# Patient Record
Sex: Female | Born: 1940 | Race: White | Hispanic: No | Marital: Married | Smoking: Never smoker
Health system: Southern US, Community
[De-identification: ages and names within clinical notes are randomized; demographics above are authoritative.]

## PROBLEM LIST (undated history)

## (undated) DIAGNOSIS — D3611 Benign neoplasm of peripheral nerves and autonomic nervous system of face, head, and neck: Secondary | ICD-10-CM

## (undated) DIAGNOSIS — Z923 Personal history of irradiation: Secondary | ICD-10-CM

## (undated) DIAGNOSIS — D126 Benign neoplasm of colon, unspecified: Secondary | ICD-10-CM

## (undated) DIAGNOSIS — M1990 Unspecified osteoarthritis, unspecified site: Secondary | ICD-10-CM

## (undated) DIAGNOSIS — K5790 Diverticulosis of intestine, part unspecified, without perforation or abscess without bleeding: Secondary | ICD-10-CM

## (undated) DIAGNOSIS — K5900 Constipation, unspecified: Secondary | ICD-10-CM

## (undated) DIAGNOSIS — K589 Irritable bowel syndrome without diarrhea: Secondary | ICD-10-CM

## (undated) DIAGNOSIS — Z87442 Personal history of urinary calculi: Secondary | ICD-10-CM

## (undated) DIAGNOSIS — H269 Unspecified cataract: Secondary | ICD-10-CM

## (undated) DIAGNOSIS — K760 Fatty (change of) liver, not elsewhere classified: Secondary | ICD-10-CM

## (undated) DIAGNOSIS — C50919 Malignant neoplasm of unspecified site of unspecified female breast: Secondary | ICD-10-CM

## (undated) DIAGNOSIS — T7840XA Allergy, unspecified, initial encounter: Secondary | ICD-10-CM

## (undated) DIAGNOSIS — I493 Ventricular premature depolarization: Secondary | ICD-10-CM

## (undated) DIAGNOSIS — K219 Gastro-esophageal reflux disease without esophagitis: Secondary | ICD-10-CM

## (undated) DIAGNOSIS — D2320 Other benign neoplasm of skin of unspecified ear and external auricular canal: Secondary | ICD-10-CM

## (undated) DIAGNOSIS — N390 Urinary tract infection, site not specified: Secondary | ICD-10-CM

## (undated) DIAGNOSIS — K317 Polyp of stomach and duodenum: Secondary | ICD-10-CM

## (undated) DIAGNOSIS — K209 Esophagitis, unspecified without bleeding: Secondary | ICD-10-CM

## (undated) DIAGNOSIS — J189 Pneumonia, unspecified organism: Secondary | ICD-10-CM

## (undated) DIAGNOSIS — I499 Cardiac arrhythmia, unspecified: Secondary | ICD-10-CM

## (undated) DIAGNOSIS — M8580 Other specified disorders of bone density and structure, unspecified site: Secondary | ICD-10-CM

## (undated) DIAGNOSIS — R002 Palpitations: Secondary | ICD-10-CM

## (undated) DIAGNOSIS — N200 Calculus of kidney: Secondary | ICD-10-CM

## (undated) DIAGNOSIS — I10 Essential (primary) hypertension: Secondary | ICD-10-CM

## (undated) DIAGNOSIS — E785 Hyperlipidemia, unspecified: Secondary | ICD-10-CM

## (undated) HISTORY — PX: ACOUSTIC NEUROMA RESECTION: SHX5713

## (undated) HISTORY — DX: Polyp of stomach and duodenum: K31.7

## (undated) HISTORY — DX: Esophagitis, unspecified without bleeding: K20.90

## (undated) HISTORY — PX: COLONOSCOPY W/ BIOPSIES: SHX1374

## (undated) HISTORY — DX: Benign neoplasm of colon, unspecified: D12.6

## (undated) HISTORY — DX: Other specified disorders of bone density and structure, unspecified site: M85.80

## (undated) HISTORY — DX: Hyperlipidemia, unspecified: E78.5

## (undated) HISTORY — DX: Benign neoplasm of peripheral nerves and autonomic nervous system of face, head, and neck: D36.11

## (undated) HISTORY — DX: Pneumonia, unspecified organism: J18.9

## (undated) HISTORY — DX: Palpitations: R00.2

## (undated) HISTORY — DX: Allergy, unspecified, initial encounter: T78.40XA

## (undated) HISTORY — PX: DILATION AND CURETTAGE OF UTERUS: SHX78

## (undated) HISTORY — DX: Calculus of kidney: N20.0

## (undated) HISTORY — DX: Constipation, unspecified: K59.00

## (undated) HISTORY — DX: Ventricular premature depolarization: I49.3

## (undated) HISTORY — DX: Essential (primary) hypertension: I10

## (undated) HISTORY — DX: Gastro-esophageal reflux disease without esophagitis: K21.9

## (undated) HISTORY — DX: Irritable bowel syndrome without diarrhea: K58.9

## (undated) HISTORY — PX: SHOULDER SURGERY: SHX246

## (undated) HISTORY — DX: Esophagitis, unspecified: K20.9

## (undated) HISTORY — DX: Unspecified osteoarthritis, unspecified site: M19.90

## (undated) HISTORY — DX: Diverticulosis of intestine, part unspecified, without perforation or abscess without bleeding: K57.90

## (undated) HISTORY — DX: Malignant neoplasm of unspecified site of unspecified female breast: C50.919

## (undated) HISTORY — PX: ESOPHAGOGASTRODUODENOSCOPY: SHX1529

## (undated) HISTORY — DX: Other benign neoplasm of skin of unspecified ear and external auricular canal: D23.20

## (undated) HISTORY — DX: Urinary tract infection, site not specified: N39.0

## (undated) HISTORY — PX: OTHER SURGICAL HISTORY: SHX169

## (undated) HISTORY — DX: Unspecified cataract: H26.9

---

## 1998-03-09 ENCOUNTER — Other Ambulatory Visit: Admission: RE | Admit: 1998-03-09 | Discharge: 1998-03-09 | Payer: Self-pay | Admitting: Gynecology

## 1998-03-15 ENCOUNTER — Other Ambulatory Visit: Admission: RE | Admit: 1998-03-15 | Discharge: 1998-03-15 | Payer: Self-pay | Admitting: Gynecology

## 1999-04-03 ENCOUNTER — Other Ambulatory Visit: Admission: RE | Admit: 1999-04-03 | Discharge: 1999-04-03 | Payer: Self-pay | Admitting: Gynecology

## 1999-09-10 ENCOUNTER — Encounter: Payer: Self-pay | Admitting: Gynecology

## 1999-09-13 ENCOUNTER — Encounter (INDEPENDENT_AMBULATORY_CARE_PROVIDER_SITE_OTHER): Payer: Self-pay | Admitting: Specialist

## 1999-09-13 ENCOUNTER — Ambulatory Visit (HOSPITAL_COMMUNITY): Admission: RE | Admit: 1999-09-13 | Discharge: 1999-09-13 | Payer: Self-pay | Admitting: Gynecology

## 2000-03-09 ENCOUNTER — Encounter: Admission: RE | Admit: 2000-03-09 | Discharge: 2000-03-09 | Payer: Self-pay | Admitting: Gynecology

## 2000-03-09 ENCOUNTER — Encounter: Payer: Self-pay | Admitting: Gynecology

## 2001-03-12 ENCOUNTER — Encounter: Admission: RE | Admit: 2001-03-12 | Discharge: 2001-03-12 | Payer: Self-pay | Admitting: Gynecology

## 2001-03-12 ENCOUNTER — Encounter: Payer: Self-pay | Admitting: Gynecology

## 2001-03-19 ENCOUNTER — Other Ambulatory Visit: Admission: RE | Admit: 2001-03-19 | Discharge: 2001-03-19 | Payer: Self-pay | Admitting: Gynecology

## 2001-04-08 ENCOUNTER — Other Ambulatory Visit: Admission: RE | Admit: 2001-04-08 | Discharge: 2001-04-08 | Payer: Self-pay | Admitting: Gynecology

## 2002-03-14 ENCOUNTER — Encounter: Payer: Self-pay | Admitting: Gynecology

## 2002-03-14 ENCOUNTER — Encounter: Admission: RE | Admit: 2002-03-14 | Discharge: 2002-03-14 | Payer: Self-pay | Admitting: Gynecology

## 2003-01-24 ENCOUNTER — Other Ambulatory Visit: Admission: RE | Admit: 2003-01-24 | Discharge: 2003-01-24 | Payer: Self-pay | Admitting: Gynecology

## 2003-03-17 ENCOUNTER — Encounter: Payer: Self-pay | Admitting: Gynecology

## 2003-03-17 ENCOUNTER — Encounter: Admission: RE | Admit: 2003-03-17 | Discharge: 2003-03-17 | Payer: Self-pay | Admitting: Gynecology

## 2003-12-16 ENCOUNTER — Emergency Department (HOSPITAL_COMMUNITY): Admission: EM | Admit: 2003-12-16 | Discharge: 2003-12-16 | Payer: Self-pay | Admitting: Emergency Medicine

## 2004-01-30 ENCOUNTER — Other Ambulatory Visit: Admission: RE | Admit: 2004-01-30 | Discharge: 2004-01-30 | Payer: Self-pay | Admitting: Gynecology

## 2004-04-11 ENCOUNTER — Encounter: Admission: RE | Admit: 2004-04-11 | Discharge: 2004-04-11 | Payer: Self-pay | Admitting: Gynecology

## 2004-04-16 ENCOUNTER — Encounter: Admission: RE | Admit: 2004-04-16 | Discharge: 2004-04-16 | Payer: Self-pay | Admitting: Gynecology

## 2004-10-24 ENCOUNTER — Ambulatory Visit: Payer: Self-pay | Admitting: Internal Medicine

## 2004-11-04 ENCOUNTER — Ambulatory Visit: Payer: Self-pay | Admitting: Gastroenterology

## 2004-11-26 ENCOUNTER — Encounter: Payer: Self-pay | Admitting: Internal Medicine

## 2004-11-26 ENCOUNTER — Ambulatory Visit: Payer: Self-pay | Admitting: Gastroenterology

## 2004-12-19 ENCOUNTER — Ambulatory Visit: Payer: Self-pay | Admitting: Internal Medicine

## 2004-12-26 ENCOUNTER — Ambulatory Visit: Payer: Self-pay | Admitting: Internal Medicine

## 2005-03-28 ENCOUNTER — Ambulatory Visit: Payer: Self-pay | Admitting: Internal Medicine

## 2005-04-04 ENCOUNTER — Ambulatory Visit: Payer: Self-pay | Admitting: Internal Medicine

## 2005-04-08 ENCOUNTER — Other Ambulatory Visit: Admission: RE | Admit: 2005-04-08 | Discharge: 2005-04-08 | Payer: Self-pay | Admitting: Gynecology

## 2005-04-14 ENCOUNTER — Encounter: Admission: RE | Admit: 2005-04-14 | Discharge: 2005-04-14 | Payer: Self-pay | Admitting: Gynecology

## 2005-06-12 ENCOUNTER — Ambulatory Visit: Payer: Self-pay | Admitting: Internal Medicine

## 2005-06-19 ENCOUNTER — Ambulatory Visit: Payer: Self-pay | Admitting: Internal Medicine

## 2005-08-04 ENCOUNTER — Ambulatory Visit: Payer: Self-pay | Admitting: Internal Medicine

## 2005-09-08 ENCOUNTER — Ambulatory Visit: Payer: Self-pay | Admitting: Internal Medicine

## 2005-09-24 ENCOUNTER — Ambulatory Visit: Payer: Self-pay | Admitting: Internal Medicine

## 2005-11-03 ENCOUNTER — Ambulatory Visit: Payer: Self-pay | Admitting: Internal Medicine

## 2005-11-10 ENCOUNTER — Ambulatory Visit: Payer: Self-pay | Admitting: Internal Medicine

## 2005-12-23 ENCOUNTER — Ambulatory Visit: Payer: Self-pay | Admitting: Internal Medicine

## 2006-04-09 ENCOUNTER — Ambulatory Visit: Payer: Self-pay | Admitting: Internal Medicine

## 2006-04-10 ENCOUNTER — Other Ambulatory Visit: Admission: RE | Admit: 2006-04-10 | Discharge: 2006-04-10 | Payer: Self-pay | Admitting: Gynecology

## 2006-04-14 ENCOUNTER — Other Ambulatory Visit: Admission: RE | Admit: 2006-04-14 | Discharge: 2006-04-14 | Payer: Self-pay | Admitting: Gynecology

## 2006-05-12 ENCOUNTER — Encounter: Admission: RE | Admit: 2006-05-12 | Discharge: 2006-05-12 | Payer: Self-pay | Admitting: Gynecology

## 2006-07-08 ENCOUNTER — Ambulatory Visit: Payer: Self-pay | Admitting: Internal Medicine

## 2006-07-15 ENCOUNTER — Ambulatory Visit: Payer: Self-pay | Admitting: Internal Medicine

## 2006-11-05 ENCOUNTER — Ambulatory Visit: Payer: Self-pay | Admitting: Internal Medicine

## 2007-01-07 ENCOUNTER — Ambulatory Visit: Payer: Self-pay | Admitting: Internal Medicine

## 2007-01-07 LAB — CONVERTED CEMR LAB
ALT: 14 U/L
AST: 20 U/L
Albumin: 3.7 g/dL
Alkaline Phosphatase: 67 U/L
Bilirubin, Direct: 0.4 mg/dL — ABNORMAL HIGH
Calcium: 9.3 mg/dL
Cholesterol: 166 mg/dL
Glucose, Bld: 86 mg/dL
HDL: 38.4 mg/dL — ABNORMAL LOW
LDL Cholesterol: 109 mg/dL — ABNORMAL HIGH
Potassium: 5.3 meq/L — ABNORMAL HIGH
TSH: 3.64 u[IU]/mL
Total Bilirubin: 0.7 mg/dL
Total CHOL/HDL Ratio: 4.3
Total Protein: 6.6 g/dL
Triglycerides: 95 mg/dL
VLDL: 19 mg/dL

## 2007-01-14 ENCOUNTER — Ambulatory Visit: Payer: Self-pay | Admitting: Internal Medicine

## 2007-03-19 ENCOUNTER — Ambulatory Visit: Payer: Self-pay | Admitting: Internal Medicine

## 2007-04-07 DIAGNOSIS — M949 Disorder of cartilage, unspecified: Secondary | ICD-10-CM

## 2007-04-07 DIAGNOSIS — M899 Disorder of bone, unspecified: Secondary | ICD-10-CM | POA: Insufficient documentation

## 2007-04-07 DIAGNOSIS — I10 Essential (primary) hypertension: Secondary | ICD-10-CM | POA: Insufficient documentation

## 2007-04-12 ENCOUNTER — Other Ambulatory Visit: Admission: RE | Admit: 2007-04-12 | Discharge: 2007-04-12 | Payer: Self-pay | Admitting: Gynecology

## 2007-05-14 ENCOUNTER — Encounter: Admission: RE | Admit: 2007-05-14 | Discharge: 2007-05-14 | Payer: Self-pay | Admitting: Gynecology

## 2007-05-24 ENCOUNTER — Ambulatory Visit: Payer: Self-pay | Admitting: Internal Medicine

## 2007-05-24 DIAGNOSIS — M5380 Other specified dorsopathies, site unspecified: Secondary | ICD-10-CM | POA: Insufficient documentation

## 2007-06-28 ENCOUNTER — Ambulatory Visit: Payer: Self-pay | Admitting: Internal Medicine

## 2007-06-28 LAB — CONVERTED CEMR LAB
ALT: 26 U/L
AST: 26 U/L
Albumin: 3.6 g/dL
Alkaline Phosphatase: 71 U/L
Bilirubin, Direct: 0.1 mg/dL
Cholesterol: 158 mg/dL
HDL: 31.4 mg/dL — ABNORMAL LOW
LDL Cholesterol: 103 mg/dL — ABNORMAL HIGH
Total Bilirubin: 0.7 mg/dL
Total CHOL/HDL Ratio: 5
Total Protein: 6.7 g/dL
Triglycerides: 119 mg/dL
VLDL: 24 mg/dL

## 2007-07-01 ENCOUNTER — Ambulatory Visit: Payer: Self-pay | Admitting: Internal Medicine

## 2007-07-01 DIAGNOSIS — E785 Hyperlipidemia, unspecified: Secondary | ICD-10-CM | POA: Insufficient documentation

## 2007-07-29 ENCOUNTER — Ambulatory Visit: Payer: Self-pay | Admitting: Internal Medicine

## 2007-08-03 ENCOUNTER — Telehealth: Payer: Self-pay | Admitting: Internal Medicine

## 2007-10-27 ENCOUNTER — Ambulatory Visit: Payer: Self-pay | Admitting: Internal Medicine

## 2007-10-27 LAB — CONVERTED CEMR LAB
ALT: 22 U/L
AST: 23 U/L
Albumin: 3.7 g/dL
Alkaline Phosphatase: 68 U/L
Bilirubin, Direct: 0.1 mg/dL
Cholesterol: 146 mg/dL
HDL: 32.8 mg/dL — ABNORMAL LOW
LDL Cholesterol: 95 mg/dL
Total Bilirubin: 0.8 mg/dL
Total CHOL/HDL Ratio: 4.5
Total Protein: 6.8 g/dL
Triglycerides: 89 mg/dL
VLDL: 18 mg/dL

## 2007-11-01 ENCOUNTER — Telehealth (INDEPENDENT_AMBULATORY_CARE_PROVIDER_SITE_OTHER): Payer: Self-pay | Admitting: *Deleted

## 2007-11-08 ENCOUNTER — Ambulatory Visit: Payer: Self-pay | Admitting: Internal Medicine

## 2007-11-08 LAB — CONVERTED CEMR LAB
Cholesterol, target level: 200 mg/dL
HDL goal, serum: 40 mg/dL
LDL Goal: 130 mg/dL

## 2008-02-10 ENCOUNTER — Ambulatory Visit: Payer: Self-pay | Admitting: Internal Medicine

## 2008-03-21 ENCOUNTER — Encounter: Payer: Self-pay | Admitting: Internal Medicine

## 2008-05-11 ENCOUNTER — Ambulatory Visit: Payer: Self-pay | Admitting: Internal Medicine

## 2008-05-14 ENCOUNTER — Encounter: Admission: RE | Admit: 2008-05-14 | Discharge: 2008-05-14 | Payer: Self-pay | Admitting: Internal Medicine

## 2008-05-16 ENCOUNTER — Encounter: Admission: RE | Admit: 2008-05-16 | Discharge: 2008-05-16 | Payer: Self-pay | Admitting: Gynecology

## 2008-05-18 ENCOUNTER — Telehealth: Payer: Self-pay | Admitting: Internal Medicine

## 2008-05-18 DIAGNOSIS — M75120 Complete rotator cuff tear or rupture of unspecified shoulder, not specified as traumatic: Secondary | ICD-10-CM | POA: Insufficient documentation

## 2008-05-29 ENCOUNTER — Telehealth: Payer: Self-pay | Admitting: Internal Medicine

## 2008-06-07 ENCOUNTER — Encounter: Payer: Self-pay | Admitting: Internal Medicine

## 2008-06-13 ENCOUNTER — Telehealth (INDEPENDENT_AMBULATORY_CARE_PROVIDER_SITE_OTHER): Payer: Self-pay | Admitting: *Deleted

## 2008-06-23 ENCOUNTER — Encounter: Admission: RE | Admit: 2008-06-23 | Discharge: 2008-07-21 | Payer: Self-pay | Admitting: Orthopedic Surgery

## 2008-07-28 ENCOUNTER — Ambulatory Visit: Payer: Self-pay | Admitting: Internal Medicine

## 2008-08-28 ENCOUNTER — Ambulatory Visit: Payer: Self-pay | Admitting: Internal Medicine

## 2008-08-28 DIAGNOSIS — M1990 Unspecified osteoarthritis, unspecified site: Secondary | ICD-10-CM | POA: Insufficient documentation

## 2008-11-15 ENCOUNTER — Ambulatory Visit: Payer: Self-pay | Admitting: Internal Medicine

## 2008-11-15 LAB — CONVERTED CEMR LAB
ALT: 18 U/L
AST: 21 U/L
Albumin: 3.6 g/dL
Alkaline Phosphatase: 76 U/L
BUN: 19 mg/dL
Bilirubin Urine: NEGATIVE
Bilirubin, Direct: 0.1 mg/dL
Blood in Urine, dipstick: NEGATIVE
CO2: 32 meq/L
Calcium: 9 mg/dL
Chloride: 106 meq/L
Cholesterol: 158 mg/dL
Creatinine, Ser: 0.9 mg/dL
GFR calc Af Amer: 81 mL/min
GFR calc non Af Amer: 67 mL/min
Glucose, Bld: 93 mg/dL
Glucose, Urine, Semiquant: NEGATIVE
HDL: 35.2 mg/dL — ABNORMAL LOW
Ketones, urine, test strip: NEGATIVE
LDL Cholesterol: 102 mg/dL — ABNORMAL HIGH
Nitrite: NEGATIVE
Potassium: 3.7 meq/L
Sodium: 144 meq/L
Specific Gravity, Urine: 1.02
Total Bilirubin: 0.7 mg/dL
Total CHOL/HDL Ratio: 4.5
Total Protein: 6.9 g/dL
Triglycerides: 105 mg/dL
Urobilinogen, UA: 0.2
VLDL: 21 mg/dL
WBC Urine, dipstick: NEGATIVE
pH: 6.5

## 2008-11-22 ENCOUNTER — Telehealth: Payer: Self-pay | Admitting: Internal Medicine

## 2008-11-27 ENCOUNTER — Ambulatory Visit: Payer: Self-pay | Admitting: Internal Medicine

## 2008-11-27 DIAGNOSIS — K219 Gastro-esophageal reflux disease without esophagitis: Secondary | ICD-10-CM | POA: Insufficient documentation

## 2008-11-28 ENCOUNTER — Ambulatory Visit: Payer: Self-pay | Admitting: Internal Medicine

## 2008-11-28 DIAGNOSIS — Z860100 Personal history of colon polyps, unspecified: Secondary | ICD-10-CM | POA: Insufficient documentation

## 2008-11-28 DIAGNOSIS — R1013 Epigastric pain: Secondary | ICD-10-CM | POA: Insufficient documentation

## 2008-11-28 LAB — CONVERTED CEMR LAB
ALT: 19 U/L
AST: 20 U/L
Albumin: 3.8 g/dL
Alkaline Phosphatase: 74 U/L
Basophils Absolute: 0 10*3/uL
Basophils Relative: 0.2 %
Bilirubin, Direct: 0.2 mg/dL
Eosinophils Absolute: 0 10*3/uL
Eosinophils Relative: 0.9 %
HCT: 38.5 %
Hemoglobin: 13.1 g/dL
Lymphocytes Relative: 28 %
MCHC: 34 g/dL
MCV: 88 fL
Monocytes Absolute: 0.4 10*3/uL
Monocytes Relative: 9.1 %
Neutro Abs: 3.1 10*3/uL
Neutrophils Relative %: 61.8 %
Platelets: 212 10*3/uL
RBC: 4.38 M/uL
RDW: 13.2 %
Total Bilirubin: 0.6 mg/dL
Total Protein: 7 g/dL
WBC: 4.9 10*3/uL

## 2008-11-29 ENCOUNTER — Ambulatory Visit: Payer: Self-pay | Admitting: Internal Medicine

## 2008-11-29 ENCOUNTER — Encounter: Payer: Self-pay | Admitting: Internal Medicine

## 2008-12-08 ENCOUNTER — Encounter: Payer: Self-pay | Admitting: Internal Medicine

## 2008-12-25 ENCOUNTER — Ambulatory Visit: Payer: Self-pay | Admitting: Internal Medicine

## 2008-12-26 ENCOUNTER — Ambulatory Visit: Payer: Self-pay | Admitting: Internal Medicine

## 2009-01-08 ENCOUNTER — Telehealth: Payer: Self-pay | Admitting: Internal Medicine

## 2009-01-09 ENCOUNTER — Ambulatory Visit: Payer: Self-pay | Admitting: Internal Medicine

## 2009-03-26 ENCOUNTER — Encounter: Payer: Self-pay | Admitting: Internal Medicine

## 2009-03-26 LAB — CONVERTED CEMR LAB: Pap Smear: NORMAL

## 2009-05-17 ENCOUNTER — Encounter: Admission: RE | Admit: 2009-05-17 | Discharge: 2009-05-17 | Payer: Self-pay | Admitting: Gynecology

## 2009-06-28 ENCOUNTER — Ambulatory Visit: Payer: Self-pay | Admitting: Internal Medicine

## 2009-06-28 LAB — CONVERTED CEMR LAB
ALT: 21 U/L
AST: 23 U/L
Albumin: 3.8 g/dL
Alkaline Phosphatase: 94 U/L
BUN: 14 mg/dL
Bilirubin, Direct: 0 mg/dL
CO2: 33 meq/L — ABNORMAL HIGH
Calcium: 9.3 mg/dL
Chloride: 106 meq/L
Cholesterol: 150 mg/dL
Creatinine, Ser: 0.9 mg/dL
GFR calc non Af Amer: 66.5 mL/min
Glucose, Bld: 102 mg/dL — ABNORMAL HIGH
HDL: 34.5 mg/dL — ABNORMAL LOW
LDL Cholesterol: 95 mg/dL
Potassium: 4.6 meq/L
Sodium: 146 meq/L — ABNORMAL HIGH
Total Bilirubin: 0.9 mg/dL
Total CHOL/HDL Ratio: 4
Total Protein: 7.5 g/dL
Triglycerides: 102 mg/dL
VLDL: 20.4 mg/dL

## 2009-07-05 ENCOUNTER — Ambulatory Visit: Payer: Self-pay | Admitting: Internal Medicine

## 2009-08-20 ENCOUNTER — Telehealth: Payer: Self-pay | Admitting: Internal Medicine

## 2009-08-28 ENCOUNTER — Telehealth: Payer: Self-pay | Admitting: Internal Medicine

## 2009-09-19 ENCOUNTER — Ambulatory Visit: Payer: Self-pay | Admitting: Internal Medicine

## 2009-09-19 DIAGNOSIS — R002 Palpitations: Secondary | ICD-10-CM | POA: Insufficient documentation

## 2009-09-26 ENCOUNTER — Ambulatory Visit: Payer: Self-pay | Admitting: Internal Medicine

## 2009-10-10 ENCOUNTER — Encounter: Payer: Self-pay | Admitting: Internal Medicine

## 2009-10-10 ENCOUNTER — Ambulatory Visit: Payer: Self-pay

## 2009-10-10 ENCOUNTER — Ambulatory Visit (HOSPITAL_COMMUNITY): Admission: RE | Admit: 2009-10-10 | Discharge: 2009-10-10 | Payer: Self-pay | Admitting: Internal Medicine

## 2009-10-10 ENCOUNTER — Ambulatory Visit: Payer: Self-pay | Admitting: Cardiovascular Disease

## 2009-10-29 ENCOUNTER — Telehealth: Payer: Self-pay | Admitting: Internal Medicine

## 2009-11-22 ENCOUNTER — Encounter (INDEPENDENT_AMBULATORY_CARE_PROVIDER_SITE_OTHER): Payer: Self-pay | Admitting: *Deleted

## 2009-12-05 ENCOUNTER — Encounter (INDEPENDENT_AMBULATORY_CARE_PROVIDER_SITE_OTHER): Payer: Self-pay | Admitting: *Deleted

## 2009-12-06 ENCOUNTER — Ambulatory Visit: Payer: Self-pay | Admitting: Internal Medicine

## 2009-12-19 ENCOUNTER — Ambulatory Visit: Payer: Self-pay | Admitting: Internal Medicine

## 2009-12-19 LAB — CONVERTED CEMR LAB
ALT: 17 U/L
AST: 19 U/L
Albumin: 3.7 g/dL
Alkaline Phosphatase: 95 U/L
BUN: 20 mg/dL
Basophils Absolute: 0 10*3/uL
Basophils Relative: 0.1 %
Bilirubin Urine: NEGATIVE
Bilirubin, Direct: 0.1 mg/dL
CO2: 30 meq/L
Calcium: 9.1 mg/dL
Chloride: 106 meq/L
Cholesterol: 128 mg/dL
Creatinine, Ser: 1.2 mg/dL
Eosinophils Absolute: 0 10*3/uL
Eosinophils Relative: 1.1 %
GFR calc non Af Amer: 47.64 mL/min
Glucose, Bld: 89 mg/dL
Glucose, Urine, Semiquant: NEGATIVE
HCT: 39.2 %
HDL: 40.7 mg/dL
Hemoglobin: 12.6 g/dL
Ketones, urine, test strip: NEGATIVE
LDL Cholesterol: 70 mg/dL
Lymphocytes Relative: 28.1 %
Lymphs Abs: 1.2 10*3/uL
MCHC: 32.2 g/dL
MCV: 92.6 fL
Monocytes Absolute: 0.5 10*3/uL
Monocytes Relative: 11.4 %
Neutro Abs: 2.6 10*3/uL
Neutrophils Relative %: 59.3 %
Nitrite: NEGATIVE
Platelets: 188 10*3/uL
Potassium: 4.2 meq/L
Protein, U semiquant: NEGATIVE
RBC: 4.24 M/uL
RDW: 13.6 %
Sodium: 144 meq/L
Specific Gravity, Urine: >=1.03
TSH: 1.86 u[IU]/mL
Total Bilirubin: 0.7 mg/dL
Total CHOL/HDL Ratio: 3
Total Protein: 7.4 g/dL
Triglycerides: 85 mg/dL
Urobilinogen, UA: 0.2
VLDL: 17 mg/dL
WBC Urine, dipstick: NEGATIVE
WBC: 4.3 10*3/uL — ABNORMAL LOW
pH: 5

## 2009-12-26 ENCOUNTER — Ambulatory Visit: Payer: Self-pay | Admitting: Internal Medicine

## 2009-12-26 LAB — HM MAMMOGRAPHY

## 2010-01-01 ENCOUNTER — Ambulatory Visit: Payer: Self-pay | Admitting: Internal Medicine

## 2010-01-01 LAB — HM COLONOSCOPY

## 2010-01-02 ENCOUNTER — Ambulatory Visit: Payer: Self-pay | Admitting: Internal Medicine

## 2010-01-07 ENCOUNTER — Telehealth: Payer: Self-pay | Admitting: Internal Medicine

## 2010-01-31 ENCOUNTER — Telehealth: Payer: Self-pay | Admitting: Internal Medicine

## 2010-03-04 ENCOUNTER — Telehealth: Payer: Self-pay | Admitting: Internal Medicine

## 2010-03-29 ENCOUNTER — Ambulatory Visit: Payer: Self-pay | Admitting: Internal Medicine

## 2010-03-29 DIAGNOSIS — N3020 Other chronic cystitis without hematuria: Secondary | ICD-10-CM | POA: Insufficient documentation

## 2010-04-25 ENCOUNTER — Encounter: Payer: Self-pay | Admitting: Internal Medicine

## 2010-05-28 ENCOUNTER — Encounter: Admission: RE | Admit: 2010-05-28 | Discharge: 2010-05-28 | Payer: Self-pay | Admitting: Gynecology

## 2010-05-30 ENCOUNTER — Encounter: Admission: RE | Admit: 2010-05-30 | Discharge: 2010-05-30 | Payer: Self-pay | Admitting: Gynecology

## 2010-06-11 ENCOUNTER — Encounter: Payer: Self-pay | Admitting: Internal Medicine

## 2010-07-12 ENCOUNTER — Ambulatory Visit: Payer: Self-pay | Admitting: Internal Medicine

## 2010-07-12 LAB — CONVERTED CEMR LAB
ALT: 18 U/L
AST: 19 U/L
Albumin: 3.7 g/dL
Alkaline Phosphatase: 87 U/L
BUN: 17 mg/dL
Bilirubin, Direct: 0.2 mg/dL
CO2: 32 meq/L
Calcium: 8.9 mg/dL
Chloride: 101 meq/L
Cholesterol: 147 mg/dL
Creatinine, Ser: 0.8 mg/dL
GFR calc non Af Amer: 73.81 mL/min
Glucose, Bld: 93 mg/dL
HDL: 38.2 mg/dL — ABNORMAL LOW
LDL Cholesterol: 83 mg/dL
Potassium: 3.8 meq/L
Sodium: 141 meq/L
Total Bilirubin: 0.8 mg/dL
Total CHOL/HDL Ratio: 4
Total Protein: 6.7 g/dL
Triglycerides: 128 mg/dL
VLDL: 25.6 mg/dL

## 2010-07-26 ENCOUNTER — Ambulatory Visit: Payer: Self-pay | Admitting: Internal Medicine

## 2010-09-17 ENCOUNTER — Telehealth: Payer: Self-pay | Admitting: Internal Medicine

## 2010-10-13 DIAGNOSIS — C50919 Malignant neoplasm of unspecified site of unspecified female breast: Secondary | ICD-10-CM | POA: Insufficient documentation

## 2010-10-13 DIAGNOSIS — D493 Neoplasm of unspecified behavior of breast: Secondary | ICD-10-CM | POA: Insufficient documentation

## 2010-10-15 ENCOUNTER — Telehealth: Payer: Self-pay | Admitting: Internal Medicine

## 2010-11-10 LAB — CONVERTED CEMR LAB
ALT: 22 U/L
AST: 22 U/L
Albumin: 4 g/dL
Alkaline Phosphatase: 68 U/L
BUN: 13 mg/dL
BUN: 16 mg/dL
Basophils Absolute: 0 10*3/uL
Basophils Relative: 0.5 %
Bilirubin, Direct: 0.1 mg/dL
CO2: 31 meq/L
CO2: 32 meq/L
Calcium: 9.2 mg/dL
Calcium: 9.4 mg/dL
Chloride: 103 meq/L
Chloride: 104 meq/L
Cholesterol: 159 mg/dL
Creatinine, Ser: 0.9 mg/dL
Creatinine, Ser: 1 mg/dL
Eosinophils Absolute: 0 10*3/uL
Eosinophils Relative: 0.8 %
Free T4: 1 ng/dL
GFR calc Af Amer: 72 mL/min
GFR calc non Af Amer: 59 mL/min
GFR calc non Af Amer: 66.45 mL/min
Glucose, Bld: 96 mg/dL
Glucose, Bld: 99 mg/dL
HCT: 41.6 %
HDL: 35.8 mg/dL — ABNORMAL LOW
Hemoglobin: 13.8 g/dL
LDL Cholesterol: 99 mg/dL
Lymphocytes Relative: 31.9 %
MCHC: 33.3 g/dL
MCV: 91.7 fL
Magnesium: 2.3 mg/dL
Monocytes Absolute: 0.4 10*3/uL
Monocytes Relative: 8.9 %
Neutro Abs: 3 10*3/uL
Neutrophils Relative %: 57.9 %
Platelets: 222 10*3/uL
Potassium: 3.3 meq/L — ABNORMAL LOW
Potassium: 3.9 meq/L
RBC: 4.53 M/uL
RDW: 13 %
Sodium: 143 meq/L
Sodium: 144 meq/L
T3, Free: 3.2 pg/mL
TSH: 2.02 u[IU]/mL
TSH: 2.52 u[IU]/mL
Total Bilirubin: 0.8 mg/dL
Total CHOL/HDL Ratio: 4.4
Total Protein: 7.4 g/dL
Triglycerides: 122 mg/dL
VLDL: 24 mg/dL
WBC: 5 10*3/uL

## 2010-11-12 NOTE — Progress Notes (Signed)
Summary: REFILL REQ  Phone Note Call from Patient   Caller: Patient [phone]) Reason for Call: Refill Medication Summary of Call: Pt called to req that a refill for med (LISINO-HCTZ 20-25MG  TAB) be sent into [facility] # [phone]...Marland KitchenMarland KitchenMarland Kitchen Pt can be reached @  [phone]  or  [phone]  with any questions or concerns.  Initial call taken by: Debbra Riding,  October 29, 2009 8:14 AM    Prescriptions: LISINOPRIL-HYDROCHLOROTHIAZIDE 20-25 MG  TABS (LISINOPRIL-HYDROCHLOROTHIAZIDE) once daily  #90 x 3   Entered by:   Willy Eddy, LPN   Authorized by:   Stacie Glaze MD   Signed by:   Willy Eddy, LPN on 28/41/3244   Method used:   Electronically to        [facility]* (mail-order)             ,          Ph: [phone]       Fax: [phone]   RxID:   [id number]

## 2010-11-12 NOTE — Consult Note (Signed)
 Summary: GOC note  GOC note   Imported By: Doyal Daring 06/09/2008 10:15:58  _____________________________________________________________________  External Attachment:    Type:   Image     Comment:   GOC note

## 2010-11-12 NOTE — Progress Notes (Signed)
 Summary: Atenolol  50 mg.  Phone Note Call from Patient   Caller: Patient Call For: Alexandra FORBES Budge MD Summary of Call: Pt is calling to give Dr. Budge her BP reading.  The lowest BP was 116/55 and lowest pulse 55. States heart has calmed down some.   If to continue same meds, will need refill to [facility]. [phone] Initial call taken by: Marissa Shropshire CMA,  August 28, 2009 9:04 AM  Follow-up for Phone Call        if she is on the 25 mg may  send in with change in the med list ( i beleve the last phone note suggest this but the med list was not changed) Follow-up by: Alexandra FORBES Budge MD,  August 28, 2009 1:54 PM  Additional Follow-up for Phone Call Additional follow up Details #1::        Spoke to pt.  She is on Atenolol  50 mg. and per Dr. Budge.........SABRAneeds to stay on this. Additional Follow-up by: Marissa Shropshire CMA,  August 28, 2009 2:08 PM    Prescriptions: ATENOLOL  50 MG  TABS (ATENOLOL ) once daily  #90 x 3   Entered by:   Marissa Shropshire CMA   Authorized by:   Alexandra FORBES Budge MD   Signed by:   Marissa Shropshire CMA on 08/28/2009   Method used:   Electronically to        [facility]* (mail-order)             ,          Ph: [phone]       Fax: [phone]   RxID:   [id number]

## 2010-11-12 NOTE — Assessment & Plan Note (Signed)
 Summary: 1 MONTH ROV/NJR   Vital Signs:  Patient profile:   70 year old female Height:      66 inches Weight:      180 pounds BMI:     29.16 Temp:     98.4 degrees F oral Pulse rate:   76 / minute Resp:     14 per minute BP sitting:   140 / 80  (left arm)  Vitals Entered By: Kendell CHRISTELLA Pouch, LPN (December 26, 2008 11:34 AM) CC: f/u from gerd- carafate  helped and pt completed- was told to stop by dr avram wh o saw her yesterday for egd an d rx'd her with omeprazole , Hypertension Management Is Patient Diabetic? No Pain Assessment Patient in pain? no        Primary Care Provider:  Mavis  CC:  f/u from gerd- carafate  helped and pt completed- was told to stop by dr avram wh o saw her yesterday for egd an d rx'd her with omeprazole .  History of Present Illness: Saw Dr avram and was placed on the  prilosec  and stopped the carafate  the EGD  and the consultant's note was reviewed ( had GERDWITH GASTRITIS AND ESPHAGITIS)   Hypertension History:      She denies headache, chest pain, palpitations, dyspnea with exertion, orthopnea, PND, peripheral edema, visual symptoms, neurologic problems, syncope, and side effects from treatment.        Positive major cardiovascular risk factors include female age 53 years old or older, hyperlipidemia, and hypertension.  Negative major cardiovascular risk factors include negative family history for ischemic heart disease and non-tobacco-user status.     Problems Prior to Update: 1)  Adenocarcinoma, Colon, Family Hx  (ICD-V16.0) 2)  Colonic Polyps, Adenomatous, Hx of  (ICD-V12.72) 3)  Gerd  (ICD-530.81) 4)  Abdominal Pain, Epigastric  (ICD-789.06) 5)  Gerd  (ICD-530.81) 6)  Osteoarthritis  (ICD-715.90) 7)  Rotator Cuff Tear  (ICD-727.61) 8)  Preventive Health Care  (ICD-V70.0) 9)  Bursitis, Right Shoulder  (ICD-726.10) 10)  Hyperlipidemia  (ICD-272.4) 11)  Hyperlipidemia Nec/nos  (ICD-272.4) 12)  Sprain/strain, Ankle, Calcaneofibular   (ICD-845.02) 13)  Muscle Spasm, Back  (ICD-724.8) 14)  Osteopenia  (ICD-733.90) 15)  Hypertension  (ICD-401.9)  Medications Prior to Update: 1)  Adult Aspirin Ec Low Strength 81 Mg  Tbec (Aspirin) .... Once Daily 2)  Lisinopril-Hydrochlorothiazide  20-25 Mg  Tabs (Lisinopril-Hydrochlorothiazide ) .... Once Daily 3)  Atenolol  50 Mg  Tabs (Atenolol ) .... Once Daily 4)  Zocor  20 Mg  Tabs (Simvastatin ) .... One By Mouth Daily 5)  Liquid Calcium /vitamin D  600-200 Mg-Unit  Caps (Calcium  Carbonate-Vitamin D ) .... 600 Per Day 6)  Trimethoprim  100 Mg  Tabs (Trimethoprim ) .... Once Daily 7)  D 1000 1000 Unit Caps (Cholecalciferol) .... One By Mouth Bid 8)  Omeprazole  20 Mg Cpdr (Omeprazole ) .SABRA.. 1 By Mouth 30 Minutes Before Meals  Current Medications (verified): 1)  Adult Aspirin Ec Low Strength 81 Mg  Tbec (Aspirin) .... Once Daily 2)  Lisinopril-Hydrochlorothiazide  20-25 Mg  Tabs (Lisinopril-Hydrochlorothiazide ) .... Once Daily 3)  Atenolol  50 Mg  Tabs (Atenolol ) .... Once Daily 4)  Zocor  20 Mg  Tabs (Simvastatin ) .... One By Mouth Daily 5)  Liquid Calcium /vitamin D  600-200 Mg-Unit  Caps (Calcium  Carbonate-Vitamin D ) .... 600 Per Day 6)  Trimethoprim  100 Mg  Tabs (Trimethoprim ) .... Once Daily 7)  D 1000 1000 Unit Caps (Cholecalciferol) .... One By Mouth Bid 8)  Omeprazole  20 Mg Cpdr (Omeprazole ) .SABRA.. 1 By Mouth 30 Minutes Before Meals  Allergies (verified): No Known Drug Allergies  Past History:  Family History:    father had Hodgkins    mother had colon cancer    Family History of Diabetes: GM, Aunt    Family History of Colon Polyps: Daughter     (12/25/2008)  Social History:    Married    Never Smoked    Occupation: Semi Retired    Alcohol Use - no    Illicit Drug Use - no    Patient does not get regular exercise.      (11/28/2008)  Risk Factors:    Alcohol Use: N/A    >5 drinks/d w/in last 3 months: N/A    Caffeine Use: N/A    Diet: N/A    Exercise: no  (11/28/2008)  Risk Factors:    Smoking Status: never (05/24/2007)    Packs/Day: N/A    Cigars/wk: N/A    Pipe Use/wk: N/A    Cans of tobacco/wk: N/A    Passive Smoke Exposure: no (11/08/2007)  Past medical, surgical, family and social histories (including risk factors) reviewed, and no changes noted (except as noted below).  Past Medical History:    Reviewed history from 11/28/2008 and no changes required:    Hypertension    Osteopenia    Palpitation    Hyperlipidemia  diet controlled    Osteoarthritis    GERD    Diverticulosis    Kidney Stones    Pneumonia    Urinary Tract Infection  Past Surgical History:    Reviewed history from 12/25/2008 and no changes required:    kidney stones    D and C x 3    shoulder arthroscopy right  Family History:    Reviewed history from 12/25/2008 and no changes required:       father had Hodgkins       mother had colon cancer       Family History of Diabetes: GM, Aunt       Family History of Colon Polyps: Daughter  Social History:    Reviewed history from 11/28/2008 and no changes required:       Married       Never Smoked       Occupation: Semi Retired       Alcohol Use - no       Illicit Drug Use - no       Patient does not get regular exercise.   Review of Systems  The patient denies anorexia, fever, weight loss, weight gain, vision loss, decreased hearing, hoarseness, chest pain, syncope, dyspnea on exertion, peripheral edema, prolonged cough, headaches, hemoptysis, abdominal pain, melena, hematochezia, severe indigestion/heartburn, hematuria, incontinence, genital sores, muscle weakness, suspicious skin lesions, transient blindness, difficulty walking, depression, unusual weight change, abnormal bleeding, enlarged lymph nodes, angioedema, and breast masses.    Physical Exam  General:  Well developed, well nourished, no acute distress. Head:  Normocephalic and atraumatic. Eyes:  PERRLA, no icterus. Ears:  no external  deformities.   Nose:  External nasal examination shows no deformity or inflammation. Nasal mucosa are pink and moist without lesions or exudates. Mouth:  No deformity or lesions, dentition normal. Neck:  Supple; no masses or thyromegaly. Lungs:  Clear throughout to auscultation. Heart:  Regular rate and rhythm; no murmurs, rubs,  or bruits. Abdomen:  Soft, nontender and nondistended. No masses, hepatosplenomegaly or hernias noted. Normal bowel sounds.   Impression & Recommendations:  Problem # 1:  OSTEOPENIA (ICD-733.90)  improved bone density and discussion  of vitamin D  and  calcium   Discussed medication use, applications of heat or ice, and exercises.   Problem # 2:  HYPERLIPIDEMIA (ICD-272.4)  Her updated medication list for this problem includes:    Zocor  20 Mg Tabs (Simvastatin ) ..... One by mouth daily  Labs Reviewed: Chol: 158 (11/15/2008)   HDL: 35.2 (11/15/2008)   LDL: 102 (11/15/2008)   TG: 105 (11/15/2008) SGOT: 20 (11/27/2008)   SGPT: 19 (11/27/2008)  Lipid Goals: Chol Goal: 200 (11/08/2007)   HDL Goal: 40 (11/08/2007)   LDL Goal: 130 (11/08/2007)   TG Goal: 150 (11/08/2007)  10 Yr Risk Heart Disease: 17 % Prior 10 Yr Risk Heart Disease: 13 % (11/27/2008)  Problem # 3:  HYPERTENSION (ICD-401.9) may take an extra 1/2 atenolol  for palpitations Her updated medication list for this problem includes:    Lisinopril-hydrochlorothiazide  20-25 Mg Tabs (Lisinopril-hydrochlorothiazide ) ..... Once daily    Atenolol  50 Mg Tabs (Atenolol ) ..... Once daily  BP today: 140/80 Prior BP: 116/60 (12/25/2008)  10 Yr Risk Heart Disease: 17 % Prior 10 Yr Risk Heart Disease: 13 % (11/27/2008)  Labs Reviewed: Creat: 0.9 (11/15/2008) Chol: 158 (11/15/2008)   HDL: 35.2 (11/15/2008)   LDL: 102 (11/15/2008)   TG: 105 (11/15/2008)  Complete Medication List: 1)  Adult Aspirin Ec Low Strength 81 Mg Tbec (Aspirin) .... Once daily 2)  Lisinopril-hydrochlorothiazide  20-25 Mg Tabs  (Lisinopril-hydrochlorothiazide ) .... Once daily 3)  Atenolol  50 Mg Tabs (Atenolol ) .... Once daily 4)  Zocor  20 Mg Tabs (Simvastatin ) .... One by mouth daily 5)  Liquid Calcium /vitamin D  600-200 Mg-unit Caps (Calcium  carbonate-vitamin d ) .... 600 per day 6)  Trimethoprim  100 Mg Tabs (Trimethoprim ) .... Once daily 7)  D 1000 1000 Unit Caps (Cholecalciferol) .... One by mouth bid 8)  Omeprazole  20 Mg Cpdr (Omeprazole ) .SABRA.. 1 by mouth 30 minutes before meals  Hypertension Assessment/Plan:      The patient's hypertensive risk group is category B: At least one risk factor (excluding diabetes) with no target organ damage.  Her calculated 10 year risk of coronary heart disease is 17 %.  Today's blood pressure is 140/80.  Her blood pressure goal is < 140/90.  Patient Instructions: 1)  may take 1/2 atenolol  for extra heart beats 2)  Please schedule a follow-up appointment in 6 months. 3)  BMP prior to visit, ICD-9:  401.90 4)  Hepatic Panel prior to visit, ICD-9:995.20 5)  Lipid Panel prior to visit, ICD-9:272.4

## 2010-11-12 NOTE — Consult Note (Signed)
 Summary: Dr Cary note  Dr Cary note   Imported By: Doyal Daring 03/24/2008 12:56:59  _____________________________________________________________________  External Attachment:    Type:   Image     Comment:   Dr Cary note

## 2010-11-12 NOTE — Assessment & Plan Note (Signed)
 Summary: SEVERE HEART PALPITATIONS//SLM   Vital Signs:  Patient profile:   70 year old female Height:      66 inches Weight:      177 pounds BMI:     28.67 Temp:     98.3 degrees F oral Pulse rate:   62 / minute Pulse rhythm:   regular Resp:     14 per minute BP sitting:   124 / 80  (left arm)  Vitals Entered By: Kendell CHRISTELLA Pouch, LPN (September 19, 2009 12:04 PM) CC: c/o heart palpatations- frequently after eating breakfast,although she drinks decaf coffee   Primary Care Provider:  Mavis  CC:  c/o heart palpatations- frequently after eating breakfast and although she drinks decaf coffee.  History of Present Illness: Pt with hx of palpitations, never documented. She states that the frequency has slightly increased. the symptoms are never sustained and are not associated with SOB, diaphoresis, nausea, or dizzyness She felt that she had them today and called for an office visit. the EKG today is normal. Of note is that her husband had ablation for AF. 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) 3)  Tick Bite  (ICD-E906.4) 4)  Adenocarcinoma, Colon, Family Hx  (ICD-V16.0) 5)  Colonic Polyps, Adenomatous, Hx of  (ICD-V12.72) 6)  Gerd  (ICD-530.81) 7)  Abdominal Pain, Epigastric  (ICD-789.06) 8)  Gerd  (ICD-530.81) 9)  Osteoarthritis  (ICD-715.90) 10)  Rotator Cuff Tear  (ICD-727.61) 11)  Preventive Health Care  (ICD-V70.0) 12)  Bursitis, Right Shoulder  (ICD-726.10) 13)  Hyperlipidemia  (ICD-272.4) 14)  Hyperlipidemia Nec/nos  (ICD-272.4) 15)  Sprain/strain, Ankle, Calcaneofibular  (ICD-845.02) 16)  Muscle Spasm, Back  (ICD-724.8) 17)  Osteopenia  (ICD-733.90) 18)  Hypertension  (ICD-401.9)  Medications Prior to Update: 1)  Adult Aspirin Ec Low  Strength 81 Mg  Tbec (Aspirin) .... Once Daily 2)  Lisinopril-Hydrochlorothiazide  20-25 Mg  Tabs (Lisinopril-Hydrochlorothiazide ) .... Once Daily 3)  Atenolol  50 Mg  Tabs (Atenolol ) .... Once Daily 4)  Zocor  20 Mg  Tabs (Simvastatin ) .... One By Mouth Daily 5)  Trimethoprim  100 Mg  Tabs (Trimethoprim ) .... Once Daily 6)  Vitamin D  2000 Unit Tabs (Cholecalciferol) .SABRA.. 1 Once Daily 7)  Omeprazole  20 Mg Cpdr (Omeprazole ) .SABRA.. 1 By Mouth 30 Minutes Before Meals  Current Medications (verified): 1)  Adult Aspirin Ec Low Strength 81 Mg  Tbec (Aspirin) .... Once Daily 2)  Lisinopril-Hydrochlorothiazide  20-25 Mg  Tabs (Lisinopril-Hydrochlorothiazide ) .... Once Daily 3)  Atenolol  50 Mg  Tabs (Atenolol ) .... Once Daily 4)  Zocor  20 Mg  Tabs (Simvastatin ) .... One By Mouth Daily 5)  Trimethoprim  100 Mg  Tabs (Trimethoprim ) .... Once Daily 6)  Vitamin D  2000 Unit Tabs (Cholecalciferol) .SABRA.. 1 Once Daily 7)  Omeprazole  20 Mg Cpdr (Omeprazole ) .SABRA.. 1 By Mouth 30 Minutes Before Meals  Allergies (verified): No Known Drug Allergies  Past History:  Family History: Last updated: 12/25/2008 father had Hodgkins mother had colon cancer Family History of Diabetes: GM, Aunt Family History of Colon Polyps: Daughter  Social History: Last updated: 11/28/2008 Married Never Smoked Occupation: Semi Retired Alcohol Use - no Illicit Drug Use - no Patient does not get regular exercise.   Risk Factors: Exercise: no (11/28/2008)  Risk Factors: Smoking Status: never (09/19/2009) Passive Smoke Exposure:  no (11/08/2007)  Past medical, surgical, family and social histories (including risk factors) reviewed, and no changes noted (except as noted below).  Past Medical History: Reviewed history from 11/28/2008 and no changes required. Hypertension Osteopenia Palpitation Hyperlipidemia  diet controlled Osteoarthritis GERD Diverticulosis Kidney Stones Pneumonia Urinary Tract Infection  Past Surgical  History: Reviewed history from 12/25/2008 and no changes required. kidney stones D and C x 3 shoulder arthroscopy right  Family History: Reviewed history from 12/25/2008 and no changes required. father had Hodgkins mother had colon cancer Family History of Diabetes: GM, Aunt Family History of Colon Polyps: Daughter  Social History: Reviewed history from 11/28/2008 and no changes required. Married Never Smoked Occupation: Semi Retired Alcohol Use - no Illicit Drug Use - no Patient does not get regular exercise.   Review of Systems  The patient denies anorexia, fever, weight loss, weight gain, vision loss, decreased hearing, hoarseness, chest pain, syncope, dyspnea on exertion, peripheral edema, prolonged cough, headaches, hemoptysis, abdominal pain, melena, hematochezia, severe indigestion/heartburn, hematuria, incontinence, genital sores, muscle weakness, suspicious skin lesions, transient blindness, difficulty walking, depression, unusual weight change, abnormal bleeding, enlarged lymph nodes, angioedema, and breast masses.         palpitatons  Physical Exam  General:  Well developed, well nourished, no acute distress. Head:  Normocephalic and atraumatic. Eyes:  pupils equal and pupils round.   Ears:  no external deformities.  R ear normal and L ear normal.   Nose:  External nasal examination shows no deformity or inflammation. Nasal mucosa are pink and moist without lesions or exudates. Neck:  Supple; no masses or thyromegaly. Lungs:  Clear throughout to auscultation. Heart:  Regular rate and rhythm; no murmurs, rubs,  or bruits. Abdomen:  Soft, nontender and nondistended. No masses, hepatosplenomegaly or hernias noted. Normal bowel sounds. Msk:  normal ROM and no joint tenderness.   Psych:  Oriented X3 and slightly anxious.     Impression & Recommendations:  Problem # 1:  PALPITATIONS (ICD-785.1) will moniter labs to see if any electrolyte or endocrine issues  exist. at her request will refer to Dr Orrin for a consultation and consideration for a holter or event moniter. the frequency  increase is the only change Her updated medication list for this problem includes:    Atenolol  50 Mg Tabs (Atenolol ) ..... Once daily  Orders: EKG w/ Interpretation (93000) TLB-Magnesium (Mg) (83735-MG) TLB-TSH (Thyroid  Stimulating Hormone) (84443-TSH) TLB-T4 (Thyrox), Free [phone]) TLB-T3, Free (Triiodothyronine) (84481-T3FREE) Cardiology Referral (Cardiology)  Avoid caffeine, chocolate, and stimulants. Stress reduction as well as medication options discussed.   Problem # 2:  HYPERTENSION (ICD-401.9)  Her updated medication list for this problem includes:    Lisinopril-hydrochlorothiazide  20-25 Mg Tabs (Lisinopril-hydrochlorothiazide ) ..... Once daily    Atenolol  50 Mg Tabs (Atenolol ) ..... Once daily  Orders: Venipuncture (63584) TLB-BMP (Basic Metabolic Panel-BMET) (80048-METABOL) TLB-Calcium  (82310-CA) TLB-Magnesium (Mg) (83735-MG)  BP today: 124/80 Prior BP: 132/80 (07/05/2009)  Prior 10 Yr Risk Heart Disease: 13 % (07/05/2009)  Labs Reviewed: K+: 4.6 (06/28/2009) Creat: : 0.9 (06/28/2009)   Chol: 150 (06/28/2009)   HDL: 34.50 (06/28/2009)   LDL: 95 (06/28/2009)   TG: 102.0 (06/28/2009)  Complete Medication List: 1)  Adult Aspirin Ec Low Strength 81 Mg Tbec (Aspirin) .... Once daily 2)  Lisinopril-hydrochlorothiazide  20-25 Mg Tabs (Lisinopril-hydrochlorothiazide ) .... Once daily 3)  Atenolol  50 Mg Tabs (Atenolol ) .... Once daily 4)  Zocor  20 Mg Tabs (Simvastatin ) .... One by mouth daily 5)  Trimethoprim  100 Mg Tabs (Trimethoprim ) .... Once daily  6)  Vitamin D  2000 Unit Tabs (Cholecalciferol) .SABRA.. 1 once daily 7)  Omeprazole  20 Mg Cpdr (Omeprazole ) .SABRA.. 1 by mouth 30 minutes before meals  Patient Instructions: 1)  you will be referred to cardiolgy

## 2010-11-12 NOTE — Progress Notes (Signed)
 Summary: Lab result request  Phone Note Call from Patient Call back at Home Phone [phone]   Caller: Patient Call For: Mavis Reason for Call: Talk to Doctor Summary of Call: Pt requesting results of lab work done on 7/30. Initial call taken by: Inocente Gallon LPN,  May 29, 2008 4:11 PM  Follow-up for Phone Call        pt informed wnl  Follow-up by: Kendell CHRISTELLA Pouch, LPN,  May 29, 2008 4:42 PM

## 2010-11-12 NOTE — Progress Notes (Signed)
 Summary: pt req increased dosage of Atenolol    Phone Note Call from Patient Call back at Home Phone [phone] Call back at Work Phone [phone]   Caller: Patient Summary of Call: Pt is having palpitations and is wondering if her Atenolol  needs to be increased or not.  Pt didnt know if she would need an appt or not, in order to do this.  Initial call taken by: Niels Misty,  August 20, 2009 9:52 AM  Follow-up for Phone Call        may cut the atenolol  in 1/2 and moniter HR and Blood pressure Follow-up by: Norleen FORBES Budge MD,  August 20, 2009 10:10 AM  Additional Follow-up for Phone Call Additional follow up Details #1::        LMTCB  Pt notified. Marissa Shropshire CMA  August 20, 2009 12:55 PM  Additional Follow-up by: Marissa Shropshire CMA,  August 20, 2009 10:14 AM

## 2010-11-12 NOTE — Progress Notes (Signed)
 Summary[facility] Alexandra Calderon  Phone Note Other Incoming   Call placed by: Zada from [facility] Call placed to: medical records Summary of Call: Alexandra Calderon need a copy of Alexandra Calderon stress test report and echocardiogram report faxed to [facility]. Initial call taken by: Alexandra Calderon,  June 13, 2008 3:42 PM  Follow-up for Phone Call        Phone call completed,faxed echocardiogram report and Stress test report to [facility]. Follow-up by: Alexandra Calderon,  June 13, 2008 3:46 PM

## 2010-11-12 NOTE — Assessment & Plan Note (Signed)
 Summary: 1 MO F-UP EGD/FH    History of Present Illness Primary GI MD:  Lupita Commander MD [facility] Primary MD:  Mavis Chief Complaint:  Alexandra Calderon         Chief Complaint:  Alexandra Calderon.  History of Present Illness: Stopped carafate  as bad chest burning is gone (mostly). Some indigestion mainlly a burp needing to come up. No distinct burning. Still off Boniva.    Current Medications (verified): 1)  Adult Aspirin Ec Low Strength 81 Mg  Tbec (Aspirin) .... Once Daily 2)  Lisinopril-Hydrochlorothiazide  20-25 Mg  Tabs (Lisinopril-Hydrochlorothiazide ) .... Once Daily 3)  Atenolol  50 Mg  Tabs (Atenolol ) .... Once Daily 4)  Zocor  20 Mg  Tabs (Simvastatin ) .... One By Mouth Daily 5)  Liquid Calcium /vitamin Calderon  600-200 Mg-Unit  Caps (Calcium  Carbonate-Vitamin Calderon ) .... 600 Per Day 6)  Trimethoprim  100 Mg  Tabs (Trimethoprim ) .... Once Daily 7)  Calderon 1000 1000 Unit Caps (Cholecalciferol) .... One By Mouth Bid 8)  Prilosec Otc 20 Mg Tbec (Omeprazole  Magnesium) .... Take 1 Tablet By Mouth Once A Day  Allergies (verified): No Known Drug Allergies  Past History:  Past Medical History:    Reviewed history from 11/28/2008 and no changes required:    Hypertension    Osteopenia    Palpitation    Hyperlipidemia  diet controlled    Osteoarthritis    GERD    Diverticulosis    Kidney Stones    Pneumonia    Urinary Tract Infection  Past Surgical History:    kidney stones    Calderon and C x 3    shoulder arthroscopy right  Family History:    father had Hodgkins    mother had colon cancer    Family History of Diabetes: GM, Aunt    Family History of Colon Polyps: Daughter  Social History:    Reviewed history from 11/28/2008 and no changes required:       Married       Never Smoked       Occupation: Semi Retired       Alcohol Use - no       Illicit Drug Use - no       Patient does not get regular exercise.   Vital Signs:  Patient profile:   70 year old female Height:      66 inches Weight:       180.38 pounds BMI:     29.22 BSA:     1.92 Pulse rate:   60 / minute Pulse rhythm:   regular BP sitting:   116 / 60  (left arm)  Vitals Entered By: Alwyn Moose CMA (December 25, 2008 10:11 AM)   Impression & Recommendations:  Problem # 1:  GERD (ICD-530.81) Assessment Improved I think problems lately were due to a flare of this. Suspect she needs chronic PPI. Could and would rechallenge with Boniva if clinically warranted.  Not sure why GERD flared, ? if related to a certain meal with salmon she relates it to. She was reassured overall and will Rx omeprazole  from [facility].  Problem # 2:  OSTEOPENIA (ICD-733.90) Assessment: Comment Only To discuss therapy and follow-up with Dr. Mavis.  15 minutes time spent > half in counselling.   Patient Instructions: 1)  Please continue current medications.  2)  When the omeprazole  arrives, use that instead of Prilosec OTC 3)  If your heartburn and GERD problems recur get worse, return to see me.  4)  You need to discuss whether or not you need Boniva with Dr. Mavis. I think it is ok to retry it as we do not know it caused this flare of GERD. If you have problems again, it would be more likely to be the cause. 5)  Omeprazole  and drugs like it may cause osteoporosis but you need to take it for your GERD. 6)  Copy sent to : Norleen Mavis, MD; Carlin Forbes, MD 7)  The medication list was reviewed and reconciled.  All changed / newly prescribed medications were explained.  A complete medication list was provided to the patient / caregiver. Prescriptions: OMEPRAZOLE  20 MG CPDR (OMEPRAZOLE ) 1 by mouth 30 minutes before meals  #90 x 3   Entered and Authorized by:   Lupita FORBES Commander MD   Signed by:   Lupita FORBES Commander MD on 12/25/2008   Method used:   Electronically to        [facility]* (mail-order)             ,          Ph: [phone]       Fax: [phone]   RxID:   [id number] PRILOSEC OTC 20 MG TBEC (OMEPRAZOLE  MAGNESIUM) Take 1  tablet by mouth once a day 30 minutes before breakfast  #90 x 3   Entered and Authorized by:   Lupita FORBES Commander MD   Signed by:   Lupita FORBES Commander MD on 12/25/2008   Method used:   Electronically to        [facility]* (mail-order)             ,          Ph: [phone]       Fax: [phone]   RxID:   [id number]

## 2010-11-12 NOTE — Progress Notes (Signed)
Summary: REQ FOR MED  Phone Note Call from Patient   Caller: Patient  [phone] Reason for Call: Refill Medication Summary of Call: Pt would like to have a Rx refill for med: Simvastatin 20mg ...... Pt would like Rx sent to [facility].  Initial call taken by: Debbra Riding,  January 07, 2010 8:55 AM    Prescriptions: ZOCOR 20 MG  TABS (SIMVASTATIN) one by mouth daily  #90 x 3   Entered by:   Willy Eddy, LPN   Authorized by:   Stacie Glaze MD   Signed by:   Willy Eddy, LPN on 09/81/1914   Method used:   Electronically to        [facility]* (mail-order)             ,          Ph: [phone]       Fax: [phone]   RxID:   [id number]

## 2010-11-12 NOTE — Assessment & Plan Note (Signed)
 Summary: nep/PAF  Medications Added TRIMETHOPRIM  100 MG  TABS (TRIMETHOPRIM ) as needed VITAMIN D3 2000 UNIT CAPS (CHOLECALCIFEROL) Take one tablet by mouth once daily. OMEPRAZOLE  20 MG CPDR (OMEPRAZOLE ) Take one tablet by mouth once daily. * CALCIUM  daily      Allergies Added: NKDA  Visit Type:  Follow-up Primary Provider:  Mavis  CC:  palpitations.  History of Present Illness: Alexandra Calderon is a pleasant 70 yo WF who presents for further evaluation of palpitations.  She reports that since her teenage years, she has had palpitations.  She describes these episode as brief (< several seconds) of irregular heart beat.  These had been stable for years, occuring once every few months.  These episodes gradually worsened.  Over the past few months, these episodes were occuring daily.  She denies dizziness, lightheades, CP, SOB, nausea, presyncope, or syncope. She has been evaluated by Dr Mavis who has recommended avoidance of sweets, caffeine, and stress.  She notes that with this lifestyle modificaiton, her symptoms have much improved.  She attributes her palpitations to decaffeinated coffee and has found that they have resolved since stopping coffee.  She has previously had PVCs captured which correspond to the symptoms.  Current Medications (verified): 1)  Adult Aspirin Ec Low Strength 81 Mg  Tbec (Aspirin) .... Once Daily 2)  Lisinopril-Hydrochlorothiazide  20-25 Mg  Tabs (Lisinopril-Hydrochlorothiazide ) .... Once Daily 3)  Atenolol  50 Mg  Tabs (Atenolol ) .... Once Daily 4)  Zocor  20 Mg  Tabs (Simvastatin ) .... One By Mouth Daily 5)  Trimethoprim  100 Mg  Tabs (Trimethoprim ) .... As Needed 6)  Vitamin D3 2000 Unit Caps (Cholecalciferol) .... Take One Tablet By Mouth Once Daily. 7)  Omeprazole  20 Mg Cpdr (Omeprazole ) .... Take One Tablet By Mouth Once Daily. 8)  Calcium  .... Daily  Allergies (verified): No Known Drug Allergies  Past History:  Past Medical  History: Hypertension Osteopenia PVCs Hyperlipidemia  diet controlled Osteoarthritis GERD Diverticulosis Kidney Stones Pneumonia Urinary Tract Infection Allergic rhinitis  Past Surgical History: kidney stones 1963 D and C x 3 shoulder arthroscopy right 2009  Family History: Reviewed history from 12/25/2008 and no changes required. father had Hodgkins mother had colon cancer Family History of Diabetes: GM, Aunt Family History of Colon Polyps: Daughter  Social History: Married and lives in [place] with spouse. 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 Alcohol Use - no Illicit Drug Use - no Patient does not get regular exercise.   Review of Systems       All systems are reviewed and negative except as listed in the HPI.   Vital Signs:  Patient profile:   70 year old female Height:      66 inches Weight:      177 pounds BMI:     28.67 Pulse rate:   62 / minute BP sitting:   148 / 84  (left arm)  Vitals Entered By: Denver Mayotte CMA (September 26, 2009 10:23 AM)  Physical Exam  General:  Well developed, well nourished, in no acute distress. Head:  normocephalic and atraumatic Eyes:  PERRLA/EOM intact; conjunctiva and lids normal. Nose:  no deformity, discharge, inflammation, or lesions Mouth:  Teeth, gums and palate normal. Oral mucosa normal. Neck:  Neck supple, no JVD. No masses, thyromegaly or abnormal cervical nodes. Lungs:  Clear bilaterally to auscultation and percussion. Heart:  Non-displaced PMI, chest non-tender; regular rate and rhythm, S1, S2 without murmurs, rubs or gallops. Carotid upstroke normal, no bruit. Normal abdominal aortic size,  no bruits. Femorals normal pulses, no bruits. Pedals normal pulses. No edema, no varicosities. Abdomen:  Bowel sounds positive; abdomen soft and non-tender without masses, organomegaly, or hernias noted. No hepatosplenomegaly. Msk:  Back normal, normal gait. Muscle strength and tone  normal. Pulses:  pulses normal in all 4 extremities Extremities:  No clubbing or cyanosis. Neurologic:  Alert and oriented x 3. Skin:  Intact without lesions or rashes. Cervical Nodes:  no significant adenopathy Psych:  Normal affect.   EKG  Procedure date:  09/26/2009  Findings:      sinus rhythm 62 bpm, PR 178, QRS 86, QT 424,  LAD with LVH  Impression & Recommendations:  Problem # 1:  PALPITATIONS (ICD-785.1) The patient presents today for EP consultation regarding palpitations.  She reports that her palpitations have been previously documented to be  due to PVCs.  Her EKG is benign today.  She has no symptoms of ischemia or CHF.  Her palpitations have significantly improved with lifestyle changes. At this point, we will obtain an echocardiogram to rule out structural abnormalities.  If her echo is normal, then no further cardiac testing is necessary at this time.  The importance of lifestyle changes were stressed today.  If her symptoms increase in frequency or change in character, then we will place an event monitor.  Problem # 2:  HYPERTENSION (ICD-401.9) above goal.  She will keep a journal of her blood pressure readings and contact our office if her BP remains elevated  Her updated medication list for this problem includes:    Adult Aspirin Ec Low Strength 81 Mg Tbec (Aspirin) ..... Once daily    Lisinopril-hydrochlorothiazide  20-25 Mg Tabs (Lisinopril-hydrochlorothiazide ) ..... Once daily    Atenolol  50 Mg Tabs (Atenolol ) ..... Once daily  Orders: EKG w/ Interpretation (93000)  Problem # 3:  HYPERLIPIDEMIA NEC/NOS (ICD-272.4) stable  Her updated medication list for this problem includes:    Zocor  20 Mg Tabs (Simvastatin ) ..... One by mouth daily  Patient Instructions: 1)  Your physician recommends that you schedule a follow-up appointment in: 3 months with DrAllred 2)  Your physician has requested that you have an echocardiogram.  Echocardiography is a painless test  that uses sound waves to create images of your heart. It provides your doctor with information about the size and shape of your heart and how well your heart's chambers and valves are working.  This procedure takes approximately one hour. There are no restrictions for this procedure.

## 2010-11-12 NOTE — Procedures (Signed)
 Summary: colonoscopy/path  colonoscopy   Imported By: Doyal Daring 05/11/2008 14:18:09  _____________________________________________________________________  External Attachment:    Type:   Image     Comment:   colonoscopy  Appended Document: colonoscopy 6 mm adenoma on path

## 2010-11-12 NOTE — Assessment & Plan Note (Signed)
 Summary: FLUSHOT/MHF  Nurse Visit    Prior Medications: ADULT ASPIRIN EC LOW STRENGTH 81 MG  TBEC (ASPIRIN) once daily LISINOPRIL-HYDROCHLOROTHIAZIDE  20-25 MG  TABS (LISINOPRIL-HYDROCHLOROTHIAZIDE ) once daily ATENOLOL  50 MG  TABS (ATENOLOL ) once daily ZOCOR  20 MG  TABS (SIMVASTATIN ) one by mouth daily CALCIUM -VITAMIN D  250-125 MG-UNIT  TABS (CALCIUM  CARBONATE-VITAMIN D ) 2 day PRILOSEC 20 MG  CPDR (OMEPRAZOLE ) once daily BONIVA 150 MG  TABS (IBANDRONATE SODIUM) one a month    Influenza Vaccine    Vaccine Type: Fluvax Non-MCR    Given by: Clotilda GORMAN Necessary, CMA  Flu Vaccine Consent Questions    Do you have a history of severe allergic reactions to this vaccine? no    Any prior history of allergic reactions to egg and/or gelatin? no    Do you have a sensitivity to the preservative Thimersol? no    Do you have a past history of Guillan-Barre Syndrome? no    Do you currently have an acute febrile illness? no    Have you ever had a severe reaction to latex? no    Vaccine information given and explained to patient? yes    Are you currently pregnant? no   Orders Added: 1)  Influenza Vaccine NON MCR [00028]   Impression & Recommendations:  lot [id number], EXP 30 jun 09, [employer]ofi pasteur left deltoid IM, 0.5 cc. ..................................................................SABRAClotilda GORMAN Necessary, CMA  July 29, 2007 3:08 PM   Complete Medication List: 1)  Adult Aspirin Ec Low Strength 81 Mg Tbec (Aspirin) .... Once daily 2)  Lisinopril-hydrochlorothiazide  20-25 Mg Tabs (Lisinopril-hydrochlorothiazide ) .... Once daily 3)  Atenolol  50 Mg Tabs (Atenolol ) .... Once daily 4)  Zocor  20 Mg Tabs (Simvastatin ) .... One by mouth daily 5)  Calcium -vitamin D  250-125 Mg-unit Tabs (Calcium  carbonate-vitamin d ) .... 2 day 6)  Prilosec 20 Mg Cpdr (Omeprazole ) .... Once daily 7)  Boniva 150 Mg Tabs (Ibandronate sodium) .... One a month    ]

## 2010-11-12 NOTE — Assessment & Plan Note (Signed)
Summary: 3 month rov  Medications Added SLOW-MAG 71.5-119 MG TBEC (MAGNESIUM CL-CALCIUM CARBONATE) 1 tablet at bedtime        Visit Type:  Follow-up Primary Provider:  Lovell Sheehan   History of Present Illness: The patient presents today for routine electrophysiology followup. She reports doing very well since last being seen in our clinic.  She continues to have occasional PVCs.   The patient denies symptoms of  chest pain, shortness of breath, orthopnea, PND, lower extremity edema, dizziness, presyncope, syncope, or neurologic sequela. The patient is tolerating medications without difficulties and is otherwise without complaint today.   Current Medications (verified): 1)  Adult Aspirin Ec Low Strength 81 Mg  Tbec (Aspirin) .... Once Daily 2)  Lisinopril-Hydrochlorothiazide 20-25 Mg  Tabs (Lisinopril-Hydrochlorothiazide) .... Once Daily 3)  Atenolol 50 Mg  Tabs (Atenolol) .... Once Daily 4)  Zocor 20 Mg  Tabs (Simvastatin) .... One By Mouth Daily 5)  Trimethoprim 100 Mg  Tabs (Trimethoprim) .... As Needed 6)  Vitamin D3 2000 Unit Caps (Cholecalciferol) .... Take One Tablet By Mouth Once Daily. 7)  Omeprazole 20 Mg Cpdr (Omeprazole) .... Take One Tablet By Mouth Once Daily. 8)  Calcium .... Daily 9)  Slow-Mag 71.5-119 Mg Tbec (Magnesium Cl-Calcium Carbonate) .Marland Kitchen.. 1 Tablet At Bedtime  Allergies: 1)  ! Levaquin 2)  ! Septra (Sulfamethoxazole-Trimethoprim) 3)  ! Sulfa  Past History:  Past Medical History: Reviewed history from 09/26/2009 and no changes required. Hypertension Osteopenia PVCs Hyperlipidemia  diet controlled Osteoarthritis GERD Diverticulosis Kidney Stones Pneumonia Urinary Tract Infection Allergic rhinitis  Past Surgical History: Reviewed history from 09/26/2009 and no changes required. kidney stones 1963 D and C x 3 shoulder arthroscopy right 2009  Social History: Reviewed history from 09/26/2009 and no changes required. Married and lives in [place]  with spouse. 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 Alcohol Use - no Illicit Drug Use - no Patient does not get regular exercise.   Vital Signs:  Patient profile:   70 year old female Height:      66 inches Weight:      178 pounds Pulse rate:   66 / minute BP sitting:   106 / 76  (left arm)  Vitals Entered By: Laurance Flatten CMA (January 02, 2010 9:23 AM)  Physical Exam  General:  Well developed, well nourished, in no acute distress. Head:  normocephalic and atraumatic Nose:  no deformity, discharge, inflammation, or lesions Mouth:  Teeth, gums and palate normal. Oral mucosa normal. Neck:  Neck supple, no JVD. No masses, thyromegaly or abnormal cervical nodes. Lungs:  Clear bilaterally to auscultation and percussion. Heart:  Non-displaced PMI, chest non-tender; regular rate and rhythm, S1, S2 without murmurs, rubs or gallops. Carotid upstroke normal, no bruit. Normal abdominal aortic size, no bruits. Femorals normal pulses, no bruits. Pedals normal pulses. No edema, no varicosities. Abdomen:  Bowel sounds positive; abdomen soft and non-tender without masses, organomegaly, or hernias noted. No hepatosplenomegaly. Msk:  Back normal, normal gait. Muscle strength and tone normal. Neurologic:  Alert and oriented x 3.   EKG  Procedure date:  01/02/2010  Findings:      sinus rhythm 66 bpm, otherwise normal ekg  Impression & Recommendations:  Problem # 1:  PALPITATIONS (ICD-785.1) The patients palpitations are due to PVCs.  Continue atenolol. Her PVCs are too infrequent for ablation at this time, however we will consider ablation if they increase  Problem # 2:  HYPERLIPIDEMIA NEC/NOS (ICD-272.4) stable  Other Orders: EKG  w/ Interpretation (93000)  Patient Instructions: 1)  Your physician recommends that you schedule a follow-up appointment as needed

## 2010-11-12 NOTE — Progress Notes (Signed)
 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 Fax number provided. Fax received, signed and faxed back to dental office. Additional Follow-up by: Inocente Gallon LPN,  August 04, 2007 9:54 AM

## 2010-11-12 NOTE — Letter (Signed)
 Summary: Gynecology-Dr. Lomax  Gynecology-Dr. Lomax   Imported By: Mliss Shutter 04/02/2009 13:55:30  _____________________________________________________________________  External Attachment:    Type:   Image     Comment:   External Document

## 2010-11-12 NOTE — Progress Notes (Signed)
 Summary: charges?  Phone Note Call from Patient Call back at Home Phone [phone]   Caller: patient  Call For: jenkins Summary of Call: Came in on August 11 and had a tetenus shot.  She was billed $71.00 for the shot and $29.00 to administer the shot.  Could you please call this patient about these charges.  cell [phone] Initial call taken by: Venus Roan,  November 01, 2007 1:08 PM  Follow-up for Phone Call        called profee and spoke w/Crystal concerning this DOS.  She advised me that the patient's insurance company denied the claim for injection because her plan does not cover these services.  I LMOM for patient to call me back so that I can explain this to her. Follow-up by: Marley Cinda Pounds,  November 03, 2007 9:48 AM  Additional Follow-up for Phone Call Additional follow up Details #1::        Pt called back and states she wants the charge for admin fee removed.. I advised her we could not remove the charge, whenever she gets an injection we also charge for the administration, however her insurance does not cover these services therefore she is responsible for the balance.. Additional Follow-up by: Marley Cinda Pounds,  November 03, 2007 11:38 AM

## 2010-11-12 NOTE — Assessment & Plan Note (Signed)
 Summary: Alexandra Calderon   Vital Signs:  Patient Profile:   70 Years Old Female Height:     66 inches Weight:      170 pounds Temp:     97.8 degrees F oral Pulse rate:   76 / minute Resp:     12 per minute BP sitting:   130 / 80  (left arm)  Vitals Entered By: Kendell CHRISTELLA Pouch, LPN (July 01, 2007 1:34 PM)                 Chief Complaint:  Alexandra Calderon.  Hypertension History:      She complains of headache, but denies chest pain, palpitations, dyspnea with exertion, orthopnea, PND, peripheral edema, visual symptoms, neurologic problems, syncope, and side effects from treatment.  She notes no problems with any antihypertensive medication side effects.        Positive major cardiovascular risk factors include female age 36 years old or older, hyperlipidemia, and hypertension.  Negative major cardiovascular risk factors include non-tobacco-user status.       Past Medical History:    Reviewed history from 04/07/2007 and no changes required:       Hypertension       Osteopenia       Palpitation       Hyperlipidemia  diet controlled   Family History:    Reviewed history from 05/24/2007 and no changes required:       father  had hodkins       mother had colon cancer  Social History:    Reviewed history from 05/24/2007 and no changes required:       Married       Never Smoked     Physical Exam  General:     Well-developed,well-nourished,in no acute distress; alert,appropriate and cooperative throughout examination Head:     Normocephalic and atraumatic without obvious abnormalities. No apparent alopecia or balding. Ears:     no external deformities.   Nose:     no nasal discharge.   Mouth:     pharynx pink and moist.   Lungs:     normal respiratory effort and no wheezes.   Heart:     normal rate and regular rhythm.   Abdomen:     non-tender.   Msk:     tenderin mid back with spasm extending to scapula Pulses:     R and L carotid,radial,femoral,dorsalis pedis and  posterior tibial pulses are full and equal bilaterally Neurologic:     alert & oriented X3.   Skin:     turgor normal, color normal, and no purpura.   Cervical Nodes:     No lymphadenopathy noted Axillary Nodes:     No palpable lymphadenopathy Psych:     Oriented X3 and normally interactive.      Impression & Recommendations:  Problem # 1:  HYPERLIPIDEMIA (ICD-272.4) changed to  Her updated medication list for this problem includes:    Zocor  20 Mg Tabs (Simvastatin ) ..... One by mouth daily see sept labs as well Labs Reviewed: Chol: 166 (01/07/2007)   HDL: 38.4 (01/07/2007)   LDL: 109 (01/07/2007)   TG: 95 (01/07/2007) SGOT: 20 (01/07/2007)   SGPT: 14 (01/07/2007)   Problem # 2:  HYPERTENSION (ICD-401.9)  The following medications were removed from the medication list:    Nadolol 40 Mg Tabs (Nadolol)  Her updated medication list for this problem includes:    Lisinopril-hydrochlorothiazide  20-25 Mg Tabs (Lisinopril-hydrochlorothiazide ) ..... Once daily    Atenolol  50 Mg  Tabs (Atenolol ) ..... Once daily  BP today: 130/80 Prior BP: 132/74 (05/24/2007)  Labs Reviewed: Chol: 166 (01/07/2007)   HDL: 38.4 (01/07/2007)   LDL: 109 (01/07/2007)   TG: 95 (01/07/2007)   Problem # 3:  MUSCLE SPASM, BACK (ICD-724.8)  The following medications were removed from the medication list:    Mobic 15 Mg Tabs (Meloxicam) ..... One by mouth daily  Her updated medication list for this problem includes:    Adult Aspirin Ec Low Strength 81 Mg Tbec (Aspirin) ..... Once daily Discussed use of moist heat or ice, modified activities, medications, and stretching/strengthening exercises. Back care instructions given. To be seen in 2 weeks if no improvement; sooner if worsening of symptoms.   Complete Medication List: 1)  Adult Aspirin Ec Low Strength 81 Mg Tbec (Aspirin) .... Once daily 2)  Lisinopril-hydrochlorothiazide  20-25 Mg Tabs (Lisinopril-hydrochlorothiazide ) .... Once daily 3)  Atenolol   50 Mg Tabs (Atenolol ) .... Once daily 4)  Zocor  20 Mg Tabs (Simvastatin ) .... One by mouth daily 5)  Calcium -vitamin D  250-125 Mg-unit Tabs (Calcium  carbonate-vitamin d ) .... 2 day 6)  Prilosec 20 Mg Cpdr (Omeprazole ) .... Once daily 7)  Boniva 150 Mg Tabs (Ibandronate sodium) .... One a month  Hypertension Assessment/Plan:      The patient's hypertensive risk group is category B: At least one risk factor (excluding diabetes) with no target organ damage.  Her calculated 10 year risk of coronary heart disease is 13 %.  Today's blood pressure is 130/80.  Her blood pressure goal is < 140/90.   Patient Instructions: 1)  Please schedule a follow-up appointment in 4 months. 2)  Hepatic Panel prior to visit, ICD-9: 995.20 3)  Lipid Panel prior to visit, ICD-9:272.4 4)  1000iu vit d    Prescriptions: BONIVA 150 MG  TABS (IBANDRONATE SODIUM) one a month  #1 x 0   Entered and Authorized by:   Norleen FORBES Budge MD   Signed by:   Norleen FORBES Budge MD on 07/02/2007   Method used:   Print then Give to Patient   RxID:   [id number] ZOCOR  20 MG  TABS (SIMVASTATIN ) one by mouth daily  #30 x 11   Entered and Authorized by:   Norleen FORBES Budge MD   Signed by:   Norleen FORBES Budge MD on 07/01/2007   Method used:   Print then Give to Patient   RxID:   [id number]  ]

## 2010-11-12 NOTE — Procedures (Signed)
Summary: Colonoscopy  Patient: Alexandra Calderon Note: All result statuses are Final unless otherwise noted.  Tests: (1) Colonoscopy (COL)   COL Colonoscopy           DONE     [facility]     [address]     [place], [place]  [address]           COLONOSCOPY PROCEDURE REPORT           PATIENT:  Alexandra, Calderon  MR#:  [id number]     BIRTHDATE:  06/15/1941, 68 yrs. 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  Digital rectal exam was performed and     revealed no abnormalities.   The LB [id number] endoscope     was introduced through the anus and advanced to the cecum, which     was identified by both the appendix and ileocecal valve, without     limitations.  The quality of the prep was excellent, using     MoviPrep.  The instrument was then slowly withdrawn as the colon     was fully examined.     Insertion: 3:35 minutes Withdrawal: 9:20 minutes     <<PROCEDUREIMAGES>>           FINDINGS:  A normal appearing cecum, ileocecal valve, and     appendiceal orifice were identified. The ascending, hepatic     flexure, transverse, splenic flexure, descending, sigmoid colon,     and rectum appeared unremarkable.   Retroflexed views in the     rectum revealed internal hemorrhoids.    The scope was then     withdrawn from the patient and the procedure completed.           COMPLICATIONS:   None           ENDOSCOPIC IMPRESSION:     1) Normal colon, excellent prep     2) Internal hemorrhoids in the rectum           3) Prior adenomas 2003 and 2006     4) Family history of colon cancer           REPEAT EXAM:  In 5 year(s) for routine colonoscopy for polyp     surveillance.  For family history of colon cancer also.           Iva Boop, MD, [facility]           CC:  Stacie Glaze, MD     The Patient  n.     eSIGNED:   Iva Boop at 01/01/2010 10:26 AM           Alexandra Calderon, [id number]  Note: An exclamation mark (!) indicates a result that was not dispersed into the flowsheet. Document Creation Date: 01/01/2010 10:26 AM _______________________________________________________________________  (1) Order result status: Final Collection or observation date-time: 01/01/2010 10:17 Requested date-time:  Receipt date-time:  Reported date-time:  Referring Physician:   Ordering Physician: Stan Head [phone]) Specimen Source:  Source: Launa Grill Order Number: [id number] Lab site:   Appended Document: Colonoscopy    Clinical Lists Changes  Observations: Added new observation of COLONNXTDUE: 12/2014 (01/01/2010 14:35)

## 2010-11-12 NOTE — Progress Notes (Signed)
Summary: REFILL REQUEST  Phone Note Refill Request Message from:  Patient on September 17, 2010 12:11 PM  Refills Requested: Medication #1:  ATENOLOL 50 MG  TABS once daily   Notes: [facility].    Initial call taken by: Debbra Riding,  September 17, 2010 12:11 PM    Prescriptions: ATENOLOL 50 MG  TABS (ATENOLOL) once daily  #90 Tablet x 3   Entered by:   Willy Eddy, LPN   Authorized by:   Stacie Glaze MD   Signed by:   Willy Eddy, LPN on 16/07/9603   Method used:   Electronically to        [facility]* (retail)             ,          Ph: [phone]       Fax: [phone]   RxID:   [id number]

## 2010-11-12 NOTE — Letter (Signed)
Summary: Previsit letter  [facility]  [address] [place], [place] [address]   Phone: [phone]  Fax: [phone]       11/22/2009 MRN: [id number]  Alexandra Calderon [address] [place], [place]  [address]  Dear Ms. Etcheverry,  Welcome to the Gastroenterology Division at [facility].    You are scheduled to see a nurse for your pre-procedure visit on 12/06/2009 at 4:00PM on the 3rd floor at [facility], [address]  We ask that you try to arrive at our office 15 minutes prior to your appointment time to allow for check-in.  Your nurse visit will consist of discussing your medical and surgical history, your immediate family medical history, and your medications.    Please bring a complete list of all your medications or, if you prefer, bring the medication bottles and we will list them.  We will need to be aware of both prescribed and over the counter drugs.  We will need to know exact dosage information as well.  If you are on blood thinners (Coumadin, Plavix, Aggrenox, Ticlid, etc.) please call our office today/prior to your appointment, as we need to consult with your physician about holding your medication.   Please be prepared to read and sign documents such as consent forms, a financial agreement, and acknowledgement forms.  If necessary, and with your consent, a friend or relative is welcome to sit-in on the nurse visit with you.  Please bring your insurance card so that we may make a copy of it.  If your insurance requires a referral to see a specialist, please bring your referral form from your primary care physician.  No co-pay is required for this nurse visit.     If you cannot keep your appointment, please call [phone] to cancel or reschedule prior to your appointment date.  This allows [place] the opportunity to schedule an appointment for another patient in need of care.    Thank you for choosing [facility] for your medical needs.   We appreciate the opportunity to care for you.  Please visit [place] at our website  to learn more about our practice.                     Sincerely.                                                                                                                   The Gastroenterology Division

## 2010-11-12 NOTE — Progress Notes (Signed)
 Summary: TRIAGE   Phone Note Call from Patient Call back at Home Phone [phone]   Call For: Any Dr that will see her Reason for Call: Acute Illness Summary of Call: Burning in her chest goes down into her side and back. Wants to be seen today or tomorrow. Former pt of Dr Cloretta Initial call taken by: Dyane Lerner [facility],  November 22, 2008 8:27 AM  Follow-up for Phone Call        Pt. takes Prilosec two times a day. C/O intermittent episodes of burning in the middle of her chest, goes to her right side and then into her back, X1week. Denies n/v, dysphagia, bloating,costipation,diarrhea,black stools,blood or fever. I offered her an appointment w/Paula for Monday, she declined, states she is seeing her PCP on Monday. She has an appointment w/Dr.Ariannah Arenson for 12-20-08. 1) Continue Prilosec two times a day 2) Soft,bland diet. No spicy,greasy,fried foods.  3) Maalox, Mylanta or Gaviscon three times a day and as needed. 4) Appt. w/Dr.Aivah Putman moved to 11-28-08 at 3pm. 5) If symptoms become worse call back immediately or go to ER.   DR.KAPLAN [facility] OF THE DAY) PLEASE REVIEW AND ADVISE Follow-up by: Barnie Millman LPN,  November 22, 2008 10:45 AM  Additional Follow-up for Phone Call Additional follow up Details #1::        can be seen sooner by a PA or MD if she wishes Additional Follow-up by: Lamar JONETTA Aho MD,  November 22, 2008 3:34 PM      Appended Document: TRIAGE O.K.

## 2010-11-12 NOTE — Assessment & Plan Note (Signed)
 Summary: flu shot/mhf   Impression & Recommendations: Flu Vaccine Consent Questions     Do you have a history of severe allergic reactions to this vaccine? no    Any prior history of allergic reactions to egg and/or gelatin? no    Do you have a sensitivity to the preservative Thimersol? no    Do you have a past history of Guillan-Barre Syndrome? no    Do you currently have an acute febrile illness? no    Have you ever had a severe reaction to latex? no    Vaccine information given and explained to patient? yes    Are you currently pregnant? no    Lot Number:[id number]   Site Given  Left Deltoid IM   Complete Medication List: 1)  Adult Aspirin Ec Low Strength 81 Mg Tbec (Aspirin) .... Once daily 2)  Lisinopril-hydrochlorothiazide  20-25 Mg Tabs (Lisinopril-hydrochlorothiazide ) .... Once daily 3)  Atenolol  50 Mg Tabs (Atenolol ) .... Once daily 4)  Zocor  20 Mg Tabs (Simvastatin ) .... One by mouth daily 5)  Liquid Calcium /vitamin D  600-200 Mg-unit Caps (Calcium  carbonate-vitamin d ) .... 600 per day 6)  Prilosec 20 Mg Cpdr (Omeprazole ) .... Once daily 7)  Boniva 150 Mg Tabs (Ibandronate sodium) .... One a month 8)  Trimethoprim  100 Mg Tabs (Trimethoprim ) .... Once daily 9)  Vitamin D3 400 Unit Tabs (Cholecalciferol) .... Once daily   Nurse Visit    Prior Medications: ADULT ASPIRIN EC LOW STRENGTH 81 MG  TBEC (ASPIRIN) once daily LISINOPRIL-HYDROCHLOROTHIAZIDE  20-25 MG  TABS (LISINOPRIL-HYDROCHLOROTHIAZIDE ) once daily ATENOLOL  50 MG  TABS (ATENOLOL ) once daily ZOCOR  20 MG  TABS (SIMVASTATIN ) one by mouth daily LIQUID CALCIUM /VITAMIN D  600-200 MG-UNIT  CAPS (CALCIUM  CARBONATE-VITAMIN D ) 600 per day PRILOSEC 20 MG  CPDR (OMEPRAZOLE ) once daily BONIVA 150 MG  TABS (IBANDRONATE SODIUM) one a month TRIMETHOPRIM  100 MG  TABS (TRIMETHOPRIM ) once daily VITAMIN D3 400 UNIT  TABS (CHOLECALCIFEROL) once daily Current Allergies: No known allergies     Orders Added: 1)  Admin 1st  Vaccine [90471] 2)  Flu Vaccine 55yrs + [email or web address]    ]

## 2010-11-12 NOTE — Progress Notes (Signed)
Summary: Align??  Phone Note Call from Patient   Caller: Patient Call For: Stacie Glaze MD Reason for Call: Acute Illness Summary of Call: Pt wants to know if Dr. Lovell Sheehan has a problem with her taking Align from Dr. Nicholas Lose?? [phone] Initial call taken by: Lynann Beaver CMA,  January 31, 2010 12:33 PM  Follow-up for Phone Call        per dr Lovell Sheehan -ok  Follow-up by: Willy Eddy, LPN,  January 31, 2010 12:51 PM  Additional Follow-up for Phone Call Additional follow up Details #1::        Pt. notified. Additional Follow-up by: Lynann Beaver CMA,  January 31, 2010 1:30 PM

## 2010-11-12 NOTE — Progress Notes (Signed)
 Summary: tick bite  Phone Note Call from Patient   Caller: Patient Call For: Alexandra Calderon Budge MD Summary of Call: Pt called stating she had a tick bite 15 days ago, and  now has a nickel sized red area around the bite site.  No fever, rash, headache or neck pain.  Does not feel ill. [phone] Initial call taken by: Marissa Shropshire CMA,  January 08, 2009 9:38 AM  Follow-up for Phone Call        ov given for tomorrow to check  Follow-up by: Kendell CHRISTELLA Pouch, LPN,  January 08, 2009 10:39 AM

## 2010-11-12 NOTE — Letter (Signed)
Summary: Gynecology-Dr. Beather Arbour  Gynecology-Dr. Beather Arbour   Imported By: Maryln Gottron 05/01/2010 12:43:15  _____________________________________________________________________  External Attachment:    Type:   Image     Comment:   External Document

## 2010-11-12 NOTE — Letter (Signed)
Summary: [facility] Instructions  [facility]  [address] [place], [place] [address]   Phone: [phone]  Fax: [phone]       Alexandra Calderon    [birth year]    MRN: [id number]        Procedure Day /Date:  Tuesday  01/01/10     Arrival Time:  9:30am     Procedure Time:  10:30am     Location of Procedure:                    _ X_  [facility] (4th Floor)                        PREPARATION FOR COLONOSCOPY WITH MOVIPREP   Starting 5 days prior to your procedure   Thursday 03/17 do not eat nuts, seeds, popcorn, corn, beans, peas,  salads, or any raw vegetables.  Do not take any fiber supplements (e.g. Metamucil, Citrucel, and Benefiber).  THE DAY BEFORE YOUR PROCEDURE         DATE:   03/21   DAY:  Monday  1.  Drink clear liquids the entire day-NO SOLID FOOD  2.  Do not drink anything colored red or purple.  Avoid juices with pulp.  No orange juice.  3.  Drink at least 64 oz. (8 glasses) of fluid/clear liquids during the day to prevent dehydration and help the prep work efficiently.  CLEAR LIQUIDS INCLUDE: Water Jello Ice Popsicles Tea (sugar ok, no milk/cream) Powdered fruit flavored drinks Coffee (sugar ok, no milk/cream) Gatorade Juice: apple, white grape, white cranberry  Lemonade Clear bullion, consomm, broth Carbonated beverages (any kind) Strained chicken noodle soup Hard Candy                             4.  In the morning, mix first dose of MoviPrep solution:    Empty 1 Pouch A and 1 Pouch B into the disposable container    Add lukewarm drinking water to the top line of the container. Mix to dissolve    Refrigerate (mixed solution should be used within 24 hrs)  5.  Begin drinking the prep at 5:00 p.m. The MoviPrep container is divided by 4 marks.   Every 15 minutes drink the solution down to the next mark (approximately 8 oz) until the full liter is complete.   6.  Follow completed prep with 16 oz of clear liquid of your  choice (Nothing red or purple).  Continue to drink clear liquids until bedtime.  7.  Before going to bed, mix second dose of MoviPrep solution:    Empty 1 Pouch A and 1 Pouch B into the disposable container    Add lukewarm drinking water to the top line of the container. Mix to dissolve    Refrigerate  THE DAY OF YOUR PROCEDURE      DATE:   03/22  DAY: Tuesday  Beginning at  5:30 a.m. (5 hours before procedure):         1. Every 15 minutes, drink the solution down to the next mark (approx 8 oz) until the full liter is complete.  2. Follow completed prep with 16 oz. of clear liquid of your choice.    3. You may drink clear liquids until 8:30am  (2 HOURS BEFORE PROCEDURE).   MEDICATION INSTRUCTIONS  Unless otherwise instructed, you should take regular prescription medications with a  small sip of water   as early as possible the morning of your procedure.    Additional medication instructions: Hold Lisinopril/HCTZ the morning of procedure.         OTHER INSTRUCTIONS  You will need a responsible adult at least 68 years of age to accompany you and drive you home.   This person must remain in the waiting room during your procedure.  Wear loose fitting clothing that is easily removed.  Leave jewelry and other valuables at home.  However, you may wish to bring a book to read or  an iPod/MP3 player to listen to music as you wait for your procedure to start.  Remove all body piercing jewelry and leave at home.  Total time from sign-in until discharge is approximately 2-3 hours.  You should go home directly after your procedure and rest.  You can resume normal activities the  day after your procedure.  The day of your procedure you should not:   Drive   Make legal decisions   Operate machinery   Drink alcohol   Return to work  You will receive specific instructions about eating, activities and medications before you leave.    The above instructions have been  reviewed and explained to me by   Wyona Almas RN  December 06, 2009 4:31 PM     I fully understand and can verbalize these instructions _____________________________ Date _________

## 2010-11-12 NOTE — Progress Notes (Signed)
Summary: tick bite  Phone Note Call from Patient   Caller: Patient Call For: Stacie Glaze MD Summary of Call: Pt. had a tick bite 03/01/2010, and it has a small red bump on area of bite.  Any reason to be concerned? [phone] Initial call taken by: Lynann Beaver CMA,  Mar 04, 2010 9:21 AM  Follow-up for Phone Call        gave her the tick bite protocol- pt voiced understanding Follow-up by: Willy Eddy, LPN,  Mar 04, 2010 12:17 PM

## 2010-11-12 NOTE — Assessment & Plan Note (Signed)
Summary: 3 MONTH ROV/NJR   Vital Signs:  Patient profile:   70 year old female Height:      66 inches Weight:      178 pounds BMI:     28.83 Temp:     98.2 degrees F oral Pulse rate:   72 / minute Resp:     14 per minute BP sitting:   136 / 80  (left arm)  Vitals Entered By: Willy Eddy, LPN (March 29, 2010 9:17 AM) CC: roa, Dysuria   Primary Care Provider:  Lovell Sheehan  CC:  roa and Dysuria.  History of Present Illness:  Dysuria      This is a 70 year old woman who presents with Dysuria.  the pt has vaginitis and frequent UTI's the primary event that stimulate  these infections is intercourse.  The patient reports burning with urination, urinary frequency, urgency, and vaginal discharge, but denies hematuria, vaginal itching, vaginal sores, and penile discharge.  The patient denies the following associated symptoms: nausea, vomiting, fever, shaking chills, flank pain, abdominal pain, back pain, pelvic pain, and arthralgias.  Risk factors for urinary tract infection include prior antibiotics and history of GU anomaly.  History is significant for > 3 UTIs in one year.    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) 3)  Tick Bite  (ICD-E906.4) 4)  Adenocarcinoma, Colon, Family Hx  (ICD-V16.0) 5)  Colonic Polyps, Adenomatous, Hx of  (ICD-V12.72) 6)  Gerd  (ICD-530.81) 7)  Abdominal Pain, Epigastric  (ICD-789.06) 8)  Gerd  (ICD-530.81) 9)  Osteoarthritis  (ICD-715.90) 10)  Rotator Cuff Tear  (ICD-727.61) 11)  Preventive Health Care  (ICD-V70.0) 12)  Bursitis, Right Shoulder  (ICD-726.10) 13)  Hyperlipidemia  (ICD-272.4) 14)  Hyperlipidemia Nec/nos  (ICD-272.4) 15)  Sprain/strain, Ankle, Calcaneofibular  (ICD-845.02) 16)  Muscle Spasm, Back  (ICD-724.8) 17)  Osteopenia  (ICD-733.90) 18)  Hypertension   (ICD-401.9)  Current Problems (verified): 1)  Palpitations  (ICD-785.1) 2)  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) 3)  Tick Bite  (ICD-E906.4) 4)  Adenocarcinoma, Colon, Family Hx  (ICD-V16.0) 5)  Colonic Polyps, Adenomatous, Hx of  (ICD-V12.72) 6)  Gerd  (ICD-530.81) 7)  Abdominal Pain, Epigastric  (ICD-789.06) 8)  Gerd  (ICD-530.81) 9)  Osteoarthritis  (ICD-715.90) 10)  Rotator Cuff Tear  (ICD-727.61) 11)  Preventive Health Care  (ICD-V70.0) 12)  Bursitis, Right Shoulder  (ICD-726.10) 13)  Hyperlipidemia  (ICD-272.4) 14)  Hyperlipidemia Nec/nos  (ICD-272.4) 15)  Sprain/strain, Ankle, Calcaneofibular  (ICD-845.02) 16)  Muscle Spasm, Back  (ICD-724.8) 17)  Osteopenia  (ICD-733.90) 18)  Hypertension  (ICD-401.9)  Medications Prior to Update: 1)  Adult Aspirin Ec Low Strength 81 Mg  Tbec (Aspirin) .... Once Daily 2)  Lisinopril-Hydrochlorothiazide 20-25 Mg  Tabs (Lisinopril-Hydrochlorothiazide) .... Once Daily 3)  Atenolol 50 Mg  Tabs (Atenolol) .... Once Daily 4)  Zocor 20 Mg  Tabs (Simvastatin) .... One By Mouth Daily 5)  Trimethoprim 100 Mg  Tabs (Trimethoprim) .... As Needed 6)  Vitamin D3 2000 Unit Caps (Cholecalciferol) .... Take One Tablet By Mouth Once Daily. 7)  Omeprazole 20 Mg Cpdr (Omeprazole) .... Take One Tablet By Mouth Once Daily. 8)  Calcium .... Daily 9)  Slow-Mag 71.5-119 Mg Tbec (Magnesium Cl-Calcium Carbonate) .Marland Kitchen.. 1 Tablet At Bedtime  Current Medications (verified): 1)  Adult Aspirin Ec Low Strength  81 Mg  Tbec (Aspirin) .... Once Daily 2)  Lisinopril-Hydrochlorothiazide 20-25 Mg  Tabs (Lisinopril-Hydrochlorothiazide) .... Once Daily 3)  Atenolol 50 Mg  Tabs (Atenolol) .... Once Daily 4)  Zocor 20 Mg  Tabs (Simvastatin) .... One By Mouth Daily 5)  Trimethoprim 100 Mg  Tabs (Trimethoprim) .... As Needed 6)  Vitamin D3 2000 Unit Caps (Cholecalciferol) .... Take One Tablet By Mouth Once Daily. 7)  Omeprazole 20 Mg Cpdr (Omeprazole) .... Take One Tablet By Mouth Once  Daily. 8)  Calcium .... Daily 9)  Slow-Mag 71.5-119 Mg Tbec (Magnesium Cl-Calcium Carbonate) .Marland Kitchen.. 1 Tablet At Bedtime  Allergies (verified): 1)  ! Levaquin 2)  ! Septra (Sulfamethoxazole-Trimethoprim) 3)  ! Sulfa  Past History:  Family History: Last updated: 12/25/2008 father had Hodgkins mother had colon cancer Family History of Diabetes: GM, Aunt Family History of Colon Polyps: Daughter  Social History: Last updated: 09/26/2009 Married and lives in [place] with spouse. 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 Alcohol Use - no Illicit Drug Use - no Patient does not get regular exercise.   Risk Factors: Exercise: no (11/28/2008)  Risk Factors: Smoking Status: never (03/29/2010) Passive Smoke Exposure: no (03/29/2010)  Past medical, surgical, family and social histories (including risk factors) reviewed, and no changes noted (except as noted below).  Past Medical History: Reviewed history from 09/26/2009 and no changes required. Hypertension Osteopenia PVCs Hyperlipidemia  diet controlled Osteoarthritis GERD Diverticulosis Kidney Stones Pneumonia Urinary Tract Infection Allergic rhinitis  Past Surgical History: Reviewed history from 09/26/2009 and no changes required. kidney stones 1963 D and C x 3 shoulder arthroscopy right 2009  Family History: Reviewed history from 12/25/2008 and no changes required. father had Hodgkins mother had colon cancer Family History of Diabetes: GM, Aunt Family History of Colon Polyps: Daughter  Social History: Reviewed history from 09/26/2009 and no changes required. Married and lives in [place] with spouse. 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 Alcohol Use - no Illicit Drug Use - no Patient does not get regular exercise.   Review of Systems  The patient denies anorexia, fever, weight loss, weight gain, vision loss, decreased hearing, hoarseness, chest pain,  syncope, dyspnea on exertion, peripheral edema, prolonged cough, headaches, hemoptysis, abdominal pain, melena, hematochezia, severe indigestion/heartburn, hematuria, incontinence, genital sores, muscle weakness, suspicious skin lesions, transient blindness, difficulty walking, depression, unusual weight change, abnormal bleeding, enlarged lymph nodes, angioedema, and breast masses.    Physical Exam  General:  Well developed, well nourished, no acute distress. Head:  Normocephalic and atraumatic. Eyes:  pupils equal and pupils round.   Ears:  no external deformities.  R ear normal and L ear normal.   Nose:  External nasal examination shows no deformity or inflammation. Nasal mucosa are pink and moist without lesions or exudates. Mouth:  No deformity or lesions, dentition normal. Neck:  Supple; no masses or thyromegaly. Lungs:  Clear throughout to auscultation. Heart:  Regular rate and rhythm; no murmurs, rubs,  or bruits. Abdomen:  Soft, nontender and nondistended. No masses, hepatosplenomegaly or hernias noted. Normal bowel sounds. Msk:  normal ROM and no joint tenderness.   Neurologic:  Alert and  oriented x4;  grossly normal neurologically.   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 As needed  Encouraged to push clear liquids, get enough rest,  and take acetaminophen as needed. To be seen in 10 days if no improvement, sooner if worse.  Problem # 2:  HYPERTENSION (ICD-401.9) Assessment: Unchanged  Her updated medication list for this problem includes:    Lisinopril-hydrochlorothiazide 20-25 Mg Tabs (Lisinopril-hydrochlorothiazide) ..... Once daily    Atenolol 50 Mg Tabs (Atenolol) ..... Once daily  BP today: 136/80 Prior BP: 106/76 (01/02/2010)  Prior 10 Yr Risk Heart Disease: 13 %  (07/05/2009)  Labs Reviewed: K+: 4.2 (12/19/2009) Creat: : 1.2 (12/19/2009)   Chol: 128 (12/19/2009)   HDL: 40.70 (12/19/2009)   LDL: 70 (12/19/2009)   TG: 85.0 (12/19/2009)  Problem # 3:  HYPERLIPIDEMIA NEC/NOS (ICD-272.4) Assessment: Unchanged the use of zocar and interactions discussed Her updated medication list for this problem includes:    Zocor 20 Mg Tabs (Simvastatin) ..... One by mouth daily  Labs Reviewed:  at goals SGOT: 19 (12/19/2009)   SGPT: 17 (12/19/2009)  Lipid Goals: Chol Goal: 200 (11/08/2007)   HDL Goal: 40 (11/08/2007)   LDL Goal: 130 (11/08/2007)   TG Goal: 150 (11/08/2007)  Prior 10 Yr Risk Heart Disease: 13 % (07/05/2009)   HDL:40.70 (12/19/2009), 34.50 (06/28/2009)  LDL:70 (12/19/2009), 95 (06/28/2009)  Chol:128 (12/19/2009), 150 (06/28/2009)  Trig:85.0 (12/19/2009), 102.0 (06/28/2009)  Complete Medication List: 1)  Adult Aspirin Ec Low Strength 81 Mg Tbec (Aspirin) .... Once daily 2)  Lisinopril-hydrochlorothiazide 20-25 Mg Tabs (Lisinopril-hydrochlorothiazide) .... Once daily 3)  Atenolol 50 Mg Tabs (Atenolol) .... Once daily 4)  Zocor 20 Mg Tabs (Simvastatin) .... One by mouth daily 5)  Trimethoprim 100 Mg Tabs (Trimethoprim) .... As needed 6)  Vitamin D3 2000 Unit Caps (Cholecalciferol) .... Take one tablet by mouth once daily. 7)  Omeprazole 20 Mg Cpdr (Omeprazole) .... Take one tablet by mouth once daily. 8)  Calcium  .... Daily 9)  Slow-mag 71.5-119 Mg Tbec (Magnesium cl-calcium carbonate) .Marland Kitchen.. 1 tablet at bedtime  Patient Instructions: 1)  drink 20 oz before intercourse and urinate immediately afterwards 2)  see the urologist for evalution if this plan is not effective 3)  Please schedule a follow-up appointment in 4 months. 4)  BMP prior to visit, ICD-9:401.90 5)  Hepatic Panel prior to visit, ICD-9:995.20 6)  Lipid Panel prior to visit, ICD-9:272.4

## 2010-11-12 NOTE — Assessment & Plan Note (Signed)
 Summary: 6 MONTH ROV/NJR   Vital Signs:  Patient profile:   70 year old female Height:      66 inches Weight:      173 pounds BMI:     28.02 Temp:     98.2 degrees F oral Pulse rate:   72 / minute Resp:     14 per minute BP sitting:   132 / 80  (left arm)  Vitals Entered By: Kendell CHRISTELLA Pouch, LPN (July 05, 2009 8:06 AM)  Primary Care Provider:  Mavis  CC:  roa labs.  History of Present Illness:  Hyperlipidemia Follow-Up      This is a 70 year old woman who presents for Hyperlipidemia follow-up.  The patient denies muscle aches, GI upset, abdominal pain, flushing, itching, constipation, diarrhea, and fatigue.  The patient denies the following symptoms: chest pain/pressure, exercise intolerance, dypsnea, palpitations, syncope, and pedal edema.  Compliance with medications (by patient report) has been near 100%.  Dietary compliance has been good.  The patient reports no exercise.  Adjunctive measures currently used by the patient include fish oil supplements.    Hypertension History:      She denies headache, chest pain, palpitations, dyspnea with exertion, orthopnea, PND, peripheral edema, visual symptoms, neurologic problems, syncope, and side effects from treatment.        Positive major cardiovascular risk factors include female age 72 years old or older, hyperlipidemia, and hypertension.  Negative major cardiovascular risk factors include negative family history for ischemic heart disease and non-tobacco-user status.     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) 2)  Tick Bite  (ICD-E906.4) 3)  Adenocarcinoma, Colon, Family Hx  (ICD-V16.0) 4)  Colonic Polyps, Adenomatous, Hx of  (ICD-V12.72) 5)  Gerd  (ICD-530.81) 6)  Abdominal Pain, Epigastric  (ICD-789.06) 7)  Gerd  (ICD-530.81) 8)  Osteoarthritis  (ICD-715.90) 9)  Rotator Cuff Tear  (ICD-727.61) 10)  Preventive Health Care  (ICD-V70.0) 11)  Bursitis, Right Shoulder  (ICD-726.10) 12)   Hyperlipidemia  (ICD-272.4) 13)  Hyperlipidemia Nec/nos  (ICD-272.4) 14)  Sprain/strain, Ankle, Calcaneofibular  (ICD-845.02) 15)  Muscle Spasm, Back  (ICD-724.8) 16)  Osteopenia  (ICD-733.90) 17)  Hypertension  (ICD-401.9)  Medications Prior to Update: 1)  Adult Aspirin Ec Low Strength 81 Mg  Tbec (Aspirin) .... Once Daily 2)  Lisinopril-Hydrochlorothiazide  20-25 Mg  Tabs (Lisinopril-Hydrochlorothiazide ) .... Once Daily 3)  Atenolol  50 Mg  Tabs (Atenolol ) .... Once Daily 4)  Zocor  20 Mg  Tabs (Simvastatin ) .... One By Mouth Daily 5)  Liquid Calcium /vitamin D  600-200 Mg-Unit  Caps (Calcium  Carbonate-Vitamin D ) .... 600 Per Day 6)  Trimethoprim  100 Mg  Tabs (Trimethoprim ) .... Once Daily 7)  D 1000 1000 Unit Caps (Cholecalciferol) .... One By Mouth Bid 8)  Omeprazole  20 Mg Cpdr (Omeprazole ) .SABRA.. 1 By Mouth 30 Minutes Before Meals 9)  Doxycycline Hyclate 100 Mg Caps (Doxycycline Hyclate) .... One By Mouth Bid  Current Medications (verified): 1)  Adult Aspirin Ec Low Strength 81 Mg  Tbec (Aspirin) .... Once Daily 2)  Lisinopril-Hydrochlorothiazide  20-25 Mg  Tabs (Lisinopril-Hydrochlorothiazide ) .... Once Daily 3)  Atenolol  50 Mg  Tabs (Atenolol ) .... Once Daily 4)  Zocor  20 Mg  Tabs (Simvastatin ) .... One By Mouth Daily 5)  Trimethoprim  100 Mg  Tabs (Trimethoprim ) .... Once Daily 6)  Vitamin D  2000 Unit Tabs (Cholecalciferol) .SABRA.. 1 Once Daily 7)  Omeprazole  20 Mg Cpdr (Omeprazole ) .SABRA.. 1 By Mouth 30 Minutes Before Meals  Allergies (verified): No Known  Drug Allergies  Past History:  Family History: Last updated: 12/25/2008 father had Hodgkins mother had colon cancer Family History of Diabetes: GM, Aunt Family History of Colon Polyps: Daughter  Social History: Last updated: 11/28/2008 Married Never Smoked Occupation: Semi Retired Alcohol Use - no Illicit Drug Use - no Patient does not get regular exercise.   Risk Factors: Exercise: no (11/28/2008)  Risk  Factors: Smoking Status: never (05/24/2007) Passive Smoke Exposure: no (11/08/2007)  Past medical, surgical, family and social histories (including risk factors) reviewed, and no changes noted (except as noted below).  Past Medical History: Reviewed history from 11/28/2008 and no changes required. Hypertension Osteopenia Palpitation Hyperlipidemia  diet controlled Osteoarthritis GERD Diverticulosis Kidney Stones Pneumonia Urinary Tract Infection  Past Surgical History: Reviewed history from 12/25/2008 and no changes required. kidney stones D and C x 3 shoulder arthroscopy right  Family History: Reviewed history from 12/25/2008 and no changes required. father had Hodgkins mother had colon cancer Family History of Diabetes: GM, Aunt Family History of Colon Polyps: Daughter  Social History: Reviewed history from 11/28/2008 and no changes required. Married Never Smoked Occupation: Semi Retired Alcohol Use - no Illicit Drug Use - no Patient does not get regular exercise.   Review of Systems  The patient denies anorexia, fever, weight loss, weight gain, vision loss, decreased hearing, hoarseness, chest pain, syncope, dyspnea on exertion, peripheral edema, prolonged cough, headaches, hemoptysis, abdominal pain, melena, hematochezia, severe indigestion/heartburn, hematuria, incontinence, genital sores, muscle weakness, suspicious skin lesions, transient blindness, difficulty walking, depression, unusual weight change, abnormal bleeding, enlarged lymph nodes, angioedema, and breast masses.    Physical Exam  General:  Well developed, well nourished, no acute distress. Head:  Normocephalic and atraumatic. Ears:  no external deformities.   Nose:  External nasal examination shows no deformity or inflammation. Nasal mucosa are pink and moist without lesions or exudates. Mouth:  No deformity or lesions, dentition normal. Neck:  Supple; no masses or thyromegaly. Lungs:  Clear  throughout to auscultation. Heart:  Regular rate and rhythm; no murmurs, rubs,  or bruits. Abdomen:  Soft, nontender and nondistended. No masses, hepatosplenomegaly or hernias noted. Normal bowel sounds. Msk:  normal ROM, no joint tenderness, and no joint swelling.   Extremities:  No clubbing, cyanosis, edema or deformities noted. Neurologic:  Alert and  oriented x4;  grossly normal neurologically.   Impression & Recommendations:  Problem # 1:  HYPERLIPIDEMIA (ICD-272.4) improved Her updated medication list for this problem includes:    Zocor  20 Mg Tabs (Simvastatin ) ..... One by mouth daily  Labs Reviewed: SGOT: 20 (11/27/2008)   SGPT: 19 (11/27/2008)  Lipid Goals: Chol Goal: 200 (11/08/2007)   HDL Goal: 40 (11/08/2007)   LDL Goal: 130 (11/08/2007)   TG Goal: 150 (11/08/2007)  10 Yr Risk Heart Disease: 13 % Prior 10 Yr Risk Heart Disease: 17 % (12/26/2008)   HDL:35.2 (11/15/2008), 35.8 (05/11/2008)  LDL:102 (11/15/2008), 99 (92/69/7990)  Chol:158 (11/15/2008), 159 (05/11/2008)  Trig:105 (11/15/2008), 122 (05/11/2008)  Problem # 2:  HYPERTENSION (ICD-401.9)  Her updated medication list for this problem includes:    Lisinopril-hydrochlorothiazide  20-25 Mg Tabs (Lisinopril-hydrochlorothiazide ) ..... Once daily    Atenolol  50 Mg Tabs (Atenolol ) ..... Once daily  BP today: 132/80 Prior BP: 140/80 (12/26/2008)  10 Yr Risk Heart Disease: 13 % Prior 10 Yr Risk Heart Disease: 17 % (12/26/2008)  Labs Reviewed: K+: 3.7 (11/15/2008) Creat: : 0.9 (11/15/2008)   Chol: 158 (11/15/2008)   HDL: 35.2 (11/15/2008)   LDL: 102 (11/15/2008)  TG: 105 (11/15/2008)  Problem # 3:  PREVENTIVE HEALTH CARE (ICD-V70.0) Assessment: Unchanged  Colonoscopy: Results: Polyp.  Results: Diverticulosis.        (11/27/2004) Td Booster: Tdap (05/24/2007)   Flu Vax: Fluvax 3+ (07/28/2008)   Pneumovax: Historical (10/13/2002) Chol: 158 (11/15/2008)   HDL: 35.2 (11/15/2008)   LDL: 102 (11/15/2008)   TG: 105  (11/15/2008) TSH: 2.02 (05/11/2008)   Next Colonoscopy due:: 11/2008 (05/24/2007)  Discussed using sunscreen, use of alcohol, drug use, self breast exam, routine dental care, routine eye care, schedule for GYN exam, routine physical exam, seat belts, multiple vitamins, osteoporosis prevention, adequate calcium  intake in diet, recommendations for immunizations, mammograms and Pap smears.  Discussed exercise and checking cholesterol.  Discussed gun safety, safe sex, and contraception.  Problem # 4:  GERD (ICD-530.81) Assessment: Unchanged  stable Her updated medication list for this problem includes:    Omeprazole  20 Mg Cpdr (Omeprazole ) .SABRA... 1 by mouth 30 minutes before meals  EGD: Fundic gland polyps Esophagitis, likely GERD (11/29/2008)  Labs Reviewed: Hgb: 13.1 (11/27/2008)   Hct: 38.5 (11/27/2008)  Complete Medication List: 1)  Adult Aspirin Ec Low Strength 81 Mg Tbec (Aspirin) .... Once daily 2)  Lisinopril-hydrochlorothiazide  20-25 Mg Tabs (Lisinopril-hydrochlorothiazide ) .... Once daily 3)  Atenolol  50 Mg Tabs (Atenolol ) .... Once daily 4)  Zocor  20 Mg Tabs (Simvastatin ) .... One by mouth daily 5)  Trimethoprim  100 Mg Tabs (Trimethoprim ) .... Once daily 6)  Vitamin D  2000 Unit Tabs (Cholecalciferol) .SABRA.. 1 once daily 7)  Omeprazole  20 Mg Cpdr (Omeprazole ) .SABRA.. 1 by mouth 30 minutes before meals  Other Orders: Flu Vaccine 87yrs + (09341) Admin 1st Vaccine (09528)  Hypertension Assessment/Plan:      The patient's hypertensive risk group is category B: At least one risk factor (excluding diabetes) with no target organ damage.  Her calculated 10 year risk of coronary heart disease is 13 %.  Today's blood pressure is 132/80.  Her blood pressure goal is < 140/90.  Patient Instructions: 1)  Please schedule a follow-up appointment in 6 months.   Immunizations Administered:  Influenza Vaccine # 1:    Vaccine Type: Fluvax 3+    Site: left deltoid    Mfr: [employer]     Dose: 0.5 ml    Route: IM    Exp. Date: 04/11/2010    Lot #: [id number]  Flu Vaccine Consent Questions:    Do you have a history of severe allergic reactions to this vaccine? no    Any prior history of allergic reactions to egg and/or gelatin? no    Do you have a sensitivity to the preservative Thimersol? no    Do you have a past history of Guillan-Barre Syndrome? no    Do you currently have an acute febrile illness? no    Have you ever had a severe reaction to latex? no    Vaccine information given and explained to patient? yes    Are you currently pregnant? no

## 2010-11-12 NOTE — Assessment & Plan Note (Signed)
 Summary: arm hurts/mhf   Vital Signs:  Patient Profile:   70 Years Old Female Height:     66 inches Weight:      175 pounds Temp:     98.2 degrees F oral Pulse rate:   76 / minute Resp:     14 per minute BP sitting:   130 / 80  (left arm)  Vitals Entered By: Kendell CHRISTELLA Pouch, LPN (February 10, 2008 12:29 PM)                 Chief Complaint:  c/o bursitis of rt shoulder.  History of Present Illness: iNCREASED BURSITIS IN THE RIGHT SHOULDER WITH PAIN AND LIMITED RANGE OF MOTION    Current Allergies: No known allergies   Past Medical History:    Reviewed history from 07/01/2007 and no changes required:       Hypertension       Osteopenia       Palpitation       Hyperlipidemia  diet controlled  Past Surgical History:    Reviewed history from 05/24/2007 and no changes required:       kidney stones       DandC x 3   Family History:    Reviewed history from 05/24/2007 and no changes required:       father  had hodkins       mother had colon cancer  Social History:    Reviewed history from 05/24/2007 and no changes required:       Married       Never Smoked    Review of Systems  The patient denies anorexia, fever, weight loss, weight gain, vision loss, decreased hearing, hoarseness, chest pain, syncope, dyspnea on exhertion, peripheral edema, prolonged cough, hemoptysis, abdominal pain, melena, hematochezia, severe indigestion/heartburn, hematuria, incontinence, genital sores, muscle weakness, suspicious skin lesions, transient blindness, difficulty walking, depression, unusual weight change, abnormal bleeding, enlarged lymph nodes, angioedema, and breast masses.     Physical Exam  General:     Well-developed,well-nourished,in no acute distress; alert,appropriate and cooperative throughout examination Nose:     External nasal examination shows no deformity or inflammation. Nasal mucosa are pink and moist without lesions or exudates. Mouth:     Oral mucosa and  oropharynx without lesions or exudates.  Teeth in good repair. Neck:     No deformities, masses, or tenderness noted. Lungs:     Normal respiratory effort, chest expands symmetrically. Lungs are clear to auscultation, no crackles or wheezes. Heart:     normal rate and regular rhythm.     Shoulder/Elbow Exam  Palpation:    tenderness R-AC:, tenderness R-suprascapular:, and tenderness R-infrascapular:.      Impression & Recommendations:  Problem # 1:  OSTEOPENIA (ICD-733.90) Assessment: Improved  Her updated medication list for this problem includes:    Liquid Calcium /vitamin D  600-200 Mg-unit Caps (Calcium  carbonate-vitamin d ) .SABRASABRASABRASABRA 600 per day    Boniva 150 Mg Tabs (Ibandronate sodium) ..... One a month    Vitamin D3 400 Unit Tabs (Cholecalciferol) ..... Once daily   Problem # 2:  BURSITIS, RIGHT SHOULDER (ICD-726.10)  Orders: Joint Aspirate / Injection, Large (20610) Depo- Medrol  40mg  (J1030)   Problem # 3:  HYPERTENSION (ICD-401.9) Assessment: Unchanged  Her updated medication list for this problem includes:    Lisinopril-hydrochlorothiazide  20-25 Mg Tabs (Lisinopril-hydrochlorothiazide ) ..... Once daily    Atenolol  50 Mg Tabs (Atenolol ) ..... Once daily  BP today: 130/80 Prior BP: 130/80 (11/08/2007)  Prior 10 Yr Risk  Heart Disease: 15 % (11/08/2007)  Labs Reviewed: Chol: 146 (10/27/2007)   HDL: 32.8 (10/27/2007)   LDL: 95 (10/27/2007)   TG: 89 (10/27/2007)   Complete Medication List: 1)  Adult Aspirin Ec Low Strength 81 Mg Tbec (Aspirin) .... Once daily 2)  Lisinopril-hydrochlorothiazide  20-25 Mg Tabs (Lisinopril-hydrochlorothiazide ) .... Once daily 3)  Atenolol  50 Mg Tabs (Atenolol ) .... Once daily 4)  Zocor  20 Mg Tabs (Simvastatin ) .... One by mouth daily 5)  Liquid Calcium /vitamin D  600-200 Mg-unit Caps (Calcium  carbonate-vitamin d ) .... 600 per day 6)  Prilosec 20 Mg Cpdr (Omeprazole ) .... Once daily 7)  Boniva 150 Mg Tabs (Ibandronate sodium) .... One  a month 8)  Trimethoprim  100 Mg Tabs (Trimethoprim ) .... Once daily 9)  Vitamin D3 400 Unit Tabs (Cholecalciferol) .... Once daily     ]

## 2010-11-12 NOTE — Assessment & Plan Note (Signed)
 Summary: 4 MONTH ROV/NJR   Vital Signs:  Patient Profile:   70 Years Old Female Height:     66 inches Weight:      180 pounds Temp:     98.5 degrees F oral Pulse rate:   76 / minute Resp:     14 per minute BP sitting:   130 / 80  (left arm)  Vitals Entered By: Kendell CHRISTELLA Pouch, LPN (August 28, 2008 1:34 PM)                 Chief Complaint:  roa .  History of Present Illness: follow up visit  Follow-Up Visit      This is a 70 year old woman who presents for Follow-up visit.  pt had shoulder surgery with Dr Melita ( the shoulder was frozen and had a spur).  The patient denies chest pain, palpitations, dizziness, syncope, low blood sugar symptoms, high blood sugar symptoms, edema, SOB, DOE, PND, and orthopnea.  Since the last visit the patient notes no new problems or concerns.  The patient reports taking meds as prescribed, monitoring BP, and dietary compliance.  When questioned about possible medication side effects, the patient notes none.    Hypertension History:      She denies headache, chest pain, palpitations, dyspnea with exertion, orthopnea, PND, peripheral edema, visual symptoms, neurologic problems, syncope, and side effects from treatment.        Positive major cardiovascular risk factors include female age 68 years old or older, hyperlipidemia, and hypertension.  Negative major cardiovascular risk factors include negative family history for ischemic heart disease and non-tobacco-user status.       Current Allergies: No known allergies   Past Medical History:    Hypertension    Osteopenia    Palpitation    Hyperlipidemia  diet controlled    Osteoarthritis  Past Surgical History:    Reviewed history from 05/24/2007 and no changes required:       kidney stones       DandC x 3       shoulder arthroscopy right   Family History:    Reviewed history from 05/24/2007 and no changes required:       father  had hodkins       mother had colon cancer  Social  History:    Reviewed history from 05/24/2007 and no changes required:       Married       Never Smoked   Risk Factors: Tobacco use:  never Passive smoke exposure:  no Drug use:  no  Family History Risk Factors:    Family History of MI in females < 58 years old:  no    Family History of MI in males < 23 years old:  no  Colonoscopy History:    Date of Last Colonoscopy:  11/27/2004   Review of Systems  The patient denies anorexia, fever, weight loss, weight gain, vision loss, decreased hearing, hoarseness, chest pain, syncope, dyspnea on exertion, peripheral edema, prolonged cough, headaches, hemoptysis, abdominal pain, melena, hematochezia, severe indigestion/heartburn, hematuria, incontinence, genital sores, muscle weakness, suspicious skin lesions, transient blindness, difficulty walking, depression, unusual weight change, abnormal bleeding, enlarged lymph nodes, angioedema, and breast masses.     Physical Exam  General:     Well-developed,well-nourished,in no acute distress; alert,appropriate and cooperative throughout examination Head:     Normocephalic and atraumatic without obvious abnormalities. No apparent alopecia or balding. Ears:     no external deformities.   Nose:  External nasal examination shows no deformity or inflammation. Nasal mucosa are pink and moist without lesions or exudates. Mouth:     Oral mucosa and oropharynx without lesions or exudates.  Teeth in good repair. Neck:     No deformities, masses, or tenderness noted. Lungs:     Normal respiratory effort, chest expands symmetrically. Lungs are clear to auscultation, no crackles or wheezes. Heart:     normal rate and regular rhythm.   Abdomen:     non-tender.  no masses and no guarding.      Impression & Recommendations:  Problem # 1:  HYPERLIPIDEMIA (ICD-272.4) Assessment: Unchanged  Her updated medication list for this problem includes:    Zocor  20 Mg Tabs (Simvastatin ) ..... One by mouth  daily  Labs Reviewed: Chol: 159 (05/11/2008)   HDL: 35.8 (05/11/2008)   LDL: 99 (05/11/2008)   TG: 122 (05/11/2008) SGOT: 22 (05/11/2008)   SGPT: 22 (05/11/2008)  Lipid Goals: Chol Goal: 200 (11/08/2007)   HDL Goal: 40 (11/08/2007)   LDL Goal: 130 (11/08/2007)   TG Goal: 150 (11/08/2007)  10 Yr Risk Heart Disease: 9 % Prior 10 Yr Risk Heart Disease: 15 % (11/08/2007)   Problem # 2:  OSTEOPENIA (ICD-733.90) Assessment: Unchanged  Her updated medication list for this problem includes:    Liquid Calcium /vitamin D  600-200 Mg-unit Caps (Calcium  carbonate-vitamin d ) .SABRASABRASABRASABRA 600 per day    Boniva 150 Mg Tabs (Ibandronate sodium) ..... One a month    Vitamin D3 400 Unit Tabs (Cholecalciferol) ..... Once daily   Problem # 3:  HYPERTENSION (ICD-401.9)  Her updated medication list for this problem includes:    Lisinopril-hydrochlorothiazide  20-25 Mg Tabs (Lisinopril-hydrochlorothiazide ) ..... Once daily    Atenolol  50 Mg Tabs (Atenolol ) ..... Once daily  BP today: 130/80 Prior BP: 136/80 (05/11/2008)  10 Yr Risk Heart Disease: 9 % Prior 10 Yr Risk Heart Disease: 15 % (11/08/2007)  Labs Reviewed: Creat: 1.0 (05/11/2008) Chol: 159 (05/11/2008)   HDL: 35.8 (05/11/2008)   LDL: 99 (05/11/2008)   TG: 122 (05/11/2008)   Problem # 4:  OSTEOARTHRITIS (ICD-715.90)  Her updated medication list for this problem includes:    Adult Aspirin Ec Low Strength 81 Mg Tbec (Aspirin) ..... Once daily Discussed use of medications, application of heat or cold, and exercises.   Complete Medication List: 1)  Adult Aspirin Ec Low Strength 81 Mg Tbec (Aspirin) .... Once daily 2)  Lisinopril-hydrochlorothiazide  20-25 Mg Tabs (Lisinopril-hydrochlorothiazide ) .... Once daily 3)  Atenolol  50 Mg Tabs (Atenolol ) .... Once daily 4)  Zocor  20 Mg Tabs (Simvastatin ) .... One by mouth daily 5)  Liquid Calcium /vitamin D  600-200 Mg-unit Caps (Calcium  carbonate-vitamin d ) .... 600 per day 6)  Prilosec 20 Mg Cpdr  (Omeprazole ) .... Once daily 7)  Boniva 150 Mg Tabs (Ibandronate sodium) .... One a month 8)  Trimethoprim  100 Mg Tabs (Trimethoprim ) .... Once daily 9)  Vitamin D3 400 Unit Tabs (Cholecalciferol) .... Once daily  Hypertension Assessment/Plan:      The patient's hypertensive risk group is category B: At least one risk factor (excluding diabetes) with no target organ damage.  Her calculated 10 year risk of coronary heart disease is 9 %.  Today's blood pressure is 130/80.  Her blood pressure goal is < 140/90.   Patient Instructions: 1)  FEB 2010 2)  BMP prior to visit, ICD-9:  401.90 3)  Hepatic Panel prior to visit, ICD-9:995.20 4)  Lipid Panel prior to visit, ICD-9:272.4 5)  Urine-dip prior to visit, ICD-9:401.90   ]

## 2010-11-12 NOTE — Miscellaneous (Signed)
Summary: [facility] Previsit/prep  Clinical Lists Changes  Medications: Added new medication of MOVIPREP 100 GM  SOLR (PEG-KCL-NACL-NASULF-NA ASC-C) As per prep instructions. - Signed Rx of MOVIPREP 100 GM  SOLR (PEG-KCL-NACL-NASULF-NA ASC-C) As per prep instructions.;  #1 x 0;  Signed;  Entered by: Wyona Almas RN;  Authorized by: Iva Boop MD, [facility];  Method used: Electronically to [facility] [phone]*, [address] [place], [place]  [address], Ph: [phone], Fax: [phone] Allergies: Added new allergy or adverse reaction of LEVAQUIN Observations: Added new observation of ALLERGY REV: Done (12/06/2009 15:45) Added new observation of NKA: F (12/06/2009 15:45)    Prescriptions: MOVIPREP 100 GM  SOLR (PEG-KCL-NACL-NASULF-NA ASC-C) As per prep instructions.  #1 x 0   Entered by:   Wyona Almas RN   Authorized by:   Iva Boop MD, [facility]   Signed by:   Wyona Almas RN on 12/06/2009   Method used:   Electronically to        [facility] [phone]* (retail)       [address]       [place], [place]  [address]       Ph: [phone]       Fax: [phone]   RxID:   [id number]

## 2010-11-12 NOTE — Assessment & Plan Note (Signed)
Summary: cpx/njr [facility] BMP/NJR   Vital Signs:  Patient profile:   70 year old female Height:      66 inches Weight:      176 pounds BMI:     28.51 Temp:     98.2 degrees F oral Pulse rate:   68 / minute Resp:     14 per minute BP sitting:   126 / 74  (left arm)  Vitals Entered By: Willy Eddy, LPN (December 26, 2009 11:08 AM) CC: annual visit for disease management   Primary Care Provider:  Lovell Sheehan  CC:  annual visit for disease management.  History of Present Illness: The pt was asked about all immunizations, health maint. 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) 3)  Tick Bite  (ICD-E906.4) 4)  Adenocarcinoma, Colon, Family Hx  (ICD-V16.0) 5)  Colonic Polyps, Adenomatous, Hx of  (ICD-V12.72) 6)  Gerd  (ICD-530.81) 7)  Abdominal Pain, Epigastric  (ICD-789.06) 8)  Gerd  (ICD-530.81) 9)  Osteoarthritis  (ICD-715.90) 10)  Rotator Cuff Tear  (ICD-727.61) 11)  Preventive Health Care  (ICD-V70.0) 12)  Bursitis, Right Shoulder  (ICD-726.10) 13)  Hyperlipidemia  (ICD-272.4) 14)  Hyperlipidemia Nec/nos  (ICD-272.4) 15)  Sprain/strain, Ankle, Calcaneofibular  (ICD-845.02) 16)  Muscle Spasm, Back  (ICD-724.8) 17)  Osteopenia  (ICD-733.90) 18)  Hypertension  (ICD-401.9)  Current Problems (verified): 1)  Palpitations  (ICD-785.1) 2)  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) 3)  Tick Bite  (ICD-E906.4) 4)  Adenocarcinoma, Colon, Family Hx  (ICD-V16.0) 5)  Colonic Polyps, Adenomatous, Hx of  (ICD-V12.72) 6)  Gerd  (ICD-530.81) 7)  Abdominal Pain,  Epigastric  (ICD-789.06) 8)  Gerd  (ICD-530.81) 9)  Osteoarthritis  (ICD-715.90) 10)  Rotator Cuff Tear  (ICD-727.61) 11)  Preventive Health Care  (ICD-V70.0) 12)  Bursitis, Right Shoulder  (ICD-726.10) 13)  Hyperlipidemia  (ICD-272.4) 14)  Hyperlipidemia Nec/nos  (ICD-272.4) 15)  Sprain/strain, Ankle, Calcaneofibular  (ICD-845.02) 16)  Muscle Spasm, Back  (ICD-724.8) 17)  Osteopenia  (ICD-733.90) 18)  Hypertension  (ICD-401.9)  Medications Prior to Update: 1)  Adult Aspirin Ec Low Strength 81 Mg  Tbec (Aspirin) .... Once Daily 2)  Lisinopril-Hydrochlorothiazide 20-25 Mg  Tabs (Lisinopril-Hydrochlorothiazide) .... Once Daily 3)  Atenolol 50 Mg  Tabs (Atenolol) .... Once Daily 4)  Zocor 20 Mg  Tabs (Simvastatin) .... One By Mouth Daily 5)  Trimethoprim 100 Mg  Tabs (Trimethoprim) .... As Needed 6)  Vitamin D3 2000 Unit Caps (Cholecalciferol) .... Take One Tablet By Mouth Once Daily. 7)  Omeprazole 20 Mg Cpdr (Omeprazole) .... Take One Tablet By Mouth Once Daily. 8)  Calcium .... Daily 9)  Moviprep 100 Gm  Solr (Peg-Kcl-Nacl-Nasulf-Na Asc-C) .... As Per Prep Instructions.  Current Medications (verified): 1)  Adult Aspirin Ec Low Strength 81 Mg  Tbec (Aspirin) .... Once Daily 2)  Lisinopril-Hydrochlorothiazide 20-25 Mg  Tabs (Lisinopril-Hydrochlorothiazide) .... Once Daily 3)  Atenolol 50 Mg  Tabs (Atenolol) .... Once Daily 4)  Zocor 20 Mg  Tabs (Simvastatin) .... One By Mouth Daily 5)  Trimethoprim 100 Mg  Tabs (  Trimethoprim) .... As Needed 6)  Vitamin D3 2000 Unit Caps (Cholecalciferol) .... Take One Tablet By Mouth Once Daily. 7)  Omeprazole 20 Mg Cpdr (Omeprazole) .... Take One Tablet By Mouth Once Daily. 8)  Calcium .... Daily  Allergies: 1)  ! Levaquin 2)  ! Septra (Sulfamethoxazole-Trimethoprim)  Past History:  Family History: Last updated: 12/25/2008 father had Hodgkins mother had colon cancer Family History of Diabetes: GM, Aunt Family History of Colon Polyps:  Daughter  Social History: Last updated: 09/26/2009 Married and lives in [place] with spouse. 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 Alcohol Use - no Illicit Drug Use - no Patient does not get regular exercise.   Risk Factors: Exercise: no (11/28/2008)  Risk Factors: Smoking Status: never (12/26/2009) Passive Smoke Exposure: no (12/26/2009)  Past medical, surgical, family and social histories (including risk factors) reviewed, and no changes noted (except as noted below).  Past Medical History: Reviewed history from 09/26/2009 and no changes required. Hypertension Osteopenia PVCs Hyperlipidemia  diet controlled Osteoarthritis GERD Diverticulosis Kidney Stones Pneumonia Urinary Tract Infection Allergic rhinitis  Past Surgical History: Reviewed history from 09/26/2009 and no changes required. kidney stones 1963 D and C x 3 shoulder arthroscopy right 2009  Family History: Reviewed history from 12/25/2008 and no changes required. father had Hodgkins mother had colon cancer Family History of Diabetes: GM, Aunt Family History of Colon Polyps: Daughter  Social History: Reviewed history from 09/26/2009 and no changes required. Married and lives in [place] with spouse. 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 Alcohol Use - no Illicit Drug Use - no Patient does not get regular exercise.   Review of Systems  The patient denies anorexia, fever, weight loss, weight gain, vision loss, decreased hearing, hoarseness, chest pain, syncope, dyspnea on exertion, peripheral edema, prolonged cough, headaches, hemoptysis, abdominal pain, melena, hematochezia, severe indigestion/heartburn, hematuria, incontinence, genital sores, muscle weakness, suspicious skin lesions, transient blindness, difficulty walking, depression, unusual weight change, abnormal bleeding, enlarged lymph nodes, angioedema, and breast masses.    Physical  Exam  General:  Well developed, well nourished, no acute distress. Head:  Normocephalic and atraumatic. Eyes:  pupils equal and pupils round.   Ears:  no external deformities.  R ear normal and L ear normal.   Nose:  External nasal examination shows no deformity or inflammation. Nasal mucosa are pink and moist without lesions or exudates. Mouth:  No deformity or lesions, dentition normal. Neck:  Supple; no masses or thyromegaly. Chest Wall:  no deformities and no tenderness.   Lungs:  Clear throughout to auscultation. Heart:  Regular rate and rhythm; no murmurs, rubs,  or bruits. Abdomen:  Soft, nontender and nondistended. No masses, hepatosplenomegaly or hernias noted. Normal bowel sounds. Msk:  normal ROM and no joint tenderness.   Pulses:  R and L carotid,radial,femoral,dorsalis pedis and posterior tibial pulses are full and equal bilaterally Extremities:  No clubbing, cyanosis, edema or deformities noted. Neurologic:  Alert and  oriented x4;  grossly normal neurologically.   Impression & Recommendations:  Problem # 1:  HYPERTENSION (ICD-401.9)  Her updated medication list for this problem includes:    Lisinopril-hydrochlorothiazide 20-25 Mg Tabs (Lisinopril-hydrochlorothiazide) ..... Once daily    Atenolol 50 Mg Tabs (Atenolol) ..... Once daily  BP today: 126/74 Prior BP: 148/84 (09/26/2009)  Prior 10 Yr Risk Heart Disease: 13 % (07/05/2009)  Labs Reviewed: K+: 4.2 (12/19/2009) Creat: : 1.2 (12/19/2009)   Chol: 128 (12/19/2009)   HDL: 40.70 (12/19/2009)  LDL: 70 (12/19/2009)   TG: 85.0 (12/19/2009)  Problem # 2:  PREVENTIVE HEALTH CARE (ICD-V70.0) The pt was asked about all immunizations, health maint. services that are appropriate to their age and was given guidance on diet exercize  and weight management  Mammogram: normal (05/12/2009) Pap smear: normal (03/26/2009) Colonoscopy: Results: Polyp.  Results: Diverticulosis.        (11/27/2004) Bone Density: normal  (03/21/2008) Td Booster: Tdap (05/24/2007)   Flu Vax: Fluvax 3+ (07/05/2009)   Pneumovax: Historical (10/13/2002) Chol: 128 (12/19/2009)   HDL: 40.70 (12/19/2009)   LDL: 70 (12/19/2009)   TG: 85.0 (12/19/2009) TSH: 1.86 (12/19/2009)   Next mammogram due:: 05/2010 (12/26/2009) Next Colonoscopy due:: 11/2008 (05/24/2007) Next Bone Density due:: 03/2010 (12/26/2009)  Discussed using sunscreen, use of alcohol, drug use, self breast exam, routine dental care, routine eye care, schedule for GYN exam, routine physical exam, seat belts, multiple vitamins, osteoporosis prevention, adequate calcium intake in diet, recommendations for immunizations, mammograms and Pap smears.  Discussed exercise and checking cholesterol.  Discussed gun safety, safe sex, and contraception.  Complete Medication List: 1)  Adult Aspirin Ec Low Strength 81 Mg Tbec (Aspirin) .... Once daily 2)  Lisinopril-hydrochlorothiazide 20-25 Mg Tabs (Lisinopril-hydrochlorothiazide) .... Once daily 3)  Atenolol 50 Mg Tabs (Atenolol) .... Once daily 4)  Zocor 20 Mg Tabs (Simvastatin) .... One by mouth daily 5)  Trimethoprim 100 Mg Tabs (Trimethoprim) .... As needed 6)  Vitamin D3 2000 Unit Caps (Cholecalciferol) .... Take one tablet by mouth once daily. 7)  Omeprazole 20 Mg Cpdr (Omeprazole) .... Take one tablet by mouth once daily. 8)  Calcium  .... Daily  Patient Instructions: 1)  take magnesium 250 mg magnesium at bed time 2)  Please schedule a follow-up appointment in 3 months.   Preventive Care Screening  Bone Density:    Date:  03/21/2008    Next Due:  03/2010    Results:  normal std dev  Mammogram:    Date:  05/12/2009    Next Due:  05/2010    Results:  normal   Pap Smear:    Date:  03/26/2009    Next Due:  03/2010    Results:  normal

## 2010-11-12 NOTE — Assessment & Plan Note (Signed)
 Summary: follow up on blood work/mhf PT [facility] BM9/NJR   Vital Signs:  Patient Profile:   70 Years Old Female Height:     66 inches Weight:      178 pounds Temp:     98.5 degrees F oral Pulse rate:   76 / minute Resp:     14 per minute BP sitting:   120 / 76  (left arm)  Vitals Entered By: Kendell CHRISTELLA Pouch, LPN (November 27, 2008 9:46 AM)                 Chief Complaint:  ro albs- c/o epigastric pain and diarrhea.  History of Present Illness: Had episode of diarrhes this week end   started friday night watery stools, explosive, collor brown Has has increased esophageal pain/burining and is seeing Dr Avram on Tuesday Not dizzy or light headed Taking prilosec two times a day and mallox The pain eases with sitting up    Hypertension History:      She denies headache, chest pain, palpitations, dyspnea with exertion, orthopnea, PND, peripheral edema, visual symptoms, neurologic problems, syncope, and side effects from treatment.        Positive major cardiovascular risk factors include female age 22 years old or older, hyperlipidemia, and hypertension.  Negative major cardiovascular risk factors include negative family history for ischemic heart disease and non-tobacco-user status.    Lipid Management History:      Positive NCEP/ATP III risk factors include female age 78 years old or older, HDL cholesterol less than 40, and hypertension.  Negative NCEP/ATP III risk factors include no family history for ischemic heart disease and non-tobacco-user status.        Prior Medication List:  ADULT ASPIRIN EC LOW STRENGTH 81 MG  TBEC (ASPIRIN) once daily LISINOPRIL-HYDROCHLOROTHIAZIDE  20-25 MG  TABS (LISINOPRIL-HYDROCHLOROTHIAZIDE ) once daily ATENOLOL  50 MG  TABS (ATENOLOL ) once daily ZOCOR  20 MG  TABS (SIMVASTATIN ) one by mouth daily LIQUID CALCIUM /VITAMIN D  600-200 MG-UNIT  CAPS (CALCIUM  CARBONATE-VITAMIN D ) 600 per day PRILOSEC 20 MG  CPDR (OMEPRAZOLE ) once daily BONIVA 150 MG   TABS (IBANDRONATE SODIUM) one a month TRIMETHOPRIM  100 MG  TABS (TRIMETHOPRIM ) once daily VITAMIN D3 400 UNIT  TABS (CHOLECALCIFEROL) once daily   Current Allergies (reviewed today): No known allergies   Past Medical History:    Reviewed history from 08/28/2008 and no changes required:       Hypertension       Osteopenia       Palpitation       Hyperlipidemia  diet controlled       Osteoarthritis       GERD   Family History:    Reviewed history from 05/24/2007 and no changes required:       father  had hodkins       mother had colon cancer  Social History:    Reviewed history from 05/24/2007 and no changes required:       Married       Never Smoked       Impression & Recommendations:  Problem # 1:  OSTEOPENIA (ICD-733.90)  The following medications were removed from the medication list:    Boniva 150 Mg Tabs (Ibandronate sodium) ..... One a month  Her updated medication list for this problem includes:    Liquid Calcium /vitamin D  600-200 Mg-unit Caps (Calcium  carbonate-vitamin d ) .SABRASABRASABRASABRA 600 per day    D 1000 1000 Unit Caps (Cholecalciferol) ..... One by mouth bid   Problem # 2:  GERD (ICD-530.81) was on two times a day prilosec and mallox with continued  GERD symptoms as decribed in the HPI Her updated medication list for this problem includes:    Prilosec 20 Mg Cpdr (Omeprazole ) ..... Once daily    Sucralfate  1 Gm/59ml Susp (Sucralfate ) .SABRASABRASABRASABRA 10 cc by mouth qac and hs  Diagnostics Reviewed:  Discussed lifestyle modifications, diet, antacids/medications, and preventive measures. Handout provided.  referral to GI,   DC the bonivia and consider EGD  DC the prilosec and try carafate  sl[place] Orders: TLB-CBC Platelet - w/Differential (85025-CBCD) TLB-Hepatic/Liver Function Pnl (80076-HEPATIC) Venipuncture (63584)   Problem # 3:  HYPERLIPIDEMIA (ICD-272.4)  Her updated medication list for this problem includes:    Zocor  20 Mg Tabs (Simvastatin ) ..... One by  mouth daily  Labs Reviewed: Chol: 158 (11/15/2008)   HDL: 35.2 (11/15/2008)   LDL: 102 (11/15/2008)   TG: 105 (11/15/2008) SGOT: 21 (11/15/2008)   SGPT: 18 (11/15/2008)  Lipid Goals: Chol Goal: 200 (11/08/2007)   HDL Goal: 40 (11/08/2007)   LDL Goal: 130 (11/08/2007)   TG Goal: 150 (11/08/2007)  10 Yr Risk Heart Disease: 13 % Prior 10 Yr Risk Heart Disease: 9 % (08/28/2008)   Problem # 4:  HYPERTENSION (ICD-401.9)  Her updated medication list for this problem includes:    Lisinopril-hydrochlorothiazide  20-25 Mg Tabs (Lisinopril-hydrochlorothiazide ) ..... Once daily    Atenolol  50 Mg Tabs (Atenolol ) ..... Once daily  BP today: 120/76 Prior BP: 130/80 (08/28/2008)  10 Yr Risk Heart Disease: 13 % Prior 10 Yr Risk Heart Disease: 9 % (08/28/2008)  Labs Reviewed: Creat: 0.9 (11/15/2008) Chol: 158 (11/15/2008)   HDL: 35.2 (11/15/2008)   LDL: 102 (11/15/2008)   TG: 105 (11/15/2008)   Complete Medication List: 1)  Adult Aspirin Ec Low Strength 81 Mg Tbec (Aspirin) .... Once daily 2)  Lisinopril-hydrochlorothiazide  20-25 Mg Tabs (Lisinopril-hydrochlorothiazide ) .... Once daily 3)  Atenolol  50 Mg Tabs (Atenolol ) .... Once daily 4)  Zocor  20 Mg Tabs (Simvastatin ) .... One by mouth daily 5)  Liquid Calcium /vitamin D  600-200 Mg-unit Caps (Calcium  carbonate-vitamin d ) .... 600 per day 6)  Prilosec 20 Mg Cpdr (Omeprazole ) .... Once daily 7)  Trimethoprim  100 Mg Tabs (Trimethoprim ) .... Once daily 8)  D 1000 1000 Unit Caps (Cholecalciferol) .... One by mouth bid 9)  Sucralfate  1 Gm/34ml Susp (Sucralfate ) .SABRA.. 10 cc by mouth qac and hs  Hypertension Assessment/Plan:      The patient's hypertensive risk group is category B: At least one risk factor (excluding diabetes) with no target organ damage.  Her calculated 10 year risk of coronary heart disease is 13 %.  Today's blood pressure is 120/76.  Her blood pressure goal is < 140/90.  Lipid Assessment/Plan:      Based on NCEP/ATP III, the  patient's risk factor category is 2 or more risk factors and a calculated 10 year CAD risk of < 20%.  From this information, the patient's calculated lipid goals are as follows: Total cholesterol goal is 200; LDL cholesterol goal is 130; HDL cholesterol goal is 40; Triglyceride goal is 150.     Patient Instructions: 1)  Sotp the boniva and hold the prilosec until seen 2)  Please schedule a follow-up appointment in 1 month.   Prescriptions: SUCRALFATE  1 GM/10ML SUSP (SUCRALFATE ) 10 cc by mouth qAC and HS  #400cc x 3   Entered and Authorized by:   Norleen FORBES Budge MD   Signed by:   Norleen FORBES Budge MD on 11/27/2008   Method used:  Electronically to        [facility] [phone] (retail)       [address]       [place], [place]  [address]       Ph: [phone]       Fax: [phone]   RxID:   [id number]

## 2010-11-12 NOTE — Letter (Signed)
 Summary: Patient [facility] Biopsy Results  [facility]  [address] [place], [place] [address]   Phone: [phone]  Fax: [phone]        December 08, 2008 MRN: [id number]    [facility] [address] [place], [place]  [address]    Dear Ms. Coppock,  I am pleased to inform you that the biopsies taken during your recent endoscopic examination did not show any evidence of cancer upon pathologic examination.  The sophagus was inflamed, probably from acid reflux. The stomach polyps were benign and need no specific follow-up. I will see you at your return visit and I hope you are better.  Please call us  if you are having persistent problems or have questions about your condition that have not been fully answered at this time.    Sincerely,  Lupita FORBES Commander MD  This letter has been electronically signed by your physician.

## 2010-11-12 NOTE — Assessment & Plan Note (Signed)
 Summary: back pain/njr  Medications Added MEVACOR 40 MG TABS (LOVASTATIN) 1/2 every day NADOLOL 40 MG TABS (NADOLOL)  CALCIUM -VITAMIN D  250-125 MG-UNIT  TABS (CALCIUM  CARBONATE-VITAMIN D ) 2 day PRILOSEC 20 MG  CPDR (OMEPRAZOLE ) once daily RESTORIL 15 MG  CAPS (TEMAZEPAM)  MOBIC 15 MG  TABS (MELOXICAM) one by mouth daily        Vital Signs:  Patient Profile:   70 Years Old Female Height:     66 inches Weight:      170 pounds Temp:     98.1 degrees F Pulse rate:   76 / minute Resp:     14 per minute BP sitting:   132 / 74  (left arm) Cuff size:   regular  Vitals Entered By: Kendell CHRISTELLA Pouch, LPN (May 24, 2007 1:53 PM)               Chief Complaint:  c/o of med back pain.  History of Present Illness: Has experienced mif thoracic back pain in lat. muscles. Pain has been going on for 4 weeks and had acutely worsened last week. No injury known. No similar hx. Palpitations are controled BP good. On Boniva for OP    Hypertension Follow-Up      This is a 70 year old woman who presents for Hypertension follow-up.  The patient denies lightheadedness, urinary frequency, headaches, edema, impotence, rash, and fatigue.  The patient denies the following associated symptoms: chest pain, chest pressure, exercise intolerance, dyspnea, palpitations, syncope, leg edema, and pedal edema.  Compliance with medications (by patient report) has been near 100%.      Past Medical History:    Reviewed history from 04/07/2007 and no changes required:       Hypertension       Osteopenia       Palpitation  Past Surgical History:    Reviewed history and no changes required:       kidney stones       DandC x 3   Family History:    Reviewed history and no changes required:       father  had hodkins       mother had colon cancer  Social History:    Reviewed history and no changes required:       Married       Never Smoked   Risk Factors:  Tobacco use:  never  Colonoscopy  History:     Date of Last Colonoscopy:  11/27/2004    Results:  Results: Polyp.  Results: Diverticulosis.           Review of Systems  The patient denies anorexia, fever, weight loss, vision loss, decreased hearing, hoarseness, chest pain, syncope, dyspnea on exhertion, peripheral edema, prolonged cough, hemoptysis, abdominal pain, melena, hematochezia, severe indigestion/heartburn, hematuria, and incontinence.     Physical Exam  General:     Well-developed,well-nourished,in no acute distress; alert,appropriate and cooperative throughout examination Head:     Normocephalic and atraumatic without obvious abnormalities. No apparent alopecia or balding. Eyes:     vision grossly intact, pupils equal, and pupils round.   Ears:     External ear exam shows no significant lesions or deformities.  Otoscopic examination reveals clear canals, tympanic membranes are intact bilaterally without bulging, retraction, inflammation or discharge. Hearing is grossly normal bilaterally. Nose:     External nasal examination shows no deformity or inflammation. Nasal mucosa are pink and moist without lesions or exudates. Neck:     supple.  Chest Wall:     No deformities, masses, or tenderness noted. Lungs:     Normal respiratory effort, chest expands symmetrically. Lungs are clear to auscultation, no crackles or wheezes. Heart:     normal rate and regular rhythm.   Abdomen:     soft, non-tender, and normal bowel sounds.   Msk:     spasm of mid thoracic back on right Pulses:     R and L carotid,radial,femoral,dorsalis pedis and posterior tibial pulses are full and equal bilaterally Extremities:     No clubbing, cyanosis, edema, or deformity noted with normal full range of motion of all joints.   Skin:     Intact without suspicious lesions or rashes Cervical Nodes:     No lymphadenopathy noted Psych:     Oriented X3 and memory intact for recent and remote.      Impression &  Recommendations:  Problem # 1:  MUSCLE SPASM, BACK (ICD-724.8)  Her updated medication list for this problem includes:    Mobic 15 Mg Tabs (Meloxicam) ..... One by mouth daily  sample of skelaxin and williams back stretches Discussed use of moist heat or ice, modified activities, medications, and stretching/strengthening exercises. Back care instructions given. To be seen in 2 weeks if no improvement; sooner if worsening of symptoms.   Problem # 2:  HYPERTENSION (ICD-401.9)  Her updated medication list for this problem includes:    Lisinopril-hydrochlorothiazide  20-25 Mg Tabs (Lisinopril-hydrochlorothiazide )    Atenolol  50 Mg Tabs (Atenolol )    Nadolol 40 Mg Tabs (Nadolol)  BP today: 132/74  Labs Reviewed: Chol: 166 (01/07/2007)   HDL: 38.4 (01/07/2007)   LDL: 109 (01/07/2007)   TG: 95 (01/07/2007)   Problem # 3:  SPRAIN/STRAIN, ANKLE, CALCANEOFIBULAR (ICD-845.02)  Her updated medication list for this problem includes:    Mobic 15 Mg Tabs (Meloxicam) ..... One by mouth daily  Instructed to use a compression wrap, elevate the affected area, apply ICE for 20 minutes every hour while awake for next 3 days, and rest. Start physical therapy as directed and recheck in 10-14 days if no improvement, sooner if worse.   Complete Medication List: 1)  Asa 81mg   2)  Boniva Tabs (Ibandronate sodium tabs) 3)  Lisinopril-hydrochlorothiazide  20-25 Mg Tabs (Lisinopril-hydrochlorothiazide ) 4)  Atenolol  50 Mg Tabs (Atenolol ) 5)  Mevacor 40 Mg Tabs (Lovastatin) .... 1/2 every day 6)  Nadolol 40 Mg Tabs (Nadolol) 7)  Calcium -vitamin D  250-125 Mg-unit Tabs (Calcium  carbonate-vitamin d ) .... 2 day 8)  Prilosec 20 Mg Cpdr (Omeprazole ) .... Once daily 9)  Restoril 15 Mg Caps (Temazepam) 10)  Mobic 15 Mg Tabs (Meloxicam) .... One by mouth daily  Other Orders: DPT Vaccine (90701) Admin 1st Vaccine (09528)   Patient Instructions: 1)  Take mobic  one a day  this is for antiinflamatory 2)  Take  skelaxin is a muscle relaxant and you take it three time a day. 3)  Prior to the appointment on sept 18...get the following labs 4)  Hepatic Panel prior to visit, ICD-9: 995.20 5)  Lipid Panel prior to visit, ICD-9:272.4    Prescriptions: MOBIC 15 MG  TABS (MELOXICAM) one by mouth daily  #30 x 1   Entered and Authorized by:   Norleen FORBES Budge MD   Signed by:   Norleen FORBES Budge MD on 05/24/2007   Method used:   Electronically sent to ...       [facility]       [address]  [place], [place]  [address]       Ph:        Fax:    RxID:   [id number]        Colonoscopy  Procedure date:  11/27/2004  Findings:      Results: Polyp.  Results: Diverticulosis.         Procedures Next Due Date:    Colonoscopy: 11/2008    Tetanus/Td Immunization History:    Tetanus/Td # 1:  Tdap (05/24/2007)  DPT Vaccine # 4    Vaccine Type: DPT    Site: left deltoid    Dose: 0.5 ml    Route: IM    Given by: Norleen FORBES Budge MD    Exp. Date: 07/30/2009    Lot #: [id number]    VIS given: 02/26/06 version given May 24, 2007.

## 2010-11-12 NOTE — Assessment & Plan Note (Signed)
 Summary: consult before col .SABRA..reflux.SABRAem//ALSO, F/U FROM TRIAGE ON 2...   History of Present Illness Visit Type: new patient Primary GI MD: Lupita Commander MD [facility] Primary Provider: Mavis Chief Complaint: acid reflux History of Present Illness:   70 yo white woman with several weeks to 2 month history of burning chest pain and epigastric pain that radiates to the back at times. Occurs about 1 hour after eating. Not sharp and no cramps. Tried Prilosec OTC 2 times daily and did not help, stools loosened on that. Stopped Bonive (on x yrs) also. Saw Dr. Mavis yesterday and he started Carafate . ? if helping yet. Some bloating also.  All other GI ROS negative.            Prior Medications Reviewed Using: List Brought by Patient  Updated Prior Medication List: ADULT ASPIRIN EC LOW STRENGTH 81 MG  TBEC (ASPIRIN) once daily LISINOPRIL-HYDROCHLOROTHIAZIDE  20-25 MG  TABS (LISINOPRIL-HYDROCHLOROTHIAZIDE ) once daily ATENOLOL  50 MG  TABS (ATENOLOL ) once daily ZOCOR  20 MG  TABS (SIMVASTATIN ) one by mouth daily LIQUID CALCIUM /VITAMIN D  600-200 MG-UNIT  CAPS (CALCIUM  CARBONATE-VITAMIN D ) 600 per day TRIMETHOPRIM  100 MG  TABS (TRIMETHOPRIM ) once daily D 1000 1000 UNIT CAPS (CHOLECALCIFEROL) one by mouth BID SUCRALFATE  1 GM/10ML SUSP (SUCRALFATE ) 10 cc by mouth qAC and HS  Current Allergies: No known allergies  Past Medical History:    Hypertension    Osteopenia    Palpitation    Hyperlipidemia  diet controlled    Osteoarthritis    GERD    Diverticulosis    Kidney Stones    Pneumonia    Urinary Tract Infection  Past Surgical History:    Reviewed history from 08/28/2008 and no changes required:       kidney stones       DandC x 3       shoulder arthroscopy right   Family History:    father  had hodkins    mother had colon cancer    Family History of Diabetes: GM, Aunt  Social History:    Married    Never Smoked    Occupation: Semi Retired    Alcohol Use - no    Illicit  Drug Use - no    Patient does not get regular exercise.   Risk Factors:  Drug use:  no Alcohol use:  no Exercise:  no  Review of Systems       The patient complains of arthritis/joint pain, skin rash, and sleeping problems.         All other ROS negative except as per HPI.   Vital Signs:  Patient Profile:   70 Years Old Female Height:     66 inches Weight:      179.25 pounds BMI:     29.04 Pulse rate:   72 / minute Pulse rhythm:   regular BP sitting:   110 / 60  (left arm)  Vitals Entered By: June McMurray CMA (November 28, 2008 2:52 PM)                  Physical Exam  General:     Well developed, well nourished, no acute distress. Head:     Normocephalic and atraumatic. Eyes:     PERRLA, no icterus. Mouth:     No deformity or lesions, dentition normal. Neck:     Supple; no masses or thyromegaly. Lungs:     Clear throughout to auscultation. Heart:     Regular rate and rhythm; no murmurs, rubs,  or bruits. Abdomen:     Soft, nontender and nondistended. No masses, hepatosplenomegaly or hernias noted. Normal bowel sounds. Extremities:     No clubbing, cyanosis, edema or deformities noted. Neurologic:     Alert and  oriented x4;  grossly normal neurologically. Cervical Nodes:     No significant cervical or supraclavicular adenopathy.  Psych:     Alert and cooperative. Normal mood and affect.  CBC and LFTs normal yesterday, old records and Dr. Mavis' 2/15 note reviewed.  Impression & Recommendations:  Problem # 1:  CHEST PAIN (ICD-786.50) Assessment: New Sounds like GERD vs. other esophagitis. ? gallbladder but not typical for t6hat (burning pain). Somewhat unusual for Boniva to cause problems years after use but possible. Risks, benefits,and indications of endoscopic procedure(s) were reviewed with the patient and all questions answered.  Orders: EGD (EGD) If this is negative then US  to look for gallstones might make sense.   Problem # 2:   ABDOMINAL PAIN, EPIGASTRIC (ICD-789.06) Assessment: New Sounds like GERD vs. other esophagitis. ? gallbladder but not typical for t6hat (burning pain). Somewhat unusual for Boniva to cause problems years after use but possible. Orders: EGD (EGD) Sounds like GERD vs. other esophagitis. ? gallbladder but not typical for t6hat (burning pain). Somewhat unusual for Boniva to cause problems years after use but possible.   Problem # 3:  GERD (ICD-530.81) Assessment: Comment Only  Problem # 4:  COLONIC POLYPS, ADENOMATOUS, HX OF (ICD-V12.72) Assessment: Comment Only I think 2011 reasonable (5 yrs after last exam with diminutive adenoma) is appropriate. She will probably go this route.  Problem # 5:  ADENOCARCINOMA, COLON, FAMILY HX (ICD-V16.0) Assessment: Comment Only

## 2010-11-12 NOTE — Progress Notes (Signed)
 Summary: MRI results   Phone Note Call from Patient Call back at Home Phone [phone]   Caller: pt live Call For: Mavis Summary of Call: MRI results  Initial call taken by: Venus Roan,  May 18, 2008 4:41 PM  New Problems: ROTATOR CUFF TEAR (ICD-727.61)   New Problems: ROTATOR CUFF TEAR (ICD-727.61)     Appended Document: MRI results  pt notified and referral sent to yolanda

## 2010-11-12 NOTE — Assessment & Plan Note (Signed)
Summary: 4 month rov/njr   Vital Signs:  Patient profile:   70 year old female Height:      66 inches Weight:      180 pounds BMI:     29.16 Temp:     98.2 degrees F oral Pulse rate:   72 / minute Resp:     14 per minute BP sitting:   130 / 80  (left arm)  Vitals Entered By: Willy Eddy, LPN (July 26, 2010 8:25 AM) CC: roa labs, Lipid Management, Hypertension Management Is Patient Diabetic? No   Primary Care Provider:  Lovell Sheehan  CC:  roa labs, Lipid Management, and Hypertension Management.  History of Present Illness: Pt presents for folow up of cholestrol and HTN The pt has noted some traces of blood in panties had an exam with GYN went to [facility] as well and had a good exam with cystoscopy and CT no pathology seen   Hypertension History:      She denies headache, chest pain, palpitations, dyspnea with exertion, orthopnea, PND, peripheral edema, visual symptoms, neurologic problems, syncope, and side effects from treatment.        Positive major cardiovascular risk factors include female age 13 years old or older, hyperlipidemia, and hypertension.  Negative major cardiovascular risk factors include negative family history for ischemic heart disease and non-tobacco-user status.        Further assessment for target organ damage reveals no history of ASHD, stroke/TIA, or peripheral vascular disease.    Lipid Management History:      Positive NCEP/ATP III risk factors include female age 63 years old or older, HDL cholesterol less than 40, and hypertension.  Negative NCEP/ATP III risk factors include no family history for ischemic heart disease, non-tobacco-user status, no ASHD (atherosclerotic heart disease), no prior stroke/TIA, no peripheral vascular disease, and no history of aortic aneurysm.      Preventive Screening-Counseling & Management  Alcohol-Tobacco     Smoking Status: never     Passive Smoke Exposure: no  Current Problems (verified): 1)  Cystitis,  Chronic  (ICD-595.2) 2)  Palpitations  (ICD-785.1) 3)  Adenocarcinoma, Colon, Family Hx  (ICD-V16.0) 4)  Colonic Polyps, Adenomatous, Hx of  (ICD-V12.72) 5)  Gerd  (ICD-530.81) 6)  Abdominal Pain, Epigastric  (ICD-789.06) 7)  Gerd  (ICD-530.81) 8)  Osteoarthritis  (ICD-715.90) 9)  Rotator Cuff Tear  (ICD-727.61) 10)  Preventive Health Care  (ICD-V70.0) 11)  Bursitis, Right Shoulder  (ICD-726.10) 12)  Hyperlipidemia  (ICD-272.4) 13)  Muscle Spasm, Back  (ICD-724.8) 14)  Osteopenia  (ICD-733.90) 15)  Hypertension  (ICD-401.9)  Current Medications (verified): 1)  Adult Aspirin Ec Low Strength 81 Mg  Tbec (Aspirin) .... Once Daily 2)  Lisinopril-Hydrochlorothiazide 20-25 Mg  Tabs (Lisinopril-Hydrochlorothiazide) .... Once Daily 3)  Atenolol 50 Mg  Tabs (Atenolol) .... Once Daily 4)  Zocor 20 Mg  Tabs (Simvastatin) .... One By Mouth Daily 5)  Cephalexin 500 Mg Caps (Cephalexin) .Marland Kitchen.. 1 After Sex 6)  Vitamin D3 2000 Unit Caps (Cholecalciferol) .... Take One Tablet By Mouth Once Daily. 7)  Omeprazole 20 Mg Cpdr (Omeprazole) .... Take One Tablet By Mouth Once Daily. 8)  Calcium .... Daily 9)  Slow-Mag 71.5-119 Mg Tbec (Magnesium Cl-Calcium Carbonate) .Marland Kitchen.. 1 Tablet At Bedtime  Allergies (verified): 1)  ! Levaquin 2)  ! Septra 3)  ! Sulfa  Past History:  Family History: Last updated: 12/25/2008 father had Hodgkins mother had colon cancer Family History of Diabetes: GM, Aunt Family History of Colon  Polyps: Daughter  Social History: Last updated: 09/26/2009 Married and lives in [place] with spouse. 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 Alcohol Use - no Illicit Drug Use - no Patient does not get regular exercise.   Risk Factors: Exercise: no (11/28/2008)  Risk Factors: Smoking Status: never (07/26/2010) Passive Smoke Exposure: no (07/26/2010)  Past medical, surgical, family and social histories (including risk factors) reviewed, and no changes  noted (except as noted below).  Past Medical History: Reviewed history from 09/26/2009 and no changes required. Hypertension Osteopenia PVCs Hyperlipidemia  diet controlled Osteoarthritis GERD Diverticulosis Kidney Stones Pneumonia Urinary Tract Infection Allergic rhinitis  Past Surgical History: Reviewed history from 09/26/2009 and no changes required. kidney stones 1963 D and C x 3 shoulder arthroscopy right 2009  Family History: Reviewed history from 12/25/2008 and no changes required. father had Hodgkins mother had colon cancer Family History of Diabetes: GM, Aunt Family History of Colon Polyps: Daughter  Social History: Reviewed history from 09/26/2009 and no changes required. Married and lives in [place] with spouse. 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 Alcohol Use - no Illicit Drug Use - no Patient does not get regular exercise.   Review of Systems  The patient denies anorexia, fever, weight loss, weight gain, vision loss, decreased hearing, hoarseness, chest pain, syncope, dyspnea on exertion, peripheral edema, prolonged cough, headaches, hemoptysis, abdominal pain, melena, hematochezia, severe indigestion/heartburn, hematuria, incontinence, genital sores, muscle weakness, suspicious skin lesions, transient blindness, difficulty walking, depression, unusual weight change, abnormal bleeding, enlarged lymph nodes, angioedema, and breast masses.         Flu Vaccine Consent Questions     Do you have a history of severe allergic reactions to this vaccine? no    Any prior history of allergic reactions to egg and/or gelatin? no    Do you have a sensitivity to the preservative Thimersol? no    Do you have a past history of Guillan-Barre Syndrome? no    Do you currently have an acute febrile illness? no    Have you ever had a severe reaction to latex? no    Vaccine information given and explained to patient? yes    Are you currently  pregnant? no    Lot Number:[id number]   Exp Date:04/12/2011   Site Given  Left Deltoid IM   Physical Exam  General:  Well developed, well nourished, no acute distress. Head:  Normocephalic and atraumatic. Eyes:  pupils equal and pupils round.   Ears:  no external deformities.  R ear normal and L ear normal.   Nose:  External nasal examination shows no deformity or inflammation. Nasal mucosa are pink and moist without lesions or exudates. Mouth:  No deformity or lesions, dentition normal. Neck:  Supple; no masses or thyromegaly. Lungs:  Clear throughout to auscultation. Heart:  Regular rate and rhythm; no murmurs, rubs,  or bruits. Abdomen:  Soft, nontender and nondistended. No masses, hepatosplenomegaly or hernias noted. Normal bowel sounds.   Complete Medication List: 1)  Adult Aspirin Ec Low Strength 81 Mg Tbec (Aspirin) .... Once daily 2)  Lisinopril-hydrochlorothiazide 20-25 Mg Tabs (Lisinopril-hydrochlorothiazide) .... Once daily 3)  Atenolol 50 Mg Tabs (Atenolol) .... Once daily 4)  Zocor 20 Mg Tabs (Simvastatin) .... One by mouth daily 5)  Cephalexin 500 Mg Caps (Cephalexin) .Marland Kitchen.. 1 after sex 26)  Vitamin D3 2000 Unit Caps (Cholecalciferol) .... Take one tablet by mouth once daily. 7)  Omeprazole 20 Mg Cpdr (Omeprazole) .... Take  one tablet by mouth once daily. 8)  Calcium  .... Daily 9)  Slow-mag 71.5-119 Mg Tbec (Magnesium cl-calcium carbonate) .Marland Kitchen.. 1 tablet at bedtime  Other Orders: Flu Vaccine 5yrs + MEDICARE PATIENTS (Z6109) Administration Flu vaccine - MCR (G0008) UA Dipstick w/o Micro (automated)  (81003)  Hypertension Assessment/Plan:      The patient's hypertensive risk group is category B: At least one risk factor (excluding diabetes) with no target organ damage.  Her calculated 10 year risk of coronary heart disease is 9 %.  Today's blood pressure is 130/80.  Her blood pressure goal is < 140/90.  Lipid Assessment/Plan:      Based on NCEP/ATP III, the patient's  risk factor category is "2 or more risk factors and a calculated 10 year CAD risk of < 20%".  The patient's lipid goals are as follows: Total cholesterol goal is 200; LDL cholesterol goal is 130; HDL cholesterol goal is 40; Triglyceride goal is 150.  Her LDL cholesterol goal has been met.    Patient Instructions: 1)  March  CPX    Appended Document: Orders Update     Clinical Lists Changes  Observations: Added new observation of COMMENTS: Wynona Canes, CMA  July 26, 2010 11:32 AM  (07/26/2010 11:30) Added new observation of UA COLOR: yellow (07/26/2010 11:30) Added new observation of APPEARANCE U: Clear (07/26/2010 11:30) Added new observation of PH URINE: 5.0  (07/26/2010 11:30) Added new observation of SPEC GR URIN: 1.020  (07/26/2010 11:30) Added new observation of WBC DIPSTK U: negative  (07/26/2010 11:30) Added new observation of NITRITE URN: negative  (07/26/2010 11:30) Added new observation of UROBILINOGEN: 0.2  (07/26/2010 11:30) Added new observation of PROTEIN, URN: negative  (07/26/2010 11:30) Added new observation of BLOOD UR DIP: trace-lysed  (07/26/2010 11:30) Added new observation of KETONES URN: negative  (07/26/2010 11:30) Added new observation of BILIRUBIN UR: negative  (07/26/2010 11:30) Added new observation of GLUCOSE, URN: negative  (07/26/2010 11:30)      Laboratory Results   Urine Tests  Date/Time Recieved: July 26, 2010 11:32 AM  Date/Time Reported: July 26, 2010 11:32 AM   Routine Urinalysis   Color: yellow Appearance: Clear Glucose: negative   (Normal Range: Negative) Bilirubin: negative   (Normal Range: Negative) Ketone: negative   (Normal Range: Negative) Spec. Gravity: 1.020   (Normal Range: 1.003-1.035) Blood: trace-lysed   (Normal Range: Negative) pH: 5.0   (Normal Range: 5.0-8.0) Protein: negative   (Normal Range: Negative) Urobilinogen: 0.2   (Normal Range: 0-1) Nitrite: negative   (Normal Range: Negative) Leukocyte  Esterace: negative   (Normal Range: Negative)    Comments: Wynona Canes, CMA  July 26, 2010 11:32 AM

## 2010-11-12 NOTE — Assessment & Plan Note (Signed)
 Summary: check tick bite/bmw   Vital Signs:  Patient profile:   70 year old female Temp:     98.4 degrees F Pulse rate:   76 / minute  Vitals Entered By: Kendell CHRISTELLA Pouch, LPN (January 09, 2009 11:45 AM)  Primary Care Provider:  Mavis  CC:  red are on fold of rtthiigh area from tick bite about 5 days ago.  History of Present Illness: tick bite 2 weeks ago wiht rash appearing at the site no fever or chills myalgia no hx of prior tick fever HTN, lipis and GERD hx no drug allergies  Problems Prior to Update: 1)  Adenocarcinoma, Colon, Family Hx  (ICD-V16.0) 2)  Colonic Polyps, Adenomatous, Hx of  (ICD-V12.72) 3)  Gerd  (ICD-530.81) 4)  Abdominal Pain, Epigastric  (ICD-789.06) 5)  Gerd  (ICD-530.81) 6)  Osteoarthritis  (ICD-715.90) 7)  Rotator Cuff Tear  (ICD-727.61) 8)  Preventive Health Care  (ICD-V70.0) 9)  Bursitis, Right Shoulder  (ICD-726.10) 10)  Hyperlipidemia  (ICD-272.4) 11)  Hyperlipidemia Nec/nos  (ICD-272.4) 12)  Sprain/strain, Ankle, Calcaneofibular  (ICD-845.02) 13)  Muscle Spasm, Back  (ICD-724.8) 14)  Osteopenia  (ICD-733.90) 15)  Hypertension  (ICD-401.9)  Medications Prior to Update: 1)  Adult Aspirin Ec Low Strength 81 Mg  Tbec (Aspirin) .... Once Daily 2)  Lisinopril-Hydrochlorothiazide  20-25 Mg  Tabs (Lisinopril-Hydrochlorothiazide ) .... Once Daily 3)  Atenolol  50 Mg  Tabs (Atenolol ) .... Once Daily 4)  Zocor  20 Mg  Tabs (Simvastatin ) .... One By Mouth Daily 5)  Liquid Calcium /vitamin D  600-200 Mg-Unit  Caps (Calcium  Carbonate-Vitamin D ) .... 600 Per Day 6)  Trimethoprim  100 Mg  Tabs (Trimethoprim ) .... Once Daily 7)  D 1000 1000 Unit Caps (Cholecalciferol) .... One By Mouth Bid 8)  Omeprazole  20 Mg Cpdr (Omeprazole ) .SABRA.. 1 By Mouth 30 Minutes Before Meals  Current Medications (verified): 1)  Adult Aspirin Ec Low Strength 81 Mg  Tbec (Aspirin) .... Once Daily 2)  Lisinopril-Hydrochlorothiazide  20-25 Mg  Tabs (Lisinopril-Hydrochlorothiazide ) ....  Once Daily 3)  Atenolol  50 Mg  Tabs (Atenolol ) .... Once Daily 4)  Zocor  20 Mg  Tabs (Simvastatin ) .... One By Mouth Daily 5)  Liquid Calcium /vitamin D  600-200 Mg-Unit  Caps (Calcium  Carbonate-Vitamin D ) .... 600 Per Day 6)  Trimethoprim  100 Mg  Tabs (Trimethoprim ) .... Once Daily 7)  D 1000 1000 Unit Caps (Cholecalciferol) .... One By Mouth Bid 8)  Omeprazole  20 Mg Cpdr (Omeprazole ) .SABRA.. 1 By Mouth 30 Minutes Before Meals 9)  Doxycycline Hyclate 100 Mg Caps (Doxycycline Hyclate) .... One By Mouth Bid  Allergies (verified): No Known Drug Allergies  Past History:  Family History:    father had Hodgkins    mother had colon cancer    Family History of Diabetes: GM, Aunt    Family History of Colon Polyps: Daughter     (12/25/2008)  Social History:    Married    Never Smoked    Occupation: Semi Retired    Alcohol Use - no    Illicit Drug Use - no    Patient does not get regular exercise.      (11/28/2008)  Risk Factors:    Alcohol Use: N/A    >5 drinks/d w/in last 3 months: N/A    Caffeine Use: N/A    Diet: N/A    Exercise: no (11/28/2008)  Risk Factors:    Smoking Status: never (05/24/2007)    Packs/Day: N/A    Cigars/wk: N/A    Pipe Use/wk: N/A    Cans  of tobacco/wk: N/A    Passive Smoke Exposure: no (11/08/2007)  Past medical, surgical, family and social histories (including risk factors) reviewed, and no changes noted (except as noted below).  Past Medical History:    Reviewed history from 11/28/2008 and no changes required:    Hypertension    Osteopenia    Palpitation    Hyperlipidemia  diet controlled    Osteoarthritis    GERD    Diverticulosis    Kidney Stones    Pneumonia    Urinary Tract Infection  Past Surgical History:    Reviewed history from 12/25/2008 and no changes required:    kidney stones    D and C x 3    shoulder arthroscopy right  Family History:    Reviewed history from 12/25/2008 and no changes required:       father had Hodgkins        mother had colon cancer       Family History of Diabetes: GM, Aunt       Family History of Colon Polyps: Daughter  Social History:    Reviewed history from 11/28/2008 and no changes required:       Married       Never Smoked       Occupation: Semi Retired       Alcohol Use - no       Illicit Drug Use - no       Patient does not get regular exercise.   Review of Systems  The patient denies anorexia, fever, weight loss, weight gain, vision loss, decreased hearing, hoarseness, chest pain, syncope, dyspnea on exertion, peripheral edema, prolonged cough, headaches, hemoptysis, abdominal pain, melena, hematochezia, severe indigestion/heartburn, hematuria, incontinence, genital sores, muscle weakness, suspicious skin lesions, transient blindness, difficulty walking, depression, unusual weight change, abnormal bleeding, enlarged lymph nodes, angioedema, and breast masses.    Physical Exam  General:  Well developed, well nourished, no acute distress. Eyes:  PERRLA, no icterus. Nose:  External nasal examination shows no deformity or inflammation. Nasal mucosa are pink and moist without lesions or exudates. Mouth:  No deformity or lesions, dentition normal. Neck:  Supple; no masses or thyromegaly. Lungs:  Clear throughout to auscultation. Heart:  Regular rate and rhythm; no murmurs, rubs,  or bruits. Abdomen:  Soft, nontender and nondistended. No masses, hepatosplenomegaly or hernias noted. Normal bowel sounds. Msk:  normal ROM, no joint tenderness, and no joint swelling.   Skin:  red herald patch in right groin Cervical Nodes:  No lymphadenopathy noted Axillary Nodes:  No palpable lymphadenopathy Inguinal Nodes:  R inguinal LN enlarged.   Psych:  Oriented X3 and not anxious appearing.     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 Once daily 2)  Lisinopril-hydrochlorothiazide  20-25 Mg Tabs (Lisinopril-hydrochlorothiazide ) .... Once daily 3)  Atenolol  50 Mg Tabs (Atenolol ) .... Once daily 4)  Zocor  20 Mg Tabs (Simvastatin ) .... One by mouth daily 5)  Liquid Calcium /vitamin D  600-200 Mg-unit Caps (Calcium  carbonate-vitamin d ) .... 600 per day 6)  Trimethoprim  100 Mg Tabs (Trimethoprim ) .... Once daily 7)  D 1000 1000 Unit Caps (Cholecalciferol) .... One  by mouth bid 8)  Omeprazole  20 Mg Cpdr (Omeprazole ) .SABRA.. 1 by mouth 30 minutes before meals 9)  Doxycycline Hyclate 100 Mg Caps (Doxycycline hyclate) .... One by mouth bid Prescriptions: DOXYCYCLINE HYCLATE 100 MG CAPS (DOXYCYCLINE HYCLATE) one by mouth BID  #42 x 0   Entered and Authorized by:   Norleen FORBES Budge MD   Signed by:   Norleen FORBES Budge MD on 01/09/2009   Method used:   Electronically to        [facility] [phone]* (retail)       [address]       [place], [place]  [address]       Ph: [phone]       Fax: [phone]   RxID:   [id number]

## 2010-11-14 NOTE — Progress Notes (Signed)
Summary: Mail Order Refill  Phone Note Refill Request Call back at Home Phone [phone] Message from:  Patient on October 15, 2010 8:26 AM  Refills Requested: Medication #1:  LISINOPRIL-HYDROCHLOROTHIAZIDE 20-25 MG  TABS once daily  Method Requested: Electronic Initial call taken by: Trixie Dredge,  October 15, 2010 8:25 AM    Prescriptions: LISINOPRIL-HYDROCHLOROTHIAZIDE 20-25 MG  TABS (LISINOPRIL-HYDROCHLOROTHIAZIDE) once daily  #90 Tablet x 3   Entered by:   Willy Eddy, LPN   Authorized by:   Stacie Glaze MD   Signed by:   Willy Eddy, LPN on 09/81/1914   Method used:   Electronically to        [facility]* (retail)             ,          Ph: [phone]       Fax: [phone]   RxID:   [id number]

## 2010-12-23 ENCOUNTER — Other Ambulatory Visit (INDEPENDENT_AMBULATORY_CARE_PROVIDER_SITE_OTHER): Payer: Medicare Other | Admitting: Internal Medicine

## 2010-12-23 DIAGNOSIS — Z79899 Other long term (current) drug therapy: Secondary | ICD-10-CM

## 2010-12-23 DIAGNOSIS — Z0000 Encounter for general adult medical examination without abnormal findings: Secondary | ICD-10-CM

## 2010-12-23 DIAGNOSIS — I10 Essential (primary) hypertension: Secondary | ICD-10-CM

## 2010-12-23 DIAGNOSIS — E785 Hyperlipidemia, unspecified: Secondary | ICD-10-CM

## 2010-12-23 LAB — CBC WITH DIFFERENTIAL/PLATELET
Basophils Absolute: 0 10*3/uL (ref 0.0–0.1)
Basophils Relative: 0.4 % (ref 0.0–3.0)
Eosinophils Absolute: 0.1 10*3/uL (ref 0.0–0.7)
Eosinophils Relative: 1.7 % (ref 0.0–5.0)
HCT: 37.8 % (ref 36.0–46.0)
Hemoglobin: 12.7 g/dL (ref 12.0–15.0)
Lymphocytes Relative: 32.8 % (ref 12.0–46.0)
Lymphs Abs: 1.6 10*3/uL (ref 0.7–4.0)
MCHC: 33.5 g/dL (ref 30.0–36.0)
MCV: 88.7 fl (ref 78.0–100.0)
Monocytes Absolute: 0.4 10*3/uL (ref 0.1–1.0)
Monocytes Relative: 8.3 % (ref 3.0–12.0)
Neutro Abs: 2.7 10*3/uL (ref 1.4–7.7)
Neutrophils Relative %: 56.8 % (ref 43.0–77.0)
Platelets: 190 10*3/uL (ref 150.0–400.0)
RBC: 4.26 Mil/uL (ref 3.87–5.11)
RDW: 14.6 % (ref 11.5–14.6)
WBC: 4.7 10*3/uL (ref 4.5–10.5)

## 2010-12-23 LAB — BASIC METABOLIC PANEL WITH GFR
BUN: 12 mg/dL (ref 6–23)
CO2: 31 meq/L (ref 19–32)
Calcium: 8.8 mg/dL (ref 8.4–10.5)
Chloride: 101 meq/L (ref 96–112)
Creatinine, Ser: 0.8 mg/dL (ref 0.4–1.2)
GFR: 74.76 mL/min
Glucose, Bld: 94 mg/dL (ref 70–99)
Potassium: 3.6 meq/L (ref 3.5–5.1)
Sodium: 141 meq/L (ref 135–145)

## 2010-12-23 LAB — POCT URINALYSIS DIPSTICK
Glucose, UA: NEGATIVE
Leukocytes, UA: NEGATIVE
Nitrite, UA: NEGATIVE
Protein, UA: NEGATIVE
Spec Grav, UA: 1.02
Urobilinogen, UA: 0.2
pH, UA: 6.5

## 2010-12-23 LAB — LIPID PANEL
Cholesterol: 146 mg/dL (ref 0–200)
HDL: 37.2 mg/dL — ABNORMAL LOW
LDL Cholesterol: 81 mg/dL (ref 0–99)
Total CHOL/HDL Ratio: 4
Triglycerides: 141 mg/dL (ref 0.0–149.0)
VLDL: 28.2 mg/dL (ref 0.0–40.0)

## 2010-12-23 LAB — HEPATIC FUNCTION PANEL
ALT: 19 U/L (ref 0–35)
AST: 20 U/L (ref 0–37)
Albumin: 3.7 g/dL (ref 3.5–5.2)
Alkaline Phosphatase: 78 U/L (ref 39–117)
Bilirubin, Direct: 0.1 mg/dL (ref 0.0–0.3)
Total Bilirubin: 0.7 mg/dL (ref 0.3–1.2)
Total Protein: 6.7 g/dL (ref 6.0–8.3)

## 2010-12-23 LAB — TSH: TSH: 3.18 u[IU]/mL (ref 0.35–5.50)

## 2010-12-26 ENCOUNTER — Encounter: Payer: Self-pay | Admitting: Internal Medicine

## 2010-12-30 ENCOUNTER — Ambulatory Visit (INDEPENDENT_AMBULATORY_CARE_PROVIDER_SITE_OTHER): Payer: Medicare Other | Admitting: Internal Medicine

## 2010-12-30 ENCOUNTER — Encounter: Payer: Self-pay | Admitting: Internal Medicine

## 2010-12-30 VITALS — BP 124/72 | HR 60 | Temp 98.4°F | Resp 14 | Ht 66.0 in | Wt 182.0 lb

## 2010-12-30 DIAGNOSIS — N3020 Other chronic cystitis without hematuria: Secondary | ICD-10-CM

## 2010-12-30 DIAGNOSIS — Z0000 Encounter for general adult medical examination without abnormal findings: Secondary | ICD-10-CM

## 2010-12-30 DIAGNOSIS — E785 Hyperlipidemia, unspecified: Secondary | ICD-10-CM

## 2010-12-30 DIAGNOSIS — I10 Essential (primary) hypertension: Secondary | ICD-10-CM

## 2010-12-30 DIAGNOSIS — K219 Gastro-esophageal reflux disease without esophagitis: Secondary | ICD-10-CM

## 2010-12-30 NOTE — Progress Notes (Signed)
Subjective:    Patient ID: Alexandra Calderon, female    DOB: 1941-07-23, 70 y.o.   MRN: [id number]  HPI   patient is a 70 year old white female who presents for her yearly physical examination.  She also is followed for hypertension hyperlipidemia gets gastric esophageal reflux and chronic urinary tract infections.  She states that her chronic urinary tract infections have been associated by her urologist as post  Intercourse. However her husband has been ill recently and had a urinary tract infection without any sexual activity should be given Keflex to take after  Sexual activity for 2 doses every time she has intercourse but she began the antibiotic this time for a de novo  urinary tract infection. She started the Keflex on Saturday and her symptoms have resolved we discussed calling Dr. Shiela Mayer to reevaluate her urinary tract pattern of infection.  She is compliant with her hypertensive medication blood pressure stable she states that she misses her  Acid reflux medication the symptoms return immediately so therefore she is compliant with her medications    Review of Systems  Constitutional: Negative for activity change, appetite change and fatigue.  HENT: Negative for ear pain, congestion, neck pain, postnasal drip and sinus pressure.   Eyes: Negative for redness and visual disturbance.  Respiratory: Negative for cough, shortness of breath and wheezing.   Gastrointestinal: Negative for abdominal pain and abdominal distention.  Genitourinary: Negative for dysuria, frequency and menstrual problem.  Musculoskeletal: Negative for myalgias, joint swelling and arthralgias.  Skin: Negative for rash and wound.  Neurological: Negative for dizziness, weakness and headaches.  Hematological: Negative for adenopathy. Does not bruise/bleed easily.  Psychiatric/Behavioral: Negative for sleep disturbance and decreased concentration.   Past Medical History  Diagnosis Date  . Hypertension   .  Osteopenia   . PVC (premature ventricular contraction)   . Hyperlipidemia   . Arthritis   . GERD (gastroesophageal reflux disease)   . Diverticulosis   . Kidney stones   . Pneumonia   . UTI (lower urinary tract infection)   . Allergy    Past Surgical History  Procedure Date  . Kidney stones   . Dilation and curettage of uterus   . Shoulder arthroscopy rt 2009     reports that she has never smoked. She does not have any smokeless tobacco history on file. She reports that she does not drink alcohol or use illicit drugs. family history includes Cancer in her father; Diabetes in her maternal aunt and paternal grandmother; and Lymphoma in her father. Allergies  Allergen Reactions  . Levofloxacin     REACTION: rash  . Sulfamethoxazole W/Trimethoprim   . Sulfonamide Derivatives         Objective:   Physical Exam  Constitutional: She is oriented to person, place, and time. She appears well-developed and well-nourished. No distress.  HENT:  Head: Normocephalic and atraumatic.  Right Ear: External ear normal.  Left Ear: External ear normal.  Nose: Nose normal.  Mouth/Throat: Oropharynx is clear and moist.  Eyes: Conjunctivae and EOM are normal. Pupils are equal, round, and reactive to light.  Neck: Normal range of motion. Neck supple. No JVD present. No tracheal deviation present. No thyromegaly present.  Cardiovascular: Normal rate, regular rhythm, normal heart sounds and intact distal pulses.   No murmur heard. Pulmonary/Chest: Effort normal and breath sounds normal. She has no wheezes. She exhibits no tenderness.  Abdominal: Soft. Bowel sounds are normal.  Musculoskeletal: Normal range of motion. She exhibits no edema and  no tenderness.  Lymphadenopathy:    She has no cervical adenopathy.  Neurological: She is alert and oriented to person, place, and time. She has normal reflexes. No cranial nerve deficit.  Skin: Skin is warm and dry. She is not diaphoretic.  Psychiatric:  She has a normal mood and affect. Her behavior is normal.          Assessment & Plan:   This is a routine physical examination for this healthy  Female. Reviewed all health maintenance protocols including mammography colonoscopy bone density and reviewed appropriate screening labs. Her immunization history was reviewed as well as her current medications and allergies refills of her chronic medications were given and the plan for yearly health maintenance was discussed all orders and referrals were made as appropriate.

## 2010-12-30 NOTE — Assessment & Plan Note (Signed)
Stable with PPI but when she missed a dose she has heart burn symptoms

## 2010-12-30 NOTE — Assessment & Plan Note (Signed)
Stable blood pressure on medications

## 2010-12-30 NOTE — Assessment & Plan Note (Addendum)
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

## 2010-12-30 NOTE — Assessment & Plan Note (Signed)
Stable on medications She is concerned about recent news about statis and memory. She has no symptoms of memory loss.

## 2011-01-06 ENCOUNTER — Other Ambulatory Visit: Payer: Self-pay | Admitting: Internal Medicine

## 2011-04-01 ENCOUNTER — Ambulatory Visit (INDEPENDENT_AMBULATORY_CARE_PROVIDER_SITE_OTHER): Payer: Medicare Other | Admitting: Internal Medicine

## 2011-04-01 ENCOUNTER — Encounter: Payer: Self-pay | Admitting: Internal Medicine

## 2011-04-01 VITALS — BP 130/78 | HR 76 | Temp 98.2°F | Resp 16 | Ht 66.0 in | Wt 180.0 lb

## 2011-04-01 DIAGNOSIS — Z87440 Personal history of urinary (tract) infections: Secondary | ICD-10-CM

## 2011-04-01 DIAGNOSIS — R413 Other amnesia: Secondary | ICD-10-CM

## 2011-04-01 DIAGNOSIS — E785 Hyperlipidemia, unspecified: Secondary | ICD-10-CM

## 2011-04-01 DIAGNOSIS — M1990 Unspecified osteoarthritis, unspecified site: Secondary | ICD-10-CM

## 2011-04-01 DIAGNOSIS — I10 Essential (primary) hypertension: Secondary | ICD-10-CM

## 2011-04-01 MED ORDER — NITROFURANTOIN MONOHYD MACRO 100 MG PO CAPS: 100.0000 mg | ORAL_CAPSULE | Freq: Every day | ORAL | Status: AC

## 2011-04-01 NOTE — Progress Notes (Signed)
Subjective:    Patient ID: Alexandra Calderon, female    DOB: August 20, 1941, 70 y.o.   MRN: [id number]  HPI Patient is seen for chronic postcoital urinary tract infections.  hypertension is well controlled for some symptoms of memory loss she describes forgetting what she is going to order at a store and forgetting calendar dates. She denies any displacing of objects any problems traveling with directions or new problems balancing her checkbook.   Review of Systems  Constitutional: Negative for activity change, appetite change and fatigue.  HENT: Negative for ear pain, congestion, neck pain, postnasal drip and sinus pressure.   Eyes: Negative for redness and visual disturbance.  Respiratory: Negative for cough, shortness of breath and wheezing.   Gastrointestinal: Negative for abdominal pain and abdominal distention.  Genitourinary: Negative for dysuria, frequency and menstrual problem.  Musculoskeletal: Negative for myalgias, joint swelling and arthralgias.  Skin: Negative for rash and wound.  Neurological: Negative for dizziness, weakness and headaches.  Hematological: Negative for adenopathy. Does not bruise/bleed easily.  Psychiatric/Behavioral: Negative for sleep disturbance and decreased concentration.   Past Medical History  Diagnosis Date  . Hypertension   . Osteopenia   . PVC (premature ventricular contraction)   . Hyperlipidemia   . Arthritis   . GERD (gastroesophageal reflux disease)   . Diverticulosis   . Kidney stones   . Pneumonia   . UTI (lower urinary tract infection)   . Allergy    Past Surgical History  Procedure Date  . Kidney stones   . Dilation and curettage of uterus   . Shoulder arthroscopy rt 2009     reports that she has never smoked. She does not have any smokeless tobacco history on file. She reports that she does not drink alcohol or use illicit drugs. family history includes Cancer in her father; Diabetes in her maternal aunt and paternal grandmother;  and Lymphoma in her father. Allergies  Allergen Reactions  . Levofloxacin     REACTION: rash  . Sulfamethoxazole W/Trimethoprim   . Sulfonamide Derivatives        Objective:   Physical Exam  Nursing note and vitals reviewed. Constitutional: She is oriented to person, place, and time. She appears well-developed and well-nourished. No distress.  HENT:  Head: Normocephalic and atraumatic.  Right Ear: External ear normal.  Left Ear: External ear normal.  Nose: Nose normal.  Mouth/Throat: Oropharynx is clear and moist.  Eyes: Conjunctivae and EOM are normal. Pupils are equal, round, and reactive to light.  Neck: Normal range of motion. Neck supple. No JVD present. No tracheal deviation present. No thyromegaly present.  Cardiovascular: Normal rate, regular rhythm, normal heart sounds and intact distal pulses.   No murmur heard. Pulmonary/Chest: Effort normal and breath sounds normal. She has no wheezes. She exhibits no tenderness.  Abdominal: Soft. Bowel sounds are normal.  Musculoskeletal: Normal range of motion. She exhibits no edema and no tenderness.  Lymphadenopathy:    She has no cervical adenopathy.  Neurological: She is alert and oriented to person, place, and time. She has normal reflexes. No cranial nerve deficit.  Skin: Skin is warm and dry. She is not diaphoretic.  Psychiatric: She has a normal mood and affect. Her behavior is normal.      Mini-Mental Status Examination completed patient had normal score    Assessment & Plan:  Patient has been on trimethoprim for suppression of urinary tract infection. This has been successful but in the setting of intercourse the patient always has an  inflammatory reaction with inflammation of the ureter and symptoms of a urinary tract infection.  She has been given Keflex to take 4 times a day she takes it 3-4 days after intercourse and also has been problematic since they may have relations twice weekly associations of being on the  antibiotic almost chronically.  In the past she has been on suppression drugs no Macrodantin she stated it did make her feel physically strange.  She did have no rashes no shortness of breath no allergic symptoms from the medication other than the sensation of feeling strange.  Her blood pressures well controlled we tested her for memory loss she has no cognitive deficits

## 2011-04-28 ENCOUNTER — Other Ambulatory Visit: Payer: Self-pay | Admitting: Gynecology

## 2011-04-28 DIAGNOSIS — Z1231 Encounter for screening mammogram for malignant neoplasm of breast: Secondary | ICD-10-CM

## 2011-04-29 ENCOUNTER — Other Ambulatory Visit: Payer: Self-pay | Admitting: Internal Medicine

## 2011-04-29 MED ORDER — NITROFURANTOIN MONOHYD MACRO 100 MG PO CAPS: 100.0000 mg | ORAL_CAPSULE | Freq: Every day | ORAL | Status: AC

## 2011-04-29 NOTE — Telephone Encounter (Signed)
Pt req refill of nitrofurantoin, macrocrystal-monohydrate, (MACROBID) 100 MG capsule to [facility] order pharmacy.

## 2011-04-29 NOTE — Telephone Encounter (Signed)
sent 

## 2011-05-12 ENCOUNTER — Other Ambulatory Visit: Payer: Self-pay | Admitting: Gynecology

## 2011-06-02 ENCOUNTER — Ambulatory Visit
Admission: RE | Admit: 2011-06-02 | Discharge: 2011-06-02 | Disposition: A | Discharge status: Home / Self Care | Payer: Medicare Other | Source: Ambulatory Visit | Attending: Gynecology | Admitting: Gynecology

## 2011-06-02 DIAGNOSIS — Z1231 Encounter for screening mammogram for malignant neoplasm of breast: Secondary | ICD-10-CM

## 2011-06-05 ENCOUNTER — Other Ambulatory Visit: Payer: Self-pay | Admitting: Gynecology

## 2011-06-05 DIAGNOSIS — R928 Other abnormal and inconclusive findings on diagnostic imaging of breast: Secondary | ICD-10-CM

## 2011-06-11 ENCOUNTER — Ambulatory Visit
Admission: RE | Admit: 2011-06-11 | Discharge: 2011-06-11 | Disposition: A | Discharge status: Home / Self Care | Payer: Medicare Other | Source: Ambulatory Visit | Attending: Gynecology | Admitting: Gynecology

## 2011-06-11 ENCOUNTER — Other Ambulatory Visit: Payer: Self-pay | Admitting: Gynecology

## 2011-06-11 ENCOUNTER — Other Ambulatory Visit: Payer: Self-pay | Admitting: Diagnostic Radiology

## 2011-06-11 DIAGNOSIS — R928 Other abnormal and inconclusive findings on diagnostic imaging of breast: Secondary | ICD-10-CM

## 2011-06-12 ENCOUNTER — Ambulatory Visit
Admission: RE | Admit: 2011-06-12 | Discharge: 2011-06-12 | Disposition: A | Discharge status: Home / Self Care | Payer: Medicare Other | Source: Ambulatory Visit | Attending: Gynecology | Admitting: Gynecology

## 2011-06-12 ENCOUNTER — Other Ambulatory Visit: Payer: Self-pay | Admitting: Gynecology

## 2011-06-12 DIAGNOSIS — R928 Other abnormal and inconclusive findings on diagnostic imaging of breast: Secondary | ICD-10-CM

## 2011-06-12 DIAGNOSIS — C50911 Malignant neoplasm of unspecified site of right female breast: Secondary | ICD-10-CM

## 2011-06-17 ENCOUNTER — Ambulatory Visit
Admission: RE | Admit: 2011-06-17 | Discharge: 2011-06-17 | Disposition: A | Discharge status: Home / Self Care | Payer: Medicare Other | Source: Ambulatory Visit | Attending: Gynecology | Admitting: Gynecology

## 2011-06-17 DIAGNOSIS — C50911 Malignant neoplasm of unspecified site of right female breast: Secondary | ICD-10-CM

## 2011-06-17 MED ORDER — GADOBENATE DIMEGLUMINE 529 MG/ML IV SOLN
17.0000 mL | Freq: Once | INTRAVENOUS | Status: AC | PRN
  Administered 2011-06-17: 17 mL via INTRAVENOUS

## 2011-06-18 ENCOUNTER — Other Ambulatory Visit: Payer: Self-pay | Admitting: Oncology

## 2011-06-18 ENCOUNTER — Encounter (INDEPENDENT_AMBULATORY_CARE_PROVIDER_SITE_OTHER): Payer: Self-pay | Admitting: General Surgery

## 2011-06-18 ENCOUNTER — Encounter (HOSPITAL_BASED_OUTPATIENT_CLINIC_OR_DEPARTMENT_OTHER): Payer: Medicare Other | Admitting: Oncology

## 2011-06-18 ENCOUNTER — Ambulatory Visit (HOSPITAL_BASED_OUTPATIENT_CLINIC_OR_DEPARTMENT_OTHER): Payer: Self-pay | Admitting: General Surgery

## 2011-06-18 VITALS — BP 155/72 | HR 60 | Temp 98.4°F | Resp 20 | Ht 66.0 in | Wt 178.3 lb

## 2011-06-18 DIAGNOSIS — C50119 Malignant neoplasm of central portion of unspecified female breast: Secondary | ICD-10-CM

## 2011-06-18 DIAGNOSIS — C50911 Malignant neoplasm of unspecified site of right female breast: Secondary | ICD-10-CM

## 2011-06-18 DIAGNOSIS — C50919 Malignant neoplasm of unspecified site of unspecified female breast: Secondary | ICD-10-CM

## 2011-06-18 DIAGNOSIS — Z170 Estrogen receptor positive status [ER+]: Secondary | ICD-10-CM

## 2011-06-18 LAB — COMPREHENSIVE METABOLIC PANEL WITH GFR
ALT: 14 U/L (ref 0–35)
AST: 16 U/L (ref 0–37)
Albumin: 3.6 g/dL (ref 3.5–5.2)
Alkaline Phosphatase: 96 U/L (ref 39–117)
BUN: 16 mg/dL (ref 6–23)
CO2: 32 meq/L (ref 19–32)
Calcium: 9.4 mg/dL (ref 8.4–10.5)
Chloride: 103 meq/L (ref 96–112)
Creatinine, Ser: 0.73 mg/dL (ref 0.50–1.10)
Glucose, Bld: 87 mg/dL (ref 70–99)
Potassium: 3.3 meq/L — ABNORMAL LOW (ref 3.5–5.3)
Sodium: 142 meq/L (ref 135–145)
Total Bilirubin: 0.4 mg/dL (ref 0.3–1.2)
Total Protein: 7.5 g/dL (ref 6.0–8.3)

## 2011-06-18 LAB — CBC WITH DIFFERENTIAL/PLATELET
BASO%: 0.3 % (ref 0.0–2.0)
Basophils Absolute: 0 10*3/uL (ref 0.0–0.1)
EOS%: 0.7 % (ref 0.0–7.0)
Eosinophils Absolute: 0 10*3/uL (ref 0.0–0.5)
HCT: 37.6 % (ref 34.8–46.6)
HGB: 12.9 g/dL (ref 11.6–15.9)
LYMPH%: 25.8 % (ref 14.0–49.7)
MCH: 30.4 pg (ref 25.1–34.0)
MCHC: 34.4 g/dL (ref 31.5–36.0)
MCV: 88.4 fL (ref 79.5–101.0)
MONO#: 0.4 10*3/uL (ref 0.1–0.9)
MONO%: 7.3 % (ref 0.0–14.0)
NEUT#: 3.7 10*3/uL (ref 1.5–6.5)
NEUT%: 65.9 % (ref 38.4–76.8)
Platelets: 202 10*3/uL (ref 145–400)
RBC: 4.25 10*6/uL (ref 3.70–5.45)
RDW: 13.5 % (ref 11.2–14.5)
WBC: 5.6 10*3/uL (ref 3.9–10.3)
lymph#: 1.5 10*3/uL (ref 0.9–3.3)

## 2011-06-18 LAB — CANCER ANTIGEN 27.29: CA 27.29: 17 U/mL (ref 0–39)

## 2011-06-18 NOTE — Progress Notes (Signed)
Chief Complaint  Patient presents with  . Breast Cancer    HPI Alexandra Calderon is a 70 y.o. female.   HPI This patient was seen in the multidisciplinary breast clinic for violation of a newly diagnosed invasive ductal carcinoma of the right breast. This was found on her annual screening mammogram as a spiculated mass. She underwent ultrasound-guided biopsy which demonstrated invasive ductal carcinoma. She states that she does her mammograms annually and in fact, was called back last year for reevaluation and extra images but this was thought to be "overlapping tissue". She states that she has not been doing her self breast exams but recently had a clinical breast exam by her primary physician and no suspicious lesions were identified. She denies any systemic symptoms such as weight loss, bony pains, or headaches. She does state that she has been having some right-sided breast pain. She is not currently using any hormones and has less than one year of hormone use. She does not have any significant family history for breast cancer although her father died at age 54 of Hodgkin's and her mother died at age 63 of colon cancer. She states that otherwise she is in good health and was seen by Dr. Logan Bores, her cardiologist, review of this year and everything was felt to be "okay". She had a recent MRI which demonstrated a 1.9 cm x 1.8 cm area in the upper inner quadrant of the right breast suspicious for malignancy in the area of her biopsy. Past Medical History  Diagnosis Date  . Hypertension   . Osteopenia   . PVC (premature ventricular contraction)   . Hyperlipidemia   . Arthritis   . GERD (gastroesophageal reflux disease)   . Diverticulosis   . Kidney stones   . Pneumonia   . UTI (lower urinary tract infection)   . Allergy     Past Surgical History  Procedure Date  . Kidney stones   . Dilation and curettage of uterus   . Shoulder arthroscopy rt 2009   . Shoulder surgery     Family History    Problem Relation Age of Onset  . Lymphoma Father   . Cancer Father   . Diabetes Maternal Aunt   . Diabetes Paternal Grandmother     Social History History  Substance Use Topics  . Smoking status: Never Smoker   . Smokeless tobacco: Not on file  . Alcohol Use: No    Allergies  Allergen Reactions  . Levofloxacin     REACTION: rash  . Sulfamethoxazole W/Trimethoprim   . Sulfonamide Derivatives     Current Outpatient Prescriptions  Medication Sig Dispense Refill  . cholecalciferol (VITAMIN D) 1000 UNITS tablet Take 1,000 Units by mouth 2 (two) times daily.        . nitrofurantoin, macrocrystal-monohydrate, (MACROBID) 100 MG capsule Take 100 mg by mouth 1 day or 1 dose.        Marland Kitchen aspirin 81 MG tablet Take 81 mg by mouth daily.        Marland Kitchen atenolol (TENORMIN) 50 MG tablet Take 50 mg by mouth daily.        . calcium carbonate (OS-CAL) 600 MG TABS Take 600 mg by mouth daily.        . cephALEXin (KEFLEX) 500 MG capsule Take 500 mg by mouth. 1 after sex        . lisinopril-hydrochlorothiazide (PRINZIDE,ZESTORETIC) 20-25 MG per tablet Take 1 tablet by mouth daily.        Marland Kitchen  magnesium chloride (SLOW-MAG) 64 MG TBEC Take by mouth 2 (two) times daily.       Marland Kitchen omeprazole (PRILOSEC) 20 MG capsule Take 20 mg by mouth daily.        . simvastatin (ZOCOR) 20 MG tablet TAKE 1 TABLET DAILY  90 tablet  2    Review of Systems Review of Systems  All other systems reviewed and are negative.  15 point review of systems is positive for glasses, ringing in her ears, sinus problems, gum disease, palpitations, heartburn, frequent kidney infections and urinary infections, dribbling urine, arthritis, and hot flashes otherwise the 15 point review of systems is negative  Blood pressure 155/72, pulse 60, temperature 98.4 F (36.9 C), resp. rate 20, height 5\' 6"  (1.676 m), weight 178 lb 4.8 oz (80.876 kg).  Physical Exam Physical Exam  Constitutional: She is oriented to person, place, and time. She appears  well-developed and well-nourished. No distress.  HENT:  Head: Normocephalic and atraumatic.  Eyes: Conjunctivae are normal. Pupils are equal, round, and reactive to light. Right eye exhibits no discharge. Left eye exhibits no discharge. No scleral icterus.  Neck: Normal range of motion. Neck supple. No tracheal deviation present.  Cardiovascular: Normal rate, regular rhythm and normal heart sounds.   Pulmonary/Chest: Effort normal and breath sounds normal. Stridor present. No respiratory distress. She has no wheezes.       Left breast is normal without any dominant masses, skin changes, lymphadenopathy  Right breast has some postbiopsy changes in the upper outer quadrant of the breast and near the biopsy site with some bruising and a small amount in the area of the biopsy site with some associated fibrous tissue in the area. No other skin changes, nipple inversion, or axillary or supraclavicular lymphadenopathy.  Abdominal: Soft. Bowel sounds are normal. She exhibits no distension and no mass. There is no tenderness. There is no rebound and no guarding.  Musculoskeletal: Normal range of motion.  Neurological: She is alert and oriented to person, place, and time.  Skin: Skin is warm and dry. No rash noted. She is not diaphoretic. No erythema. No pallor.  Psychiatric: She has a normal mood and affect. Her behavior is normal. Judgment and thought content normal.    Data Reviewed Her mammogram and MRI were reviewed her laboratory studies are pending although she has a CBC from today which was unremarkable.  Assessment/Plan    Invasive ductal carcinoma of the right breast.  A long discussion with the patient and her family regarding her new diagnosis. We discussed the surgical options including breast conservation therapy versus mastectomy with the pros and cons of each and the risks and benefits of each option. I think she would be a fine candidate for either approach. She is leaning towards  breast conservation therapy and we discussed the procedure of needle localized lumpectomy with sentinel lymph node biopsy. I discussed with her the risks of infection, bleeding, pain, scarring, recurrence, need for future surgery and reexcision, poor cosmesis, need for radiation, nerve injury, and lymphedema, false positive and false negative. She expressed understanding and desires to proceed with needle localized lumpectomy with sentinel lymph node biopsy. We will schedule this as soon as available. I spent 45 minutes with the patient and her family discussing her treatment options.               Lodema Pilot DAVID 06/18/2011, 1:06 PM

## 2011-06-19 ENCOUNTER — Other Ambulatory Visit (INDEPENDENT_AMBULATORY_CARE_PROVIDER_SITE_OTHER): Payer: Self-pay | Admitting: General Surgery

## 2011-06-19 ENCOUNTER — Other Ambulatory Visit: Payer: Self-pay | Admitting: Internal Medicine

## 2011-06-19 DIAGNOSIS — C50911 Malignant neoplasm of unspecified site of right female breast: Secondary | ICD-10-CM

## 2011-06-19 DIAGNOSIS — C50919 Malignant neoplasm of unspecified site of unspecified female breast: Secondary | ICD-10-CM

## 2011-06-25 ENCOUNTER — Other Ambulatory Visit (INDEPENDENT_AMBULATORY_CARE_PROVIDER_SITE_OTHER): Payer: Self-pay | Admitting: General Surgery

## 2011-06-25 ENCOUNTER — Other Ambulatory Visit (INDEPENDENT_AMBULATORY_CARE_PROVIDER_SITE_OTHER): Payer: Medicare Other

## 2011-06-25 ENCOUNTER — Ambulatory Visit (HOSPITAL_COMMUNITY)
Admission: RE | Admit: 2011-06-25 | Discharge: 2011-06-25 | Disposition: A | Discharge status: Home / Self Care | Payer: Medicare Other | Source: Ambulatory Visit | Attending: General Surgery | Admitting: General Surgery

## 2011-06-25 ENCOUNTER — Encounter (HOSPITAL_COMMUNITY)
Admission: RE | Admit: 2011-06-25 | Discharge: 2011-06-25 | Disposition: A | Discharge status: Home / Self Care | Payer: Medicare Other | Source: Ambulatory Visit | Attending: General Surgery | Admitting: General Surgery

## 2011-06-25 DIAGNOSIS — E785 Hyperlipidemia, unspecified: Secondary | ICD-10-CM

## 2011-06-25 DIAGNOSIS — Z01812 Encounter for preprocedural laboratory examination: Secondary | ICD-10-CM | POA: Insufficient documentation

## 2011-06-25 DIAGNOSIS — C50919 Malignant neoplasm of unspecified site of unspecified female breast: Secondary | ICD-10-CM

## 2011-06-25 DIAGNOSIS — Z01810 Encounter for preprocedural cardiovascular examination: Secondary | ICD-10-CM | POA: Insufficient documentation

## 2011-06-25 DIAGNOSIS — Z01811 Encounter for preprocedural respiratory examination: Secondary | ICD-10-CM | POA: Insufficient documentation

## 2011-06-25 LAB — SURGICAL PCR SCREEN
MRSA, PCR: NEGATIVE
Staphylococcus aureus: NEGATIVE

## 2011-06-25 LAB — CBC
HCT: 38.5 % (ref 36.0–46.0)
Hemoglobin: 13.2 g/dL (ref 12.0–15.0)
MCH: 29.6 pg (ref 26.0–34.0)
MCHC: 34.3 g/dL (ref 30.0–36.0)
MCV: 86.3 fL (ref 78.0–100.0)
Platelets: 223 10*3/uL (ref 150–400)
RBC: 4.46 MIL/uL (ref 3.87–5.11)
RDW: 13.3 % (ref 11.5–15.5)
WBC: 6.9 10*3/uL (ref 4.0–10.5)

## 2011-06-25 LAB — URINALYSIS, ROUTINE W REFLEX MICROSCOPIC
Bilirubin Urine: NEGATIVE
Glucose, UA: NEGATIVE mg/dL
Hgb urine dipstick: NEGATIVE
Ketones, ur: NEGATIVE mg/dL
Leukocytes, UA: NEGATIVE
Nitrite: NEGATIVE
Protein, ur: NEGATIVE mg/dL
Specific Gravity, Urine: 1.021 (ref 1.005–1.030)
Urobilinogen, UA: 0.2 mg/dL (ref 0.0–1.0)
pH: 6 (ref 5.0–8.0)

## 2011-06-25 LAB — BASIC METABOLIC PANEL WITH GFR
BUN: 13 mg/dL (ref 6–23)
CO2: 36 meq/L — ABNORMAL HIGH (ref 19–32)
Calcium: 9.7 mg/dL (ref 8.4–10.5)
Chloride: 102 meq/L (ref 96–112)
Creatinine, Ser: 0.79 mg/dL (ref 0.50–1.10)
GFR calc Af Amer: >60 mL/min
GFR calc non Af Amer: >60 mL/min
Glucose, Bld: 105 mg/dL — ABNORMAL HIGH (ref 70–99)
Potassium: 3.2 meq/L — ABNORMAL LOW (ref 3.5–5.1)
Sodium: 145 meq/L (ref 135–145)

## 2011-06-26 LAB — LIPID PANEL
Cholesterol: 150 mg/dL (ref 0–200)
HDL: 46.2 mg/dL
LDL Cholesterol: 84 mg/dL (ref 0–99)
Total CHOL/HDL Ratio: 3
Triglycerides: 99 mg/dL (ref 0.0–149.0)
VLDL: 19.8 mg/dL (ref 0.0–40.0)

## 2011-07-01 ENCOUNTER — Ambulatory Visit
Admission: RE | Admit: 2011-07-01 | Discharge: 2011-07-01 | Disposition: A | Discharge status: Home / Self Care | Payer: Medicare Other | Source: Ambulatory Visit | Attending: General Surgery | Admitting: General Surgery

## 2011-07-01 ENCOUNTER — Ambulatory Visit
Admit: 2011-07-01 | Discharge: 2011-07-01 | Disposition: A | Discharge status: Home / Self Care | Payer: Medicare Other | Attending: General Surgery | Admitting: General Surgery

## 2011-07-01 ENCOUNTER — Ambulatory Visit (HOSPITAL_COMMUNITY)
Admission: RE | Admit: 2011-07-01 | Discharge: 2011-07-01 | Disposition: A | Discharge status: Home / Self Care | Payer: Medicare Other | Source: Ambulatory Visit | Attending: General Surgery | Admitting: General Surgery

## 2011-07-01 ENCOUNTER — Other Ambulatory Visit (INDEPENDENT_AMBULATORY_CARE_PROVIDER_SITE_OTHER): Payer: Self-pay | Admitting: General Surgery

## 2011-07-01 DIAGNOSIS — Z01818 Encounter for other preprocedural examination: Secondary | ICD-10-CM | POA: Insufficient documentation

## 2011-07-01 DIAGNOSIS — C50911 Malignant neoplasm of unspecified site of right female breast: Secondary | ICD-10-CM

## 2011-07-01 DIAGNOSIS — C773 Secondary and unspecified malignant neoplasm of axilla and upper limb lymph nodes: Secondary | ICD-10-CM | POA: Insufficient documentation

## 2011-07-01 DIAGNOSIS — C50919 Malignant neoplasm of unspecified site of unspecified female breast: Secondary | ICD-10-CM | POA: Insufficient documentation

## 2011-07-01 DIAGNOSIS — K219 Gastro-esophageal reflux disease without esophagitis: Secondary | ICD-10-CM | POA: Insufficient documentation

## 2011-07-01 DIAGNOSIS — Z01812 Encounter for preprocedural laboratory examination: Secondary | ICD-10-CM | POA: Insufficient documentation

## 2011-07-01 DIAGNOSIS — D0590 Unspecified type of carcinoma in situ of unspecified breast: Secondary | ICD-10-CM | POA: Insufficient documentation

## 2011-07-01 DIAGNOSIS — I10 Essential (primary) hypertension: Secondary | ICD-10-CM | POA: Insufficient documentation

## 2011-07-01 DIAGNOSIS — Z01810 Encounter for preprocedural cardiovascular examination: Secondary | ICD-10-CM | POA: Insufficient documentation

## 2011-07-01 HISTORY — PX: BREAST LUMPECTOMY: SHX2

## 2011-07-01 MED ORDER — TECHNETIUM TC 99M SULFUR COLLOID FILTERED
1.0000 | Freq: Once | INTRAVENOUS | Status: AC | PRN
  Administered 2011-07-01: 1 via INTRADERMAL

## 2011-07-02 ENCOUNTER — Other Ambulatory Visit: Payer: Self-pay | Admitting: Internal Medicine

## 2011-07-02 ENCOUNTER — Ambulatory Visit: Payer: Medicare Other | Admitting: Internal Medicine

## 2011-07-04 NOTE — Op Note (Signed)
Alexandra Calderon, FEUERSTEIN NO.:  [id number]  MEDICAL RECORD NO.:  [id number]  LOCATION:  [facility]                         FACILITY:  [facility]  PHYSICIAN:  Lodema Pilot, MD       DATE OF BIRTH:  04-Mar-1941  DATE OF PROCEDURE:  07/01/2011 DATE OF DISCHARGE:  07/01/2011                              OPERATIVE REPORT   PROCEDURE:  Right breast needle localized partial mastectomy with right sentinel lymph node biopsy.  PREOPERATIVE DIAGNOSIS:  Right breast cancer.  POSTOPERATIVE DIAGNOSIS:  Right breast cancer.  SURGEON:  Lodema Pilot, MD.  ASSISTANT:  None.  ANESTHESIA:  General LMA anesthesia with 30 mL of 1% lidocaine with epinephrine and 0.25% Marcaine injected in a 50:50 mixture.  FLUIDS:  1500 mL of crystalloid.  ESTIMATED BLOOD LOSS:  25 mL.  DRAINS:  None.  SPECIMEN: 1. Sentinel lymph node #1 sent to pathology for permanent sectioning     with level one lymph node which was hot and blue with a gamma count     of 715. 2. Sentinel lymph node #2 which was not hot and not blue, but palpable     and no significant radioactive activity on that. 3. Right breast needle-localized lumpectomy with a short stitch     marking on the superior margin and the long stitch marking on the     lateral margin. 4. Additional anterior superior margin with a short stitch marking in     the new anterior superior margin and a long stitch marking in the     new anterior deep margin.  COMPLICATIONS:  None apparent.  FINDINGS:  Mass and clip retrieved as confirmed by mammography and also verbal consultation from Dr. Deboraha Sprang, 2 sentinel lymph nodes retrieved and clips placed in the lumpectomy bed.  INDICATIONS FOR PROCEDURE:  Ms. Mcsorley is a 70 year old female with a recently diagnosed right breast cancer, found on the spiculated mass on routine annual mammography.  She had core biopsy confirming invasive ductal carcinoma and after discussion she chose breast  conservation therapy.  OPERATIVE DETAILS:  Ms. Brunkow was seen and evaluated in the preoperative area and risks and benefits of procedure were again discussed in lay terms.  Informed consent was obtained.  Surgical site was marked prior to anesthesia and she had already had needle localization of the mass. Prophylactic antibiotics were given.  She was taken to the operating room, placed on table in supine position and general LMA anesthesia was obtained.  Her right arm and the chest and neck were prepped and draped in a standard surgical fashion, and using the gamma probe, prior to prepping and draping I injected her with 5 mL of methylene blue in the periareolar fashion, passed injected as a few mL around the tumor which was in the retroareolar location, 5 minutes of vigorous breast massage was performed and then her chest was prepped and draped.  The gamma probe was used to localize the area of greatest activity in the right axilla and a transverse incision was made just inferior to the hair bearing area and axilla.  Dissection carried down to the subcutaneous tissue using Bovie electrocautery and the clavipectoral fascia was  entered.  Axillary fat was identified, and immediately upon entering the axillary contents, noticed two obvious blue lymphatic channels coursing towards a palpable but not pathologically enlarged lymph node.  This was also hot on the gamma probe and the node was elevated and dissected circumferentially using multiple hemoclips and Bovie electrocautery and the lymphovascular structure was clipped and divided.  The node was removed and we are unsure if this was too clustered nodes or one single node with the gamma activity was 715 on this lymph node and this was sent to pathology for permanent sectioning, labeled sentinel lymph node #1, which was both hot and blue level one lymph node.  Right immediately under that, there was a visible lymph node, but again this was  not pathologically enlarged.  There was no blue lymphatics coursing to the node and it was not hot to gamma probe, essentially it was visible right there adjacent to the sentinel lymph node.  I dissected this in a similar fashion and sent this to pathology as sentinel lymph node #2, which was not hot and not blue and had no significant radioactive activity.  Again, a needle probe was placed into the axilla and there was no significant residual activity in the axilla with the pacing count of 2.  I palpated the wound and there was no other palpable lymph nodes. There were no other blue lymph nodes as well.  The axillary cavity was irrigated with sterile saline solution and the hemostasis was obtained with Bovie electrocautery.  The clavipectoral fascia was then approximated with a running 3-0 Vicryl suture and the skin edges were approximated with 4-0 Monocryl subcuticular suture.  Then, attention was then turned to the lumpectomy specimen.  The mass and the clip appeared to be deep to the nipple areolar complex and just cephalad to this I made a circumareolar incision which was still little away from the needle, so I felt like easily retrieve the needle and the surrounding tissue through this incision.  Subcutaneous flaps were created circumferentially using Bovie electrocautery and the tissue around the needle was dissected circumferentially down to the pectoralis muscle. The needle was cut and pulled into the wound and I was able to manipulate the lumpectomy specimen.  After I dissected circumferentially around the needle down to the chest wall, the specimen was delivered from the incision and marked with a short silk stitch on the superior margin and long silk stitch on the lateral margin.  Mammography was used in the room to evaluate for adequate specimen.  The clip was located in the center of the specimen as well as the spiculated mass and appeared to be healthy appearing tissue  around the lesion and the clip.  During the dissection, I did not dissect into the guidewire and the specimen was removed.  The patient had specifically stated that she wanted extra wide margins around this tumor and she stated that she did not want any repeat excisions if her margins are inadequate that she would proceed to mastectomy if the margins were close or positive, and she instructed made to take generous margin regardless of the cosmetics.  Reviewing the images of the needle localization wire and the placement of the clip and the mass, I decided to take an additional superior anterior margin as if any of the margins were to be close or positive, I felt that this could be the one I concern.  Another regular tissue was taken in the superficial and inferior margin and this was  sent to pathology, labeled additional new anterior superior margin with a short silk stitch marking and a long stitch marking in the new anterior deep margin.  The specimen was sent to pathology for permanent sectioning.  The wound was then irrigated with sterile saline solution and the wound was noted be hemostatic.  The deep margin was the pectoralis fascia, so if the deep margin is positive, I am not sure that I can take any more additional tissue in this area.  Again, the wound was noted be hemostatic and multiple hemoclips were placed, marking the pectoralis fascia as well as the periphery of the lumpectomy cavity and the dermis was approximated with interrupted 3-0 Vicryl sutures.  Prior to securing the final two dermal sutures, I placed 18-gauge angiocatheter into the lumpectomy cavity and injected the cavity with a total of 30 mL of 1% lidocaine with epinephrine and 0.25% Marcaine solution and a 50:50 mixture.  The sutures were secured for watertight closure and then the skin edges were approximated with a 4-0 Monocryl subcuticular suture.  Skin was washed and dried, and benzoin and Steri-Strips were  applied, and sterile dressings were applied at all incisions.  The patient tolerated the procedure well. There were no apparent complications.          ______________________________ Lodema Pilot, MD     BL/MEDQ  D:  07/01/2011  T:  07/01/2011  Job:  [id number]  Electronically Signed by Lodema Pilot DO on 07/04/2011 09:53:13 AM

## 2011-07-07 ENCOUNTER — Telehealth (INDEPENDENT_AMBULATORY_CARE_PROVIDER_SITE_OTHER): Payer: Self-pay

## 2011-07-07 NOTE — Telephone Encounter (Signed)
Ms. Rance would like her pathology results, the best numbers to call patient [phone] (cell) or [phone] (home).

## 2011-07-17 ENCOUNTER — Encounter (INDEPENDENT_AMBULATORY_CARE_PROVIDER_SITE_OTHER): Payer: Self-pay | Admitting: General Surgery

## 2011-07-17 ENCOUNTER — Ambulatory Visit (INDEPENDENT_AMBULATORY_CARE_PROVIDER_SITE_OTHER): Payer: Medicare Other | Admitting: General Surgery

## 2011-07-17 VITALS — BP 138/66 | HR 80 | Temp 97.4°F | Resp 16 | Ht 66.0 in | Wt 176.8 lb

## 2011-07-17 DIAGNOSIS — C50919 Malignant neoplasm of unspecified site of unspecified female breast: Secondary | ICD-10-CM

## 2011-07-17 NOTE — Progress Notes (Signed)
Subjective:     Patient ID: Alexandra Calderon, female   DOB: [birth year], 70 y.o.   MRN: [id number]  HPI This patient follows up today status post right breast lumpectomy with sentinel node biopsy. She is doing well and pain controlled off pain medications. She has no arm swelling or no complaints. Her pathology was consistent with a T2 N85mi breast cancer.  Review of Systems     Objective:   Physical Exam No acute distress and nontoxic-appearing  Her incisions are healing well without sign of infection. She has a good overall cosmesis without evidence of dimpling or skin changes. No evidence of lymphedema.    Assessment:     Right breast cancer, T2 N58mi-doing well from her surgery.    Plan:     We had a long discussion regarding her pathology results and further treatments. I think that she would ultimately require chemotherapy given her tumor and her nodal disease. I discussed with her the ACOSOG Z11 trial and we discussed the risks and benefits of completion axillary node dissection. She has microinvasive nodal involvement and with a single lymph node positive no gross disease, I would recommend that we not do a completion axillary dissection for her. I do not think she will receive any survival benefit from this. I did explain that the traditional surgical recommendation was for completion axillary dissection, but again there is no survival benefit from this. Her margins were negative and I do not think that any further surgical treatment will be necessary except for Mediport placement. I will contact Dr. Donnie Coffin to verify if the port will be required and I did discuss with her the risks of the procedure including infection, bleeding, pain, scarring, pneumothorax, hemothorax, port malfunction, need for chest tube, arrhythmia and death and she expressed understanding and desires to proceed with Mediport placement if required by her oncologist. We will schedule this when convenient for her. I also  recommended radiation treatment and she is already under the care of Dr. Dayton Scrape for this. We will set her up for port placement and otherwise I will see her back in 3 months for followup evaluation.

## 2011-08-06 ENCOUNTER — Encounter (HOSPITAL_BASED_OUTPATIENT_CLINIC_OR_DEPARTMENT_OTHER): Payer: Medicare Other | Admitting: Oncology

## 2011-08-06 ENCOUNTER — Other Ambulatory Visit: Payer: Self-pay | Admitting: Oncology

## 2011-08-06 DIAGNOSIS — C50119 Malignant neoplasm of central portion of unspecified female breast: Secondary | ICD-10-CM

## 2011-08-06 LAB — CBC WITH DIFFERENTIAL/PLATELET
BASO%: 0.2 % (ref 0.0–2.0)
Basophils Absolute: 0 10*3/uL (ref 0.0–0.1)
EOS%: 3.2 % (ref 0.0–7.0)
Eosinophils Absolute: 0.2 10*3/uL (ref 0.0–0.5)
HCT: 37.4 % (ref 34.8–46.6)
HGB: 12.8 g/dL (ref 11.6–15.9)
LYMPH%: 31.3 % (ref 14.0–49.7)
MCH: 30 pg (ref 25.1–34.0)
MCHC: 34.2 g/dL (ref 31.5–36.0)
MCV: 87.8 fL (ref 79.5–101.0)
MONO#: 0.5 10*3/uL (ref 0.1–0.9)
MONO%: 8.5 % (ref 0.0–14.0)
NEUT#: 3.6 10*3/uL (ref 1.5–6.5)
NEUT%: 56.8 % (ref 38.4–76.8)
Platelets: 197 10*3/uL (ref 145–400)
RBC: 4.26 10*6/uL (ref 3.70–5.45)
RDW: 13.9 % (ref 11.2–14.5)
WBC: 6.3 10*3/uL (ref 3.9–10.3)
lymph#: 2 10*3/uL (ref 0.9–3.3)

## 2011-08-06 LAB — COMPREHENSIVE METABOLIC PANEL WITH GFR
ALT: 21 U/L (ref 0–35)
AST: 19 U/L (ref 0–37)
Albumin: 3.5 g/dL (ref 3.5–5.2)
Alkaline Phosphatase: 101 U/L (ref 39–117)
BUN: 14 mg/dL (ref 6–23)
CO2: 30 meq/L (ref 19–32)
Calcium: 9.2 mg/dL (ref 8.4–10.5)
Chloride: 101 meq/L (ref 96–112)
Creatinine, Ser: 0.93 mg/dL (ref 0.50–1.10)
Glucose, Bld: 111 mg/dL — ABNORMAL HIGH (ref 70–99)
Potassium: 3.2 meq/L — ABNORMAL LOW (ref 3.5–5.3)
Sodium: 141 meq/L (ref 135–145)
Total Bilirubin: 0.2 mg/dL — ABNORMAL LOW (ref 0.3–1.2)
Total Protein: 7 g/dL (ref 6.0–8.3)

## 2011-08-12 ENCOUNTER — Ambulatory Visit
Admission: RE | Admit: 2011-08-12 | Discharge: 2011-08-12 | Disposition: A | Discharge status: Home / Self Care | Payer: Medicare Other | Source: Ambulatory Visit | Attending: Radiation Oncology | Admitting: Radiation Oncology

## 2011-08-12 DIAGNOSIS — Z510 Encounter for antineoplastic radiation therapy: Secondary | ICD-10-CM | POA: Insufficient documentation

## 2011-08-12 DIAGNOSIS — C50919 Malignant neoplasm of unspecified site of unspecified female breast: Secondary | ICD-10-CM | POA: Insufficient documentation

## 2011-08-17 ENCOUNTER — Telehealth: Payer: Self-pay | Admitting: Oncology

## 2011-08-18 ENCOUNTER — Ambulatory Visit (INDEPENDENT_AMBULATORY_CARE_PROVIDER_SITE_OTHER): Payer: Medicare Other | Admitting: Internal Medicine

## 2011-08-18 ENCOUNTER — Encounter: Payer: Self-pay | Admitting: Internal Medicine

## 2011-08-18 VITALS — BP 130/78 | HR 76 | Temp 98.2°F | Resp 16 | Ht 66.0 in | Wt 176.0 lb

## 2011-08-18 DIAGNOSIS — E785 Hyperlipidemia, unspecified: Secondary | ICD-10-CM

## 2011-08-18 DIAGNOSIS — C50211 Malignant neoplasm of upper-inner quadrant of right female breast: Secondary | ICD-10-CM | POA: Insufficient documentation

## 2011-08-18 DIAGNOSIS — I10 Essential (primary) hypertension: Secondary | ICD-10-CM

## 2011-08-18 DIAGNOSIS — T887XXA Unspecified adverse effect of drug or medicament, initial encounter: Secondary | ICD-10-CM

## 2011-08-18 DIAGNOSIS — K219 Gastro-esophageal reflux disease without esophagitis: Secondary | ICD-10-CM

## 2011-08-18 DIAGNOSIS — C50919 Malignant neoplasm of unspecified site of unspecified female breast: Secondary | ICD-10-CM

## 2011-08-18 DIAGNOSIS — F411 Generalized anxiety disorder: Secondary | ICD-10-CM

## 2011-08-18 DIAGNOSIS — F419 Anxiety disorder, unspecified: Secondary | ICD-10-CM

## 2011-08-18 MED ORDER — LOSARTAN POTASSIUM-HCTZ 100-25 MG PO TABS: 1.0000 | ORAL_TABLET | Freq: Every day | ORAL | Status: DC

## 2011-08-18 MED ORDER — ALPRAZOLAM 0.25 MG PO TABS: 0.2500 mg | ORAL_TABLET | Freq: Three times a day (TID) | ORAL | Status: AC | PRN

## 2011-08-18 NOTE — Assessment & Plan Note (Signed)
The patient has  Alexandra Calderon radiation and will use Arimidex after the radiation

## 2011-08-18 NOTE — Progress Notes (Signed)
Subjective:    Patient ID: Alexandra Calderon, female    DOB: Feb 20, 1941, 70 y.o.   MRN: [id number]  HPI Follow up of HTN discussion of anxiety and elevation of blood pressure Will need flu shot Discussion for anxiety Has cough from ACE   Review of Systems  Constitutional: Negative for activity change, appetite change and fatigue.  HENT: Negative for ear pain, congestion, neck pain, postnasal drip and sinus pressure.   Eyes: Negative for redness and visual disturbance.  Respiratory: Negative for cough, shortness of breath and wheezing.   Gastrointestinal: Negative for abdominal pain and abdominal distention.  Genitourinary: Negative for dysuria, frequency and menstrual problem.  Musculoskeletal: Negative for myalgias, joint swelling and arthralgias.  Skin: Negative for rash and wound.  Neurological: Negative for dizziness, weakness and headaches.  Hematological: Negative for adenopathy. Does not bruise/bleed easily.  Psychiatric/Behavioral: Negative for sleep disturbance and decreased concentration.   Past Medical History  Diagnosis Date  . Hypertension   . Osteopenia   . PVC (premature ventricular contraction)   . Hyperlipidemia   . Arthritis   . GERD (gastroesophageal reflux disease)   . Diverticulosis   . Kidney stones   . Pneumonia   . UTI (lower urinary tract infection)   . Allergy   . Cancer   . Hearing loss   . Palpitations    Past Surgical History  Procedure Date  . Kidney stones   . Dilation and curettage of uterus   . Shoulder arthroscopy rt 2009   . Shoulder surgery   . Breast lumpectomy 07/01/11    w/sentinal node bx - right side    reports that she has never smoked. She does not have any smokeless tobacco history on file. She reports that she does not drink alcohol or use illicit drugs. family history includes Cancer in her father and mother; Diabetes in her maternal aunt and paternal grandmother; Hodgkin's lymphoma in her father; and Lymphoma in her  father. Allergies  Allergen Reactions  . Levofloxacin     REACTION: rash  . Sulfamethoxazole W/Trimethoprim Rash and Other (See Comments)    Chills, fever  . Sulfonamide Derivatives Rash and Other (See Comments)    Chills, fever        Objective:   Physical Exam  Nursing note and vitals reviewed. Constitutional: She is oriented to person, place, and time. She appears well-developed and well-nourished. No distress.  HENT:  Head: Normocephalic and atraumatic.  Right Ear: External ear normal.  Left Ear: External ear normal.  Nose: Nose normal.  Mouth/Throat: Oropharynx is clear and moist.  Eyes: Conjunctivae and EOM are normal. Pupils are equal, round, and reactive to light.  Neck: Normal range of motion. Neck supple. No JVD present. No tracheal deviation present. No thyromegaly present.  Cardiovascular: Normal rate, regular rhythm, normal heart sounds and intact distal pulses.   No murmur heard. Pulmonary/Chest: Effort normal and breath sounds normal. She has no wheezes. She exhibits no tenderness.  Abdominal: Soft. Bowel sounds are normal.  Musculoskeletal: Normal range of motion. She exhibits no edema and no tenderness.  Lymphadenopathy:    She has no cervical adenopathy.  Neurological: She is alert and oriented to person, place, and time. She has normal reflexes. No cranial nerve deficit.  Skin: Skin is warm and dry. She is not diaphoretic.  Psychiatric: She has a normal mood and affect. Her behavior is normal.          Assessment & Plan:  The pt has a cough  form the ACE and we will change to cozaar. The pt has begun xrt The pt has mild anxiety and we have given her xanax  I have spent more than 30 minutes examining this patient face-to-face of which over half was spent in counseling

## 2011-08-18 NOTE — Patient Instructions (Signed)
The patient is instructed to continue all medications as prescribed. Schedule followup with check out clerk upon leaving the clinic  

## 2011-08-20 ENCOUNTER — Ambulatory Visit
Admission: RE | Admit: 2011-08-20 | Discharge: 2011-08-20 | Disposition: A | Discharge status: Home / Self Care | Payer: Medicare Other | Source: Ambulatory Visit | Attending: Radiation Oncology | Admitting: Radiation Oncology

## 2011-08-20 NOTE — Procedures (Signed)
Ms. Neault underwent simulation verification for treatment to her right breast.  Her isocenter was in good position, and the multileaf collimators contoured the treatment volume appropriately.    ______________________________ Maryln Gottron, M.D. RJM/MEDQ  D:  08/20/2011  T:  08/20/2011  Job:  [id number]

## 2011-08-20 NOTE — Telephone Encounter (Signed)
err

## 2011-08-21 ENCOUNTER — Ambulatory Visit
Admission: RE | Admit: 2011-08-21 | Discharge: 2011-08-21 | Disposition: A | Discharge status: Home / Self Care | Payer: Medicare Other | Source: Ambulatory Visit | Attending: Radiation Oncology | Admitting: Radiation Oncology

## 2011-08-22 ENCOUNTER — Ambulatory Visit
Admission: RE | Admit: 2011-08-22 | Discharge: 2011-08-22 | Disposition: A | Discharge status: Home / Self Care | Payer: Medicare Other | Source: Ambulatory Visit | Attending: Radiation Oncology | Admitting: Radiation Oncology

## 2011-08-22 DIAGNOSIS — C50919 Malignant neoplasm of unspecified site of unspecified female breast: Secondary | ICD-10-CM

## 2011-08-22 MED ORDER — RADIAPLEXRX EX GEL
Freq: Once | CUTANEOUS | Status: AC
  Administered 2011-08-22: 09:00:00 via TOPICAL

## 2011-08-25 ENCOUNTER — Ambulatory Visit
Admission: RE | Admit: 2011-08-25 | Discharge: 2011-08-25 | Disposition: A | Discharge status: Home / Self Care | Payer: Medicare Other | Source: Ambulatory Visit | Attending: Radiation Oncology | Admitting: Radiation Oncology

## 2011-08-25 DIAGNOSIS — C50919 Malignant neoplasm of unspecified site of unspecified female breast: Secondary | ICD-10-CM

## 2011-08-25 NOTE — Progress Notes (Signed)
Pt w/o c/o today, post sim done last week

## 2011-08-25 NOTE — Progress Notes (Signed)
[facility] Radiation Oncology Weekly Treatment Note    Name: ELEANA TOCCO Date: 08/25/2011 MRN: [id number] DOB: 1941/05/03  Status:outpatient    Current dose: 600  Current fraction:3  Planned dose:5000  Planned fraction:25    ALLERGIES: Levofloxacin; Sulfamethoxazole w/trimethoprim; and Sulfonamide derivatives      NARRATIVE: [name] was seen today for weekly treatment management. The chart was checked and port films images were reviewed. No complaints today.  PHYSICAL EXAMINATION: weight is 180 lb 14.4 oz (82.056 kg).       No skin changes.    ASSESSMENT: Patient tolerating treatments well.    PLAN: Continue treatment as planned.

## 2011-08-26 ENCOUNTER — Ambulatory Visit
Admission: RE | Admit: 2011-08-26 | Discharge: 2011-08-26 | Disposition: A | Discharge status: Home / Self Care | Payer: Medicare Other | Source: Ambulatory Visit | Attending: Radiation Oncology | Admitting: Radiation Oncology

## 2011-08-27 ENCOUNTER — Ambulatory Visit
Admission: RE | Admit: 2011-08-27 | Discharge: 2011-08-27 | Disposition: A | Discharge status: Home / Self Care | Payer: Medicare Other | Source: Ambulatory Visit | Attending: Radiation Oncology | Admitting: Radiation Oncology

## 2011-08-28 ENCOUNTER — Ambulatory Visit
Admission: RE | Admit: 2011-08-28 | Discharge: 2011-08-28 | Disposition: A | Discharge status: Home / Self Care | Payer: Medicare Other | Source: Ambulatory Visit | Attending: Radiation Oncology | Admitting: Radiation Oncology

## 2011-08-29 ENCOUNTER — Ambulatory Visit: Payer: Medicare Other

## 2011-08-29 ENCOUNTER — Ambulatory Visit
Admission: RE | Admit: 2011-08-29 | Discharge: 2011-08-29 | Disposition: A | Discharge status: Home / Self Care | Payer: Medicare Other | Source: Ambulatory Visit | Attending: Radiation Oncology | Admitting: Radiation Oncology

## 2011-08-30 ENCOUNTER — Ambulatory Visit
Admission: RE | Admit: 2011-08-30 | Discharge: 2011-08-30 | Disposition: A | Discharge status: Home / Self Care | Payer: Medicare Other | Source: Ambulatory Visit | Attending: Radiation Oncology | Admitting: Radiation Oncology

## 2011-09-01 ENCOUNTER — Ambulatory Visit
Admission: RE | Admit: 2011-09-01 | Discharge: 2011-09-01 | Disposition: A | Discharge status: Home / Self Care | Payer: Medicare Other | Source: Ambulatory Visit | Attending: Radiation Oncology | Admitting: Radiation Oncology

## 2011-09-01 VITALS — Wt 181.8 lb

## 2011-09-01 DIAGNOSIS — C50919 Malignant neoplasm of unspecified site of unspecified female breast: Secondary | ICD-10-CM

## 2011-09-01 NOTE — Progress Notes (Signed)
[facility] Radiation Oncology Weekly Treatment Note    Name: Alexandra Calderon Date: 09/01/2011 MRN: [id number] DOB: 1941/04/10  Status:outpatient    Current dose: 1800cGy  Current fraction:9  Planned dose:5000cGy  Planned fraction:25   ALLERGIES: Levofloxacin; Sulfamethoxazole w/trimethoprim; and Sulfonamide derivatives    NARRATIVE: Alexandra Calderon was seen today for weekly treatment management. The chart was checked and port films images were reviewed. She is using Radioplex gel when necessary. She does report some discomfort along her axillary fold.  PHYSICAL EXAMINATION: weight is 181 lb 12.8 oz (82.464 kg).         there is mild erythema along the right breast with more intense erythema over a 2-3 cm along her axillary fold.    ASSESSMENT: Patient tolerating treatments well. He focal erythema along her axillary fold is from her "high tangent fields" appearing   PLAN: Patient tolerating treatments well. Used Radioplex gel when necessary.

## 2011-09-01 NOTE — Progress Notes (Signed)
Pt has no c/o today. States skin slightly darker in her axilla.

## 2011-09-02 ENCOUNTER — Ambulatory Visit
Admission: RE | Admit: 2011-09-02 | Discharge: 2011-09-02 | Disposition: A | Discharge status: Home / Self Care | Payer: Medicare Other | Source: Ambulatory Visit | Attending: Radiation Oncology | Admitting: Radiation Oncology

## 2011-09-03 ENCOUNTER — Ambulatory Visit
Admission: RE | Admit: 2011-09-03 | Discharge: 2011-09-03 | Disposition: A | Discharge status: Home / Self Care | Payer: Medicare Other | Source: Ambulatory Visit | Attending: Radiation Oncology | Admitting: Radiation Oncology

## 2011-09-08 ENCOUNTER — Ambulatory Visit
Admission: RE | Admit: 2011-09-08 | Discharge: 2011-09-08 | Disposition: A | Discharge status: Home / Self Care | Payer: Medicare Other | Source: Ambulatory Visit | Attending: Radiation Oncology | Admitting: Radiation Oncology

## 2011-09-08 VITALS — Wt 183.2 lb

## 2011-09-08 DIAGNOSIS — C50919 Malignant neoplasm of unspecified site of unspecified female breast: Secondary | ICD-10-CM

## 2011-09-08 NOTE — Progress Notes (Signed)
Weekly Management Note:  Site:R Breast Current Dose:  2400  cGy Projected Dose: 5000  cGy  Narrative: The patient is seen today for routine under treatment assessment. CBCT/MVCT images/port films were reviewed. The chart was reviewed.   No complaints today. Not using Radioplex Gel yet.  Physical Examination: There were no vitals filed for this visit..  Weight: 183 lb 3.2 oz (83.099 kg). Mild erythema and mild hyperpigmentation of skin.  Impression: Tolerating radiation therapy well.  Plan: Continue radiation therapy as planned.

## 2011-09-08 NOTE — Progress Notes (Signed)
No c/o, slight darkening and tenderness r axilla.

## 2011-09-09 ENCOUNTER — Ambulatory Visit
Admission: RE | Admit: 2011-09-09 | Discharge: 2011-09-09 | Disposition: A | Discharge status: Home / Self Care | Payer: Medicare Other | Source: Ambulatory Visit | Attending: Radiation Oncology | Admitting: Radiation Oncology

## 2011-09-09 NOTE — Progress Notes (Signed)
CC:   Lodema Pilot, MD Gretta Cool, M.D. Maryln Gottron, M.D. Stacie Glaze, MD Hardin Negus  PROBLEM:  ER/PR positive breast cancer, status post lumpectomy.  Ms. Yano returns for followup.  Surgery performed on 07/01/2011 showed a 2.2-cm focus of grade 1 invasive ductal cancer.  Surgical margins were all clear.  One sentinel lymph node was negative, 1 sentinel lymph node did have a small focus of metastatic disease measuring 0.1 cm.  Again, the tumor grade was 1.  This was strongly ER and PR positive, as noted in the previous dictation, 100% and 99% respectively, proliferative index 16%.  Repeat HER-2 was negative with a ratio of 1.43.  Oncotype testing was performed.  This revealed a recurrence score of 15, corresponding to a risk of recurrence of 9%.  We had a fairly lengthy discussion about the pros and cons of chemotherapy in this setting, as well as the implications of the Oncotype score.  My personal view was that the Oncotype score at this point does give fairly important prognostic predictive data regarding risk of recurrence and potential benefits of chemotherapy in this setting.  Despite having a small focus of metastatic disease in 1 lymph node, at this point there is no data for further excision of lymph nodes based on Z11 data.  In addition, based on other data sets, patients with 1 to 3 positive involved lymph nodes with the _______ of the Oncotype score does in fact predict whether chemotherapy would be effective.  Once again, she does have a strongly ER/PR positive cancer and I suspect that most of the benefit of adjuvant therapy will be derived from using adjuvant hormonal therapy. The patient has been reassured and I will see her again in followup after she has completed radiation therapy.  She is agreeable to the plan.    ______________________________ Pierce Crane, M.D., F.R.C.P.C. PR/MEDQ  D:  09/09/2011  T:  09/09/2011  Job:  [id number]

## 2011-09-10 ENCOUNTER — Ambulatory Visit
Admission: RE | Admit: 2011-09-10 | Discharge: 2011-09-10 | Disposition: A | Discharge status: Home / Self Care | Payer: Medicare Other | Source: Ambulatory Visit | Attending: Radiation Oncology | Admitting: Radiation Oncology

## 2011-09-11 ENCOUNTER — Ambulatory Visit
Admission: RE | Admit: 2011-09-11 | Discharge: 2011-09-11 | Disposition: A | Discharge status: Home / Self Care | Payer: Medicare Other | Source: Ambulatory Visit | Attending: Radiation Oncology | Admitting: Radiation Oncology

## 2011-09-12 ENCOUNTER — Ambulatory Visit
Admission: RE | Admit: 2011-09-12 | Discharge: 2011-09-12 | Disposition: A | Discharge status: Home / Self Care | Payer: Medicare Other | Source: Ambulatory Visit | Attending: Radiation Oncology | Admitting: Radiation Oncology

## 2011-09-15 ENCOUNTER — Ambulatory Visit
Admission: RE | Admit: 2011-09-15 | Discharge: 2011-09-15 | Disposition: A | Discharge status: Home / Self Care | Payer: Medicare Other | Source: Ambulatory Visit | Attending: Radiation Oncology | Admitting: Radiation Oncology

## 2011-09-15 DIAGNOSIS — C50919 Malignant neoplasm of unspecified site of unspecified female breast: Secondary | ICD-10-CM

## 2011-09-15 NOTE — Progress Notes (Signed)
Weekly Management Note:  Site:R Breast Current Dose:  3400  cGy Projected Dose: 5000  cGy  Narrative: The patient is seen today for routine under treatment assessment. CBCT/MVCT images/port films were reviewed. The chart was reviewed.   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 She has not been using Radioplex gel.  Physical Examination: There were no vitals filed for this visit..  Weight: 182 lb 8 oz (82.781 kg). There is moderate erythema along the axillary fold and slight erythema along the right inframammary fold. No areas of desquamation.  Impression: Tolerating radiation therapy well.  Plan: Continue radiation therapy as planned. She may used Radioplex gel when necessary.

## 2011-09-15 NOTE — Progress Notes (Signed)
Pt seeing dr prior to tx due to trip she's taking this morning. No c/o verbalized.

## 2011-09-16 ENCOUNTER — Ambulatory Visit
Admission: RE | Admit: 2011-09-16 | Discharge: 2011-09-16 | Disposition: A | Discharge status: Home / Self Care | Payer: Medicare Other | Source: Ambulatory Visit | Attending: Radiation Oncology | Admitting: Radiation Oncology

## 2011-09-17 ENCOUNTER — Ambulatory Visit
Admission: RE | Admit: 2011-09-17 | Discharge: 2011-09-17 | Disposition: A | Discharge status: Home / Self Care | Payer: Medicare Other | Source: Ambulatory Visit | Attending: Radiation Oncology | Admitting: Radiation Oncology

## 2011-09-18 ENCOUNTER — Ambulatory Visit
Admission: RE | Admit: 2011-09-18 | Discharge: 2011-09-18 | Disposition: A | Discharge status: Home / Self Care | Payer: Medicare Other | Source: Ambulatory Visit | Attending: Radiation Oncology | Admitting: Radiation Oncology

## 2011-09-19 ENCOUNTER — Ambulatory Visit
Admission: RE | Admit: 2011-09-19 | Discharge: 2011-09-19 | Disposition: A | Discharge status: Home / Self Care | Payer: Medicare Other | Source: Ambulatory Visit | Attending: Radiation Oncology | Admitting: Radiation Oncology

## 2011-09-22 ENCOUNTER — Ambulatory Visit
Admission: RE | Admit: 2011-09-22 | Discharge: 2011-09-22 | Disposition: A | Discharge status: Home / Self Care | Payer: Medicare Other | Source: Ambulatory Visit | Attending: Radiation Oncology | Admitting: Radiation Oncology

## 2011-09-22 VITALS — Wt 181.9 lb

## 2011-09-22 DIAGNOSIS — C50919 Malignant neoplasm of unspecified site of unspecified female breast: Secondary | ICD-10-CM

## 2011-09-22 NOTE — Progress Notes (Signed)
22 of 25 radiation treatments completed to right breast .mild discoloration visible. No breaks in skin. Given instruction on caring for skin on completion tof treatment Thursday.pt. Will get 1 month follow up appt.

## 2011-09-22 NOTE — Progress Notes (Signed)
Weekly Management Note:  Site:R breast Current Dose:  4400  cGy Projected Dose: 5000  cGy  Narrative: The patient is seen today for routine under treatment assessment. CBCT/MVCT images/port films were reviewed. The chart was reviewed.   She is without complaints today except for occasional right breast "shooting pains" and slight axillary discomfort. She is using Radioplex gel at bedtime.  Physical Examination: There were no vitals filed for this visit..  Weight: 181 lb 14.4 oz (82.509 kg). There is moderate to marked erythema along the inferior axilla along the superior aspect of her tangent fields. There is no desquamation.  Impression: Tolerating radiation therapy well.  Plan: Continue radiation therapy as planned. She will finish her radiation therapy this Thursday.

## 2011-09-23 ENCOUNTER — Ambulatory Visit
Admission: RE | Admit: 2011-09-23 | Discharge: 2011-09-23 | Disposition: A | Discharge status: Home / Self Care | Payer: Medicare Other | Source: Ambulatory Visit | Attending: Radiation Oncology | Admitting: Radiation Oncology

## 2011-09-24 ENCOUNTER — Ambulatory Visit
Admission: RE | Admit: 2011-09-24 | Discharge: 2011-09-24 | Disposition: A | Discharge status: Home / Self Care | Payer: Medicare Other | Source: Ambulatory Visit | Attending: Radiation Oncology | Admitting: Radiation Oncology

## 2011-09-25 ENCOUNTER — Encounter: Payer: Self-pay | Admitting: Radiation Oncology

## 2011-09-25 ENCOUNTER — Ambulatory Visit
Admission: RE | Admit: 2011-09-25 | Discharge: 2011-09-25 | Disposition: A | Discharge status: Home / Self Care | Payer: Medicare Other | Source: Ambulatory Visit | Attending: Radiation Oncology | Admitting: Radiation Oncology

## 2011-09-25 NOTE — Progress Notes (Signed)
[facility] Radiation Oncology  Name: Alexandra Calderon MRN: [id number]  Date: 09/25/2011  DOB: November 20, 1940  Status:outpatient    CC: [name], MD   Pierce Crane, MD, Beather Arbour, MD Lodema Pilot, DO  REFERRING PHYSICIAN:   Darryll Capers, MD   DIAGNOSIS:  Stage IIB (T2 N38mi M0) invasive ductal carcinoma of the right breast  INDICATION FOR TREATMENT: Curative   TREATMENT DATES: 08/21/2011 through 09/25/2011   SITE/DOSE: Right breast 5000 cGy 25 sessions   BEAMS/ENERGY: Mixed 6 MV/10 MV photons tangential fields to the right breast   NARRATIVE: The patient tolerated her well with only moderate radiation dermatitis by completion of therapy. She used Radioplex gel when necessary.   PLAN: Routine followup in one month. Patient instructed to call if questions or worsening complaints in interim.

## 2011-09-26 ENCOUNTER — Ambulatory Visit: Payer: Medicare Other

## 2011-09-28 ENCOUNTER — Other Ambulatory Visit: Payer: Self-pay | Admitting: Internal Medicine

## 2011-09-29 NOTE — Telephone Encounter (Signed)
Medication filled for #90 pat needs and appointment after those are finished. Last seen 12/2009.

## 2011-10-04 ENCOUNTER — Other Ambulatory Visit: Payer: Self-pay | Admitting: Internal Medicine

## 2011-10-11 ENCOUNTER — Telehealth: Payer: Self-pay | Admitting: Oncology

## 2011-10-11 NOTE — Telephone Encounter (Signed)
Talked to pt, gave her appt for 1/25 lab draw and MD visit on 11/11/11

## 2011-10-22 ENCOUNTER — Ambulatory Visit (INDEPENDENT_AMBULATORY_CARE_PROVIDER_SITE_OTHER): Payer: Medicare Other | Admitting: General Surgery

## 2011-10-22 VITALS — BP 128/82 | HR 70 | Temp 98.1°F | Resp 16 | Ht 66.0 in | Wt 183.4 lb

## 2011-10-22 DIAGNOSIS — Z853 Personal history of malignant neoplasm of breast: Secondary | ICD-10-CM

## 2011-10-22 NOTE — Progress Notes (Signed)
Subjective:     Patient ID: Alexandra Calderon, female   DOB: [birth year], 71 y.o.   MRN: [id number]  HPI Status post right breast lumpectomy and sentinel lymph node biopsy for a T2 N30mi right breast cancer in September. She's been doing well and has finished her last dose radiation on December 13. She has some occasional discomfort in the area of the incision but otherwise no complaints. She denies any systemic symptoms or constitutional symptoms. She denies any palpable masses in the area. She is doing her breast exams once a month. She is on arimidex and has 2 to followup with Dr. Donnie Coffin in about 2 weeks.  Review of Systems     Objective:   Physical Exam No distress and nontoxic-appearing  Her right breast incision is well healed without any sign of infection or any evidence of recurrent masses. She has very good cosmesis and has had very little appreciable scarring. There is no lymphadenopathy  Her left breast exam is normal without any suspicious masses or lymphadenopathy or skin changes.    Assessment:     Status post right breast partial mastectomy and sentinel lymph node biopsy for breast cancer-doing well, no evidence of recurrence.    Plan:     She looks well and has no constitutional or systemic symptoms or evidence for recurrent disease. There is no evidence of recurrence on exam. She is due to followup with her oncologist in a few weeks. She was not offered chemotherapy initially. I have recommended continued monthly self breast exam and I recommended a followup mammogram in June which will be about 6 months from the completion of her radiation and I will see her back for repeat evaluation after this mammogram.

## 2011-10-28 ENCOUNTER — Encounter: Payer: Self-pay | Admitting: Radiation Oncology

## 2011-10-28 ENCOUNTER — Ambulatory Visit
Admission: RE | Admit: 2011-10-28 | Discharge: 2011-10-28 | Disposition: A | Discharge status: Home / Self Care | Payer: Medicare Other | Source: Ambulatory Visit | Attending: Radiation Oncology | Admitting: Radiation Oncology

## 2011-10-28 VITALS — BP 129/72 | HR 68 | Resp 18 | Wt 184.7 lb

## 2011-10-28 DIAGNOSIS — C50919 Malignant neoplasm of unspecified site of unspecified female breast: Secondary | ICD-10-CM

## 2011-10-28 NOTE — Progress Notes (Signed)
Patient presents to the clinic today unaccompanied for a follow up appointment with Dr. Dayton Scrape s/p breast xrt on 09/25/2011. Patient is alert and oriented to person, place, and time. No distress noted. Steady gait noted. Pleasant affect noted. Patient denies any pain at this time. Patient reports her right breast continues to be "tender." Patient reports "tanning" of the right breast but denies hyperpigmentation or any breaks of the skin. Patient reports she is scheduled to follow up with Dr. Donnie Coffin this month and her PCP, Dr. Lovell Sheehan, next month. Patient reports that she has been taking Arimidex since completing xrt and only complains of increased frequency of hot flashes. All findings reported to Dr. Dayton Scrape.

## 2011-10-28 NOTE — Progress Notes (Signed)
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 She is without complaints today, although she does have occasional right breast discomfort which is slowly improving. She is on adjuvant Arimidex through Dr. Donnie Coffin. She had a low Oncotype DX score. She saw Dr. Biagio Quint last week and she will see him again this summer. She will see Dr. Donnie Coffin later this month. She typically has her screening mammography in August of each year.  Physical examination: And neck examination grossly unremarkable. Nodes: Without palpable cervical, supraclavicular, or axillary lymphadenopathy. Chest: Lungs clear. Breasts: There is residual hyperpigmentation of the skin along the right breast with mild thickening. No masses are appreciated. Left breast without masses or lesions. Abdomen: Without hepatomegaly. Extremities: Without edema.  Impression: Satisfactory progress. I told the patient that she could wait until August to have bilateral mammography or she could have a right breast mammogram the spring and followup mammography in August. I've not scheduled the patient for a formal followup visit knowing that she be followed by Drs. Biagio Quint and Wade.

## 2011-11-07 ENCOUNTER — Other Ambulatory Visit (HOSPITAL_BASED_OUTPATIENT_CLINIC_OR_DEPARTMENT_OTHER): Payer: Medicare Other | Admitting: Lab

## 2011-11-07 DIAGNOSIS — C50119 Malignant neoplasm of central portion of unspecified female breast: Secondary | ICD-10-CM

## 2011-11-07 LAB — CBC WITH DIFFERENTIAL/PLATELET
BASO%: 0.2 % (ref 0.0–2.0)
Basophils Absolute: 0 10*3/uL (ref 0.0–0.1)
EOS%: 1.7 % (ref 0.0–7.0)
Eosinophils Absolute: 0.1 10*3/uL (ref 0.0–0.5)
HCT: 38.1 % (ref 34.8–46.6)
HGB: 13 g/dL (ref 11.6–15.9)
LYMPH%: 25.6 % (ref 14.0–49.7)
MCH: 29.9 pg (ref 25.1–34.0)
MCHC: 34.2 g/dL (ref 31.5–36.0)
MCV: 87.4 fL (ref 79.5–101.0)
MONO#: 0.4 10*3/uL (ref 0.1–0.9)
MONO%: 9.6 % (ref 0.0–14.0)
NEUT#: 2.6 10*3/uL (ref 1.5–6.5)
NEUT%: 62.9 % (ref 38.4–76.8)
Platelets: 208 10*3/uL (ref 145–400)
RBC: 4.36 10*6/uL (ref 3.70–5.45)
RDW: 14.1 % (ref 11.2–14.5)
WBC: 4.1 10*3/uL (ref 3.9–10.3)
lymph#: 1 10*3/uL (ref 0.9–3.3)

## 2011-11-07 LAB — COMPREHENSIVE METABOLIC PANEL WITH GFR
ALT: 19 U/L (ref 0–35)
AST: 23 U/L (ref 0–37)
Albumin: 4.1 g/dL (ref 3.5–5.2)
Alkaline Phosphatase: 99 U/L (ref 39–117)
BUN: 15 mg/dL (ref 6–23)
CO2: 30 meq/L (ref 19–32)
Calcium: 8.8 mg/dL (ref 8.4–10.5)
Chloride: 103 meq/L (ref 96–112)
Creatinine, Ser: 0.78 mg/dL (ref 0.50–1.10)
Glucose, Bld: 77 mg/dL (ref 70–99)
Potassium: 3.5 meq/L (ref 3.5–5.3)
Sodium: 142 meq/L (ref 135–145)
Total Bilirubin: 0.5 mg/dL (ref 0.3–1.2)
Total Protein: 6.7 g/dL (ref 6.0–8.3)

## 2011-11-07 LAB — VITAMIN D 25 HYDROXY (VIT D DEFICIENCY, FRACTURES): Vit D, 25-Hydroxy: 56 ng/mL (ref 30–89)

## 2011-11-11 ENCOUNTER — Ambulatory Visit (HOSPITAL_BASED_OUTPATIENT_CLINIC_OR_DEPARTMENT_OTHER): Payer: Medicare Other | Admitting: Oncology

## 2011-11-11 ENCOUNTER — Telehealth: Payer: Self-pay | Admitting: Oncology

## 2011-11-11 VITALS — BP 135/75 | HR 71 | Temp 98.4°F | Ht 66.0 in | Wt 185.4 lb

## 2011-11-11 DIAGNOSIS — C50919 Malignant neoplasm of unspecified site of unspecified female breast: Secondary | ICD-10-CM

## 2011-11-11 DIAGNOSIS — Z170 Estrogen receptor positive status [ER+]: Secondary | ICD-10-CM

## 2011-11-11 DIAGNOSIS — C50119 Malignant neoplasm of central portion of unspecified female breast: Secondary | ICD-10-CM

## 2011-11-11 NOTE — Telephone Encounter (Signed)
gve the pt her July 2013 appt calendar °

## 2011-11-18 ENCOUNTER — Ambulatory Visit (INDEPENDENT_AMBULATORY_CARE_PROVIDER_SITE_OTHER): Payer: Medicare Other | Admitting: Internal Medicine

## 2011-11-18 ENCOUNTER — Encounter: Payer: Self-pay | Admitting: Internal Medicine

## 2011-11-18 VITALS — BP 150/84 | HR 76 | Temp 98.3°F | Resp 16 | Ht 66.0 in | Wt 184.0 lb

## 2011-11-18 DIAGNOSIS — C50919 Malignant neoplasm of unspecified site of unspecified female breast: Secondary | ICD-10-CM

## 2011-11-18 DIAGNOSIS — E785 Hyperlipidemia, unspecified: Secondary | ICD-10-CM

## 2011-11-18 DIAGNOSIS — I10 Essential (primary) hypertension: Secondary | ICD-10-CM

## 2011-11-18 MED ORDER — VALSARTAN-HYDROCHLOROTHIAZIDE 320-25 MG PO TABS: 1.0000 | ORAL_TABLET | Freq: Every day | ORAL | Status: DC

## 2011-11-18 NOTE — Patient Instructions (Addendum)
Take a probiotic daily for the next 30 daysBack Exercises Back exercises help treat and prevent back injuries. The goal of back exercises is to increase the strength of your abdominal and back muscles and the flexibility of your back. These exercises should be started when you no longer have back pain. Back exercises include:  Pelvic Tilt. Lie on your back with your knees bent. Tilt your pelvis until the lower part of your back is against the floor. Hold this position 5 to 10 sec and repeat 5 to 10 times.   Knee to Chest. Pull first 1 knee up against your chest and hold for 20 to 30 seconds, repeat this with the other knee, and then both knees. This may be done with the other leg straight or bent, whichever feels better.   Sit-Ups or Curl-Ups. Bend your knees 90 degrees. Start with tilting your pelvis, and do a partial, slow sit-up, lifting your trunk only 30 to 45 degrees off the floor. Take at least 2 to 3 seconds for each sit-up. Do not do sit-ups with your knees out straight. If partial sit-ups are difficult, simply do the above but with only tightening your abdominal muscles and holding it as directed.   Hip-Lift. Lie on your back with your knees flexed 90 degrees. Push down with your feet and shoulders as you raise your hips a couple inches off the floor; hold for 10 seconds, repeat 5 to 10 times.   Back arches. Lie on your stomach, propping yourself up on bent elbows. Slowly press on your hands, causing an arch in your low back. Repeat 3 to 5 times. Any initial stiffness and discomfort should lessen with repetition over time.   Shoulder-Lifts. Lie face down with arms beside your body. Keep hips and torso pressed to floor as you slowly lift your head and shoulders off the floor.  Do not overdo your exercises, especially in the beginning. Exercises may cause you some mild back discomfort which lasts for a few minutes; however, if the pain is more severe, or lasts for more than 15 minutes, do not  continue exercises until you see your caregiver. Improvement with exercise therapy for back problems is slow.  See your caregivers for assistance with developing a proper back exercise program. Document Released: 11/06/2004 Document Revised: 05/28/2011 Document Reviewed: 09/29/2005 [facility] Patient Information 2012 [place], [place].

## 2011-11-18 NOTE — Progress Notes (Signed)
Subjective:    Patient ID: Alexandra Calderon, female    DOB: May 17, 1941, 71 y.o.   MRN: [id number]  HPI The patient is a 71 year old female who is followed for hypertension hyperlipidemia gastroesophageal reflux and breast cancer.  She has undergone a lumpectomy and radiation therapy and is now on adjuvant therapy with Arimidex. He is [facility].  Her blood pressure is up a little to today and that is a potential side effect of arimidex.   Review of Systems  Constitutional: Negative for activity change, appetite change and fatigue.  HENT: Negative for ear pain, congestion, neck pain, postnasal drip and sinus pressure.   Eyes: Negative for redness and visual disturbance.  Respiratory: Negative for cough, shortness of breath and wheezing.   Gastrointestinal: Negative for abdominal pain and abdominal distention.  Genitourinary: Negative for dysuria, frequency and menstrual problem.  Musculoskeletal: Negative for myalgias, joint swelling and arthralgias.  Skin: Negative for rash and wound.  Neurological: Negative for dizziness, weakness and headaches.  Hematological: Negative for adenopathy. Does not bruise/bleed easily.  Psychiatric/Behavioral: Negative for sleep disturbance and decreased concentration.   Past Medical History  Diagnosis Date  . Hypertension   . Osteopenia   . PVC (premature ventricular contraction)   . Hyperlipidemia   . Arthritis   . GERD (gastroesophageal reflux disease)   . Diverticulosis   . Kidney stones   . Pneumonia   . UTI (lower urinary tract infection)   . Allergy   . Cancer   . Hearing loss   . Palpitations     History   Social History  . Marital Status: Married    Spouse Name: N/A    Number of Children: N/A  . Years of Education: N/A   Occupational History  . retired    Social History Main Topics  . Smoking status: Never Smoker   . Smokeless tobacco: Not on file  . Alcohol Use: No  . Drug Use: No  .  Sexually Active: Yes   Other Topics Concern  . Not on file   Social History Narrative   Spouse:  Denton Brick LewisChildren:Kim Coble- age 80- Jahaira Earnhart- age 72- Ginette Otto    Past Surgical History  Procedure Date  . Kidney stones   . Dilation and curettage of uterus   . Shoulder arthroscopy rt 2009   . Shoulder surgery   . Breast lumpectomy 07/01/11    w/sentinal node bx - right side    Family History  Problem Relation Age of Onset  . Lymphoma Father   . Cancer Father   . Hodgkin's lymphoma Father   . Diabetes Maternal Aunt   . Diabetes Paternal Grandmother   . Cancer Mother     colon    Allergies  Allergen Reactions  . Levofloxacin     REACTION: rash  . Sulfamethoxazole W/Trimethoprim Rash and Other (See Comments)    Chills, fever  . Sulfonamide Derivatives Rash and Other (See Comments)    Chills, fever    Current Outpatient Prescriptions on File Prior to Visit  Medication Sig Dispense Refill  . ALPRAZolam (XANAX) 0.25 MG tablet Take 1 tablet (0.25 mg total) by mouth 3 (three) times daily as needed for anxiety.  30 tablet  2  . anastrozole (ARIMIDEX) 1 MG tablet Take 1 mg by mouth daily.      Marland Kitchen aspirin 81 MG tablet Take 81 mg by mouth daily.        Marland Kitchen atenolol (TENORMIN) 50  MG tablet TAKE 1 TABLET ONCE DAILY  90 tablet  1  . cephALEXin (KEFLEX) 500 MG capsule Take 500 mg by mouth as needed. 1 after sex       . cholecalciferol (VITAMIN D) 1000 UNITS tablet Take 2,000 Units by mouth 2 (two) times daily.       Marland Kitchen losartan-hydrochlorothiazide (HYZAAR) 100-25 MG per tablet Take 1 tablet by mouth daily.  90 tablet  3  . magnesium chloride (SLOW-MAG) 64 MG TBEC Take by mouth as needed.       . nitrofurantoin, macrocrystal-monohydrate, (MACROBID) 100 MG capsule Take 100 mg by mouth 1 day or 1 dose.        Marland Kitchen omeprazole (PRILOSEC) 20 MG capsule TAKE 1 CAPSULE DAILY  90 capsule  0  . simvastatin (ZOCOR) 20 MG tablet TAKE 1 TABLET DAILY  90 tablet  1    BP 150/84   Pulse 76  Temp 98.3 F (36.8 C)  Resp 16  Ht 5\' 6"  (1.676 m)  Wt 184 lb (83.462 kg)  BMI 29.70 kg/m2       Objective:   Physical Exam  Nursing note and vitals reviewed. Constitutional: She is oriented to person, place, and time. She appears well-developed and well-nourished. No distress.  HENT:  Head: Normocephalic and atraumatic.  Right Ear: External ear normal.  Left Ear: External ear normal.  Nose: Nose normal.  Mouth/Throat: Oropharynx is clear and moist.  Eyes: Conjunctivae and EOM are normal. Pupils are equal, round, and reactive to light.  Neck: Normal range of motion. Neck supple. No JVD present. No tracheal deviation present. No thyromegaly present.  Cardiovascular: Normal rate, regular rhythm, normal heart sounds and intact distal pulses.   No murmur heard. Pulmonary/Chest: Effort normal and breath sounds normal. She has no wheezes. She exhibits no tenderness.  Abdominal: Soft. Bowel sounds are normal.  Musculoskeletal: Normal range of motion. She exhibits no edema and no tenderness.  Lymphadenopathy:    She has no cervical adenopathy.  Neurological: She is alert and oriented to person, place, and time. She has normal reflexes. No cranial nerve deficit.  Skin: Skin is warm and dry. She is not diaphoretic.  Psychiatric: She has a normal mood and affect. Her behavior is normal.          Assessment & Plan:  The patient's blood pressure is poorly controlled on Hyzaar we will discontinue Hyzaar and switch to Diovan HCTZ 20/25. monitor the blood pressure reviewed the side effects of the arimidex

## 2011-12-08 ENCOUNTER — Telehealth: Payer: Self-pay | Admitting: Internal Medicine

## 2011-12-08 MED ORDER — ATENOLOL 50 MG PO TABS: 50.0000 mg | ORAL_TABLET | Freq: Every day | ORAL | Status: DC

## 2011-12-08 NOTE — Telephone Encounter (Signed)
atenolol (TENORMIN) 50 MG tablet - needs refill sent [facility]

## 2012-01-15 ENCOUNTER — Encounter: Payer: Self-pay | Admitting: Internal Medicine

## 2012-01-15 ENCOUNTER — Ambulatory Visit (INDEPENDENT_AMBULATORY_CARE_PROVIDER_SITE_OTHER): Payer: Medicare Other | Admitting: Internal Medicine

## 2012-01-15 VITALS — BP 135/72 | HR 72 | Temp 98.6°F | Resp 16 | Ht 66.0 in | Wt 188.0 lb

## 2012-01-15 DIAGNOSIS — I10 Essential (primary) hypertension: Secondary | ICD-10-CM

## 2012-01-15 DIAGNOSIS — K219 Gastro-esophageal reflux disease without esophagitis: Secondary | ICD-10-CM

## 2012-01-15 DIAGNOSIS — Z853 Personal history of malignant neoplasm of breast: Secondary | ICD-10-CM

## 2012-01-15 LAB — BASIC METABOLIC PANEL WITH GFR
BUN: 13 mg/dL (ref 6–23)
CO2: 32 meq/L (ref 19–32)
Calcium: 9.4 mg/dL (ref 8.4–10.5)
Chloride: 103 meq/L (ref 96–112)
Creatinine, Ser: 0.8 mg/dL (ref 0.4–1.2)
GFR: 73.47 mL/min
Glucose, Bld: 103 mg/dL — ABNORMAL HIGH (ref 70–99)
Potassium: 4 meq/L (ref 3.5–5.1)
Sodium: 144 meq/L (ref 135–145)

## 2012-01-15 MED ORDER — OMEPRAZOLE 20 MG PO CPDR: 20.0000 mg | DELAYED_RELEASE_CAPSULE | Freq: Every day | ORAL | Status: DC

## 2012-01-15 NOTE — Progress Notes (Signed)
Subjective:    Patient ID: Alexandra Calderon, female    DOB: [birth year], 71 y.o.   MRN: [id number]  HPI Patient is a 71 year old white female with a history of GERD hyperlipidemia treatment for breast cancer followed by the oncology service who presents for monitoring of the change in her blood pressure medications.  We added a diuretic to the ARB for better control of her blood pressure her blood pressure today is within desired range.  She is tolerating the medicine well she denies any chest pain or abnormal shortness of breath PND or orthopnea.  She does require her proton pump inhibitor to be refilled she was placed on Prilosec 20 mg by mouth daily by gastroenterology.     Review of Systems  Constitutional: Negative for activity change, appetite change and fatigue.  HENT: Negative for ear pain, congestion, neck pain, postnasal drip and sinus pressure.   Eyes: Negative for redness and visual disturbance.  Respiratory: Negative for cough, shortness of breath and wheezing.   Gastrointestinal: Negative for abdominal pain and abdominal distention.  Genitourinary: Negative for dysuria, frequency and menstrual problem.  Musculoskeletal: Negative for myalgias, joint swelling and arthralgias.  Skin: Negative for rash and wound.  Neurological: Negative for dizziness, weakness and headaches.  Hematological: Negative for adenopathy. Does not bruise/bleed easily.  Psychiatric/Behavioral: Negative for sleep disturbance and decreased concentration.   Past Medical History  Diagnosis Date  . Hypertension   . Osteopenia   . PVC (premature ventricular contraction)   . Hyperlipidemia   . Arthritis   . GERD (gastroesophageal reflux disease)   . Diverticulosis   . Kidney stones   . Pneumonia   . UTI (lower urinary tract infection)   . Allergy   . Cancer   . Hearing loss   . Palpitations     History   Social History  . Marital Status: Married    Spouse Name: N/A    Number of Children:  N/A  . Years of Education: N/A   Occupational History  . retired    Social History Main Topics  . Smoking status: Never Smoker   . Smokeless tobacco: Not on file  . Alcohol Use: No  . Drug Use: No  . Sexually Active: Yes   Other Topics Concern  . Not on file   Social History Narrative   Spouse:  Denton Brick LewisChildren:Kim Coble- age 16- Maysa Lynn- age 69- Ginette Otto    Past Surgical History  Procedure Date  . Kidney stones   . Dilation and curettage of uterus   . Shoulder arthroscopy rt 2009   . Shoulder surgery   . Breast lumpectomy 07/01/11    w/sentinal node bx - right side    Family History  Problem Relation Age of Onset  . Lymphoma Father   . Cancer Father   . Hodgkin's lymphoma Father   . Diabetes Maternal Aunt   . Diabetes Paternal Grandmother   . Cancer Mother     colon    Allergies  Allergen Reactions  . Levofloxacin     REACTION: rash  . Sulfamethoxazole W/Trimethoprim Rash and Other (See Comments)    Chills, fever  . Sulfonamide Derivatives Rash and Other (See Comments)    Chills, fever    Current Outpatient Prescriptions on File Prior to Visit  Medication Sig Dispense Refill  . ALPRAZolam (XANAX) 0.25 MG tablet Take 1 tablet (0.25 mg total) by mouth 3 (three) times daily as needed for anxiety.  30 tablet  2  .  anastrozole (ARIMIDEX) 1 MG tablet Take 1 mg by mouth daily.      Marland Kitchen aspirin 81 MG tablet Take 81 mg by mouth daily.        Marland Kitchen atenolol (TENORMIN) 50 MG tablet Take 1 tablet (50 mg total) by mouth daily.  90 tablet  3  . cephALEXin (KEFLEX) 500 MG capsule Take 500 mg by mouth as needed. 1 after sex       . cholecalciferol (VITAMIN D) 1000 UNITS tablet Take 2,000 Units by mouth 2 (two) times daily.       . magnesium chloride (SLOW-MAG) 64 MG TBEC Take by mouth as needed.       . nitrofurantoin, macrocrystal-monohydrate, (MACROBID) 100 MG capsule Take 100 mg by mouth 1 day or 1 dose.        . simvastatin (ZOCOR) 20 MG tablet TAKE 1  TABLET DAILY  90 tablet  1  . valsartan-hydrochlorothiazide (DIOVAN HCT) 320-25 MG per tablet Take 1 tablet by mouth daily.  90 tablet  3  . DISCONTD: omeprazole (PRILOSEC) 20 MG capsule TAKE 1 CAPSULE DAILY  90 capsule  0    BP 135/72  Pulse 72  Temp 98.6 F (37 C)  Resp 16  Ht 5\' 6"  (1.676 m)  Wt 188 lb (85.276 kg)  BMI 30.34 kg/m2       Objective:   Physical Exam  Nursing note and vitals reviewed. Constitutional: She is oriented to person, place, and time. She appears well-developed and well-nourished. No distress.  HENT:  Head: Normocephalic and atraumatic.  Right Ear: External ear normal.  Left Ear: External ear normal.  Nose: Nose normal.  Mouth/Throat: Oropharynx is clear and moist.  Eyes: Conjunctivae and EOM are normal. Pupils are equal, round, and reactive to light.  Neck: Normal range of motion. Neck supple. No JVD present. No tracheal deviation present. No thyromegaly present.  Cardiovascular: Normal rate, regular rhythm, normal heart sounds and intact distal pulses.   No murmur heard. Pulmonary/Chest: Effort normal and breath sounds normal. She has no wheezes. She exhibits no tenderness.  Abdominal: Soft. Bowel sounds are normal.  Musculoskeletal: Normal range of motion. She exhibits no edema and no tenderness.  Lymphadenopathy:    She has no cervical adenopathy.  Neurological: She is alert and oriented to person, place, and time. She has normal reflexes. No cranial nerve deficit.  Skin: Skin is warm and dry. She is not diaphoretic.  Psychiatric: She has a normal mood and affect. Her behavior is normal.          Assessment & Plan:  Improved blood pressure control with new medications recommend checking a basic metabolic panel today to assess potassium and creatinine on diuretic.  We'll refill Prilosec as do 2 GERD history prescription was sent to mail order pharmacy.  History of breast cancer reviewed her recent oncology visit.

## 2012-01-15 NOTE — Patient Instructions (Signed)
The patient is instructed to continue all medications as prescribed. Schedule followup with check out clerk upon leaving the clinic  

## 2012-01-30 ENCOUNTER — Other Ambulatory Visit: Payer: Medicare Other | Admitting: Lab

## 2012-02-04 ENCOUNTER — Ambulatory Visit: Payer: Medicare Other | Admitting: Oncology

## 2012-04-09 ENCOUNTER — Other Ambulatory Visit: Payer: Self-pay | Admitting: *Deleted

## 2012-04-09 MED ORDER — SIMVASTATIN 20 MG PO TABS: 20.0000 mg | ORAL_TABLET | Freq: Every day | ORAL | Status: DC

## 2012-04-19 ENCOUNTER — Ambulatory Visit (INDEPENDENT_AMBULATORY_CARE_PROVIDER_SITE_OTHER): Payer: Medicare Other | Admitting: Internal Medicine

## 2012-04-19 ENCOUNTER — Encounter: Payer: Self-pay | Admitting: Internal Medicine

## 2012-04-19 VITALS — BP 136/80 | HR 72 | Temp 98.1°F | Resp 16 | Ht 66.0 in | Wt 188.0 lb

## 2012-04-19 DIAGNOSIS — M818 Other osteoporosis without current pathological fracture: Secondary | ICD-10-CM

## 2012-04-19 DIAGNOSIS — T887XXA Unspecified adverse effect of drug or medicament, initial encounter: Secondary | ICD-10-CM

## 2012-04-19 DIAGNOSIS — E785 Hyperlipidemia, unspecified: Secondary | ICD-10-CM

## 2012-04-19 LAB — HEPATIC FUNCTION PANEL
ALT: 15 U/L (ref 0–35)
AST: 17 U/L (ref 0–37)
Albumin: 3.7 g/dL (ref 3.5–5.2)
Alkaline Phosphatase: 91 U/L (ref 39–117)
Bilirubin, Direct: 0 mg/dL (ref 0.0–0.3)
Total Bilirubin: 0.6 mg/dL (ref 0.3–1.2)
Total Protein: 7.5 g/dL (ref 6.0–8.3)

## 2012-04-19 LAB — LIPID PANEL
Cholesterol: 161 mg/dL (ref 0–200)
HDL: 42.2 mg/dL
LDL Cholesterol: 86 mg/dL (ref 0–99)
Total CHOL/HDL Ratio: 4
Triglycerides: 166 mg/dL — ABNORMAL HIGH (ref 0.0–149.0)
VLDL: 33.2 mg/dL (ref 0.0–40.0)

## 2012-04-19 MED ORDER — OMEGA-3 FATTY ACIDS 1000 MG PO CAPS: 2.0000 g | ORAL_CAPSULE | Freq: Every day | ORAL | Status: AC

## 2012-04-19 NOTE — Patient Instructions (Signed)
The patient is instructed to continue all medications as prescribed. Schedule followup with check out clerk upon leaving the clinic  

## 2012-04-19 NOTE — Progress Notes (Signed)
Subjective:    Patient ID: Alexandra Calderon, female    DOB: 07-12-1941, 71 y.o.   MRN: [id number]  HPI  Patient is a 71 year old female followed for hyperlipidemia gastroesophageal reflux a history of breast cancer osteoarthritis/osteoporosis and a chief complaint of increased cramping in the abdominal wall and legs she states that when she laughs or sneezes she can get cramping and abdominal wall.  She's been compliant with her medications except for magnesium which she has taken intermittently  Review of Systems  Constitutional: Negative for activity change, appetite change and fatigue.  HENT: Negative for ear pain, congestion, neck pain, postnasal drip and sinus pressure.   Eyes: Negative for redness and visual disturbance.  Respiratory: Negative for cough, shortness of breath and wheezing.   Gastrointestinal: Negative for abdominal pain and abdominal distention.  Genitourinary: Negative for dysuria, frequency and menstrual problem.  Musculoskeletal: Negative for myalgias, joint swelling and arthralgias.  Skin: Negative for rash and wound.  Neurological: Negative for dizziness, weakness and headaches.  Hematological: Negative for adenopathy. Does not bruise/bleed easily.  Psychiatric/Behavioral: Negative for disturbed wake/sleep cycle and decreased concentration.    The patient is instructed to continue all medications as prescribed. Schedule followup with check out clerk upon leaving the clinic Past Medical History  Diagnosis Date  . Hypertension   . Osteopenia   . PVC (premature ventricular contraction)   . Hyperlipidemia   . Arthritis   . GERD (gastroesophageal reflux disease)   . Diverticulosis   . Kidney stones   . Pneumonia   . UTI (lower urinary tract infection)   . Allergy   . Cancer   . Hearing loss   . Palpitations     History   Social History  . Marital Status: Married    Spouse Name: N/A    Number of Children: N/A  . Years of Education: N/A    Occupational History  . retired    Social History Main Topics  . Smoking status: Never Smoker   . Smokeless tobacco: Not on file  . Alcohol Use: No  . Drug Use: No  . Sexually Active: Yes   Other Topics Concern  . Not on file   Social History Narrative   Spouse:  Denton Brick LewisChildren:Kim Coble- age 81- Ayani Ospina- age 3- Ginette Otto    Past Surgical History  Procedure Date  . Kidney stones   . Dilation and curettage of uterus   . Shoulder arthroscopy rt 2009   . Shoulder surgery   . Breast lumpectomy 07/01/11    w/sentinal node bx - right side    Family History  Problem Relation Age of Onset  . Lymphoma Father   . Cancer Father   . Hodgkin's lymphoma Father   . Diabetes Maternal Aunt   . Diabetes Paternal Grandmother   . Cancer Mother     colon    Allergies  Allergen Reactions  . Levofloxacin     REACTION: rash  . Sulfamethoxazole W-Trimethoprim Rash and Other (See Comments)    Chills, fever  . Sulfonamide Derivatives Rash and Other (See Comments)    Chills, fever    Current Outpatient Prescriptions on File Prior to Visit  Medication Sig Dispense Refill  . ALPRAZolam (XANAX) 0.25 MG tablet Take 1 tablet (0.25 mg total) by mouth 3 (three) times daily as needed for anxiety.  30 tablet  2  . anastrozole (ARIMIDEX) 1 MG tablet Take 1 mg by mouth daily.      Marland Kitchen aspirin 81  MG tablet Take 81 mg by mouth daily.        Marland Kitchen atenolol (TENORMIN) 50 MG tablet Take 1 tablet (50 mg total) by mouth daily.  90 tablet  3  . cephALEXin (KEFLEX) 500 MG capsule Take 500 mg by mouth as needed. 1 after sex       . cholecalciferol (VITAMIN D) 1000 UNITS tablet Take 2,000 Units by mouth 2 (two) times daily.       . magnesium chloride (SLOW-MAG) 64 MG TBEC Take by mouth as needed.       Marland Kitchen omeprazole (PRILOSEC) 20 MG capsule Take 1 capsule (20 mg total) by mouth daily.  90 capsule  3  . simvastatin (ZOCOR) 20 MG tablet Take 1 tablet (20 mg total) by mouth daily.  90 tablet  3   . valsartan-hydrochlorothiazide (DIOVAN HCT) 320-25 MG per tablet Take 1 tablet by mouth daily.  90 tablet  3    BP 136/80  Pulse 72  Temp 98.1 F (36.7 C)  Resp 16  Ht 5\' 6"  (1.676 m)  Wt 188 lb (85.276 kg)  BMI 30.34 kg/m2      Objective:   Physical Exam  Constitutional: She is oriented to person, place, and time. She appears well-developed and well-nourished. No distress.  HENT:  Head: Normocephalic and atraumatic.  Right Ear: External ear normal.  Left Ear: External ear normal.  Nose: Nose normal.  Mouth/Throat: Oropharynx is clear and moist.  Eyes: Conjunctivae and EOM are normal. Pupils are equal, round, and reactive to light.  Neck: Normal range of motion. Neck supple. No JVD present. No tracheal deviation present. No thyromegaly present.  Cardiovascular: Normal rate, regular rhythm, normal heart sounds and intact distal pulses.   No murmur heard. Pulmonary/Chest: Effort normal and breath sounds normal. She has no wheezes. She exhibits no tenderness.  Abdominal: Soft. Bowel sounds are normal.  Musculoskeletal: Normal range of motion. She exhibits no edema and no tenderness.  Lymphadenopathy:    She has no cervical adenopathy.  Neurological: She is alert and oriented to person, place, and time. She has normal reflexes. No cranial nerve deficit.  Skin: Skin is warm and dry. She is not diaphoretic.  Psychiatric: She has a normal mood and affect. Her behavior is normal.          Assessment & Plan:  monitor lipids Stable blood pressure Hx of breast cancer followed by oncology Stable on current medicatons muscle cramps, encourage use of magnesium

## 2012-04-20 LAB — VITAMIN D 25 HYDROXY (VIT D DEFICIENCY, FRACTURES): Vit D, 25-Hydroxy: 58 ng/mL (ref 30–89)

## 2012-04-26 ENCOUNTER — Other Ambulatory Visit: Payer: Self-pay | Admitting: Gynecology

## 2012-04-26 DIAGNOSIS — Z853 Personal history of malignant neoplasm of breast: Secondary | ICD-10-CM

## 2012-04-26 DIAGNOSIS — Z9889 Other specified postprocedural states: Secondary | ICD-10-CM

## 2012-05-06 ENCOUNTER — Other Ambulatory Visit (HOSPITAL_BASED_OUTPATIENT_CLINIC_OR_DEPARTMENT_OTHER): Payer: Medicare Other | Admitting: Lab

## 2012-05-06 DIAGNOSIS — C50119 Malignant neoplasm of central portion of unspecified female breast: Secondary | ICD-10-CM

## 2012-05-06 LAB — CBC WITH DIFFERENTIAL/PLATELET
BASO%: 0.7 % (ref 0.0–2.0)
Basophils Absolute: 0 10*3/uL (ref 0.0–0.1)
EOS%: 1.9 % (ref 0.0–7.0)
Eosinophils Absolute: 0.1 10*3/uL (ref 0.0–0.5)
HCT: 37.8 % (ref 34.8–46.6)
HGB: 12.6 g/dL (ref 11.6–15.9)
LYMPH%: 27.6 % (ref 14.0–49.7)
MCH: 29 pg (ref 25.1–34.0)
MCHC: 33.4 g/dL (ref 31.5–36.0)
MCV: 87 fL (ref 79.5–101.0)
MONO#: 0.5 10*3/uL (ref 0.1–0.9)
MONO%: 9.9 % (ref 0.0–14.0)
NEUT#: 2.7 10*3/uL (ref 1.5–6.5)
NEUT%: 59.9 % (ref 38.4–76.8)
Platelets: 205 10*3/uL (ref 145–400)
RBC: 4.35 10*6/uL (ref 3.70–5.45)
RDW: 14.4 % (ref 11.2–14.5)
WBC: 4.6 10*3/uL (ref 3.9–10.3)
lymph#: 1.3 10*3/uL (ref 0.9–3.3)

## 2012-05-07 LAB — COMPREHENSIVE METABOLIC PANEL WITH GFR
ALT: 14 U/L (ref 0–35)
AST: 15 U/L (ref 0–37)
Albumin: 3.9 g/dL (ref 3.5–5.2)
Alkaline Phosphatase: 94 U/L (ref 39–117)
BUN: 14 mg/dL (ref 6–23)
CO2: 31 meq/L (ref 19–32)
Calcium: 9.1 mg/dL (ref 8.4–10.5)
Chloride: 103 meq/L (ref 96–112)
Creatinine, Ser: 0.89 mg/dL (ref 0.50–1.10)
Glucose, Bld: 91 mg/dL (ref 70–99)
Potassium: 3.6 meq/L (ref 3.5–5.3)
Sodium: 141 meq/L (ref 135–145)
Total Bilirubin: 0.5 mg/dL (ref 0.3–1.2)
Total Protein: 6.6 g/dL (ref 6.0–8.3)

## 2012-05-07 LAB — VITAMIN D 25 HYDROXY (VIT D DEFICIENCY, FRACTURES): Vit D, 25-Hydroxy: 65 ng/mL (ref 30–89)

## 2012-05-12 ENCOUNTER — Other Ambulatory Visit: Payer: Self-pay | Admitting: Gynecology

## 2012-05-13 ENCOUNTER — Ambulatory Visit (HOSPITAL_BASED_OUTPATIENT_CLINIC_OR_DEPARTMENT_OTHER): Payer: Medicare Other | Admitting: Oncology

## 2012-05-13 ENCOUNTER — Encounter: Payer: Self-pay | Admitting: Family

## 2012-05-13 VITALS — BP 121/67 | HR 65 | Temp 98.2°F | Ht 66.0 in | Wt 186.1 lb

## 2012-05-13 DIAGNOSIS — Z170 Estrogen receptor positive status [ER+]: Secondary | ICD-10-CM

## 2012-05-13 DIAGNOSIS — C50919 Malignant neoplasm of unspecified site of unspecified female breast: Secondary | ICD-10-CM

## 2012-05-13 NOTE — Progress Notes (Signed)
Hematology and Oncology Follow Up Visit  Alexandra Calderon [id number] 30-May-1941 71 y.o. 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 She has no complaints but is anxious in Gen. There've been no real changes in her medications. She is due for followup mammogram in this month.  Medications: I have reviewed the patient's current medications.  Allergies:  Allergies  Allergen Reactions  . Levofloxacin     REACTION: rash  . Sulfamethoxazole W-Trimethoprim Rash and Other (See Comments)    Chills, fever  . Sulfonamide Derivatives Rash and Other (See Comments)    Chills, fever    Past Medical History, Surgical history, Social history, and Family History were reviewed and updated.  Review of Systems: Constitutional:  Negative for fever, chills, night sweats, anorexia, weight loss, pain. Cardiovascular: no chest pain or dyspnea on exertion Respiratory: negative Neurological: negative Dermatological: negative ENT: negative Skin Gastrointestinal: negative Genito-Urinary: negative Hematological and Lymphatic: negative Breast: negative Musculoskeletal: negative Remaining ROS negative.  Physical Exam:  Blood pressure 121/67, pulse 65, temperature 98.2 F (36.8 C), height 5\' 6"  (1.676 m), weight 186 lb 1.6 oz (84.414 kg).  ECOG: 0   HEENT:  Sclerae anicteric, conjunctivae pink.  Oropharynx clear.  No mucositis or candidiasis.  Nodes:  No cervical, supraclavicular, or axillary lymphadenopathy palpated.  Breast Exam:  Right breast is status post lumpectomy. There is a well-healed scar on the nipple areola complex with some firmness and slight degree of induration likely representing residual seroma.   Left breast is benign.  No masses, discharge, skin change, or nipple inversion..  Lungs:  Clear to  auscultation bilaterally.  No crackles, rhonchi, or wheezes.  Heart:  Regular rate and rhythm.  Abdomen:  Soft, nontender.  Positive bowel sounds.  No organomegaly or masses palpated.  Musculoskeletal:  No focal spinal tenderness to palpation.  Extremities:  Benign.  No peripheral edema or cyanosis.  Skin:  Benign.  Neuro:  Nonfocal.    Lab Results: Lab Results  Component Value Date   WBC 4.6 05/06/2012   HGB 12.6 05/06/2012   HCT 37.8 05/06/2012   MCV 87.0 05/06/2012   PLT 205 05/06/2012     Chemistry      Component Value Date/Time   NA 141 05/06/2012 1028   K 3.6 05/06/2012 1028   CL 103 05/06/2012 1028   CO2 31 05/06/2012 1028   BUN 14 05/06/2012 1028   CREATININE 0.89 05/06/2012 1028      Component Value Date/Time   CALCIUM 9.1 05/06/2012 1028   ALKPHOS 94 05/06/2012 1028   AST 15 05/06/2012 1028   ALT 14 05/06/2012 1028   BILITOT 0.5 05/06/2012 1028       Radiological Studies:  No results found.   IMPRESSIONS AND PLAN: A 71 y.o. female with   History of ER/PR positive breast cancer on Arimidex, being tolerated well. I will see her in 6 months time.  Spent more than half the time coordinating care, as well as discussion of BMI and its implications.      Eddie Payette 8/1/20139:55 AM Cell [phone]

## 2012-06-02 ENCOUNTER — Ambulatory Visit
Admission: RE | Admit: 2012-06-02 | Discharge: 2012-06-02 | Disposition: A | Discharge status: Home / Self Care | Payer: Medicare Other | Source: Ambulatory Visit | Attending: Gynecology | Admitting: Gynecology

## 2012-06-02 DIAGNOSIS — Z853 Personal history of malignant neoplasm of breast: Secondary | ICD-10-CM

## 2012-06-02 DIAGNOSIS — Z9889 Other specified postprocedural states: Secondary | ICD-10-CM

## 2012-06-29 ENCOUNTER — Ambulatory Visit (INDEPENDENT_AMBULATORY_CARE_PROVIDER_SITE_OTHER): Payer: Medicare Other | Admitting: Internal Medicine

## 2012-06-29 DIAGNOSIS — Z23 Encounter for immunization: Secondary | ICD-10-CM

## 2012-07-20 ENCOUNTER — Encounter: Payer: Medicare Other | Admitting: Internal Medicine

## 2012-07-30 ENCOUNTER — Telehealth: Payer: Self-pay | Admitting: Internal Medicine

## 2012-07-30 NOTE — Telephone Encounter (Signed)
Dr. Leone Payor do you approve of switch to Dr. Juanda Chance

## 2012-07-30 NOTE — Telephone Encounter (Signed)
Its ok with me but with Dr. Juanda Chance slowing down ? If this makes sense overall

## 2012-07-30 NOTE — Telephone Encounter (Signed)
Patient advised of Dr. Regino Schultze plans to retire in the next few weeks and that there are no available appts with her until December.  Patient states she will stay with Dr. Leone Payor.  I have scheduled an appt for 08/27/12.  She has had alternating bowel habits for the last year of so.  She just completed tx for breast cancer and wants to discuss a colonoscopy.

## 2012-08-10 ENCOUNTER — Other Ambulatory Visit: Payer: Self-pay | Admitting: Emergency Medicine

## 2012-08-10 MED ORDER — ANASTROZOLE 1 MG PO TABS: 1.0000 mg | ORAL_TABLET | Freq: Every day | ORAL | Status: DC

## 2012-08-13 ENCOUNTER — Encounter: Payer: Self-pay | Admitting: Internal Medicine

## 2012-08-13 ENCOUNTER — Ambulatory Visit (INDEPENDENT_AMBULATORY_CARE_PROVIDER_SITE_OTHER): Payer: Medicare Other | Admitting: Internal Medicine

## 2012-08-13 VITALS — BP 140/80 | HR 76 | Temp 98.6°F | Resp 16 | Ht 66.0 in | Wt 186.0 lb

## 2012-08-13 DIAGNOSIS — M24849 Other specific joint derangements of unspecified hand, not elsewhere classified: Secondary | ICD-10-CM

## 2012-08-13 DIAGNOSIS — I10 Essential (primary) hypertension: Secondary | ICD-10-CM

## 2012-08-13 DIAGNOSIS — K219 Gastro-esophageal reflux disease without esophagitis: Secondary | ICD-10-CM

## 2012-08-13 DIAGNOSIS — M249 Joint derangement, unspecified: Secondary | ICD-10-CM

## 2012-08-13 DIAGNOSIS — E785 Hyperlipidemia, unspecified: Secondary | ICD-10-CM

## 2012-08-13 DIAGNOSIS — M1990 Unspecified osteoarthritis, unspecified site: Secondary | ICD-10-CM

## 2012-08-13 MED ORDER — METHYLPREDNISOLONE ACETATE 40 MG/ML IJ SUSP: 40.0000 mg | Freq: Once | INTRAMUSCULAR | Status: DC

## 2012-08-13 MED ORDER — FAMOTIDINE 40 MG PO TABS: 40.0000 mg | ORAL_TABLET | Freq: Every day | ORAL | Status: DC

## 2012-08-13 NOTE — Patient Instructions (Addendum)
Practical paleo 

## 2012-08-13 NOTE — Progress Notes (Signed)
Subjective:    Patient ID: Alexandra Calderon, female    DOB: [birth year], 71 y.o.   MRN: [id number]  HPI Patient is a 71 year old female is followed for hypertension hyperlipidemia gastroesophageal reflux and also for inflammation of her home with a history of osteoarthritis and painful manipulation of the thenar ambulance.  She has been doing reasonably well without any increased GERD but she is concerned about taking a PPI long-term because of its effect or obstruction of multivitamins.   Review of Systems  Constitutional: Negative for activity change, appetite change and fatigue.  HENT: Negative for ear pain, congestion, neck pain, postnasal drip and sinus pressure.   Eyes: Negative for redness and visual disturbance.  Respiratory: Negative for cough, shortness of breath and wheezing.   Gastrointestinal: Negative for abdominal pain and abdominal distention.  Genitourinary: Negative for dysuria, frequency and menstrual problem.  Musculoskeletal: Positive for joint swelling and arthralgias. Negative for myalgias.  Skin: Negative for rash and wound.  Neurological: Negative for dizziness, weakness and headaches.  Hematological: Negative for adenopathy. Does not bruise/bleed easily.  Psychiatric/Behavioral: Negative for disturbed wake/sleep cycle and decreased concentration.       Objective:   Physical Exam  Nursing note and vitals reviewed. Constitutional: She is oriented to person, place, and time. She appears well-developed and well-nourished. No distress.  HENT:  Head: Normocephalic and atraumatic.  Right Ear: External ear normal.  Left Ear: External ear normal.  Nose: Nose normal.  Mouth/Throat: Oropharynx is clear and moist.  Eyes: Conjunctivae normal and EOM are normal. Pupils are equal, round, and reactive to light.  Neck: Normal range of motion. Neck supple. No JVD present. No tracheal deviation present. No thyromegaly present.  Cardiovascular: Normal rate, regular rhythm,  normal heart sounds and intact distal pulses.   No murmur heard. Pulmonary/Chest: Effort normal and breath sounds normal. She has no wheezes. She exhibits no tenderness.  Abdominal: Soft. Bowel sounds are normal.  Musculoskeletal: Normal range of motion. She exhibits edema and tenderness.  Lymphadenopathy:    She has no cervical adenopathy.  Neurological: She is alert and oriented to person, place, and time. She has normal reflexes. No cranial nerve deficit.  Skin: Skin is warm and dry. She is not diaphoretic.  Psychiatric: She has a normal mood and affect. Her behavior is normal.    Past Medical History  Diagnosis Date  . Hypertension   . Osteopenia   . PVC (premature ventricular contraction)   . Hyperlipidemia   . Arthritis   . GERD (gastroesophageal reflux disease)   . Diverticulosis   . Kidney stones   . Pneumonia   . UTI (lower urinary tract infection)   . Allergy   . Hearing loss   . Palpitations   . Breast cancer     History   Social History  . Marital Status: Married    Spouse Name: N/A    Number of Children: N/A  . Years of Education: N/A   Occupational History  . retired    Social History Main Topics  . Smoking status: Never Smoker   . Smokeless tobacco: Not on file  . Alcohol Use: No  . Drug Use: No  . Sexually Active: Yes   Other Topics Concern  . Not on file   Social History Narrative   Spouse:  Denton Brick LewisChildren:Kim Coble- age 64- Mataya Kilduff- age 8- Ginette Otto    Past Surgical History  Procedure Date  . Kidney stones   . Dilation and curettage of  uterus   . Shoulder arthroscopy rt 2009   . Shoulder surgery   . Breast lumpectomy 07/01/11    w/sentinal node bx - right side    Family History  Problem Relation Age of Onset  . Lymphoma Father   . Cancer Father   . Hodgkin's lymphoma Father   . Diabetes Maternal Aunt   . Diabetes Paternal Grandmother   . Cancer Mother     colon    Allergies  Allergen Reactions  .  Levofloxacin     REACTION: rash  . Sulfamethoxazole W-Trimethoprim Rash and Other (See Comments)    Chills, fever  . Sulfonamide Derivatives Rash and Other (See Comments)    Chills, fever    Current Outpatient Prescriptions on File Prior to Visit  Medication Sig Dispense Refill  . ALPRAZolam (XANAX) 0.25 MG tablet Take 1 tablet (0.25 mg total) by mouth 3 (three) times daily as needed for anxiety.  30 tablet  2  . anastrozole (ARIMIDEX) 1 MG tablet Take 1 tablet (1 mg total) by mouth daily.  90 tablet  3  . aspirin 81 MG tablet Take 81 mg by mouth daily.        Marland Kitchen atenolol (TENORMIN) 50 MG tablet Take 1 tablet (50 mg total) by mouth daily.  90 tablet  3  . cephALEXin (KEFLEX) 500 MG capsule Take 500 mg by mouth as needed. 1 after sex       . cholecalciferol (VITAMIN D) 1000 UNITS tablet Take 2,000 Units by mouth 2 (two) times daily.       . fish oil-omega-3 fatty acids 1000 MG capsule Take 2 capsules (2 g total) by mouth daily.      . simvastatin (ZOCOR) 20 MG tablet Take 1 tablet (20 mg total) by mouth daily.  90 tablet  3  . valsartan-hydrochlorothiazide (DIOVAN HCT) 320-25 MG per tablet Take 1 tablet by mouth daily.  90 tablet  3  . famotidine (PEPCID) 40 MG tablet Take 1 tablet (40 mg total) by mouth at bedtime.  90 tablet  3  . magnesium chloride (SLOW-MAG) 64 MG TBEC Take by mouth as needed.         BP 140/80  Pulse 76  Temp 98.6 F (37 C)  Resp 16  Ht 5\' 6"  (1.676 m)  Wt 186 lb (84.369 kg)  BMI 30.02 kg/m2         Assessment & Plan:  discussion of a gluten free diet Increased pain in thumb  Informed consent obtained and the patient's right thumb was prepped with betadine. Local anesthesia was obtained with topical spray. Then 40 mg of Depo-Medrol and 1/2 cc of lidocaine was injected into the joint space. The patient tolerated the procedure without complications. Post injection care discussed with patient.  monitoring of HTN stable

## 2012-08-27 ENCOUNTER — Encounter: Payer: Self-pay | Admitting: Internal Medicine

## 2012-08-27 ENCOUNTER — Ambulatory Visit (INDEPENDENT_AMBULATORY_CARE_PROVIDER_SITE_OTHER): Payer: Medicare Other | Admitting: Internal Medicine

## 2012-08-27 VITALS — BP 118/66 | HR 62 | Ht 65.0 in | Wt 188.0 lb

## 2012-08-27 DIAGNOSIS — K589 Irritable bowel syndrome without diarrhea: Secondary | ICD-10-CM | POA: Insufficient documentation

## 2012-08-27 HISTORY — DX: Irritable bowel syndrome, unspecified: K58.9

## 2012-08-27 MED ORDER — ALIGN PO CAPS: 1.0000 | ORAL_CAPSULE | Freq: Every day | ORAL | Status: DC

## 2012-08-27 NOTE — Patient Instructions (Addendum)
Increase your Metamucil to twice a day.  We have given you samples of Align. This puts good bacteria back into your colon. You should take 1 capsule by mouth once daily. If this works well for you, it can be purchased over the counter.  Take it for a month at a time, or chronically, if needed.  We have given you a handout on Irritable Bowel Syndrome.  Thank you for choosing me and [facility].  Iva Boop, M.D., [facility]

## 2012-08-27 NOTE — Progress Notes (Signed)
  Subjective:    Patient ID: Alexandra Calderon, female    DOB: 11-30-1940, 71 y.o.   MRN: [id number]  HPI Pleasant elderly white woman here with episodic bowel habit changes. Intermittent without clear triggers. Salad and lettuce is a severe trigger but not always. She may see mucous in the stool. About every 2 weeks she has multiple bowel movements that get progressively loose and then no stools for 20-3 days. Mild cramping with it. Not disabling. No bleeding. Does have incomplete defecation. Metamucil 1 tbsp daily. Has had help from probiotics in past. GI ROS otherwise negative. Has had problems for 25 years but wants to know what it is.  Medications, allergies, past medical history, past surgical history, family history and social history are reviewed and updated in the EMR.  Review of Systems As above    Objective:   Physical Exam General:  NAD Eyes:   anicteric Lungs:  clear Heart:  S1S2 no rubs, murmurs or gallops Abdomen:  soft and nontender, BS+   Data Reviewed:  2011 colonoscopy (no polyps)     Assessment & Plan:   1. Irritable bowel syndrome    Classic story for IBS. She uses Metamucil 1 tbsp daily and has tried probiotics in past - with some help.  1. Increase Metamucil to 1 tbsp bid 2. Align daily x 1 month then intermittent pulse use vs. Daily use 3. Reassurance, education, IBS handout 4. Follow-up me prn

## 2012-09-29 ENCOUNTER — Other Ambulatory Visit: Payer: Self-pay | Admitting: *Deleted

## 2012-09-29 ENCOUNTER — Other Ambulatory Visit: Payer: Self-pay | Admitting: Internal Medicine

## 2012-09-29 MED ORDER — CEPHALEXIN 500 MG PO CAPS: 500.0000 mg | ORAL_CAPSULE | ORAL | Status: DC | PRN

## 2012-09-29 NOTE — Telephone Encounter (Signed)
done

## 2012-09-29 NOTE — Telephone Encounter (Signed)
Pt would like to ask if you would give her a new script for cephALEXin 500mg .  Her MD that prescribes this has retired.  Pharm:  [facility] supply ok.

## 2012-10-07 ENCOUNTER — Other Ambulatory Visit (HOSPITAL_BASED_OUTPATIENT_CLINIC_OR_DEPARTMENT_OTHER): Payer: Medicare Other

## 2012-10-07 DIAGNOSIS — C50119 Malignant neoplasm of central portion of unspecified female breast: Secondary | ICD-10-CM

## 2012-10-07 DIAGNOSIS — C50919 Malignant neoplasm of unspecified site of unspecified female breast: Secondary | ICD-10-CM

## 2012-10-07 LAB — COMPREHENSIVE METABOLIC PANEL (CC13)
ALT: 19 U/L (ref 0–55)
AST: 15 U/L (ref 5–34)
Albumin: 3.5 g/dL (ref 3.5–5.0)
Alkaline Phosphatase: 129 U/L (ref 40–150)
BUN: 15 mg/dL (ref 7.0–26.0)
CO2: 30 meq/L — ABNORMAL HIGH (ref 22–29)
Calcium: 9.2 mg/dL (ref 8.4–10.4)
Chloride: 99 meq/L (ref 98–107)
Creatinine: 1 mg/dL (ref 0.6–1.1)
Glucose: 114 mg/dL — ABNORMAL HIGH (ref 70–99)
Potassium: 3.5 meq/L (ref 3.5–5.1)
Sodium: 144 meq/L (ref 136–145)
Total Bilirubin: 0.55 mg/dL (ref 0.20–1.20)
Total Protein: 7 g/dL (ref 6.4–8.3)

## 2012-10-07 LAB — CBC WITH DIFFERENTIAL/PLATELET
BASO%: 0.4 % (ref 0.0–2.0)
Basophils Absolute: 0 10*3/uL (ref 0.0–0.1)
EOS%: 1.5 % (ref 0.0–7.0)
Eosinophils Absolute: 0.1 10*3/uL (ref 0.0–0.5)
HCT: 39.1 % (ref 34.8–46.6)
HGB: 13.4 g/dL (ref 11.6–15.9)
LYMPH%: 24.2 % (ref 14.0–49.7)
MCH: 30.2 pg (ref 25.1–34.0)
MCHC: 34.4 g/dL (ref 31.5–36.0)
MCV: 87.8 fL (ref 79.5–101.0)
MONO#: 0.4 10*3/uL (ref 0.1–0.9)
MONO%: 8.1 % (ref 0.0–14.0)
NEUT#: 3.4 10*3/uL (ref 1.5–6.5)
NEUT%: 65.8 % (ref 38.4–76.8)
Platelets: 200 10*3/uL (ref 145–400)
RBC: 4.45 10*6/uL (ref 3.70–5.45)
RDW: 14.4 % (ref 11.2–14.5)
WBC: 5.2 10*3/uL (ref 3.9–10.3)
lymph#: 1.3 10*3/uL (ref 0.9–3.3)

## 2012-10-07 LAB — VITAMIN D 25 HYDROXY (VIT D DEFICIENCY, FRACTURES): Vit D, 25-Hydroxy: 70 ng/mL (ref 30–89)

## 2012-10-15 ENCOUNTER — Ambulatory Visit: Payer: Medicare Other | Admitting: Oncology

## 2012-10-25 ENCOUNTER — Telehealth: Payer: Self-pay | Admitting: Oncology

## 2012-10-25 NOTE — Telephone Encounter (Signed)
I called to schedule.  She will see Bobbe Medico Tomorrow.

## 2012-10-26 ENCOUNTER — Encounter: Payer: Self-pay | Admitting: Nurse Practitioner

## 2012-10-26 ENCOUNTER — Ambulatory Visit (HOSPITAL_BASED_OUTPATIENT_CLINIC_OR_DEPARTMENT_OTHER): Payer: Medicare Other | Admitting: Nurse Practitioner

## 2012-10-26 ENCOUNTER — Telehealth: Payer: Self-pay | Admitting: Oncology

## 2012-10-26 VITALS — BP 145/82 | HR 61 | Temp 97.9°F | Resp 20 | Ht 65.0 in | Wt 188.7 lb

## 2012-10-26 DIAGNOSIS — C50919 Malignant neoplasm of unspecified site of unspecified female breast: Secondary | ICD-10-CM

## 2012-10-26 DIAGNOSIS — Z170 Estrogen receptor positive status [ER+]: Secondary | ICD-10-CM

## 2012-10-26 NOTE — Telephone Encounter (Signed)
gv pt appt schedule for July.  

## 2012-10-26 NOTE — Progress Notes (Signed)
Hematology and Oncology Follow Up Visit  Alexandra Calderon [id number] [birth year] 72 y.o. 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 She has no complaints but is anxious in Gen. There've been no real changes in her medications.   Radiology: last mammogram 06/02/2012 RADIOLOGY REPORT*  Clinical Data: The patient underwent right lumpectomy and  radiation therapy for breast cancer in 2012.  DIGITAL DIAGNOSTIC BILATERAL MAMMOGRAM WITH CAD  Comparison: 06/02/2011  Findings: There are scattered fibroglandular densities. Right  lumpectomy changes are present. There is no suspicious dominant  mass, nonsurgical architectural distortion or calcification to  suggest malignancy.  Mammographic images were processed with CAD.  IMPRESSION:  No mammographic evidence of malignancy.  RECOMMENDATION:  Yearly diagnostic mammography is suggested.  BI-RADS CATEGORY 2: Benign finding(s).  Original Report Authenticated By: Daryl Eastern, M.D.   Medications: I have reviewed the patient's current medications. Current Outpatient Prescriptions on File Prior to Visit  Medication Sig Dispense Refill  . anastrozole (ARIMIDEX) 1 MG tablet Take 1 tablet (1 mg total) by mouth daily.  90 tablet  3  . aspirin 81 MG tablet Take 81 mg by mouth daily.        Marland Kitchen atenolol (TENORMIN) 50 MG tablet Take 1 tablet (50 mg total) by mouth daily.  90 tablet  3  . bifidobacterium infantis (ALIGN) capsule Take 1 capsule by mouth daily.  28 capsule  0  . cephALEXin (KEFLEX) 500 MG capsule Take 1 capsule (500 mg total) by mouth as needed. 1 after sex  90 capsule  1  . cholecalciferol (VITAMIN D) 1000 UNITS tablet Take 2,000 Units by mouth 2 (two) times daily.       . famotidine (PEPCID) 40 MG tablet Take 1 tablet (40 mg total) by mouth at  bedtime.  90 tablet  3  . fish oil-omega-3 fatty acids 1000 MG capsule Take 2 capsules (2 g total) by mouth daily.      . magnesium chloride (SLOW-MAG) 64 MG TBEC Take by mouth as needed.       . psyllium (METAMUCIL) 58.6 % packet Take 1 packet by mouth 2 (two) times daily.      . simvastatin (ZOCOR) 20 MG tablet Take 1 tablet (20 mg total) by mouth daily.  90 tablet  3  . valsartan-hydrochlorothiazide (DIOVAN HCT) 320-25 MG per tablet Take 1 tablet by mouth daily.  90 tablet  3    Allergies:  Allergies  Allergen Reactions  . Levofloxacin     REACTION: rash  . Sulfamethoxazole W-Trimethoprim Rash and Other (See Comments)    Chills, fever  . Sulfonamide Derivatives Rash and Other (See Comments)    Chills, fever    Past Medical History, Surgical history, Social history, and Family History were reviewed and updated.  Review of Systems: Constitutional:  Negative for fever, chills, night sweats, anorexia, weight loss, pain. Cardiovascular: no chest pain or dyspnea on exertion Respiratory: no cough, wheezing or shortness of breath Neurological: no dizziness blurred vision, loss of balance or hallucinations Gastrointestinal: no constipation, nausea, vomiting, diarrhea  Genito-Urinary: no blood in urine or vaginal bleeding Hematological and Lymphatic: negative Breast: negative Musculoskeletal: negative Remaining ROS negative.  Physical Exam:  Blood pressure 145/82, pulse 61, temperature 97.9 F (36.6 C), resp. rate 20, height 5\' 5"  (1.651 m), weight  188 lb 11.2 oz (85.594 kg).  ECOG: 0   HEENT:  Sclerae anicteric, conjunctivae pink.  Oropharynx clear.  No mucositis or candidiasis.   Nodes:  No cervical, supraclavicular, or axillary lymphadenopathy palpated.   Breast Exam:  Right breast is status post lumpectomy. There is a well-healed scar on the nipple areola complex with some firmness and slight degree of induration likely representing residual seroma.   Left breast is benign.   No masses, discharge, skin change, or nipple inversion..   Lungs:  Clear to auscultation bilaterally.  No crackles, rhonchi, or wheezes.  Heart:  Regular rate and rhythm.   Abdomen:  Soft, nontender.  Positive bowel sounds.  No organomegaly or masses palpated.   Musculoskeletal:  No focal spinal tenderness to palpation.   Extremities:  Benign.  No peripheral edema or cyanosis.   Skin:  Benign.   Neuro:  Nonfocal.    Lab Results: Lab Results  Component Value Date   WBC 5.2 10/07/2012   HGB 13.4 10/07/2012   HCT 39.1 10/07/2012   MCV 87.8 10/07/2012   PLT 200 10/07/2012     Chemistry      Component Value Date/Time   NA 144 10/07/2012 0859   NA 141 05/06/2012 1028   K 3.5 10/07/2012 0859   K 3.6 05/06/2012 1028   CL 99 10/07/2012 0859   CL 103 05/06/2012 1028   CO2 30* 10/07/2012 0859   CO2 31 05/06/2012 1028   BUN 15.0 10/07/2012 0859   BUN 14 05/06/2012 1028   CREATININE 1.0 10/07/2012 0859   CREATININE 0.89 05/06/2012 1028      Component Value Date/Time   CALCIUM 9.2 10/07/2012 0859   CALCIUM 9.1 05/06/2012 1028   ALKPHOS 129 10/07/2012 0859   ALKPHOS 94 05/06/2012 1028   AST 15 10/07/2012 0859   AST 15 05/06/2012 1028   ALT 19 10/07/2012 0859   ALT 14 05/06/2012 1028   BILITOT 0.55 10/07/2012 0859   BILITOT 0.5 05/06/2012 1028        IMPRESSIONS AND PLAN: A 72 y.o. female with history of ER/PR positive breast cancer on Arimidex, being tolerated well. She will follow up in 6 months time Most recent mammogram in August was unremarkable. No evidence of recurrence. She was encouraged to remain active and to continue her efforts to lose weight. She is exercising.  All of her questions were answered to her satisfaction.   Spent more than half the time coordinating care, as well as discussion of BMI and its implications.      Jobie Quaker, NP-C 1/14/20144:50 PM

## 2012-10-27 ENCOUNTER — Other Ambulatory Visit: Payer: Self-pay | Admitting: *Deleted

## 2012-10-27 DIAGNOSIS — I10 Essential (primary) hypertension: Secondary | ICD-10-CM

## 2012-10-27 MED ORDER — VALSARTAN-HYDROCHLOROTHIAZIDE 320-25 MG PO TABS: 1.0000 | ORAL_TABLET | Freq: Every day | ORAL | Status: DC

## 2012-11-11 NOTE — Progress Notes (Signed)
Hematology and Oncology Follow Up Visit  Alexandra Calderon [id number] 03/16/1941 72 y.o. 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 She has no complaints but is anxious in Gen. There've been no real changes in her medications. She is due for followup mammogram in this month.  Medications: I have reviewed the patient's current medications.  Allergies:  Allergies  Allergen Reactions  . Levofloxacin     REACTION: rash  . Sulfamethoxazole W-Trimethoprim Rash and Other (See Comments)    Chills, fever  . Sulfonamide Derivatives Rash and Other (See Comments)    Chills, fever    Past Medical History, Surgical history, Social history, and Family History were reviewed and updated.  Review of Systems: Constitutional:  Negative for fever, chills, night sweats, anorexia, weight loss, pain. Cardiovascular: no chest pain or dyspnea on exertion Respiratory: negative Neurological: negative Dermatological: negative ENT: negative Skin Gastrointestinal: negative Genito-Urinary: negative Hematological and Lymphatic: negative Breast: negative Musculoskeletal: negative Remaining ROS negative.  Physical Exam:  Blood pressure 135/75, pulse 71, temperature 98.4 F (36.9 C), height 5\' 6"  (1.676 m), weight 185 lb 6.4 oz (84.097 kg).  ECOG: 0   HEENT:  Sclerae anicteric, conjunctivae pink.  Oropharynx clear.  No mucositis or candidiasis.  Nodes:  No cervical, supraclavicular, or axillary lymphadenopathy palpated.  Breast Exam:  Right breast is status post lumpectomy. There is a well-healed scar on the nipple areola complex with some firmness and slight degree of induration likely representing residual seroma.   Left breast is benign.  No masses, discharge, skin change, or nipple inversion..  Lungs:  Clear to  auscultation bilaterally.  No crackles, rhonchi, or wheezes.  Heart:  Regular rate and rhythm.  Abdomen:  Soft, nontender.  Positive bowel sounds.  No organomegaly or masses palpated.  Musculoskeletal:  No focal spinal tenderness to palpation.  Extremities:  Benign.  No peripheral edema or cyanosis.  Skin:  Benign.  Neuro:  Nonfocal.    Lab Results: Lab Results  Component Value Date   WBC 5.2 10/07/2012   HGB 13.4 10/07/2012   HCT 39.1 10/07/2012   MCV 87.8 10/07/2012   PLT 200 10/07/2012     Chemistry      Component Value Date/Time   NA 144 10/07/2012 0859   NA 141 05/06/2012 1028   K 3.5 10/07/2012 0859   K 3.6 05/06/2012 1028   CL 99 10/07/2012 0859   CL 103 05/06/2012 1028   CO2 30* 10/07/2012 0859   CO2 31 05/06/2012 1028   BUN 15.0 10/07/2012 0859   BUN 14 05/06/2012 1028   CREATININE 1.0 10/07/2012 0859   CREATININE 0.89 05/06/2012 1028      Component Value Date/Time   CALCIUM 9.2 10/07/2012 0859   CALCIUM 9.1 05/06/2012 1028   ALKPHOS 129 10/07/2012 0859   ALKPHOS 94 05/06/2012 1028   AST 15 10/07/2012 0859   AST 15 05/06/2012 1028   ALT 19 10/07/2012 0859   ALT 14 05/06/2012 1028   BILITOT 0.55 10/07/2012 0859   BILITOT 0.5 05/06/2012 1028       Radiological Studies:  No results found.   IMPRESSIONS AND PLAN: A 72 y.o. female with   History of ER/PR positive breast cancer on Arimidex, being tolerated well. I will see her in 6 months time.  Spent more  than half the time coordinating care, as well as discussion of BMI and its implications.      Lylie Blacklock Cell [phone]

## 2012-11-18 ENCOUNTER — Telehealth: Payer: Self-pay | Admitting: Internal Medicine

## 2012-11-18 MED ORDER — ATENOLOL 50 MG PO TABS: 50.0000 mg | ORAL_TABLET | Freq: Every day | ORAL | Status: DC

## 2012-11-18 NOTE — Telephone Encounter (Signed)
sent 

## 2012-11-18 NOTE — Telephone Encounter (Signed)
Patient called [facility] to get refill on atenolol (TENORMIN) 50 MG tablet, but they said they could not do it. Please send rx to [facility] mail order pharmacy.

## 2012-12-13 ENCOUNTER — Encounter: Payer: Self-pay | Admitting: Internal Medicine

## 2012-12-13 ENCOUNTER — Ambulatory Visit (INDEPENDENT_AMBULATORY_CARE_PROVIDER_SITE_OTHER): Payer: Medicare Other | Admitting: Internal Medicine

## 2012-12-13 VITALS — BP 124/70 | HR 72 | Temp 98.3°F | Resp 16 | Ht 65.0 in | Wt 190.0 lb

## 2012-12-13 DIAGNOSIS — R739 Hyperglycemia, unspecified: Secondary | ICD-10-CM

## 2012-12-13 DIAGNOSIS — R7309 Other abnormal glucose: Secondary | ICD-10-CM

## 2012-12-13 DIAGNOSIS — R252 Cramp and spasm: Secondary | ICD-10-CM

## 2012-12-13 DIAGNOSIS — E785 Hyperlipidemia, unspecified: Secondary | ICD-10-CM

## 2012-12-13 LAB — HEMOGLOBIN A1C: Hgb A1c MFr Bld: 6 % (ref 4.6–6.5)

## 2012-12-13 LAB — LIPID PANEL
Cholesterol: 145 mg/dL (ref 0–200)
HDL: 35.3 mg/dL — ABNORMAL LOW
LDL Cholesterol: 75 mg/dL (ref 0–99)
Total CHOL/HDL Ratio: 4
Triglycerides: 173 mg/dL — ABNORMAL HIGH (ref 0.0–149.0)
VLDL: 34.6 mg/dL (ref 0.0–40.0)

## 2012-12-13 NOTE — Patient Instructions (Signed)
The patient is instructed to continue all medications as prescribed. Schedule followup with check out clerk upon leaving the clinic  

## 2012-12-13 NOTE — Progress Notes (Signed)
Subjective:    Patient ID: Alexandra Calderon, female    DOB: 1941-06-14, 72 y.o.   MRN: [id number]  HPI Weight issues dicussed Arimidex side effects note Cutting sugars as a strategy discussed    Review of Systems  Constitutional: Negative for activity change, appetite change and fatigue.  HENT: Negative for ear pain, congestion, neck pain, postnasal drip and sinus pressure.   Eyes: Negative for redness and visual disturbance.  Respiratory: Negative for cough, shortness of breath and wheezing.   Gastrointestinal: Negative for abdominal pain and abdominal distention.  Genitourinary: Negative for dysuria, frequency and menstrual problem.  Musculoskeletal: Negative for myalgias, joint swelling and arthralgias.  Skin: Negative for rash and wound.  Neurological: Negative for dizziness, weakness and headaches.  Hematological: Negative for adenopathy. Does not bruise/bleed easily.  Psychiatric/Behavioral: Negative for sleep disturbance and decreased concentration.   Past Medical History  Diagnosis Date  . Hypertension   . Osteopenia   . PVC (premature ventricular contraction)   . Hyperlipidemia   . Arthritis   . GERD (gastroesophageal reflux disease)   . Diverticulosis   . Kidney stones   . Pneumonia   . UTI (lower urinary tract infection)   . Allergy   . Hearing loss   . Palpitations   . Breast cancer   . 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 Esophagitis   . Polyp, stomach   . Irritable bowel syndrome 08/27/2012    History   Social History  . Marital Status: Married    Spouse Name: N/A    Number of Children: 2  . Years of Education: N/A   Occupational History  . retired    Social History Main Topics  . Smoking status: Never Smoker   . Smokeless tobacco: Never Used  . Alcohol Use: No  . Drug Use: No  . Sexually Active: Yes   Other Topics Concern  . Not on file   Social History Narrative   Spouse:  [name]   Children:   [name]- age 72- [place]   Savita Runner-  age 18- [place]    Past Surgical History  Procedure Laterality Date  . Kidney stones    . Dilation and curettage of uterus    . Shoulder arthroscopy rt 2009    . Shoulder surgery    . Breast lumpectomy  07/01/11    w/sentinal node bx - right side  . Colonoscopy w/ biopsies    . Esophagogastroduodenoscopy      Family History  Problem Relation Age of Onset  . Lymphoma Father   . Cancer Father   . Hodgkin's lymphoma Father   . Diabetes Maternal Aunt   . Diabetes Paternal Grandmother   . Colon cancer Mother     Allergies  Allergen Reactions  . Levofloxacin     REACTION: rash  . Sulfamethoxazole W-Trimethoprim Rash and Other (See Comments)    Chills, fever  . Sulfonamide Derivatives Rash and Other (See Comments)    Chills, fever    Current Outpatient Prescriptions on File Prior to Visit  Medication Sig Dispense Refill  . anastrozole (ARIMIDEX) 1 MG tablet Take 1 tablet (1 mg total) by mouth daily.  90 tablet  3  . aspirin 81 MG tablet Take 81 mg by mouth daily.        . bifidobacterium infantis (ALIGN) capsule Take 1 capsule by mouth daily.  28 capsule  0  . cephALEXin (KEFLEX) 500 MG capsule Take 1 capsule (500 mg total) by mouth as  needed. 1 after sex  90 capsule  1  . cholecalciferol (VITAMIN D) 1000 UNITS tablet Take 2,000 Units by mouth 2 (two) times daily.       . famotidine (PEPCID) 40 MG tablet Take 1 tablet (40 mg total) by mouth at bedtime.  90 tablet  3  . fish oil-omega-3 fatty acids 1000 MG capsule Take 2 capsules (2 g total) by mouth daily.      . magnesium chloride (SLOW-MAG) 64 MG TBEC Take by mouth as needed.       . psyllium (METAMUCIL) 58.6 % packet Take 1 packet by mouth 2 (two) times daily.      . simvastatin (ZOCOR) 20 MG tablet Take 1 tablet (20 mg total) by mouth daily.  90 tablet  3  . valsartan-hydrochlorothiazide (DIOVAN HCT) 320-25 MG per tablet Take 1 tablet by mouth daily.  90 tablet  3  . atenolol (TENORMIN) 50 MG tablet Take 1 tablet (50 mg  total) by mouth daily.  90 tablet  3   No current facility-administered medications on file prior to visit.    BP 124/70  Pulse 72  Temp(Src) 98.3 F (36.8 C)  Resp 16  Ht 5\' 5"  (1.651 m)  Wt 190 lb (86.183 kg)  BMI 31.62 kg/m2     Objective:   Physical Exam  Vitals reviewed. Constitutional: She is oriented to person, place, and time. She appears well-developed and well-nourished. No distress.  HENT:  Head: Normocephalic and atraumatic.  Right Ear: External ear normal.  Left Ear: External ear normal.  Nose: Nose normal.  Mouth/Throat: Oropharynx is clear and moist.  Eyes: Conjunctivae and EOM are normal. Pupils are equal, round, and reactive to light.  Neck: Normal range of motion. Neck supple. No JVD present. No tracheal deviation present. No thyromegaly present.  Cardiovascular: Normal rate, regular rhythm, normal heart sounds and intact distal pulses.   No murmur heard. Pulmonary/Chest: Effort normal and breath sounds normal. She has no wheezes. She exhibits no tenderness.  Abdominal: Soft. Bowel sounds are normal.  Musculoskeletal: Normal range of motion. She exhibits no edema and no tenderness.  Lymphadenopathy:    She has no cervical adenopathy.  Neurological: She is alert and oriented to person, place, and time. She has normal reflexes. No cranial nerve deficit.  Skin: Skin is warm and dry. She is not diaphoretic.  Psychiatric: She has a normal mood and affect. Her behavior is normal.          Assessment & Plan:  Reviewed exercise Stretch at end of exercise Reviewed diet Cramps in the legs Magnesium increased to 2 a day reveiwed lipids and a1c screening

## 2012-12-24 ENCOUNTER — Encounter: Payer: Self-pay | Admitting: Internal Medicine

## 2012-12-29 ENCOUNTER — Encounter: Payer: Self-pay | Admitting: Internal Medicine

## 2012-12-30 ENCOUNTER — Other Ambulatory Visit: Payer: Self-pay | Admitting: *Deleted

## 2013-03-25 ENCOUNTER — Ambulatory Visit (INDEPENDENT_AMBULATORY_CARE_PROVIDER_SITE_OTHER): Payer: Medicare Other | Admitting: Family

## 2013-03-25 ENCOUNTER — Other Ambulatory Visit: Payer: Self-pay | Admitting: Family

## 2013-03-25 ENCOUNTER — Encounter: Payer: Self-pay | Admitting: Family

## 2013-03-25 ENCOUNTER — Telehealth: Payer: Self-pay | Admitting: Internal Medicine

## 2013-03-25 VITALS — BP 108/70 | HR 60 | Wt 187.0 lb

## 2013-03-25 DIAGNOSIS — I10 Essential (primary) hypertension: Secondary | ICD-10-CM

## 2013-03-25 DIAGNOSIS — E785 Hyperlipidemia, unspecified: Secondary | ICD-10-CM

## 2013-03-25 DIAGNOSIS — R600 Localized edema: Secondary | ICD-10-CM

## 2013-03-25 DIAGNOSIS — R609 Edema, unspecified: Secondary | ICD-10-CM

## 2013-03-25 LAB — BASIC METABOLIC PANEL WITH GFR
BUN: 15 mg/dL (ref 6–23)
CO2: 29 meq/L (ref 19–32)
Calcium: 9.2 mg/dL (ref 8.4–10.5)
Chloride: 103 meq/L (ref 96–112)
Creatinine, Ser: 0.8 mg/dL (ref 0.4–1.2)
GFR: 72.2 mL/min
Glucose, Bld: 84 mg/dL (ref 70–99)
Potassium: 3.4 meq/L — ABNORMAL LOW (ref 3.5–5.1)
Sodium: 142 meq/L (ref 135–145)

## 2013-03-25 LAB — BRAIN NATRIURETIC PEPTIDE: Pro B Natriuretic peptide (BNP): 121 pg/mL — ABNORMAL HIGH (ref 0.0–100.0)

## 2013-03-25 LAB — TSH: TSH: 3.23 u[IU]/mL (ref 0.35–5.50)

## 2013-03-25 MED ORDER — POTASSIUM CHLORIDE CRYS ER 20 MEQ PO TBCR: 20.0000 meq | EXTENDED_RELEASE_TABLET | Freq: Every day | ORAL | Status: DC

## 2013-03-25 MED ORDER — FUROSEMIDE 20 MG PO TABS: 20.0000 mg | ORAL_TABLET | Freq: Every day | ORAL | Status: DC

## 2013-03-25 NOTE — Telephone Encounter (Signed)
Will see padonda today

## 2013-03-25 NOTE — Progress Notes (Signed)
Subjective:    Patient ID: Alexandra Calderon, female    DOB: [birth year], 72 y.o.   MRN: [id number]  HPI 72 year old white female, nonsmoker, patient of Dr. Lovell Sheehan is in today with complaints of worsening right lower extremity edema ongoing x1 month. She reports eating more sodium recently but has not had any salt to her food. Denies any concerns of urination. No shortness of breath chest pain or palpitations.   Review of Systems  Constitutional: Negative.   HENT: Negative.   Respiratory: Negative.   Cardiovascular: Positive for leg swelling. Negative for chest pain and palpitations.  Gastrointestinal: Negative.   Endocrine: Negative.   Genitourinary: Negative.   Musculoskeletal: Negative.   Skin: Negative.   Neurological: Negative.   Hematological: Negative.   Psychiatric/Behavioral: Negative.    Past Medical History  Diagnosis Date  . Hypertension   . Osteopenia   . PVC (premature ventricular contraction)   . Hyperlipidemia   . Arthritis   . GERD (gastroesophageal reflux disease)   . Diverticulosis   . Kidney stones   . Pneumonia   . UTI (lower urinary tract infection)   . Allergy   . Hearing loss   . Palpitations   . Breast cancer   . 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 Esophagitis   . Polyp, stomach   . Irritable bowel syndrome 08/27/2012    History   Social History  . Marital Status: Married    Spouse Name: N/A    Number of Children: 2  . Years of Education: N/A   Occupational History  . retired    Social History Main Topics  . Smoking status: Never Smoker   . Smokeless tobacco: Never Used  . Alcohol Use: No  . Drug Use: No  . Sexually Active: Yes   Other Topics Concern  . Not on file   Social History Narrative   Spouse:  [name]   Children:   [name]- age 3- [place]   Hina Gupta- age 22- [place]    Past Surgical History  Procedure Laterality Date  . Kidney stones    . Dilation and curettage of uterus    . Shoulder arthroscopy rt 2009     . Shoulder surgery    . Breast lumpectomy  07/01/11    w/sentinal node bx - right side  . Colonoscopy w/ biopsies    . Esophagogastroduodenoscopy      Family History  Problem Relation Age of Onset  . Lymphoma Father   . Cancer Father   . Hodgkin's lymphoma Father   . Diabetes Maternal Aunt   . Diabetes Paternal Grandmother   . Colon cancer Mother     Allergies  Allergen Reactions  . Levofloxacin     REACTION: rash  . Sulfamethoxazole W-Trimethoprim Rash and Other (See Comments)    Chills, fever  . Sulfonamide Derivatives Rash and Other (See Comments)    Chills, fever    Current Outpatient Prescriptions on File Prior to Visit  Medication Sig Dispense Refill  . anastrozole (ARIMIDEX) 1 MG tablet Take 1 tablet (1 mg total) by mouth daily.  90 tablet  3  . aspirin 81 MG tablet Take 81 mg by mouth daily.        Marland Kitchen atenolol (TENORMIN) 50 MG tablet Take 1 tablet (50 mg total) by mouth daily.  90 tablet  3  . bifidobacterium infantis (ALIGN) capsule Take 1 capsule by mouth daily.  28 capsule  0  . cephALEXin (KEFLEX) 500 MG capsule  Take 1 capsule (500 mg total) by mouth as needed. 1 after sex  90 capsule  1  . cholecalciferol (VITAMIN D) 1000 UNITS tablet Take 2,000 Units by mouth 2 (two) times daily.       . famotidine (PEPCID) 40 MG tablet Take 1 tablet (40 mg total) by mouth at bedtime.  90 tablet  3  . fish oil-omega-3 fatty acids 1000 MG capsule Take 2 capsules (2 g total) by mouth daily.      . magnesium chloride (SLOW-MAG) 64 MG TBEC Take by mouth as needed.       . psyllium (METAMUCIL) 58.6 % packet Take 1 packet by mouth 2 (two) times daily.      . simvastatin (ZOCOR) 20 MG tablet Take 1 tablet (20 mg total) by mouth daily.  90 tablet  3  . valsartan-hydrochlorothiazide (DIOVAN HCT) 320-25 MG per tablet Take 1 tablet by mouth daily.  90 tablet  3   No current facility-administered medications on file prior to visit.    BP 108/70  Pulse 60  Wt 187 lb (84.823 kg)  BMI  31.12 kg/m2  SpO2 96%chart    Objective:   Physical Exam  Constitutional: She is oriented to person, place, and time. She appears well-developed and well-nourished.  Neck: Normal range of motion. Neck supple.  Cardiovascular: Normal rate, regular rhythm and normal heart sounds.   Pulmonary/Chest: Effort normal and breath sounds normal.  Abdominal: Soft. Bowel sounds are normal.  Musculoskeletal: She exhibits edema.  1+ pitting edema noted to the right lower extremities. Very mild edema noted to the left lower extremity. No redness  Neurological: She is alert and oriented to person, place, and time.  Skin: Skin is warm and dry.  Psychiatric: She has a normal mood and affect.          Assessment & Plan:  Assessment:  1. Peripheral edema likely related to sodium retention 2. Hypertension 3. Hyperlipidemia  Plan: Lab sent to include BMP, TSH, urine point of care will notify patient pending results. Lasix 20 mg once daily. Encouraged a low sodium diet. Elevate the legs in the evenings. Consider support hose

## 2013-03-25 NOTE — Telephone Encounter (Signed)
Pt has appt for 7/24. However, top of foot is swelling and she cannot wait. She wants to see Dr Lovell Sheehan today. Please advise.

## 2013-03-25 NOTE — Patient Instructions (Addendum)
Peripheral Edema You have swelling in your legs (peripheral edema). This swelling is due to excess accumulation of salt and water in your body. Edema may be a sign of heart, kidney or liver disease, or a side effect of a medication. It may also be due to problems in the leg veins. Elevating your legs and using special support stockings may be very helpful, if the cause of the swelling is due to poor venous circulation. Avoid long periods of standing, whatever the cause. Treatment of edema depends on identifying the cause. Chips, pretzels, pickles and other salty foods should be avoided. Restricting salt in your diet is almost always needed. Water pills (diuretics) are often used to remove the excess salt and water from your body via urine. These medicines prevent the kidney from reabsorbing sodium. This increases urine flow. Diuretic treatment may also result in lowering of potassium levels in your body. Potassium supplements may be needed if you have to use diuretics daily. Daily weights can help you keep track of your progress in clearing your edema. You should call your caregiver for follow up care as recommended. SEEK IMMEDIATE MEDICAL CARE IF:   You have increased swelling, pain, redness, or heat in your legs.  You develop shortness of breath, especially when lying down.  You develop chest or abdominal pain, weakness, or fainting.  You have a fever. Document Released: 11/06/2004 Document Revised: 12/22/2011 Document Reviewed: 10/17/2009 Exit[employer] Patient Information 2014 Exit[employer], [place].   1.5 Gram Low Sodium Diet A 1.5 gram sodium diet restricts the amount of sodium in the diet to no more than 1.5 g or 1500 mg daily. The American Heart Association recommends Americans over the age of 22 to consume no more than 1500 mg of sodium each day to reduce the risk of developing high blood pressure. Research also shows that limiting sodium may reduce heart attack and stroke risk. Many foods contain  sodium for flavor and sometimes as a preservative. When the amount of sodium in a diet needs to be low, it is important to know what to look for when choosing foods and drinks. The following includes some information and guidelines to help make it easier for you to adapt to a low sodium diet. QUICK TIPS  Do not add salt to food.  Avoid convenience items and fast food.  Choose unsalted snack foods.  Buy lower sodium products, often labeled as "lower sodium" or "no salt added."  Check food labels to learn how much sodium is in 1 serving.  When eating at a restaurant, ask that your food be prepared with less salt or none, if possible. READING FOOD LABELS FOR SODIUM INFORMATION The nutrition facts label is a good place to find how much sodium is in foods. Look for products with no more than 400 mg of sodium per serving. Remember that 1.5 g = 1500 mg. The food label may also list foods as:  Sodium-free: Less than 5 mg in a serving.  Very low sodium: 35 mg or less in a serving.  Low-sodium: 140 mg or less in a serving.  Light in sodium: 50% less sodium in a serving. For example, if a food that usually has 300 mg of sodium is changed to become light in sodium, it will have 150 mg of sodium.  Reduced sodium: 25% less sodium in a serving. For example, if a food that usually has 400 mg of sodium is changed to reduced sodium, it will have 300 mg of sodium. CHOOSING FOODS  Grains  Avoid: Salted crackers and snack items. Some cereals, including instant hot cereals. Bread stuffing and biscuit mixes. Seasoned rice or pasta mixes.  Choose: Unsalted snack items. Low-sodium cereals, oats, puffed wheat and rice, shredded wheat. English muffins and bread. Pasta. Meats  Avoid: Salted, canned, smoked, spiced, pickled meats, including fish and poultry. Bacon, ham, sausage, cold cuts, hot dogs, anchovies.  Choose: Low-sodium canned tuna and salmon. Fresh or frozen meat, poultry, and  fish. Dairy  Avoid: Processed cheese and spreads. Cottage cheese. Buttermilk and condensed milk. Regular cheese.  Choose: Milk. Low-sodium cottage cheese. Yogurt. Sour cream. Low-sodium cheese. Fruits and Vegetables  Avoid: Regular canned vegetables. Regular canned tomato sauce and paste. Frozen vegetables in sauces. Olives. Rosita Fire. Relishes. Sauerkraut.  Choose: Low-sodium canned vegetables. Low-sodium tomato sauce and paste. Frozen or fresh vegetables. Fresh and frozen fruit. Condiments  Avoid: Canned and packaged gravies. Worcestershire sauce. Tartar sauce. Barbecue sauce. Soy sauce. Steak sauce. Ketchup. Onion, garlic, and table salt. Meat flavorings and tenderizers.  Choose: Fresh and dried herbs and spices. Low-sodium varieties of mustard and ketchup. Lemon juice. Tabasco sauce. Horseradish. SAMPLE 1.5 GRAM SODIUM MEAL PLAN Breakfast / Sodium (mg)  1 cup low-fat milk / 143 mg  1 whole-wheat English muffin / 240 mg  1 tbs heart-healthy margarine / 153 mg  1 hard-boiled egg / 139 mg  1 small orange / 0 mg Lunch / Sodium (mg)  1 cup raw carrots / 76 mg  2 tbs no salt added peanut butter / 5 mg  2 slices whole-wheat bread / 270 mg  1 tbs jelly / 6 mg   cup red grapes / 2 mg Dinner / Sodium (mg)  1 cup whole-wheat pasta / 2 mg  1 cup low-sodium tomato sauce / 73 mg  3 oz lean ground beef / 57 mg  1 small side salad (1 cup raw spinach leaves,  cup cucumber,  cup yellow bell pepper) with 1 tsp olive oil and 1 tsp red wine vinegar / 25 mg Snack / Sodium (mg)  1 container low-fat vanilla yogurt / 107 mg  3 graham cracker squares / 127 mg Nutrient Analysis  Calories: 1745  Protein: 75 g  Carbohydrate: 237 g  Fat: 57 g  Sodium: 1425 mg Document Released: 09/29/2005 Document Revised: 12/22/2011 Document Reviewed: 12/31/2009 Exit[employer] Patient Information 2014 [place], [place].

## 2013-04-01 ENCOUNTER — Ambulatory Visit: Payer: Medicare Other | Admitting: Family

## 2013-04-04 ENCOUNTER — Encounter: Payer: Self-pay | Admitting: Internal Medicine

## 2013-04-06 ENCOUNTER — Encounter: Payer: Self-pay | Admitting: Internal Medicine

## 2013-04-13 ENCOUNTER — Ambulatory Visit: Payer: Medicare Other | Admitting: Internal Medicine

## 2013-04-18 ENCOUNTER — Other Ambulatory Visit (HOSPITAL_BASED_OUTPATIENT_CLINIC_OR_DEPARTMENT_OTHER): Payer: Medicare Other

## 2013-04-18 DIAGNOSIS — C50119 Malignant neoplasm of central portion of unspecified female breast: Secondary | ICD-10-CM

## 2013-04-18 DIAGNOSIS — C50919 Malignant neoplasm of unspecified site of unspecified female breast: Secondary | ICD-10-CM

## 2013-04-18 LAB — COMPREHENSIVE METABOLIC PANEL (CC13)
ALT: 16 U/L (ref 0–55)
AST: 17 U/L (ref 5–34)
Albumin: 3.3 g/dL — ABNORMAL LOW (ref 3.5–5.0)
Alkaline Phosphatase: 93 U/L (ref 40–150)
BUN: 15.3 mg/dL (ref 7.0–26.0)
CO2: 31 meq/L — ABNORMAL HIGH (ref 22–29)
Calcium: 9.3 mg/dL (ref 8.4–10.4)
Chloride: 104 meq/L (ref 98–109)
Creatinine: 1 mg/dL (ref 0.6–1.1)
Glucose: 109 mg/dL (ref 70–140)
Potassium: 3.5 meq/L (ref 3.5–5.1)
Sodium: 144 meq/L (ref 136–145)
Total Bilirubin: 0.57 mg/dL (ref 0.20–1.20)
Total Protein: 6.8 g/dL (ref 6.4–8.3)

## 2013-04-18 LAB — CBC WITH DIFFERENTIAL/PLATELET
BASO%: 0.6 % (ref 0.0–2.0)
Basophils Absolute: 0 10*3/uL (ref 0.0–0.1)
EOS%: 1.9 % (ref 0.0–7.0)
Eosinophils Absolute: 0.1 10*3/uL (ref 0.0–0.5)
HCT: 36.3 % (ref 34.8–46.6)
HGB: 12.5 g/dL (ref 11.6–15.9)
LYMPH%: 25.6 % (ref 14.0–49.7)
MCH: 29.8 pg (ref 25.1–34.0)
MCHC: 34.5 g/dL (ref 31.5–36.0)
MCV: 86.5 fL (ref 79.5–101.0)
MONO#: 0.5 10*3/uL (ref 0.1–0.9)
MONO%: 9.6 % (ref 0.0–14.0)
NEUT#: 3.2 10*3/uL (ref 1.5–6.5)
NEUT%: 62.3 % (ref 38.4–76.8)
Platelets: 186 10*3/uL (ref 145–400)
RBC: 4.2 10*6/uL (ref 3.70–5.45)
RDW: 14.1 % (ref 11.2–14.5)
WBC: 5.1 10*3/uL (ref 3.9–10.3)
lymph#: 1.3 10*3/uL (ref 0.9–3.3)

## 2013-04-18 LAB — CANCER ANTIGEN 27.29: CA 27.29: 17 U/mL (ref 0–39)

## 2013-04-25 ENCOUNTER — Encounter: Payer: Self-pay | Admitting: Oncology

## 2013-04-25 ENCOUNTER — Ambulatory Visit (HOSPITAL_BASED_OUTPATIENT_CLINIC_OR_DEPARTMENT_OTHER): Payer: Medicare Other | Admitting: Oncology

## 2013-04-25 ENCOUNTER — Telehealth: Payer: Self-pay | Admitting: *Deleted

## 2013-04-25 VITALS — BP 132/69 | HR 66 | Temp 98.4°F | Resp 18 | Ht 65.0 in | Wt 186.2 lb

## 2013-04-25 DIAGNOSIS — C50919 Malignant neoplasm of unspecified site of unspecified female breast: Secondary | ICD-10-CM

## 2013-04-25 DIAGNOSIS — C50911 Malignant neoplasm of unspecified site of right female breast: Secondary | ICD-10-CM

## 2013-04-25 NOTE — Progress Notes (Signed)
OFFICE PROGRESS NOTE  CC**  Alexandra Mew, MD [address] [place] [address] Dr. Chipper Herb Dr. 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  PRIOR THERAPY:  #1 patient originally presentedin August 2012 for a screening mammogram and she was noted to have a possible mass in the right breast. She therefore had diagnostic procedures performed including a biopsy. The biopsy was performed on 06/11/2011 that revealed in the right breast at the 12:00 position invasive mammary carcinoma.  #2 patient then went on to have a lumpectomy with sentinel lymph node biopsy on 07/01/2011. The pathology revealed a 2.2 cm invasive ductal carcinoma that was grade 1. One node had microscopic disease. Tumor was ER positive PR positive HER-2/neu negative with a Ki-67 of 16%  #3 patient then went on to have adjuvant radiation therapy by Dr. Chipper Herb. She completed this on 09/25/2011.  #4 patient was then begun on arimidex 1 milligram daily. A total of 5 years of therapy is planned  CURRENT THERAPY:arimidex 1 mg daily  INTERVAL HISTORY: Alexandra Calderon 72 y.o. female returns for followup visit and to establish care with me. Overall she's doing well she is tolerating her Arimidex without any significant problems. She denies any fevers chills night sweats headaches shortness of breath chest pains palpitations no myalgias and arthralgias no peripheral paresthesias. Remainder of the 10 point review of systems is negative.  MEDICAL HISTORY: Past Medical History  Diagnosis Date  . Hypertension   . Osteopenia   . PVC (premature ventricular contraction)   . Hyperlipidemia   . Arthritis   . GERD (gastroesophageal reflux disease)   . Diverticulosis   . Kidney stones   . Pneumonia   . UTI (lower urinary tract infection)   . Allergy   . Hearing loss   . Palpitations   .  Breast cancer   . Colon adenomas 6045,4098  . Esophagitis   . Polyp, stomach   . Irritable bowel syndrome 08/27/2012    ALLERGIES:  is allergic to levofloxacin; sulfamethoxazole w-trimethoprim; and sulfonamide derivatives.  MEDICATIONS:  Current Outpatient Prescriptions  Medication Sig Dispense Refill  . anastrozole (ARIMIDEX) 1 MG tablet Take 1 tablet (1 mg total) by mouth daily.  90 tablet  3  . aspirin 81 MG tablet Take 81 mg by mouth daily.        Marland Kitchen atenolol (TENORMIN) 50 MG tablet Take 1 tablet (50 mg total) by mouth daily.  90 tablet  3  . cholecalciferol (VITAMIN D) 1000 UNITS tablet Take 2,000 Units by mouth 2 (two) times daily.       . famotidine (PEPCID) 40 MG tablet Take 1 tablet (40 mg total) by mouth at bedtime.  90 tablet  3  . magnesium chloride (SLOW-MAG) 64 MG TBEC Take by mouth as needed.       . simvastatin (ZOCOR) 20 MG tablet Take 1 tablet (20 mg total) by mouth daily.  90 tablet  3  . valsartan-hydrochlorothiazide (DIOVAN HCT) 320-25 MG per tablet Take 1 tablet by mouth daily.  90 tablet  3  . bifidobacterium infantis (ALIGN) capsule Take 1 capsule by mouth daily.  28 capsule  0  . cephALEXin (KEFLEX) 500 MG capsule Take 1 capsule (500 mg total) by mouth as needed. 1 after sex  90 capsule  1  . furosemide (LASIX) 20 MG tablet Take 1 tablet (20 mg total) by mouth  daily.  30 tablet  1  . potassium chloride SA (K-DUR,KLOR-CON) 20 MEQ tablet Take 1 tablet (20 mEq total) by mouth daily.  30 tablet  3  . psyllium (METAMUCIL) 58.6 % packet Take 1 packet by mouth 2 (two) times daily.       No current facility-administered medications for this visit.    SURGICAL HISTORY:  Past Surgical History  Procedure Laterality Date  . Kidney stones    . Dilation and curettage of uterus    . Shoulder arthroscopy rt 2009    . Shoulder surgery    . Breast lumpectomy  07/01/11    w/sentinal node bx - right side  . Colonoscopy w/ biopsies    . Esophagogastroduodenoscopy       REVIEW OF SYSTEMS:  Pertinent items are noted in HPI.   HEALTH MAINTENANCE:   PHYSICAL EXAMINATION: Blood pressure 132/69, pulse 66, temperature 98.4 F (36.9 C), temperature source Oral, resp. rate 18, height 5\' 5"  (1.651 m), weight 186 lb 3 oz (84.454 kg). Body mass index is 30.98 kg/(m^2). ECOG PERFORMANCE STATUS: 0 - Asymptomatic   General appearance: alert, cooperative and appears stated age Resp: clear to auscultation bilaterally Cardio: regular rate and rhythm GI: soft, non-tender; bowel sounds normal; no masses,  no organomegaly Extremities: extremities normal, atraumatic, no cyanosis or edema Neurologic: Grossly normal   LABORATORY DATA: Lab Results  Component Value Date   WBC 5.1 04/18/2013   HGB 12.5 04/18/2013   HCT 36.3 04/18/2013   MCV 86.5 04/18/2013   PLT 186 04/18/2013      Chemistry      Component Value Date/Time   NA 144 04/18/2013 0851   NA 142 03/25/2013 1154   K 3.5 04/18/2013 0851   K 3.4* 03/25/2013 1154   CL 103 03/25/2013 1154   CL 99 10/07/2012 0859   CO2 31* 04/18/2013 0851   CO2 29 03/25/2013 1154   BUN 15.3 04/18/2013 0851   BUN 15 03/25/2013 1154   CREATININE 1.0 04/18/2013 0851   CREATININE 0.8 03/25/2013 1154      Component Value Date/Time   CALCIUM 9.3 04/18/2013 0851   CALCIUM 9.2 03/25/2013 1154   ALKPHOS 93 04/18/2013 0851   ALKPHOS 94 05/06/2012 1028   AST 17 04/18/2013 0851   AST 15 05/06/2012 1028   ALT 16 04/18/2013 0851   ALT 14 05/06/2012 1028   BILITOT 0.57 04/18/2013 0851   BILITOT 0.5 05/06/2012 1028     Diagnosis 1. Breast, lumpectomy, Right - INVASIVE GRADE 1 DUCTAL ADENOCARCINOMA, SPANNING 2.2 CM. - ASSOCIATED INTERMEDIATE GRADE DUCTAL CARCINOMA IN SITU. - MARGINS ARE NEGATIVE. - LYMPHOVASCULAR INVASION NOT IDENTIFIED. - SEE ONCOLOGY TEMPLATE. 2. Breast, excision, Additional anterior superior margin - BENIGN BREAST PARENCHYMA. - NO DUCTAL CARCINOMA IN SITU OR MALIGNANCY IDENTIFIED. 3. Lymph node, sentinel, biopsy, Right axillary - ONE  LYMPH NODE WITH METASTATIC DUCTAL ADENOCARCINOMA. - METASTATIC FOCUS MEASURES 0.1 CM. 4. Lymph node, sentinel, biopsy, Right axillary - ONE BENIGN LYMPH NODE, NO TUMOR SEEN (0/1). Microscopic Comment 1. BREAST, WITH LYMPH NODE SAMPLING Specimen, including laterality: Right breast lumpectomy Procedure: Right breast lumpectomy Tumor size of largest invasive carcinoma (gross measurement): 2.2 cm Margins: Invasive component, distance to closest margin: 0.3 cm In situ component, distance to closest margin: 0.4 cm Lymph - Vascular invasion: Not identified Histologic type, invasive component: Ductal Grade, invasive component (Nottingham combined histologic score): I Tubule formation grade: 2 Nuclear pleomorphism grade: 2 Mitotic grade: 1 Ductal carcinoma in situ: Intermediate grade Nuclear  grade: Intermediate Necrosis: Not identified 1 of 3 FINAL for Alexandra, Calderon [phone]) Microscopic Comment(continued) Extensive intraductal component: No Lobular neoplasia present: No Treatment effect (if treated with neoadjuvant therapy): No Multicentric (separate tumors in different quadrants): No Multifocal (separate tumors in same quadrant or biopsy): No Macroscopic or microscopic extent of tumor: Confined to breast parenchyma Axillary lymph nodes: Number examined: 2 Number with metastasis: 1 Micrometastasis (> 0.64mm, < 2mm): 1 Extracapsular extension: Tumor invades into capsule but not through the capsule (no true extracapillary extension) Method of detection of metastases: H&E TNM Code: pT2, pN31mi Breast Prognostic Markers: Estrogen receptor: 100%, positive Progesterone receptor: 99%, positive Ki 67 (Mib-1): 16%, low Her 2 neu by [facility]: No amplification Non-neoplastic breast: benign fibrocystic changes with usual ductal hyperplasia. Comments: Her-2/neu by [facility] will be repeated on the current specimen. Zandra Abts MD Pathologist, Electronic Signature  RADIOGRAPHIC STUDIES:  No  results found.  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 Patient is status post lumpectomy with sentinel lymph node biopsy with one of 2 lymph nodes positive for micrometastatic disease. Postoperatively patient did extremely well. She received adjuvant radiation therapy by Dr. Chipper Herb which he completed in December 2012. She was then begun on adjuvant Arimidex 1 mg daily she is tolerating this well without any significant problems.   PLAN:  #1 overall you are doing well. There is no evidence of recurrent breast cancer.  #2 I would suggest you continue Arimidex 1 mg daily.  #3 we discussed obtaining a bone density scan since you are on Arimidex and there is increased risk for bone loss. She will require another scan in the next 6 months. I have ordered this for you in January 2015 prior to your visit with me.  #4 please continue to take calcium in your food products as well as vitamin D since bones do need this for ongoing good bone health and to help prevent fractures.  #5 I will plan on seeing you back in 6 months time for followup   All questions were answered. The patient knows to call the clinic with any problems, questions or concerns. We can certainly see the patient much sooner if necessary.  I spent 25 minutes counseling the patient face to face. The total time spent in the appointment was 30 minutes.    Drue Second, MD Medical/Oncology [facility] [phone] (beeper) [phone] (Office)  04/25/2013, 10:48 AM

## 2013-04-25 NOTE — Telephone Encounter (Signed)
appts made and printed...td 

## 2013-04-25 NOTE — Telephone Encounter (Signed)
Pt wanted to move her labs a week befor her dov. gv appt for 10/19/12 @ 9am...td

## 2013-04-25 NOTE — Patient Instructions (Addendum)
#  1 overall you are doing well. There is no evidence of recurrent breast cancer.  #2 I would suggest you continue Arimidex 1 mg daily.  #3 we discussed obtaining a bone density scan since you are on Arimidex and there is increased risk for bone loss. She will require another scan in the next 6 months. I have ordered this for you in January 2015 prior to your visit with me.  #4 please continue to take calcium in your food products as well as vitamin D since bones do need this for ongoing good bone health and to help prevent fractures.  #5 I will plan on seeing you back in 6 months time for followup

## 2013-05-05 ENCOUNTER — Ambulatory Visit (INDEPENDENT_AMBULATORY_CARE_PROVIDER_SITE_OTHER): Payer: Medicare Other | Admitting: Internal Medicine

## 2013-05-05 ENCOUNTER — Encounter: Payer: Self-pay | Admitting: Internal Medicine

## 2013-05-05 VITALS — BP 140/82 | HR 64 | Temp 98.2°F | Resp 16 | Ht 65.0 in | Wt 187.0 lb

## 2013-05-05 DIAGNOSIS — N3020 Other chronic cystitis without hematuria: Secondary | ICD-10-CM

## 2013-05-05 DIAGNOSIS — K219 Gastro-esophageal reflux disease without esophagitis: Secondary | ICD-10-CM

## 2013-05-05 DIAGNOSIS — E785 Hyperlipidemia, unspecified: Secondary | ICD-10-CM

## 2013-05-05 DIAGNOSIS — R609 Edema, unspecified: Secondary | ICD-10-CM

## 2013-05-05 DIAGNOSIS — R413 Other amnesia: Secondary | ICD-10-CM

## 2013-05-05 DIAGNOSIS — I10 Essential (primary) hypertension: Secondary | ICD-10-CM

## 2013-05-05 MED ORDER — TELMISARTAN 80 MG PO TABS: 80.0000 mg | ORAL_TABLET | Freq: Every day | ORAL | Status: DC

## 2013-05-05 NOTE — Progress Notes (Signed)
Subjective:    Patient ID: Alexandra Calderon, female    DOB: 1940-12-09, 72 y.o.   MRN: [id number]  HPI Memory issues can spell world backwards and can perform clock test Blood pressure has been elevated with anxiety screening blood work up to day She is on 25 mg of diuretic  Has noted hypotension after the medications. Has noted as low as 100/58  swellingin right foot Review of Systems  Constitutional: Negative for activity change, appetite change and fatigue.  HENT: Negative for ear pain, congestion, neck pain, postnasal drip and sinus pressure.   Eyes: Negative for redness and visual disturbance.  Respiratory: Negative for cough, shortness of breath and wheezing.   Gastrointestinal: Negative for abdominal pain and abdominal distention.  Genitourinary: Negative for dysuria, frequency and menstrual problem.  Musculoskeletal: Negative for myalgias, joint swelling and arthralgias.  Skin: Negative for rash and wound.  Neurological: Negative for dizziness, weakness and headaches.  Hematological: Negative for adenopathy. Does not bruise/bleed easily.  Psychiatric/Behavioral: Negative for sleep disturbance and decreased concentration.   Past Medical History  Diagnosis Date  . Hypertension   . Osteopenia   . PVC (premature ventricular contraction)   . Hyperlipidemia   . Arthritis   . GERD (gastroesophageal reflux disease)   . Diverticulosis   . Kidney stones   . Pneumonia   . UTI (lower urinary tract infection)   . Allergy   . Hearing loss   . Palpitations   . Breast cancer   . Colon adenomas 7829,5621  . Esophagitis   . Polyp, stomach   . Irritable bowel syndrome 08/27/2012    History   Social History  . Marital Status: Married    Spouse Name: N/A    Number of Children: 2  . Years of Education: N/A   Occupational History  . retired    Social History Main Topics  . Smoking status: Never Smoker   . Smokeless tobacco: Never Used  . Alcohol Use: No  . Drug Use: No   . Sexually Active: Yes   Other Topics Concern  . Not on file   Social History Narrative   Spouse:  [name]   Children:   [name]- age 34- [place]   Alannie Amodio- age 64- [place]    Past Surgical History  Procedure Laterality Date  . Kidney stones    . Dilation and curettage of uterus    . Shoulder arthroscopy rt 2009    . Shoulder surgery    . Breast lumpectomy  07/01/11    w/sentinal node bx - right side  . Colonoscopy w/ biopsies    . Esophagogastroduodenoscopy      Family History  Problem Relation Age of Onset  . Lymphoma Father   . Cancer Father   . Hodgkin's lymphoma Father   . Diabetes Maternal Aunt   . Diabetes Paternal Grandmother   . Colon cancer Mother     Allergies  Allergen Reactions  . Levofloxacin     REACTION: rash  . Sulfamethoxazole W-Trimethoprim Rash and Other (See Comments)    Chills, fever  . Sulfonamide Derivatives Rash and Other (See Comments)    Chills, fever    Current Outpatient Prescriptions on File Prior to Visit  Medication Sig Dispense Refill  . anastrozole (ARIMIDEX) 1 MG tablet Take 1 tablet (1 mg total) by mouth daily.  90 tablet  3  . aspirin 81 MG tablet Take 81 mg by mouth daily.        Marland Kitchen  atenolol (TENORMIN) 50 MG tablet Take 1 tablet (50 mg total) by mouth daily.  90 tablet  3  . bifidobacterium infantis (ALIGN) capsule Take 1 capsule by mouth daily.  28 capsule  0  . cephALEXin (KEFLEX) 500 MG capsule Take 1 capsule (500 mg total) by mouth as needed. 1 after sex  90 capsule  1  . cholecalciferol (VITAMIN D) 1000 UNITS tablet Take 2,000 Units by mouth 2 (two) times daily.       . famotidine (PEPCID) 40 MG tablet Take 1 tablet (40 mg total) by mouth at bedtime.  90 tablet  3  . furosemide (LASIX) 20 MG tablet Take 1 tablet (20 mg total) by mouth daily.  30 tablet  1  . magnesium chloride (SLOW-MAG) 64 MG TBEC Take by mouth as needed.       . potassium chloride SA (K-DUR,KLOR-CON) 20 MEQ tablet Take 1 tablet (20 mEq  total) by mouth daily.  30 tablet  3  . psyllium (METAMUCIL) 58.6 % packet Take 1 packet by mouth 2 (two) times daily.      . simvastatin (ZOCOR) 20 MG tablet Take 1 tablet (20 mg total) by mouth daily.  90 tablet  3  . valsartan-hydrochlorothiazide (DIOVAN HCT) 320-25 MG per tablet Take 1 tablet by mouth daily.  90 tablet  3   No current facility-administered medications on file prior to visit.    BP 154/84  Pulse 64  Temp(Src) 98.2 F (36.8 C)  Resp 16  Ht 5\' 5"  (1.651 m)  Wt 187 lb (84.823 kg)  BMI 31.12 kg/m2       Objective:   Physical Exam  Vitals reviewed. Constitutional: She appears well-developed and well-nourished.  HENT:  Head: Normocephalic and atraumatic.  Neck: Normal range of motion. Neck supple.  Cardiovascular: Normal rate and regular rhythm.   Murmur heard. Abdominal: Soft. Bowel sounds are normal.  Musculoskeletal: She exhibits edema.  Swelling in right foot  Skin: Skin is warm and dry.          Assessment & Plan:  Patient has unilateral swelling of her right foot.   He has moderate calf tenderness without a Homans sign I would recommend that we proceed with a venous Doppler/duplex of the lower extremity  Do to her episodic hypotension we're to discontinue her hydrochlorothiazide as this may be related to volume depletion it is more likely to occur after taking the diuretic She will continue her Tenormin and we will change

## 2013-05-05 NOTE — Patient Instructions (Signed)
Stop the valsartan hydrochlorothiazide and replace it with plain mycarditis 80

## 2013-05-09 ENCOUNTER — Other Ambulatory Visit: Payer: Self-pay | Admitting: Obstetrics and Gynecology

## 2013-05-09 DIAGNOSIS — Z853 Personal history of malignant neoplasm of breast: Secondary | ICD-10-CM

## 2013-05-10 ENCOUNTER — Encounter (INDEPENDENT_AMBULATORY_CARE_PROVIDER_SITE_OTHER): Payer: Medicare Other

## 2013-05-10 DIAGNOSIS — M79609 Pain in unspecified limb: Secondary | ICD-10-CM

## 2013-05-10 DIAGNOSIS — R609 Edema, unspecified: Secondary | ICD-10-CM

## 2013-05-10 DIAGNOSIS — M7989 Other specified soft tissue disorders: Secondary | ICD-10-CM

## 2013-05-12 ENCOUNTER — Encounter: Payer: Self-pay | Admitting: Internal Medicine

## 2013-05-18 ENCOUNTER — Other Ambulatory Visit: Payer: Self-pay

## 2013-06-01 ENCOUNTER — Other Ambulatory Visit: Payer: Self-pay

## 2013-06-01 ENCOUNTER — Telehealth: Payer: Self-pay | Admitting: Internal Medicine

## 2013-06-01 ENCOUNTER — Other Ambulatory Visit: Payer: Self-pay | Admitting: Oncology

## 2013-06-01 DIAGNOSIS — Z853 Personal history of malignant neoplasm of breast: Secondary | ICD-10-CM

## 2013-06-01 MED ORDER — TELMISARTAN-HCTZ 80-12.5 MG PO TABS: 1.0000 | ORAL_TABLET | Freq: Every day | ORAL | Status: DC

## 2013-06-01 NOTE — Telephone Encounter (Signed)
Per dr Lovell Sheehan- change to micardis 80/12.5-pt informed and request 3 months to [facility] and 10 to [facility]

## 2013-06-01 NOTE — Telephone Encounter (Signed)
Patient Information:  Caller Name: Iris  Phone: [phone]  Patient: Alexandra Calderon, Alexandra Calderon  Gender: Female  DOB: 05-08-1941  Age: 72 Years  PCP: Darryll Capers (Adults only)  Office Follow Up:  Does the office need to follow up with this patient?: Yes  Instructions For The Office: PLS SEE RN NOTE  RN Note:  Pt switched from Diovan to Micardis on 7-27, Pt has noticed her BP elevated on 8-2, in the 140ies per Pt.   BP was 182/91 at 0630 on 8-20 and 147/72 at 0730 on 8-20 after taking meds.  Pt denies symptoms at time of call, per High BP protocol.  Pt would also like to let Dr Lovell Sheehan know her Ankles are still swollen, "same as when he saw me on 7-27".  PLEASE DISCUSS W/ DR Lovell Sheehan AND F/U W/ PT IF HTN WILL BE CHANGES OR APPT IS NEEDED.  Symptoms  Reason For Call & Symptoms: Elevated BP  Reviewed Health History In EMR: Yes  Reviewed Medications In EMR: Yes  Reviewed Allergies In EMR: Yes  Reviewed Surgeries / Procedures: Yes  Date of Onset of Symptoms: 05/14/2013  Guideline(s) Used:  High Blood Pressure  Disposition Per Guideline:   See Within 2 Weeks in Office  Reason For Disposition Reached:   BP > 140/90 and is taking BP medications  Advice Given:  N/A  Patient Will Follow Care Advice:  YES

## 2013-06-06 ENCOUNTER — Ambulatory Visit
Admission: RE | Admit: 2013-06-06 | Discharge: 2013-06-06 | Disposition: A | Discharge status: Home / Self Care | Payer: Medicare Other | Source: Ambulatory Visit | Attending: Obstetrics and Gynecology | Admitting: Obstetrics and Gynecology

## 2013-06-06 DIAGNOSIS — Z853 Personal history of malignant neoplasm of breast: Secondary | ICD-10-CM

## 2013-07-05 ENCOUNTER — Ambulatory Visit: Payer: Medicare Other

## 2013-07-05 ENCOUNTER — Ambulatory Visit (INDEPENDENT_AMBULATORY_CARE_PROVIDER_SITE_OTHER): Payer: Medicare Other

## 2013-07-05 DIAGNOSIS — Z23 Encounter for immunization: Secondary | ICD-10-CM

## 2013-07-08 ENCOUNTER — Other Ambulatory Visit: Payer: Self-pay | Admitting: *Deleted

## 2013-07-08 DIAGNOSIS — C50919 Malignant neoplasm of unspecified site of unspecified female breast: Secondary | ICD-10-CM

## 2013-07-08 MED ORDER — ANASTROZOLE 1 MG PO TABS: 1.0000 mg | ORAL_TABLET | Freq: Every day | ORAL | Status: DC

## 2013-07-08 MED ORDER — FAMOTIDINE 40 MG PO TABS: 40.0000 mg | ORAL_TABLET | Freq: Every day | ORAL | Status: DC

## 2013-08-15 ENCOUNTER — Ambulatory Visit (INDEPENDENT_AMBULATORY_CARE_PROVIDER_SITE_OTHER): Payer: Medicare Other | Admitting: Internal Medicine

## 2013-08-15 ENCOUNTER — Encounter: Payer: Self-pay | Admitting: Internal Medicine

## 2013-08-15 VITALS — BP 126/80 | HR 76 | Temp 98.2°F | Resp 16 | Ht 65.0 in | Wt 184.0 lb

## 2013-08-15 DIAGNOSIS — E785 Hyperlipidemia, unspecified: Secondary | ICD-10-CM

## 2013-08-15 DIAGNOSIS — Z23 Encounter for immunization: Secondary | ICD-10-CM

## 2013-08-15 DIAGNOSIS — T887XXA Unspecified adverse effect of drug or medicament, initial encounter: Secondary | ICD-10-CM

## 2013-08-15 DIAGNOSIS — I10 Essential (primary) hypertension: Secondary | ICD-10-CM

## 2013-08-15 LAB — HEPATIC FUNCTION PANEL
ALT: 16 U/L (ref 0–35)
AST: 14 U/L (ref 0–37)
Albumin: 3.8 g/dL (ref 3.5–5.2)
Alkaline Phosphatase: 101 U/L (ref 39–117)
Bilirubin, Direct: 0.1 mg/dL (ref 0.0–0.3)
Total Bilirubin: 0.7 mg/dL (ref 0.3–1.2)
Total Protein: 7.3 g/dL (ref 6.0–8.3)

## 2013-08-15 LAB — BASIC METABOLIC PANEL WITH GFR
BUN: 15 mg/dL (ref 6–23)
CO2: 32 meq/L (ref 19–32)
Calcium: 9.1 mg/dL (ref 8.4–10.5)
Chloride: 104 meq/L (ref 96–112)
Creatinine, Ser: 1 mg/dL (ref 0.4–1.2)
GFR: 60.97 mL/min
Glucose, Bld: 90 mg/dL (ref 70–99)
Potassium: 4.1 meq/L (ref 3.5–5.1)
Sodium: 141 meq/L (ref 135–145)

## 2013-08-15 LAB — LIPID PANEL
Cholesterol: 150 mg/dL (ref 0–200)
HDL: 38.2 mg/dL — ABNORMAL LOW
LDL Cholesterol: 84 mg/dL (ref 0–99)
Total CHOL/HDL Ratio: 4
Triglycerides: 140 mg/dL (ref 0.0–149.0)
VLDL: 28 mg/dL (ref 0.0–40.0)

## 2013-08-15 NOTE — Progress Notes (Signed)
Subjective:    Patient ID: Alexandra Calderon, female    DOB: Oct 12, 1941, 72 y.o.   MRN: [id number]  HPI  pneumonia shot before age of 37 and will be due second pneumonia shot today.  Patientdoes not know she had chickenpox and not so we will get a chickenpox titer before pestering a shingles vaccination.   Followup of hypertension stable Followup of hyperlipidemia lipid and liver due today    Review of Systems  Constitutional: Negative for activity change, appetite change and fatigue.  HENT: Negative for congestion, ear pain, postnasal drip and sinus pressure.   Eyes: Negative for redness and visual disturbance.  Respiratory: Negative for cough, shortness of breath and wheezing.   Gastrointestinal: Negative for abdominal pain and abdominal distention.  Genitourinary: Negative for dysuria, frequency and menstrual problem.  Musculoskeletal: Negative for arthralgias, joint swelling, myalgias and neck pain.  Skin: Negative for rash and wound.  Neurological: Negative for dizziness, weakness and headaches.  Hematological: Negative for adenopathy. Does not bruise/bleed easily.  Psychiatric/Behavioral: Negative for sleep disturbance and decreased concentration.   Past Medical History  Diagnosis Date  . Hypertension   . Osteopenia   . PVC (premature ventricular contraction)   . Hyperlipidemia   . Arthritis   . GERD (gastroesophageal reflux disease)   . Diverticulosis   . Kidney stones   . Pneumonia   . UTI (lower urinary tract infection)   . Allergy   . Hearing loss   . Palpitations   . Breast cancer   . Colon adenomas 7829,5621  . Esophagitis   . Polyp, stomach   . Irritable bowel syndrome 08/27/2012    History   Social History  . Marital Status: Married    Spouse Name: N/A    Number of Children: 2  . Years of Education: N/A   Occupational History  . retired    Social History Main Topics  . Smoking status: Never Smoker   . Smokeless tobacco: Never Used  . Alcohol  Use: No  . Drug Use: No  . Sexual Activity: Yes   Other Topics Concern  . Not on file   Social History Narrative   Spouse:  Alexandra Calderon   Children:   Alexandra Calderon- age 44- [place]   Alexandra Calderon- age 70- [place]    Past Surgical History  Procedure Laterality Date  . Kidney stones    . Dilation and curettage of uterus    . Shoulder arthroscopy rt 2009    . Shoulder surgery    . Breast lumpectomy  07/01/11    w/sentinal node bx - right side  . Colonoscopy w/ biopsies    . Esophagogastroduodenoscopy      Family History  Problem Relation Age of Onset  . Lymphoma Father   . Cancer Father   . Hodgkin's lymphoma Father   . Diabetes Maternal Aunt   . Diabetes Paternal Grandmother   . Colon cancer Mother     Allergies  Allergen Reactions  . Levofloxacin     REACTION: rash  . Sulfamethoxazole-Trimethoprim Rash and Other (See Comments)    Chills, fever  . Sulfonamide Derivatives Rash and Other (See Comments)    Chills, fever    Current Outpatient Prescriptions on File Prior to Visit  Medication Sig Dispense Refill  . anastrozole (ARIMIDEX) 1 MG tablet Take 1 tablet (1 mg total) by mouth daily.  90 tablet  1  . aspirin 81 MG tablet Take 81 mg by mouth daily.        Marland Kitchen  atenolol (TENORMIN) 50 MG tablet Take 1 tablet (50 mg total) by mouth daily.  90 tablet  3  . cephALEXin (KEFLEX) 500 MG capsule Take 1 capsule (500 mg total) by mouth as needed. 1 after sex  90 capsule  1  . cholecalciferol (VITAMIN D) 1000 UNITS tablet Take 2,000 Units by mouth 2 (two) times daily.       . famotidine (PEPCID) 40 MG tablet Take 1 tablet (40 mg total) by mouth at bedtime.  90 tablet  3  . magnesium chloride (SLOW-MAG) 64 MG TBEC Take by mouth as needed.       . psyllium (METAMUCIL) 58.6 % packet Take 1 packet by mouth 2 (two) times daily.      . simvastatin (ZOCOR) 20 MG tablet Take 1 tablet (20 mg total) by mouth daily.  90 tablet  3  . telmisartan-hydrochlorothiazide (MICARDIS HCT) 80-12.5  MG per tablet Take 1 tablet by mouth daily.  10 tablet  1   No current facility-administered medications on file prior to visit.    BP 126/80  Pulse 76  Temp(Src) 98.2 F (36.8 C)  Resp 16  Ht 5\' 5"  (1.651 m)  Wt 184 lb (83.462 kg)  BMI 30.62 kg/m2   ]    Objective:   Physical Exam  Constitutional: She is oriented to person, place, and time. She appears well-developed and well-nourished. No distress.  HENT:  Head: Normocephalic and atraumatic.  Left Ear: External ear normal.  Eyes: Conjunctivae and EOM are normal. Pupils are equal, round, and reactive to light.  Neck: Normal range of motion. Neck supple. No JVD present. No tracheal deviation present. No thyromegaly present.  Cardiovascular: Normal rate and regular rhythm.   Murmur heard. Pulmonary/Chest: Effort normal and breath sounds normal. She has no wheezes. She exhibits no tenderness.  Abdominal: Soft. Bowel sounds are normal.  Musculoskeletal: She exhibits edema and tenderness.  Lymphadenopathy:    She has no cervical adenopathy.  Neurological: She is alert and oriented to person, place, and time. She has normal reflexes. No cranial nerve deficit.  Skin: Skin is warm and dry. She is not diaphoretic.  Psychiatric: She has a normal mood and affect. Her behavior is normal.          Assessment & Plan:  Chickenpox we will obtain appropriate titers and immunize based upon the titers.  Patient's blood pressure is excellent on her current medications because of her use of Demadex we will check a lipid and liver today.

## 2013-08-15 NOTE — Patient Instructions (Signed)
If the titer is high you do not need to shingles shot The titer is low then you should consider the shingles shot The titer is nonexistent or 0 the knee will need to be immunized against chickenpox because you have never had

## 2013-08-15 NOTE — Addendum Note (Signed)
Addended by: Iveth Heidemann M on: 08/15/2013 12:26 PM   Modules accepted: Orders  

## 2013-08-18 LAB — VARICELLA ZOSTER ANTIBODY, IGG: Varicella IgG: 650.5 {index} — ABNORMAL HIGH

## 2013-08-25 ENCOUNTER — Encounter: Payer: Self-pay | Admitting: Internal Medicine

## 2013-08-29 ENCOUNTER — Telehealth: Payer: Self-pay | Admitting: Internal Medicine

## 2013-08-29 NOTE — Telephone Encounter (Signed)
She can come in this Friday ,nov 21 at 2pm

## 2013-08-29 NOTE — Telephone Encounter (Signed)
Appt scheduled, pt notified.

## 2013-08-29 NOTE — Telephone Encounter (Signed)
Pt requesting work in appt to get injection due to severa pain related to arthritis in her hand.  Pt declines to see NP or any other provider due to previous experience.  Please advise if pt can be worked in any slots that are on hold this week.

## 2013-09-02 ENCOUNTER — Encounter: Payer: Self-pay | Admitting: Internal Medicine

## 2013-09-02 ENCOUNTER — Ambulatory Visit (INDEPENDENT_AMBULATORY_CARE_PROVIDER_SITE_OTHER): Payer: Medicare Other | Admitting: Internal Medicine

## 2013-09-02 VITALS — BP 130/80 | HR 72 | Temp 98.2°F | Resp 16 | Ht 65.0 in | Wt 184.0 lb

## 2013-09-02 DIAGNOSIS — M19039 Primary osteoarthritis, unspecified wrist: Secondary | ICD-10-CM

## 2013-09-02 DIAGNOSIS — M19031 Primary osteoarthritis, right wrist: Secondary | ICD-10-CM

## 2013-09-02 MED ORDER — METHYLPREDNISOLONE ACETATE 40 MG/ML IJ SUSP: 40.0000 mg | Freq: Once | INTRAMUSCULAR | Status: DC

## 2013-09-02 NOTE — Progress Notes (Signed)
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 Local anesthesia was obtained with topical spray. Then 40 mg of Depo-Medrol and 1/2 cc of lidocaine was injected into the joint space. The patient tolerated the procedure without complications. Post injection care discussed with patient.

## 2013-09-02 NOTE — Telephone Encounter (Signed)
Lm informing the pt that KK will be in [facility] on 1.14.15. gv appt for 11/09/13 @ 1pm. Made pt aware that i will mail a letter/avs...td

## 2013-09-02 NOTE — Patient Instructions (Signed)
You have received a steroid injection into a joint space. It will take up to 48 hours before you notice a difference in the pain in the joint. For the next few hours keep ice on the site of the injection. Do not exert the injected joint for the next 24 hours.  

## 2013-10-19 ENCOUNTER — Other Ambulatory Visit (HOSPITAL_BASED_OUTPATIENT_CLINIC_OR_DEPARTMENT_OTHER): Payer: Medicare Other

## 2013-10-19 DIAGNOSIS — M899 Disorder of bone, unspecified: Secondary | ICD-10-CM

## 2013-10-19 DIAGNOSIS — C50911 Malignant neoplasm of unspecified site of right female breast: Secondary | ICD-10-CM

## 2013-10-19 DIAGNOSIS — C50119 Malignant neoplasm of central portion of unspecified female breast: Secondary | ICD-10-CM

## 2013-10-19 DIAGNOSIS — M949 Disorder of cartilage, unspecified: Secondary | ICD-10-CM

## 2013-10-19 LAB — COMPREHENSIVE METABOLIC PANEL (CC13)
ALT: 13 U/L (ref 0–55)
AST: 13 U/L (ref 5–34)
Albumin: 3.5 g/dL (ref 3.5–5.0)
Alkaline Phosphatase: 117 U/L (ref 40–150)
Anion Gap: 8 meq/L (ref 3–11)
BUN: 13.3 mg/dL (ref 7.0–26.0)
CO2: 32 meq/L — ABNORMAL HIGH (ref 22–29)
Calcium: 9.3 mg/dL (ref 8.4–10.4)
Chloride: 104 meq/L (ref 98–109)
Creatinine: 0.8 mg/dL (ref 0.6–1.1)
Glucose: 104 mg/dL (ref 70–140)
Potassium: 3.4 meq/L — ABNORMAL LOW (ref 3.5–5.1)
Sodium: 144 meq/L (ref 136–145)
Total Bilirubin: 0.93 mg/dL (ref 0.20–1.20)
Total Protein: 7.1 g/dL (ref 6.4–8.3)

## 2013-10-19 LAB — CBC WITH DIFFERENTIAL/PLATELET
BASO%: 0.4 % (ref 0.0–2.0)
Basophils Absolute: 0 10*3/uL (ref 0.0–0.1)
EOS%: 2.7 % (ref 0.0–7.0)
Eosinophils Absolute: 0.1 10*3/uL (ref 0.0–0.5)
HCT: 37.3 % (ref 34.8–46.6)
HGB: 12.5 g/dL (ref 11.6–15.9)
LYMPH%: 24.4 % (ref 14.0–49.7)
MCH: 29.7 pg (ref 25.1–34.0)
MCHC: 33.6 g/dL (ref 31.5–36.0)
MCV: 88.4 fL (ref 79.5–101.0)
MONO#: 0.5 10*3/uL (ref 0.1–0.9)
MONO%: 9.2 % (ref 0.0–14.0)
NEUT#: 3.5 10*3/uL (ref 1.5–6.5)
NEUT%: 63.3 % (ref 38.4–76.8)
Platelets: 205 10*3/uL (ref 145–400)
RBC: 4.22 10*6/uL (ref 3.70–5.45)
RDW: 14.1 % (ref 11.2–14.5)
WBC: 5.6 10*3/uL (ref 3.9–10.3)
lymph#: 1.4 10*3/uL (ref 0.9–3.3)

## 2013-10-20 LAB — VITAMIN D 25 HYDROXY (VIT D DEFICIENCY, FRACTURES): Vit D, 25-Hydroxy: 62 ng/mL (ref 30–89)

## 2013-10-24 ENCOUNTER — Other Ambulatory Visit: Payer: Medicare Other

## 2013-10-26 ENCOUNTER — Ambulatory Visit: Payer: Medicare Other | Admitting: Oncology

## 2013-10-26 ENCOUNTER — Other Ambulatory Visit: Payer: Medicare Other | Admitting: Lab

## 2013-11-08 ENCOUNTER — Ambulatory Visit
Admission: RE | Admit: 2013-11-08 | Discharge: 2013-11-08 | Disposition: A | Discharge status: Home / Self Care | Payer: Medicare Other | Source: Ambulatory Visit | Attending: Oncology | Admitting: Oncology

## 2013-11-08 DIAGNOSIS — C50911 Malignant neoplasm of unspecified site of right female breast: Secondary | ICD-10-CM

## 2013-11-09 ENCOUNTER — Encounter: Payer: Self-pay | Admitting: Oncology

## 2013-11-09 ENCOUNTER — Ambulatory Visit (HOSPITAL_BASED_OUTPATIENT_CLINIC_OR_DEPARTMENT_OTHER): Payer: Medicare Other | Admitting: Oncology

## 2013-11-09 ENCOUNTER — Encounter: Payer: Self-pay | Admitting: Internal Medicine

## 2013-11-09 ENCOUNTER — Telehealth: Payer: Self-pay | Admitting: *Deleted

## 2013-11-09 VITALS — BP 136/75 | HR 62 | Temp 98.5°F | Resp 18 | Ht 65.0 in | Wt 188.5 lb

## 2013-11-09 DIAGNOSIS — Z853 Personal history of malignant neoplasm of breast: Secondary | ICD-10-CM

## 2013-11-09 DIAGNOSIS — C773 Secondary and unspecified malignant neoplasm of axilla and upper limb lymph nodes: Secondary | ICD-10-CM

## 2013-11-09 DIAGNOSIS — M899 Disorder of bone, unspecified: Secondary | ICD-10-CM

## 2013-11-09 DIAGNOSIS — M949 Disorder of cartilage, unspecified: Secondary | ICD-10-CM

## 2013-11-09 DIAGNOSIS — C50919 Malignant neoplasm of unspecified site of unspecified female breast: Secondary | ICD-10-CM

## 2013-11-09 MED ORDER — ANASTROZOLE 1 MG PO TABS: 1.0000 mg | ORAL_TABLET | Freq: Every day | ORAL | Status: DC

## 2013-11-09 NOTE — Telephone Encounter (Signed)
appts made and printed...td 

## 2013-11-09 NOTE — Progress Notes (Signed)
OFFICE PROGRESS NOTE  CC**  [name], MD [place] [address] Dr. Arloa Koh Dr. 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  PRIOR THERAPY:  #1 patient originally presented in August 2012 for a screening mammogram and she was noted to have a possible mass in the right breast. She therefore had diagnostic procedures performed including a biopsy. The biopsy was performed on 06/11/2011 that revealed in the right breast at the 12:00 position invasive mammary carcinoma.  #2 patient then went on to have a lumpectomy with sentinel lymph node biopsy on 07/01/2011. The pathology revealed a 2.2 cm invasive ductal carcinoma that was grade 1. One node had microscopic disease. Tumor was ER positive PR positive HER-2/neu negative with a Ki-67 of 16%  #3 patient then went on to have adjuvant radiation therapy by Dr. Arloa Koh. She completed this on 09/25/2011.  #4 patient was then begun on arimidex 1 milligram daily. A total of 5 years of therapy is planned  CURRENT THERAPY:arimidex 1 mg daily  INTERVAL HISTORY: Alexandra Calderon 73 y.o. female returns for followup visit Overall she's doing well she is tolerating her Arimidex without any significant problems. She denies any fevers chills night sweats headaches shortness of breath chest pains palpitations no myalgias and arthralgias no peripheral paresthesias. She did have a bone density scan performed on January 27 this did show osteopenia. We discussed treatment for it which would include bisphosphonates such as Fosamax 35 mg by mouth. But patient does not want to take this at this time.Remainder of the 10 point review of systems is negative.  MEDICAL HISTORY: Past Medical History  Diagnosis Date  . Hypertension   . Osteopenia   . PVC (premature ventricular contraction)   . Hyperlipidemia   .  Arthritis   . GERD (gastroesophageal reflux disease)   . Diverticulosis   . Kidney stones   . Pneumonia   . UTI (lower urinary tract infection)   . Allergy   . Hearing loss   . Palpitations   . Breast cancer   . Colon adenomas [id number]  . Esophagitis   . Polyp, stomach   . Irritable bowel syndrome 08/27/2012    ALLERGIES:  is allergic to levofloxacin; sulfamethoxazole-trimethoprim; and sulfonamide derivatives.  MEDICATIONS:  Current Outpatient Prescriptions  Medication Sig Dispense Refill  . anastrozole (ARIMIDEX) 1 MG tablet Take 1 tablet (1 mg total) by mouth daily.  90 tablet  6  . atenolol (TENORMIN) 50 MG tablet Take 1 tablet (50 mg total) by mouth daily.  90 tablet  3  . cholecalciferol (VITAMIN D) 1000 UNITS tablet Take 2,000 Units by mouth 2 (two) times daily.       . famotidine (PEPCID) 40 MG tablet Take 1 tablet (40 mg total) by mouth at bedtime.  90 tablet  3  . magnesium chloride (SLOW-MAG) 64 MG TBEC Take by mouth as needed.       . Omega-3 Fatty Acids (FISH OIL PO) Take by mouth daily.      . simvastatin (ZOCOR) 20 MG tablet Take 1 tablet (20 mg total) by mouth daily.  90 tablet  3  . telmisartan-hydrochlorothiazide (MICARDIS HCT) 80-12.5 MG per tablet Take 1 tablet by mouth daily.  10 tablet  1   No current facility-administered medications for this visit.    SURGICAL HISTORY:  Past Surgical History  Procedure Laterality Date  .  Kidney stones    . Dilation and curettage of uterus    . Shoulder arthroscopy rt 2009    . Shoulder surgery    . Breast lumpectomy  07/01/11    w/sentinal node bx - right side  . Colonoscopy w/ biopsies    . Esophagogastroduodenoscopy      REVIEW OF SYSTEMS:  Pertinent items are noted in HPI.   HEALTH MAINTENANCE:   PHYSICAL EXAMINATION: Blood pressure 136/75, pulse 62, temperature 98.5 F (36.9 C), temperature source Oral, resp. rate 18, height '5\' 5"'  (1.651 m), weight 188 lb 8 oz (85.503 kg). Body mass index is 31.37  kg/(m^2). ECOG PERFORMANCE STATUS: 0 - Asymptomatic   General appearance: alert, cooperative and appears stated age Resp: clear to auscultation bilaterally Cardio: regular rate and rhythm GI: soft, non-tender; bowel sounds normal; no masses,  no organomegaly Extremities: extremities normal, atraumatic, no cyanosis or edema Neurologic: Grossly normal Breasts: breasts appear normal, no suspicious masses, no skin or nipple changes or axillary nodes.   LABORATORY DATA: Lab Results  Component Value Date   WBC 5.6 10/19/2013   HGB 12.5 10/19/2013   HCT 37.3 10/19/2013   MCV 88.4 10/19/2013   PLT 205 10/19/2013      Chemistry      Component Value Date/Time   NA 144 10/19/2013 0904   NA 141 08/15/2013 1144   K 3.4* 10/19/2013 0904   K 4.1 08/15/2013 1144   CL 104 08/15/2013 1144   CL 99 10/07/2012 0859   CO2 32* 10/19/2013 0904   CO2 32 08/15/2013 1144   BUN 13.3 10/19/2013 0904   BUN 15 08/15/2013 1144   CREATININE 0.8 10/19/2013 0904   CREATININE 1.0 08/15/2013 1144      Component Value Date/Time   CALCIUM 9.3 10/19/2013 0904   CALCIUM 9.1 08/15/2013 1144   ALKPHOS 117 10/19/2013 0904   ALKPHOS 101 08/15/2013 1144   AST 13 10/19/2013 0904   AST 14 08/15/2013 1144   ALT 13 10/19/2013 0904   ALT 16 08/15/2013 1144   BILITOT 0.93 10/19/2013 0904   BILITOT 0.7 08/15/2013 1144     Diagnosis 1. Breast, lumpectomy, Right - INVASIVE GRADE 1 DUCTAL ADENOCARCINOMA, SPANNING 2.2 CM. - ASSOCIATED INTERMEDIATE GRADE DUCTAL CARCINOMA IN SITU. - MARGINS ARE NEGATIVE. - LYMPHOVASCULAR INVASION NOT IDENTIFIED. - SEE ONCOLOGY TEMPLATE. 2. Breast, excision, Additional anterior superior margin - BENIGN BREAST PARENCHYMA. - NO DUCTAL CARCINOMA IN SITU OR MALIGNANCY IDENTIFIED. 3. Lymph node, sentinel, biopsy, Right axillary - ONE LYMPH NODE WITH METASTATIC DUCTAL ADENOCARCINOMA. - METASTATIC FOCUS MEASURES 0.1 CM. 4. Lymph node, sentinel, biopsy, Right axillary - ONE BENIGN LYMPH NODE, NO TUMOR SEEN (0/1). Microscopic  Comment 1. BREAST, WITH LYMPH NODE SAMPLING Specimen, including laterality: Right breast lumpectomy Procedure: Right breast lumpectomy Tumor size of largest invasive carcinoma (gross measurement): 2.2 cm Margins: Invasive component, distance to closest margin: 0.3 cm In situ component, distance to closest margin: 0.4 cm Lymph - Vascular invasion: Not identified Histologic type, invasive component: Ductal Grade, invasive component (Nottingham combined histologic score): I Tubule formation grade: 2 Nuclear pleomorphism grade: 2 Mitotic grade: 1 Ductal carcinoma in situ: Intermediate grade Nuclear grade: Intermediate Necrosis: Not identified 1 of 3 FINAL for Calderon, Alexandra J ([id number]) Microscopic Comment(continued) Extensive intraductal component: No Lobular neoplasia present: No Treatment effect (if treated with neoadjuvant therapy): No Multicentric (separate tumors in different quadrants): No Multifocal (separate tumors in same quadrant or biopsy): No Macroscopic or microscopic extent of tumor: Confined to breast parenchyma  Axillary lymph nodes: Number examined: 2 Number with metastasis: 1 Micrometastasis (> 0.53m, < 266m: 1 Extracapsular extension: Tumor invades into capsule but not through the capsule (no true extracapillary extension) Method of detection of metastases: H&E TNM Code: pT2, [id number] Prognostic Markers: Estrogen receptor: 100%, positive Progesterone receptor: 99%, positive Ki 67 (Mib-1): 16%, low Her 2 neu by [facility]: No amplification Non-neoplastic breast: benign fibrocystic changes with usual ductal hyperplasia. Comments: Her-2/neu by [facility] will be repeated on the current specimen. ROBWilleen Niece Pathologist, Electronic Signature  RADIOGRAPHIC STUDIES:  No results found.  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 Patient is status post  lumpectomy with sentinel lymph node biopsy with one of 2 lymph nodes positive for micrometastatic disease. Postoperatively patient did extremely well. She received adjuvant radiation therapy by Dr. RobArloa Kohich he completed in December 2012. She was then begun on adjuvant Arimidex 1 mg daily she is tolerating this well without any significant problems.  #2 osteopenia: bone density performed on 11/08/2013 revealed low bone mass. We discussed starting the patient on bisphosphonate such as Fosamax. I explained the rationale for it. She is a little bit reluctant to do so. We did discuss starting vitamin D as well as calcium. She will think about it. I will continue to have these discussions with her.  #3patient will be seen back in 6 months time for followup    All questions were answered. The patient knows to call the clinic with any problems, questions or concerns. We can certainly see the patient much sooner if necessary.  I spent 20 minutes counseling the patient face to face. The total time spent in the appointment was 30 minutes.    KalMarcy PanningD Medical/Oncology [phone]) [phone])  11/09/2013, 1:20 PM

## 2013-11-09 NOTE — Patient Instructions (Signed)
Denosumab injection  What is this medicine?  DENOSUMAB (den oh sue mab) slows bone breakdown. Prolia is used to treat osteoporosis in women after menopause and in men. Xgeva is used to prevent bone fractures and other bone problems caused by cancer bone metastases. Xgeva is also used to treat giant cell tumor of the bone.  This medicine may be used for other purposes; ask your health care provider or pharmacist if you have questions.  COMMON BRAND NAME(S): Prolia, XGEVA  What should I tell my health care provider before I take this medicine?  They need to know if you have any of these conditions:  -dental disease  -eczema  -infection or history of infections  -kidney disease or on dialysis  -low blood calcium or vitamin D  -malabsorption syndrome  -scheduled to have surgery or tooth extraction  -taking medicine that contains denosumab  -thyroid or parathyroid disease  -an unusual reaction to denosumab, other medicines, foods, dyes, or preservatives  -pregnant or trying to get pregnant  -breast-feeding  How should I use this medicine?  This medicine is for injection under the skin. It is given by a health care professional in a hospital or clinic setting.  If you are getting Prolia, a special MedGuide will be given to you by the pharmacist with each prescription and refill. Be sure to read this information carefully each time.  For Prolia, talk to your pediatrician regarding the use of this medicine in children. Special care may be needed. For Xgeva, talk to your pediatrician regarding the use of this medicine in children. While this drug may be prescribed for children as young as 13 years for selected conditions, precautions do apply.  Overdosage: If you think you've taken too much of this medicine contact a poison control center or emergency room at once.  Overdosage: If you think you have taken too much of this medicine contact a poison control center or emergency room at once.  NOTE: This medicine is only for  you. Do not share this medicine with others.  What if I miss a dose?  It is important not to miss your dose. Call your doctor or health care professional if you are unable to keep an appointment.  What may interact with this medicine?  Do not take this medicine with any of the following medications:  -other medicines containing denosumab  This medicine may also interact with the following medications:  -medicines that suppress the immune system  -medicines that treat cancer  -steroid medicines like prednisone or cortisone  This list may not describe all possible interactions. Give your health care provider a list of all the medicines, herbs, non-prescription drugs, or dietary supplements you use. Also tell them if you smoke, drink alcohol, or use illegal drugs. Some items may interact with your medicine.  What should I watch for while using this medicine?  Visit your doctor or health care professional for regular checks on your progress. Your doctor or health care professional may order blood tests and other tests to see how you are doing.  Call your doctor or health care professional if you get a cold or other infection while receiving this medicine. Do not treat yourself. This medicine may decrease your body's ability to fight infection.  You should make sure you get enough calcium and vitamin D while you are taking this medicine, unless your doctor tells you not to. Discuss the foods you eat and the vitamins you take with your health care professional.    See your dentist regularly. Brush and floss your teeth as directed. Before you have any dental work done, tell your dentist you are receiving this medicine.  Do not become pregnant while taking this medicine or for 5 months after stopping it. Women should inform their doctor if they wish to become pregnant or think they might be pregnant. There is a potential for serious side effects to an unborn child. Talk to your health care professional or pharmacist for more  information.  What side effects may I notice from receiving this medicine?  Side effects that you should report to your doctor or health care professional as soon as possible:  -allergic reactions like skin rash, itching or hives, swelling of the face, lips, or tongue  -breathing problems  -chest pain  -fast, irregular heartbeat  -feeling faint or lightheaded, falls  -fever, chills, or any other sign of infection  -muscle spasms, tightening, or twitches  -numbness or tingling  -skin blisters or bumps, or is dry, peels, or red  -slow healing or unexplained pain in the mouth or jaw  -unusual bleeding or bruising  Side effects that usually do not require medical attention (Report these to your doctor or health care professional if they continue or are bothersome.):  -muscle pain  -stomach upset, gas  This list may not describe all possible side effects. Call your doctor for medical advice about side effects. You may report side effects to FDA at 1-800-FDA-1088.  Where should I keep my medicine?  This medicine is only given in a clinic, doctor's office, or other health care setting and will not be stored at home.  NOTE: This sheet is a summary. It may not cover all possible information. If you have questions about this medicine, talk to your doctor, pharmacist, or health care provider.  © 2014, [employer]vier/Gold Standard. (2012-03-29 12:37:47)

## 2013-11-16 ENCOUNTER — Other Ambulatory Visit: Payer: Self-pay | Admitting: *Deleted

## 2013-11-16 ENCOUNTER — Telehealth: Payer: Self-pay | Admitting: Internal Medicine

## 2013-11-16 MED ORDER — TELMISARTAN-HCTZ 80-12.5 MG PO TABS: 1.0000 | ORAL_TABLET | Freq: Every day | ORAL | Status: DC

## 2013-11-16 NOTE — Telephone Encounter (Signed)
done

## 2013-11-16 NOTE — Telephone Encounter (Signed)
PRIMEMAIL (MAIL ORDER) requesting new script for atenolol (TENORMIN) 50 MG tablet

## 2013-11-24 ENCOUNTER — Other Ambulatory Visit: Payer: Self-pay | Admitting: *Deleted

## 2013-11-24 MED ORDER — ATENOLOL 50 MG PO TABS: 50.0000 mg | ORAL_TABLET | Freq: Every day | ORAL | Status: DC

## 2014-01-16 ENCOUNTER — Ambulatory Visit (INDEPENDENT_AMBULATORY_CARE_PROVIDER_SITE_OTHER): Payer: Medicare Other | Admitting: Internal Medicine

## 2014-01-16 ENCOUNTER — Encounter: Payer: Self-pay | Admitting: Internal Medicine

## 2014-01-16 VITALS — BP 170/80 | HR 69 | Temp 98.3°F | Ht 65.0 in | Wt 189.0 lb

## 2014-01-16 DIAGNOSIS — R300 Dysuria: Secondary | ICD-10-CM

## 2014-01-16 DIAGNOSIS — M545 Low back pain, unspecified: Secondary | ICD-10-CM

## 2014-01-16 DIAGNOSIS — I10 Essential (primary) hypertension: Secondary | ICD-10-CM

## 2014-01-16 DIAGNOSIS — Z23 Encounter for immunization: Secondary | ICD-10-CM

## 2014-01-16 LAB — COMPREHENSIVE METABOLIC PANEL WITH GFR
ALT: 18 U/L (ref 0–35)
AST: 19 U/L (ref 0–37)
Albumin: 3.8 g/dL (ref 3.5–5.2)
Alkaline Phosphatase: 108 U/L (ref 39–117)
BUN: 12 mg/dL (ref 6–23)
CO2: 34 meq/L — ABNORMAL HIGH (ref 19–32)
Calcium: 9.3 mg/dL (ref 8.4–10.5)
Chloride: 99 meq/L (ref 96–112)
Creatinine, Ser: 0.8 mg/dL (ref 0.4–1.2)
GFR: 71.05 mL/min
Glucose, Bld: 94 mg/dL (ref 70–99)
Potassium: 3.8 meq/L (ref 3.5–5.1)
Sodium: 141 meq/L (ref 135–145)
Total Bilirubin: 0.8 mg/dL (ref 0.3–1.2)
Total Protein: 7.5 g/dL (ref 6.0–8.3)

## 2014-01-16 LAB — URINALYSIS, ROUTINE W REFLEX MICROSCOPIC
Bilirubin Urine: NEGATIVE
Ketones, ur: NEGATIVE
Leukocytes, UA: NEGATIVE
Nitrite: NEGATIVE
Specific Gravity, Urine: 1.01
Total Protein, Urine: NEGATIVE
Urine Glucose: NEGATIVE
Urobilinogen, UA: 0.2
pH: 7 (ref 5.0–8.0)

## 2014-01-16 MED ORDER — AMLODIPINE BESYLATE 5 MG PO TABS: 5.0000 mg | ORAL_TABLET | Freq: Every day | ORAL | Status: DC

## 2014-01-16 MED ORDER — PNEUMOCOCCAL 13-VAL CONJ VACC IM SUSP: 0.5000 mL | INTRAMUSCULAR | Status: DC

## 2014-01-16 NOTE — Patient Instructions (Signed)
The patient is instructed to continue all medications as prescribed. Schedule followup with check out clerk upon leaving the clinic  

## 2014-01-16 NOTE — Progress Notes (Signed)
Pre visit review using our clinic review tool, if applicable. No additional management support is needed unless otherwise documented below in the visit note. 

## 2014-01-16 NOTE — Addendum Note (Signed)
Addended by: Monico Blitz T on: 01/16/2014 12:00 PM   Modules accepted: Orders

## 2014-01-16 NOTE — Progress Notes (Signed)
Subjective:    Patient ID: Alexandra Calderon, female    DOB: [birth year], 73 y.o.   MRN: [id number]  HPI  Breast cancer on arimidex HTN on attenolol and micardis Zocor for lipids  Review of Systems  Constitutional: Negative for diaphoresis, activity change, appetite change and fatigue.  HENT: Negative for congestion, ear pain, postnasal drip and sinus pressure.   Eyes: Negative for redness and visual disturbance.  Respiratory: Positive for shortness of breath. Negative for cough and wheezing.   Gastrointestinal: Negative for abdominal pain and abdominal distention.  Genitourinary: Negative for dysuria, frequency and menstrual problem.  Musculoskeletal: Positive for gait problem and joint swelling. Negative for arthralgias, myalgias and neck pain.       Foot pain  Skin: Negative for rash and wound.  Neurological: Negative for dizziness, weakness and headaches.  Hematological: Negative for adenopathy. Does not bruise/bleed easily.  Psychiatric/Behavioral: Negative for sleep disturbance and decreased concentration.       Past Medical History  Diagnosis Date  . Hypertension   . Osteopenia   . PVC (premature ventricular contraction)   . Hyperlipidemia   . Arthritis   . GERD (gastroesophageal reflux disease)   . Diverticulosis   . Kidney stones   . Pneumonia   . UTI (lower urinary tract infection)   . Allergy   . Hearing loss   . Palpitations   . Breast cancer   . 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 Esophagitis   . Polyp, stomach   . Irritable bowel syndrome 08/27/2012    History   Social History  . Marital Status: Married    Spouse Name: N/A    Number of Children: 2  . Years of Education: N/A   Occupational History  . retired    Social History Main Topics  . Smoking status: Never Smoker   . Smokeless tobacco: Never Used  . Alcohol Use: No  . Drug Use: No  . Sexual Activity: Yes   Other Topics Concern  . Not on file   Social History Narrative   Spouse:  [name]   Children:   [name]- age 25- [name]- age 40- [name]    Past Surgical History  Procedure Laterality Date  . Kidney stones    . Dilation and curettage of uterus    . Shoulder arthroscopy rt 2009    . Shoulder surgery    . Breast lumpectomy  07/01/11    w/sentinal node bx - right side  . Colonoscopy w/ biopsies    . Esophagogastroduodenoscopy      Family History  Problem Relation Age of Onset  . Lymphoma Father   . Cancer Father   . Hodgkin's lymphoma Father   . Diabetes Maternal Aunt   . Diabetes Paternal Grandmother   . Colon cancer Mother     Allergies  Allergen Reactions  . Levofloxacin     REACTION: rash  . Sulfamethoxazole-Trimethoprim Rash and Other (See Comments)    Chills, fever  . Sulfonamide Derivatives Rash and Other (See Comments)    Chills, fever    Current Outpatient Prescriptions on File Prior to Visit  Medication Sig Dispense Refill  . anastrozole (ARIMIDEX) 1 MG tablet Take 1 tablet (1 mg total) by mouth daily.  90 tablet  6  . atenolol (TENORMIN) 50 MG tablet Take 1 tablet (50 mg total) by mouth daily.  90 tablet  3  . cholecalciferol (VITAMIN D) 1000 UNITS tablet Take 2,000 Units by  mouth 2 (two) times daily.       . famotidine (PEPCID) 40 MG tablet Take 1 tablet (40 mg total) by mouth at bedtime.  90 tablet  3  . magnesium chloride (SLOW-MAG) 64 MG TBEC Take by mouth as needed.       . Omega-3 Fatty Acids (FISH OIL PO) Take by mouth daily.      . simvastatin (ZOCOR) 20 MG tablet Take 1 tablet (20 mg total) by mouth daily.  90 tablet  3  . telmisartan-hydrochlorothiazide (MICARDIS HCT) 80-12.5 MG per tablet Take 1 tablet by mouth daily.  90 tablet  3   No current facility-administered medications on file prior to visit.    BP 170/80  Pulse 69  Temp(Src) 98.3 F (36.8 C) (Oral)  Ht 5\' 5"  (1.651 m)  Wt 189 lb (85.73 kg)  BMI 31.45 kg/m2  SpO2 96%    Objective:   Physical Exam  Constitutional: She appears  well-nourished.  Eyes: Conjunctivae are normal. Pupils are equal, round, and reactive to light.  Neck: No JVD present. No tracheal deviation present. No thyromegaly present.  Cardiovascular: Normal rate.   Murmur heard. Pulmonary/Chest: Effort normal and breath sounds normal.  Abdominal: Soft. Bowel sounds are normal.  Musculoskeletal: She exhibits tenderness.  Plantar fascial pain left heel  Skin: She is diaphoretic.          Assessment & Plan:  Due the prevnar  Add norvasc 5 for elevated blood pressure  At HS Mild exertional  Chest SOB  Mild dysuria  UA C and S

## 2014-01-17 ENCOUNTER — Telehealth: Payer: Self-pay | Admitting: Internal Medicine

## 2014-01-17 NOTE — Telephone Encounter (Signed)
Relevant patient education assigned to patient using Emmi. ° °

## 2014-01-25 ENCOUNTER — Encounter: Payer: Self-pay | Admitting: Internal Medicine

## 2014-02-20 ENCOUNTER — Telehealth: Payer: Self-pay | Admitting: Internal Medicine

## 2014-02-20 NOTE — Telephone Encounter (Signed)
Pt aware to cut pill in half.  Also told pt to monitor bp's all week and call back on Friday with readings.  Pt verbalized understanding and had questions

## 2014-02-20 NOTE — Telephone Encounter (Signed)
Pt is on norvasc at night and her bp has been running 116/54 and lower. CAN is down. Please advise

## 2014-02-20 NOTE — Telephone Encounter (Signed)
Per Dr Arnoldo Morale cut in half

## 2014-02-24 ENCOUNTER — Telehealth: Payer: Self-pay | Admitting: Internal Medicine

## 2014-02-24 NOTE — Telephone Encounter (Signed)
Alexandra Calderon pt  called and said that after a week this is what is going on with her since  using  amLODipine (NORVASC) 5 MG tablet her bp was to low then was told to take a half of pill  And the half a pill kind of makes the top number go up a bit then after taking morning meds the top number goes down  She would like a call back as to what is the best thing to do

## 2014-03-03 NOTE — Telephone Encounter (Signed)
Keep an eye on the blood pressure and if the top number is greater than 140 would resume the full dose but take the medication  2.5 BID

## 2014-03-06 ENCOUNTER — Emergency Department (HOSPITAL_COMMUNITY)
Admission: EM | Admit: 2014-03-06 | Discharge: 2014-03-06 | Disposition: A | Discharge status: Home / Self Care | Payer: Medicare Other | Attending: Emergency Medicine | Admitting: Emergency Medicine

## 2014-03-06 ENCOUNTER — Encounter (HOSPITAL_COMMUNITY): Payer: Self-pay | Admitting: Emergency Medicine

## 2014-03-06 DIAGNOSIS — R002 Palpitations: Secondary | ICD-10-CM | POA: Insufficient documentation

## 2014-03-06 DIAGNOSIS — Z87442 Personal history of urinary calculi: Secondary | ICD-10-CM | POA: Insufficient documentation

## 2014-03-06 DIAGNOSIS — H902 Conductive hearing loss, unspecified: Secondary | ICD-10-CM | POA: Insufficient documentation

## 2014-03-06 DIAGNOSIS — Z7982 Long term (current) use of aspirin: Secondary | ICD-10-CM | POA: Insufficient documentation

## 2014-03-06 DIAGNOSIS — K219 Gastro-esophageal reflux disease without esophagitis: Secondary | ICD-10-CM | POA: Insufficient documentation

## 2014-03-06 DIAGNOSIS — Z853 Personal history of malignant neoplasm of breast: Secondary | ICD-10-CM | POA: Insufficient documentation

## 2014-03-06 DIAGNOSIS — E785 Hyperlipidemia, unspecified: Secondary | ICD-10-CM | POA: Insufficient documentation

## 2014-03-06 DIAGNOSIS — I10 Essential (primary) hypertension: Secondary | ICD-10-CM | POA: Insufficient documentation

## 2014-03-06 DIAGNOSIS — F411 Generalized anxiety disorder: Secondary | ICD-10-CM | POA: Insufficient documentation

## 2014-03-06 DIAGNOSIS — Z8701 Personal history of pneumonia (recurrent): Secondary | ICD-10-CM | POA: Insufficient documentation

## 2014-03-06 DIAGNOSIS — R609 Edema, unspecified: Secondary | ICD-10-CM | POA: Insufficient documentation

## 2014-03-06 DIAGNOSIS — Z87440 Personal history of urinary (tract) infections: Secondary | ICD-10-CM | POA: Insufficient documentation

## 2014-03-06 DIAGNOSIS — Z79899 Other long term (current) drug therapy: Secondary | ICD-10-CM | POA: Insufficient documentation

## 2014-03-06 DIAGNOSIS — Z860100 Personal history of colon polyps, unspecified: Secondary | ICD-10-CM | POA: Insufficient documentation

## 2014-03-06 LAB — I-STAT CHEM 8, ED
BUN: 17 mg/dL (ref 6–23)
Calcium, Ion: 1.2 mmol/L (ref 1.13–1.30)
Chloride: 98 meq/L (ref 96–112)
Creatinine, Ser: 1 mg/dL (ref 0.50–1.10)
Glucose, Bld: 104 mg/dL — ABNORMAL HIGH (ref 70–99)
HCT: 41 % (ref 36.0–46.0)
Hemoglobin: 13.9 g/dL (ref 12.0–15.0)
Potassium: 3.7 meq/L (ref 3.7–5.3)
Sodium: 145 meq/L (ref 137–147)
TCO2: 28 mmol/L (ref 0–100)

## 2014-03-06 MED ORDER — ALPRAZOLAM 0.25 MG PO TABS: 0.2500 mg | ORAL_TABLET | Freq: Every evening | ORAL | Status: DC | PRN

## 2014-03-06 NOTE — ED Provider Notes (Signed)
CSN: [id number]     Arrival date & time 03/06/14  1053 History   First MD Initiated Contact with Patient 03/06/14 1125     Chief Complaint  Patient presents with  . Palpitations     (Consider location/radiation/quality/duration/timing/severity/associated sxs/prior Treatment) HPI Patient complains of palpitations "a flip-flopping of my heart" onset at age 73 worse over the past few weeks. Patient stopped her Norvasc because she read that Norvasc can cause palpitations. She denies excessive caffeine use. Drinks one cup of decaffeinated coffee per day. Denies chest pain denies shortness of breath denies lightheadedness denies other symptoms. No treatment prior to coming here nothing makes symptoms better or worse however symptoms are worse at night. She admits that she is under emotional stress because her husband is leaving to take a long trip Past Medical History  Diagnosis Date  . Hypertension   . Osteopenia   . PVC (premature ventricular contraction)   . Hyperlipidemia   . Arthritis   . GERD (gastroesophageal reflux disease)   . Diverticulosis   . Kidney stones   . Pneumonia   . UTI (lower urinary tract infection)   . Allergy   . Hearing loss   . Palpitations   . Breast cancer   . Colon adenomas 9518,8416  . Esophagitis   . Polyp, stomach   . Irritable bowel syndrome 08/27/2012   Past Surgical History  Procedure Laterality Date  . Kidney stones    . Dilation and curettage of uterus    . Shoulder arthroscopy rt 2009    . Shoulder surgery    . Breast lumpectomy  07/01/11    w/sentinal node bx - right side  . Colonoscopy w/ biopsies    . Esophagogastroduodenoscopy     Family History  Problem Relation Age of Onset  . Lymphoma Father   . Cancer Father   . Hodgkin's lymphoma Father   . Diabetes Maternal Aunt   . Diabetes Paternal Grandmother   . Colon cancer Mother    History  Substance Use Topics  . Smoking status: Never Smoker   . Smokeless tobacco: Never Used  .  Alcohol Use: No   OB History   Grav Para Term Preterm Abortions TAB SAB Ect Mult Living                 Review of Systems  Cardiovascular: Positive for palpitations and leg swelling.       Bilateral Leg swelling for several years  Psychiatric/Behavioral:       Anxiety  All other systems reviewed and are negative.     Allergies  Levofloxacin; Sulfamethoxazole-trimethoprim; and Sulfonamide derivatives  Home Medications   Prior to Admission medications   Medication Sig Start Date End Date Taking? Authorizing Provider  amLODipine (NORVASC) 5 MG tablet Take 1 tablet (5 mg total) by mouth daily. 01/16/14  Yes Ricard Dillon, MD  anastrozole (ARIMIDEX) 1 MG tablet Take 1 tablet (1 mg total) by mouth daily. 11/09/13  Yes Deatra Robinson, MD  aspirin 81 MG tablet Take 81 mg by mouth at bedtime.   Yes Historical Provider, MD  atenolol (TENORMIN) 50 MG tablet Take 1 tablet (50 mg total) by mouth daily. 11/24/13  Yes Ricard Dillon, MD  cholecalciferol (VITAMIN D) 1000 UNITS tablet Take 2,000 Units by mouth daily.    Yes Historical Provider, MD  famotidine (PEPCID) 40 MG tablet Take 1 tablet (40 mg total) by mouth at bedtime. 07/08/13  Yes Ricard Dillon, MD  magnesium  chloride (SLOW-MAG) 64 MG TBEC Take 1 tablet by mouth daily.    Yes Historical Provider, MD  omega-3 acid ethyl esters (LOVAZA) 1 G capsule Take 1 g by mouth daily.   Yes Historical Provider, MD  simvastatin (ZOCOR) 20 MG tablet Take 1 tablet (20 mg total) by mouth daily. 04/09/12  Yes Ricard Dillon, MD  telmisartan-hydrochlorothiazide (MICARDIS HCT) 80-12.5 MG per tablet Take 1 tablet by mouth daily. 11/16/13  Yes Ricard Dillon, MD   BP 155/97  Pulse 63  Temp(Src) 97.8 F (36.6 C) (Oral)  Resp 16  Ht 5\' 6"  (1.676 m)  Wt 188 lb (85.276 kg)  BMI 30.36 kg/m2  SpO2 100% Physical Exam  Nursing note and vitals reviewed. Constitutional: She appears well-developed and well-nourished.  HENT:  Head: Normocephalic and atraumatic.   Eyes: Conjunctivae are normal. Pupils are equal, round, and reactive to light.  Neck: Neck supple. No tracheal deviation present. No thyromegaly present.  Cardiovascular: Normal rate and regular rhythm.   No murmur heard. Pulmonary/Chest: Effort normal and breath sounds normal.  Abdominal: Soft. Bowel sounds are normal. She exhibits no distension. There is no tenderness.  Musculoskeletal: Normal range of motion. She exhibits edema. She exhibits no tenderness.  Trace pretibial pitting edema bilaterally  Neurological: She is alert. Coordination normal.  Skin: Skin is warm and dry. No rash noted.  Psychiatric: She has a normal mood and affect.    ED Course  Procedures (including critical care time) Labs Review Labs Reviewed - No data to display  Imaging Review No results found.   EKG Interpretation   Date/Time:  Monday Mar 06 2014 10:57:58 EDT Ventricular Rate:  63 PR Interval:  174 QRS Duration: 82 QT Interval:  420 QTC Calculation: 429 R Axis:   -27 Text Interpretation:  Sinus rhythm with Premature atrial complexes  Nonspecific ST and T wave abnormality Abnormal ECG No significant change  since last tracing Confirmed by Winfred Leeds  MD, Tyrian Peart [phone]) on 03/06/2014  11:09:21 AM     Results for orders placed during the hospital encounter of 03/06/14  I-STAT CHEM 8, ED      Result Value Ref Range   Sodium 145  137 - 147 mEq/L   Potassium 3.7  3.7 - 5.3 mEq/L   Chloride 98  96 - 112 mEq/L   BUN 17  6 - 23 mg/dL   Creatinine, Ser 1.00  0.50 - 1.10 mg/dL   Glucose, Bld 104 (*) 70 - 99 mg/dL   Calcium, Ion 1.20  1.13 - 1.30 mmol/L   TCO2 28  0 - 100 mmol/L   Hemoglobin 13.9  12.0 - 15.0 g/dL   HCT 41.0  36.0 - 46.0 %   No results found.  MDM  Patient's palpitations are long-standing. I don't feel of a dangerous nature. I did e-mail Dr. Arnoldo Morale. Suggest event monitor. Will write prescription for Xanax as this has helped her in the past. I told patient It is okay for her to  stay off of Norvasc until further advissed by Dr. Arnoldo Morale Diagnosis #1 palpitations #2 anxiety Final diagnoses:  None        Orlie Dakin, MD 03/06/14 1420

## 2014-03-06 NOTE — ED Notes (Signed)
Pt reports having irregular heartbeat and palpitations since wed and unable to sleep at night. Denies any chest pain, sob or dizziness.

## 2014-03-06 NOTE — Discharge Instructions (Signed)
Cardiac Event Monitoring Call Dr. Arnoldo Morale tomorrow to arrange a followup appointment. We feel that he should have a cardiac event monitor placed by his office A cardiac event monitor is a small recording device used to help detect abnormal heart rhythms (arrhythmias). The monitor is used to record heart rhythm when noticeable symptoms such as the following occur:  Fast heart beats (palpitations), such as heart racing or fluttering.  Dizziness.  Fainting or lightheadedness.  Unexplained weakness. The monitor is wired to two electrodes placed on your chest. Electrodes are flat, sticky disks that attach to your skin. The monitor can be worn for up to 30 days. You will wear the monitor at all times, except when bathing.  HOW TO USE YOUR CARDIAC EVENT MONITOR A technician will prepare your chest for the electrode placement. The technician will show you how to place the electrodes, how to work the monitor, and how to replace the batteries. Take time to practice using the monitor before you leave the office. Make sure you understand how to send the information from the monitor to your health care provider. This requires a telephone with a landline, not a cellphone. You need to:  Wear your monitor at all times, except when you are in water:  Do not get the monitor wet.  Take the monitor off when bathing. Do not swim or use a hot tub with it on.  Keep your skin clean. Do not put body lotion or moisturizer on your chest.  Change the electrodes daily or any time they stop sticking to your skin. You might need to use tape to keep them on.  It is possible that your skin under the electrodes could become irritated. To keep this from happening, try to put the electrodes in slightly different places on your chest. However, they must remain in the area under your left breast and in the upper right section of your chest.  Make sure the monitor is safely clipped to your clothing or in a location close to  your body that your health care provider recommends.  Press the button to record when you feel symptoms of heart trouble, such as dizziness, weakness, lightheadedness, palpitations, thumping, shortness of breath, unexplained weakness, or a fluttering or racing heart. The monitor is always on and records what happened slightly before you pressed the button, so do not worry about being too late to get good information.  Keep a diary of your activities, such as walking, doing chores, and taking medicine. It is especially important to note what you were doing when you pushed the button to record your symptoms. This will help your health care provider determine what might be contributing to your symptoms. The information stored in your monitor will be reviewed by your health care provider alongside your diary entries.  Send the recorded information as recommended by your health care provider. It is important to understand that it will take some time for your health care provider to process the results.  Change the batteries as recommended by your health care provider. SEEK IMMEDIATE MEDICAL CARE IF:   You have chest pain.  You have extreme difficulty breathing or shortness of breath.  You develop a very fast heartbeat that persists.  You develop dizziness that does not go away .  You faint or constantly feel you are about to faint. Document Released: 07/08/2008 Document Revised: 06/01/2013 Document Reviewed: 03/28/2013 [employer] Patient Information 2014 [place], [place].

## 2014-03-09 NOTE — Telephone Encounter (Signed)
Left message for pt to call back  °

## 2014-03-10 ENCOUNTER — Encounter: Payer: Self-pay | Admitting: Internal Medicine

## 2014-03-10 ENCOUNTER — Ambulatory Visit (INDEPENDENT_AMBULATORY_CARE_PROVIDER_SITE_OTHER): Payer: Medicare Other | Admitting: Internal Medicine

## 2014-03-10 VITALS — BP 130/80 | HR 60 | Temp 98.3°F | Resp 18 | Ht 66.0 in | Wt 191.0 lb

## 2014-03-10 DIAGNOSIS — R002 Palpitations: Secondary | ICD-10-CM

## 2014-03-10 DIAGNOSIS — I10 Essential (primary) hypertension: Secondary | ICD-10-CM

## 2014-03-10 MED ORDER — ALPRAZOLAM 0.25 MG PO TABS: 0.2500 mg | ORAL_TABLET | Freq: Every evening | ORAL | Status: DC | PRN

## 2014-03-10 MED ORDER — ATENOLOL 100 MG PO TABS: 100.0000 mg | ORAL_TABLET | Freq: Every day | ORAL | Status: DC

## 2014-03-10 NOTE — Patient Instructions (Addendum)
Limit your sodium (Salt) intake  Please check your blood pressure on a regular basis.  If it is consistently greater than 150/90, please make an office appointment.  Increase atenolol to 100 mg dailyPalpitations  A palpitation is the feeling that your heartbeat is irregular or is faster than normal. It may feel like your heart is fluttering or skipping a beat. Palpitations are usually not a serious problem. However, in some cases, you may need further medical evaluation. CAUSES  Palpitations can be caused by:  Smoking.  Caffeine or other stimulants, such as diet pills or energy drinks.  Alcohol.  Stress and anxiety.  Strenuous physical activity.  Fatigue.  Certain medicines.  Heart disease, especially if you have a history of arrhythmias. This includes atrial fibrillation, atrial flutter, or supraventricular tachycardia.  An improperly working pacemaker or defibrillator. DIAGNOSIS  To find the cause of your palpitations, your caregiver will take your history and perform a physical exam. Tests may also be done, including:  Electrocardiography (ECG). This test records the heart's electrical activity.  Cardiac monitoring. This allows your caregiver to monitor your heart rate and rhythm in real time.  Holter monitor. This is a portable device that records your heartbeat and can help diagnose heart arrhythmias. It allows your caregiver to track your heart activity for several days, if needed.  Stress tests by exercise or by giving medicine that makes the heart beat faster. TREATMENT  Treatment of palpitations depends on the cause of your symptoms and can vary greatly. Most cases of palpitations do not require any treatment other than time, relaxation, and monitoring your symptoms. Other causes, such as atrial fibrillation, atrial flutter, or supraventricular tachycardia, usually require further treatment. HOME CARE INSTRUCTIONS   Avoid:  Caffeinated coffee, tea, soft drinks, diet  pills, and energy drinks.  Chocolate.  Alcohol.  Stop smoking if you smoke.  Reduce your stress and anxiety. Things that can help you relax include:  A method that measures bodily functions so you can learn to control them (biofeedback).  Yoga.  Meditation.  Physical activity such as swimming, jogging, or walking.  Get plenty of rest and sleep. SEEK MEDICAL CARE IF:   You continue to have a fast or irregular heartbeat beyond 24 hours.  Your palpitations occur more often. SEEK IMMEDIATE MEDICAL CARE IF:  You develop chest pain or shortness of breath.  You have a severe headache.  You feel dizzy, or you faint. MAKE SURE YOU:  Understand these instructions.  Will watch your condition.  Will get help right away if you are not doing well or get worse. Document Released: 09/26/2000 Document Revised: 01/24/2013 Document Reviewed: 11/28/2011 [facility] Patient Information 2014 [place].

## 2014-03-10 NOTE — Progress Notes (Signed)
Subjective:    Patient ID: Alexandra Calderon, female    DOB: 1941-02-10, 73 y.o.   MRN: [id number]  HPI  73 year old patient who is seen today in followup.  She was seen in the ED several days ago due to palpitations.  She has a long history of palpitations since her teenage years.  These are described as skipped beats and mild fluttering, but no history of tachyarrhythmias.  She states she often checks her pulse rate during these episodes and usually pulses are in the seventies.  No associated symptoms except for anxiety.  She does feel that stress tends to aggravate the palpitations.  For the past 2 or 3 years.  She feels these palpitations have increased.  About one week ago she discontinued amlodipine, bleeding, that this might have been a factor with the palpitations.  She continues to notice occasional skipped beats.  No prior 2-D echo.  ED physician suggested an event monitor. Past medical history is pertinent for breast cancer and multi-drug-resistant hypertension.  ED records reviewed  Past Medical History  Diagnosis Date  . Hypertension   . Osteopenia   . PVC (premature ventricular contraction)   . Hyperlipidemia   . Arthritis   . GERD (gastroesophageal reflux disease)   . Diverticulosis   . Kidney stones   . Pneumonia   . UTI (lower urinary tract infection)   . Allergy   . Hearing loss   . Palpitations   . Breast cancer   . Colon adenomas [id number]  . Esophagitis   . Polyp, stomach   . Irritable bowel syndrome 08/27/2012    History   Social History  . Marital Status: Married    Spouse Name: N/A    Number of Children: 2  . Years of Education: N/A   Occupational History  . retired    Social History Main Topics  . Smoking status: Never Smoker   . Smokeless tobacco: Never Used  . Alcohol Use: No  . Drug Use: No  . Sexual Activity: Yes   Other Topics Concern  . Not on file   Social History Narrative   Spouse:  [name]   Children:   [name]- age 71-  [employer]- age 81- [name]    Past Surgical History  Procedure Laterality Date  . Kidney stones    . Dilation and curettage of uterus    . Shoulder arthroscopy rt 2009    . Shoulder surgery    . Breast lumpectomy  07/01/11    w/sentinal node bx - right side  . Colonoscopy w/ biopsies    . Esophagogastroduodenoscopy      Family History  Problem Relation Age of Onset  . Lymphoma Father   . Cancer Father   . Hodgkin's lymphoma Father   . Diabetes Maternal Aunt   . Diabetes Paternal Grandmother   . Colon cancer Mother     Allergies  Allergen Reactions  . Levofloxacin     REACTION: rash  . Sulfamethoxazole-Trimethoprim Rash and Other (See Comments)    Chills, fever  . Sulfonamide Derivatives Rash and Other (See Comments)    Chills, fever    Current Outpatient Prescriptions on File Prior to Visit  Medication Sig Dispense Refill  . ALPRAZolam (XANAX) 0.25 MG tablet Take 1 tablet (0.25 mg total) by mouth at bedtime as needed for anxiety or sleep.  20 tablet  0  . anastrozole (ARIMIDEX) 1 MG tablet Take 1 tablet (1 mg total)  by mouth daily.  90 tablet  6  . aspirin 81 MG tablet Take 81 mg by mouth at bedtime.      Marland Kitchen atenolol (TENORMIN) 50 MG tablet Take 1 tablet (50 mg total) by mouth daily.  90 tablet  3  . cholecalciferol (VITAMIN D) 1000 UNITS tablet Take 2,000 Units by mouth daily.       . famotidine (PEPCID) 40 MG tablet Take 1 tablet (40 mg total) by mouth at bedtime.  90 tablet  3  . magnesium chloride (SLOW-MAG) 64 MG TBEC Take 1 tablet by mouth daily.       Marland Kitchen omega-3 acid ethyl esters (LOVAZA) 1 G capsule Take 1 g by mouth daily.      . simvastatin (ZOCOR) 20 MG tablet Take 1 tablet (20 mg total) by mouth daily.  90 tablet  3  . telmisartan-hydrochlorothiazide (MICARDIS HCT) 80-12.5 MG per tablet Take 1 tablet by mouth daily.  90 tablet  3  . amLODipine (NORVASC) 5 MG tablet Take 1 tablet (5 mg total) by mouth daily.  30 tablet  0   No current  facility-administered medications on file prior to visit.    BP 130/80  Pulse 60  Temp(Src) 98.3 F (36.8 C) (Oral)  Resp 18  Ht 5\' 6"  (1.676 m)  Wt 191 lb (86.637 kg)  BMI 30.84 kg/m2       Review of Systems  Constitutional: Negative.   HENT: Negative for congestion, dental problem, hearing loss, rhinorrhea, sinus pressure, sore throat and tinnitus.   Eyes: Negative for pain, discharge and visual disturbance.  Respiratory: Negative for cough and shortness of breath.   Cardiovascular: Positive for palpitations and leg swelling. Negative for chest pain.  Gastrointestinal: Negative for nausea, vomiting, abdominal pain, diarrhea, constipation, blood in stool and abdominal distention.  Genitourinary: Negative for dysuria, urgency, frequency, hematuria, flank pain, vaginal bleeding, vaginal discharge, difficulty urinating, vaginal pain and pelvic pain.  Musculoskeletal: Negative for arthralgias, gait problem and joint swelling.  Skin: Negative for rash.  Neurological: Negative for dizziness, syncope, speech difficulty, weakness, numbness and headaches.  Hematological: Negative for adenopathy.  Psychiatric/Behavioral: Negative for behavioral problems, dysphoric mood and agitation. The patient is not nervous/anxious.        Objective:   Physical Exam  Constitutional: She is oriented to person, place, and time. She appears well-developed and well-nourished.  Blood pressure 130/80  HENT:  Head: Normocephalic.  Right Ear: External ear normal.  Left Ear: External ear normal.  Mouth/Throat: Oropharynx is clear and moist.  Eyes: Conjunctivae and EOM are normal. Pupils are equal, round, and reactive to light.  Neck: Normal range of motion. Neck supple. No thyromegaly present.  Cardiovascular: Normal rate, regular rhythm, normal heart sounds and intact distal pulses.   Rhythm regular without ectopics  Pulmonary/Chest: Effort normal and breath sounds normal.  Abdominal: Soft. Bowel  sounds are normal. She exhibits no mass. There is no tenderness.  Musculoskeletal: Normal range of motion. She exhibits edema.  Mild pedal edema with intact peripheral pulses.  Lymphadenopathy:    She has no cervical adenopathy.  Neurological: She is alert and oriented to person, place, and time.  Skin: Skin is warm and dry. No rash noted.  Psychiatric: She has a normal mood and affect. Her behavior is normal.          Assessment & Plan:   Chronic palpitations.  No history of tachyarrhythmias.  We'll increase atenolol to 100 mg daily and maintain the patient off atenolol.  Hopeful for peripheral edema to improve, and palpitations to improve.  Options of 2-D echocardiogram and event monitor.  Discussed.  Cardiology referral.  Discussed. Hypertension.  Increase atenolol to 100 mg daily Dyslipidemia.  Continue statin therapy

## 2014-03-10 NOTE — Progress Notes (Signed)
Pre-visit discussion using our clinic review tool. No additional management support is needed unless otherwise documented below in the visit note.  

## 2014-03-24 NOTE — Telephone Encounter (Signed)
Left message for pt to call back  °

## 2014-03-31 ENCOUNTER — Ambulatory Visit: Payer: Medicare Other | Admitting: Cardiology

## 2014-04-10 ENCOUNTER — Telehealth: Payer: Self-pay | Admitting: Internal Medicine

## 2014-04-10 MED ORDER — SIMVASTATIN 20 MG PO TABS: 20.0000 mg | ORAL_TABLET | Freq: Every day | ORAL | Status: DC

## 2014-04-10 NOTE — Telephone Encounter (Signed)
[facility] - [place], [place] is requesting 90 day re-fill on simvastatin (ZOCOR) 20 MG tablet

## 2014-04-10 NOTE — Telephone Encounter (Signed)
rx sent in electronically 

## 2014-04-18 ENCOUNTER — Telehealth: Payer: Self-pay | Admitting: Internal Medicine

## 2014-04-18 ENCOUNTER — Other Ambulatory Visit: Payer: Self-pay

## 2014-04-18 NOTE — Telephone Encounter (Signed)
Will have to give to Dr. Arnoldo Morale for review.

## 2014-04-18 NOTE — Telephone Encounter (Signed)
[facility] - [place], [place] is requesting 90 day re-fill on cephALEXin (KEFLEX) 500 MG capsule

## 2014-04-20 ENCOUNTER — Telehealth: Payer: Self-pay | Admitting: Hematology and Oncology

## 2014-04-20 NOTE — Telephone Encounter (Signed)
, °

## 2014-04-21 NOTE — Telephone Encounter (Signed)
No per Dr Arnoldo Morale

## 2014-04-28 ENCOUNTER — Telehealth: Payer: Self-pay | Admitting: Internal Medicine

## 2014-04-28 NOTE — Telephone Encounter (Signed)
Pt req rx on Cephalexin   Pharmacy  [facility]

## 2014-04-28 NOTE — Telephone Encounter (Signed)
Per Dr. Arnoldo Morale this medication cannot be sent to pharmacy.

## 2014-05-03 ENCOUNTER — Other Ambulatory Visit: Payer: Self-pay | Admitting: Adult Health

## 2014-05-03 DIAGNOSIS — Z853 Personal history of malignant neoplasm of breast: Secondary | ICD-10-CM

## 2014-05-10 ENCOUNTER — Other Ambulatory Visit: Payer: Medicare Other

## 2014-05-10 ENCOUNTER — Ambulatory Visit: Payer: Medicare Other | Admitting: Oncology

## 2014-06-08 ENCOUNTER — Ambulatory Visit
Admission: RE | Admit: 2014-06-08 | Discharge: 2014-06-08 | Disposition: A | Discharge status: Home / Self Care | Payer: Medicare Other | Source: Ambulatory Visit | Attending: Adult Health | Admitting: Adult Health

## 2014-06-08 DIAGNOSIS — Z853 Personal history of malignant neoplasm of breast: Secondary | ICD-10-CM

## 2014-06-09 ENCOUNTER — Encounter: Payer: Self-pay | Admitting: Internal Medicine

## 2014-06-09 ENCOUNTER — Ambulatory Visit (INDEPENDENT_AMBULATORY_CARE_PROVIDER_SITE_OTHER): Payer: Medicare Other | Admitting: Internal Medicine

## 2014-06-09 VITALS — BP 152/72 | HR 60 | Temp 97.9°F | Resp 20 | Ht 66.0 in | Wt 188.0 lb

## 2014-06-09 DIAGNOSIS — I10 Essential (primary) hypertension: Secondary | ICD-10-CM

## 2014-06-09 DIAGNOSIS — E785 Hyperlipidemia, unspecified: Secondary | ICD-10-CM

## 2014-06-09 DIAGNOSIS — M1990 Unspecified osteoarthritis, unspecified site: Secondary | ICD-10-CM

## 2014-06-09 MED ORDER — CEPHALEXIN 500 MG PO CAPS: 500.0000 mg | ORAL_CAPSULE | ORAL | Status: DC | PRN

## 2014-06-09 NOTE — Progress Notes (Signed)
Pre visit review using our clinic review tool, if applicable. No additional management support is needed unless otherwise documented below in the visit note. 

## 2014-06-09 NOTE — Patient Instructions (Signed)
Limit your sodium (Salt) intake  Please check your blood pressure on a regular basis.  If it is consistently greater than 150/90, please make an office appointment.    It is important that you exercise regularly, at least 20 minutes 3 to 4 times per week.  If you develop chest pain or shortness of breath seek  medical attention.  Take a calcium supplement, plus 800-1200 units of vitamin D  Return in 6 months for follow-up   

## 2014-06-09 NOTE — Progress Notes (Signed)
Subjective:    Patient ID: Alexandra Calderon, female    DOB: [birth year], 73 y.o.   MRN: [id number]  HPI 73 year old patient who is seen today in followup.  She has a long history of palpitations that have been greatly improved with a titration of atenolol.  Her blood pressure remains nicely controlled but occasionally has some early morning elevations.  She generally feels quite well and very pleased with the status of her chronic palpitations. Amlodipine has been discontinued with improvement of her peripheral edema. She has dyslipidemia. No cardiac complaints She does have a history of chronic recurrent UTIs.  She uses cephalexin prophylactically Postcoital  Past Medical History  Diagnosis Date  . Hypertension   . Osteopenia   . PVC (premature ventricular contraction)   . Hyperlipidemia   . Arthritis   . GERD (gastroesophageal reflux disease)   . Diverticulosis   . Kidney stones   . Pneumonia   . UTI (lower urinary tract infection)   . Allergy   . Hearing loss   . Palpitations   . Breast cancer   . Colon adenomas [id number]  . Esophagitis   . Polyp, stomach   . Irritable bowel syndrome 08/27/2012    History   Social History  . Marital Status: Married    Spouse Name: N/A    Number of Children: 2  . Years of Education: N/A   Occupational History  . retired    Social History Main Topics  . Smoking status: Never Smoker   . Smokeless tobacco: Never Used  . Alcohol Use: No  . Drug Use: No  . Sexual Activity: Yes   Other Topics Concern  . Not on file   Social History Narrative   Spouse:  [name]   Children:   [name]- age 70- Cecilton- age 18- [name]    Past Surgical History  Procedure Laterality Date  . Kidney stones    . Dilation and curettage of uterus    . Shoulder arthroscopy rt 2009    . Shoulder surgery    . Breast lumpectomy  07/01/11    w/sentinal node bx - right side  . Colonoscopy w/ biopsies    .  Esophagogastroduodenoscopy      Family History  Problem Relation Age of Onset  . Lymphoma Father   . Cancer Father   . Hodgkin's lymphoma Father   . Diabetes Maternal Aunt   . Diabetes Paternal Grandmother   . Colon cancer Mother     Allergies  Allergen Reactions  . Levofloxacin     REACTION: rash  . Sulfamethoxazole-Trimethoprim Rash and Other (See Comments)    Chills, fever  . Sulfonamide Derivatives Rash and Other (See Comments)    Chills, fever    Current Outpatient Prescriptions on File Prior to Visit  Medication Sig Dispense Refill  . ALPRAZolam (XANAX) 0.25 MG tablet Take 1 tablet (0.25 mg total) by mouth at bedtime as needed for anxiety or sleep.  60 tablet  0  . anastrozole (ARIMIDEX) 1 MG tablet Take 1 tablet (1 mg total) by mouth daily.  90 tablet  6  . aspirin 81 MG tablet Take 81 mg by mouth at bedtime.      Marland Kitchen atenolol (TENORMIN) 100 MG tablet Take 1 tablet (100 mg total) by mouth daily.  90 tablet  3  . cholecalciferol (VITAMIN D) 1000 UNITS tablet Take 2,000 Units by mouth daily.       . famotidine (  PEPCID) 40 MG tablet Take 1 tablet (40 mg total) by mouth at bedtime.  90 tablet  3  . magnesium chloride (SLOW-MAG) 64 MG TBEC Take 1 tablet by mouth daily.       Marland Kitchen omega-3 acid ethyl esters (LOVAZA) 1 G capsule Take 1 g by mouth daily.      . simvastatin (ZOCOR) 20 MG tablet Take 1 tablet (20 mg total) by mouth daily.  90 tablet  3  . telmisartan-hydrochlorothiazide (MICARDIS HCT) 80-12.5 MG per tablet Take 1 tablet by mouth daily.  90 tablet  3   No current facility-administered medications on file prior to visit.    BP 152/72  Pulse 60  Temp(Src) 97.9 F (36.6 C) (Oral)  Resp 20  Ht 5\' 6"  (1.676 m)  Wt 188 lb (85.276 kg)  BMI 30.36 kg/m2  SpO2 98%      Review of Systems  Constitutional: Negative.   HENT: Negative for congestion, dental problem, hearing loss, rhinorrhea, sinus pressure, sore throat and tinnitus.   Eyes: Negative for pain, discharge  and visual disturbance.  Respiratory: Negative for cough and shortness of breath.   Cardiovascular: Negative for chest pain, palpitations and leg swelling.  Gastrointestinal: Negative for nausea, vomiting, abdominal pain, diarrhea, constipation, blood in stool and abdominal distention.  Genitourinary: Negative for dysuria, urgency, frequency, hematuria, flank pain, vaginal bleeding, vaginal discharge, difficulty urinating, vaginal pain and pelvic pain.  Musculoskeletal: Negative for arthralgias, gait problem and joint swelling.  Skin: Negative for rash.  Neurological: Negative for dizziness, syncope, speech difficulty, weakness, numbness and headaches.  Hematological: Negative for adenopathy.  Psychiatric/Behavioral: Negative for behavioral problems, dysphoric mood and agitation. The patient is not nervous/anxious.        Objective:   Physical Exam  Constitutional: She is oriented to person, place, and time. She appears well-developed and well-nourished.  Blood pressure 124/80  HENT:  Head: Normocephalic.  Right Ear: External ear normal.  Left Ear: External ear normal.  Mouth/Throat: Oropharynx is clear and moist.  Eyes: Conjunctivae and EOM are normal. Pupils are equal, round, and reactive to light.  Neck: Normal range of motion. Neck supple. No thyromegaly present.  Cardiovascular: Normal rate, regular rhythm, normal heart sounds and intact distal pulses.   Pulmonary/Chest: Effort normal and breath sounds normal.  Abdominal: Soft. Bowel sounds are normal. She exhibits no mass. There is no tenderness.  Musculoskeletal: Normal range of motion.  Lymphadenopathy:    She has no cervical adenopathy.  Neurological: She is alert and oriented to person, place, and time.  Skin: Skin is warm and dry. No rash noted.  Psychiatric: She has a normal mood and affect. Her behavior is normal.          Assessment & Plan:   Hypertension well controlled.  Will take atenolol at bedtime.   Otherwise, medical regimen unchanged Recurrent UTI.  Cephalexin, postcoital refilled.  This was prescribed by urology years ago and has been quite effective Dyslipidemia.  Continue statin therapy  CPX 6 months

## 2014-06-12 ENCOUNTER — Telehealth: Payer: Self-pay | Admitting: Internal Medicine

## 2014-06-12 NOTE — Telephone Encounter (Signed)
Pt called to say that she took 50 mg of atenolol (TENORMIN) 100 MG tablet fri night  And Sat her bp was 170/80 at 6:30  she took telmisa/hctz 80-12.5 she checked her bp an hour later and bp was 175/82. She decided she would take atenolol (TENORMIN) 100 MG tablet and her bp was 129/61. By 2:45 her bp was 150/77 and this morning at 6am it was 180/82. She would like a call back about this.

## 2014-06-12 NOTE — Telephone Encounter (Signed)
Noted  

## 2014-06-12 NOTE — Telephone Encounter (Signed)
Called and discussed.  We'll continue present regimen and observe over the next week or 2

## 2014-06-20 ENCOUNTER — Emergency Department (INDEPENDENT_AMBULATORY_CARE_PROVIDER_SITE_OTHER): Payer: Medicare Other

## 2014-06-20 ENCOUNTER — Emergency Department (HOSPITAL_COMMUNITY)
Admission: EM | Admit: 2014-06-20 | Discharge: 2014-06-20 | Disposition: A | Discharge status: Home / Self Care | Payer: Medicare Other | Source: Home / Self Care | Attending: Family Medicine | Admitting: Family Medicine

## 2014-06-20 ENCOUNTER — Encounter (HOSPITAL_COMMUNITY): Payer: Self-pay | Admitting: Emergency Medicine

## 2014-06-20 DIAGNOSIS — S63502A Unspecified sprain of left wrist, initial encounter: Secondary | ICD-10-CM

## 2014-06-20 DIAGNOSIS — S63509A Unspecified sprain of unspecified wrist, initial encounter: Secondary | ICD-10-CM

## 2014-06-20 DIAGNOSIS — R296 Repeated falls: Secondary | ICD-10-CM
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
## 2014-06-20 NOTE — Discharge Instructions (Signed)
Joint Sprain A sprain is a tear or stretch in the ligaments that hold a joint together. Severe sprains may need as long as 3-6 weeks of immobilization and/or exercises to heal completely. Sprained joints should be rested and protected. If not, they can become unstable and prone to re-injury. Proper treatment can reduce your pain, shorten the period of disability, and reduce the risk of repeated injuries. TREATMENT   Rest and elevate the injured joint to reduce pain and swelling.  Apply ice packs to the injury for 20-30 minutes every 2-3 hours for the next 2-3 days.  Keep the injury wrapped in a compression bandage or splint as long as the joint is painful or as instructed by your caregiver.  Do not use the injured joint until it is completely healed to prevent re-injury and chronic instability. Follow the instructions of your caregiver.  Long-term sprain management may require exercises and/or treatment by a physical therapist. Taping or special braces may help stabilize the joint until it is completely better. SEEK MEDICAL CARE IF:   You develop increased pain or swelling of the joint.  You develop increasing redness and warmth of the joint.  You develop a fever.  It becomes stiff.  Your hand or foot gets cold or numb. Document Released: 11/06/2004 Document Revised: 12/22/2011 Document Reviewed: 10/16/2008 [facility] Patient Information 2015 [place], [place]. This information is not intended to replace advice given to you by your health care provider. Make sure you discuss any questions you have with your health care provider.
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
## 2014-06-21 NOTE — ED Provider Notes (Signed)
Thumb spica splint used.    Gregor Hams, MD 06/21/14 [phone]

## 2014-07-25 ENCOUNTER — Ambulatory Visit (INDEPENDENT_AMBULATORY_CARE_PROVIDER_SITE_OTHER): Payer: Medicare Other

## 2014-07-25 DIAGNOSIS — Z23 Encounter for immunization: Secondary | ICD-10-CM

## 2014-07-28 ENCOUNTER — Ambulatory Visit: Payer: Medicare Other

## 2014-08-09 ENCOUNTER — Telehealth: Payer: Self-pay | Admitting: Internal Medicine

## 2014-08-09 MED ORDER — FAMOTIDINE 40 MG PO TABS: 40.0000 mg | ORAL_TABLET | Freq: Every day | ORAL | Status: DC

## 2014-08-09 NOTE — Telephone Encounter (Signed)
Rx sent 

## 2014-08-09 NOTE — Telephone Encounter (Signed)
[facility] (MAIL ORDER) [facility] - [place], [place] is requesting re-fill on famotidine (PEPCID) 40 MG tablet

## 2014-08-11 ENCOUNTER — Other Ambulatory Visit: Payer: Self-pay | Admitting: *Deleted

## 2014-08-11 DIAGNOSIS — Z853 Personal history of malignant neoplasm of breast: Secondary | ICD-10-CM

## 2014-08-14 ENCOUNTER — Ambulatory Visit (HOSPITAL_BASED_OUTPATIENT_CLINIC_OR_DEPARTMENT_OTHER): Payer: Medicare Other | Admitting: Hematology and Oncology

## 2014-08-14 ENCOUNTER — Telehealth: Payer: Self-pay | Admitting: Hematology and Oncology

## 2014-08-14 ENCOUNTER — Other Ambulatory Visit (HOSPITAL_BASED_OUTPATIENT_CLINIC_OR_DEPARTMENT_OTHER): Payer: Medicare Other

## 2014-08-14 DIAGNOSIS — C50211 Malignant neoplasm of upper-inner quadrant of right female breast: Secondary | ICD-10-CM

## 2014-08-14 DIAGNOSIS — C50811 Malignant neoplasm of overlapping sites of right female breast: Secondary | ICD-10-CM

## 2014-08-14 DIAGNOSIS — Z853 Personal history of malignant neoplasm of breast: Secondary | ICD-10-CM

## 2014-08-14 DIAGNOSIS — C773 Secondary and unspecified malignant neoplasm of axilla and upper limb lymph nodes: Secondary | ICD-10-CM

## 2014-08-14 DIAGNOSIS — M8580 Other specified disorders of bone density and structure, unspecified site: Secondary | ICD-10-CM

## 2014-08-14 LAB — COMPREHENSIVE METABOLIC PANEL (CC13)
ALT: 21 U/L (ref 0–55)
AST: 16 U/L (ref 5–34)
Albumin: 3.5 g/dL (ref 3.5–5.0)
Alkaline Phosphatase: 119 U/L (ref 40–150)
Anion Gap: 9 meq/L (ref 3–11)
BUN: 13.9 mg/dL (ref 7.0–26.0)
CO2: 30 meq/L — ABNORMAL HIGH (ref 22–29)
Calcium: 9.4 mg/dL (ref 8.4–10.4)
Chloride: 104 meq/L (ref 98–109)
Creatinine: 1.1 mg/dL (ref 0.6–1.1)
Glucose: 108 mg/dL (ref 70–140)
Potassium: 3.6 meq/L (ref 3.5–5.1)
Sodium: 143 meq/L (ref 136–145)
Total Bilirubin: 0.65 mg/dL (ref 0.20–1.20)
Total Protein: 7 g/dL (ref 6.4–8.3)

## 2014-08-14 LAB — CBC WITH DIFFERENTIAL/PLATELET
BASO%: 0.6 % (ref 0.0–2.0)
Basophils Absolute: 0 10*3/uL (ref 0.0–0.1)
EOS%: 1.3 % (ref 0.0–7.0)
Eosinophils Absolute: 0.1 10*3/uL (ref 0.0–0.5)
HCT: 39.9 % (ref 34.8–46.6)
HGB: 12.7 g/dL (ref 11.6–15.9)
LYMPH%: 29.5 % (ref 14.0–49.7)
MCH: 28 pg (ref 25.1–34.0)
MCHC: 31.8 g/dL (ref 31.5–36.0)
MCV: 88 fL (ref 79.5–101.0)
MONO#: 0.5 10*3/uL (ref 0.1–0.9)
MONO%: 8.6 % (ref 0.0–14.0)
NEUT#: 3.8 10*3/uL (ref 1.5–6.5)
NEUT%: 60 % (ref 38.4–76.8)
Platelets: 204 10*3/uL (ref 145–400)
RBC: 4.54 10*6/uL (ref 3.70–5.45)
RDW: 14.2 % (ref 11.2–14.5)
WBC: 6.3 10*3/uL (ref 3.9–10.3)
lymph#: 1.9 10*3/uL (ref 0.9–3.3)

## 2014-08-14 NOTE — Telephone Encounter (Signed)
, °
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
## 2014-10-11 ENCOUNTER — Telehealth: Payer: Self-pay | Admitting: Internal Medicine

## 2014-10-11 NOTE — Telephone Encounter (Signed)
As of Jan 1, pt  Will be getting all meds from [place]

## 2014-10-11 NOTE — Telephone Encounter (Signed)
Pt's pharmacy changed to [facility].

## 2014-10-19 ENCOUNTER — Encounter: Payer: Self-pay | Admitting: Internal Medicine

## 2014-10-19 ENCOUNTER — Ambulatory Visit (INDEPENDENT_AMBULATORY_CARE_PROVIDER_SITE_OTHER): Payer: Commercial Managed Care - HMO | Admitting: Internal Medicine

## 2014-10-19 VITALS — BP 130/76 | HR 60 | Temp 98.1°F | Resp 20 | Ht 66.0 in | Wt 188.0 lb

## 2014-10-19 DIAGNOSIS — M150 Primary generalized (osteo)arthritis: Secondary | ICD-10-CM

## 2014-10-19 DIAGNOSIS — I10 Essential (primary) hypertension: Secondary | ICD-10-CM

## 2014-10-19 DIAGNOSIS — E785 Hyperlipidemia, unspecified: Secondary | ICD-10-CM

## 2014-10-19 DIAGNOSIS — M8949 Other hypertrophic osteoarthropathy, multiple sites: Secondary | ICD-10-CM

## 2014-10-19 DIAGNOSIS — M159 Polyosteoarthritis, unspecified: Secondary | ICD-10-CM

## 2014-10-19 MED ORDER — OMEPRAZOLE 40 MG PO CPDR: 40.0000 mg | DELAYED_RELEASE_CAPSULE | Freq: Every day | ORAL | Status: DC

## 2014-10-19 NOTE — Progress Notes (Signed)
   Subjective:    Patient ID: Alexandra Calderon, female    DOB: 10-18-1940, 74 y.o.   MRN: [id number]  HPI  BP Readings from Last 3 Encounters:  10/19/14 130/76  08/14/14 138/69  06/20/14 170/78    Review of Systems     Objective:   Physical Exam        Assessment & Plan:

## 2014-10-19 NOTE — Patient Instructions (Signed)
Limit your sodium (Salt) intake    It is important that you exercise regularly, at least 20 minutes 3 to 4 times per week.  If you develop chest pain or shortness of breath seek  medical attention.  You need to lose weight.  Consider a lower calorie diet and regular exercise.  Avoids foods high in acid such as tomatoes citrus juices, and spicy foods.  Avoid eating within two hours of lying down or before exercising.  Do not overheat.  Try smaller more frequent meals.  If symptoms persist, elevate the head of her bed 12 inches while sleeping.  Return in 6 months for follow-up

## 2014-10-19 NOTE — Progress Notes (Signed)
Pre visit review using our clinic review tool, if applicable. No additional management support is needed unless otherwise documented below in the visit note. 

## 2014-10-19 NOTE — Progress Notes (Signed)
Subjective:    Patient ID: Alexandra Calderon, female    DOB: 1941-01-12, 74 y.o.   MRN: [id number]  HPI  74 year old patient who has treated hypertension.  She has some concerns due to occasional mildly elevated systolic readings.  Diastolic readings are all always quite low.  She generally feels well.  She has a history of dyslipidemia, osteoarthritis and a history colonic polyps.  She is followed by oncology.  Due to right breast cancer.  In general doing quite well except for some mild reflux symptoms refractory to Pepcid  Past Medical History  Diagnosis Date  . Hypertension   . Osteopenia   . PVC (premature ventricular contraction)   . Hyperlipidemia   . Arthritis   . GERD (gastroesophageal reflux disease)   . Diverticulosis   . Kidney stones   . Pneumonia   . UTI (lower urinary tract infection)   . Allergy   . Hearing loss   . Palpitations   . Breast cancer   . Colon adenomas [id number]  . Esophagitis   . Polyp, stomach   . Irritable bowel syndrome 08/27/2012    History   Social History  . Marital Status: Married    Spouse Name: N/A    Number of Children: 2  . Years of Education: N/A   Occupational History  . retired    Social History Main Topics  . Smoking status: Never Smoker   . Smokeless tobacco: Never Used  . Alcohol Use: No  . Drug Use: No  . Sexual Activity: Yes   Other Topics Concern  . Not on file   Social History Narrative   Spouse:  [name]   Children:   [name]- age 71- [place]- age 56- [name]    Past Surgical History  Procedure Laterality Date  . Kidney stones    . Dilation and curettage of uterus    . Shoulder arthroscopy rt 2009    . Shoulder surgery    . Breast lumpectomy  07/01/11    w/sentinal node bx - right side  . Colonoscopy w/ biopsies    . Esophagogastroduodenoscopy      Family History  Problem Relation Age of Onset  . Lymphoma Father   . Cancer Father   . Hodgkin's lymphoma Father   .  Diabetes Maternal Aunt   . Diabetes Paternal Grandmother   . Colon cancer Mother     Allergies  Allergen Reactions  . Levofloxacin     REACTION: rash  . Sulfamethoxazole-Trimethoprim Rash and Other (See Comments)    Chills, fever  . Sulfonamide Derivatives Rash and Other (See Comments)    Chills, fever    Current Outpatient Prescriptions on File Prior to Visit  Medication Sig Dispense Refill  . ALPRAZolam (XANAX) 0.25 MG tablet Take 1 tablet (0.25 mg total) by mouth at bedtime as needed for anxiety or sleep. 60 tablet 0  . anastrozole (ARIMIDEX) 1 MG tablet Take 1 tablet (1 mg total) by mouth daily. 90 tablet 6  . aspirin 81 MG tablet Take 81 mg by mouth at bedtime.    Marland Kitchen atenolol (TENORMIN) 100 MG tablet Take 1 tablet (100 mg total) by mouth daily. 90 tablet 3  . cephALEXin (KEFLEX) 500 MG capsule Take 1 capsule (500 mg total) by mouth as needed. 1 after sex 60 capsule 1  . cholecalciferol (VITAMIN D) 1000 UNITS tablet Take 2,000 Units by mouth daily.     . famotidine (PEPCID) 40  MG tablet Take 1 tablet (40 mg total) by mouth at bedtime. 90 tablet 3  . omega-3 acid ethyl esters (LOVAZA) 1 G capsule Take 1 g by mouth daily.    . simvastatin (ZOCOR) 20 MG tablet Take 1 tablet (20 mg total) by mouth daily. 90 tablet 3  . telmisartan-hydrochlorothiazide (MICARDIS HCT) 80-12.5 MG per tablet Take 1 tablet by mouth daily. 90 tablet 3   No current facility-administered medications on file prior to visit.    BP 130/76 mmHg  Pulse 60  Temp(Src) 98.1 F (36.7 C) (Oral)  Resp 20  Ht 5\' 6"  (1.676 m)  Wt 188 lb (85.276 kg)  BMI 30.36 kg/m2  SpO2 98%     Review of Systems  Constitutional: Negative.   HENT: Negative for congestion, dental problem, hearing loss, rhinorrhea, sinus pressure, sore throat and tinnitus.   Eyes: Negative for pain, discharge and visual disturbance.  Respiratory: Negative for cough and shortness of breath.   Cardiovascular: Negative for chest pain,  palpitations and leg swelling.  Gastrointestinal: Negative for nausea, vomiting, abdominal pain, diarrhea, constipation, blood in stool and abdominal distention.  Genitourinary: Negative for dysuria, urgency, frequency, hematuria, flank pain, vaginal bleeding, vaginal discharge, difficulty urinating, vaginal pain and pelvic pain.  Musculoskeletal: Negative for joint swelling, arthralgias and gait problem.  Skin: Negative for rash.  Neurological: Negative for dizziness, syncope, speech difficulty, weakness, numbness and headaches.  Hematological: Negative for adenopathy.  Psychiatric/Behavioral: Negative for behavioral problems, dysphoric mood and agitation. The patient is not nervous/anxious.        Objective:   Physical Exam  Constitutional: She is oriented to person, place, and time. She appears well-developed and well-nourished.  Blood pressure 146/72  HENT:  Head: Normocephalic.  Right Ear: External ear normal.  Left Ear: External ear normal.  Mouth/Throat: Oropharynx is clear and moist.  Eyes: Conjunctivae and EOM are normal. Pupils are equal, round, and reactive to light.  Neck: Normal range of motion. Neck supple. No thyromegaly present.  Cardiovascular: Normal rate, regular rhythm, normal heart sounds and intact distal pulses.   Pulmonary/Chest: Effort normal and breath sounds normal.  Abdominal: Soft. Bowel sounds are normal. She exhibits no mass. There is no tenderness.  Musculoskeletal: Normal range of motion.  Lymphadenopathy:    She has no cervical adenopathy.  Neurological: She is alert and oriented to person, place, and time.  Skin: Skin is warm and dry. No rash noted.  Psychiatric: She has a normal mood and affect. Her behavior is normal.          Assessment & Plan:   Hypertension.  Reasonable control.  Will continue present regimen.  Low-salt diet recommended exercise regimen.  Encouraged GERD.  Antireflux regimen.  Discussed.  She wishes to try  omeprazole Osteoarthritis, stable Dyslipidemia.  Continue simvastatin  CPX 6 months

## 2014-11-08 ENCOUNTER — Telehealth: Payer: Self-pay | Admitting: Internal Medicine

## 2014-11-08 MED ORDER — TELMISARTAN-HCTZ 80-12.5 MG PO TABS: 1.0000 | ORAL_TABLET | Freq: Every day | ORAL | Status: DC

## 2014-11-08 NOTE — Telephone Encounter (Signed)
Pt notified Rx sent to pharmacy

## 2014-11-08 NOTE — Telephone Encounter (Signed)
Pt needs new rx sent to [facility] telmisartan-hctz 80-12.5 #90 w/refills

## 2014-11-15 ENCOUNTER — Telehealth: Payer: Self-pay | Admitting: Internal Medicine

## 2014-11-15 NOTE — Telephone Encounter (Signed)
PA was denied.  Patient's plan covers irbesartan-HCTZ tablet and candesartan HCTZ tablet.

## 2014-11-15 NOTE — Telephone Encounter (Signed)
Pt needs a PA for her Micardis please. If that is not an option then Irbersartin/HCTZ would be cheaper for her. She only has a two week supply currently.

## 2014-11-15 NOTE — Telephone Encounter (Signed)
PA submitted.

## 2014-11-16 MED ORDER — IRBESARTAN-HYDROCHLOROTHIAZIDE 150-12.5 MG PO TABS: 1.0000 | ORAL_TABLET | Freq: Every day | ORAL | Status: DC

## 2014-11-16 NOTE — Telephone Encounter (Signed)
Spoke to pt, told her need to change blood pressure medication Micardis due to insurance will not cover. Rx changed to Irbesartan 150-12.5 mg one tablet daily. Rx sent to [facility] Pt verbalized understanding.

## 2014-11-16 NOTE — Telephone Encounter (Signed)
Irbesartan 150-12.5  #90 one daily

## 2014-11-16 NOTE — Telephone Encounter (Signed)
Please see message and advise 

## 2014-12-08 ENCOUNTER — Ambulatory Visit: Payer: Medicare Other | Admitting: Internal Medicine

## 2015-01-01 ENCOUNTER — Encounter: Payer: Self-pay | Admitting: Internal Medicine

## 2015-01-12 ENCOUNTER — Telehealth: Payer: Self-pay | Admitting: Internal Medicine

## 2015-01-12 DIAGNOSIS — Z860100 Personal history of colon polyps, unspecified: Secondary | ICD-10-CM

## 2015-01-12 DIAGNOSIS — C50211 Malignant neoplasm of upper-inner quadrant of right female breast: Secondary | ICD-10-CM

## 2015-01-12 NOTE — Telephone Encounter (Signed)
Pt called to ask for a referral to see her GI doctor. It is time for her 5 year colonoscopy check   Dr Carlean Purl

## 2015-01-12 NOTE — Telephone Encounter (Signed)
Pt also need a referral to the breast center for her 6 month check up

## 2015-01-12 NOTE — Telephone Encounter (Signed)
Order for Mammogram and referral to GI done.

## 2015-01-15 ENCOUNTER — Encounter: Payer: Self-pay | Admitting: Internal Medicine

## 2015-01-22 ENCOUNTER — Telehealth: Payer: Self-pay | Admitting: Internal Medicine

## 2015-01-22 MED ORDER — SIMVASTATIN 20 MG PO TABS: 20.0000 mg | ORAL_TABLET | Freq: Every day | ORAL | Status: DC

## 2015-01-22 NOTE — Telephone Encounter (Signed)
Needs refill for Simvastatin called to [facility] please

## 2015-01-22 NOTE — Telephone Encounter (Signed)
Left message Rx sent to pharmacy as requested.

## 2015-01-23 ENCOUNTER — Telehealth: Payer: Self-pay | Admitting: *Deleted

## 2015-01-23 DIAGNOSIS — C50211 Malignant neoplasm of upper-inner quadrant of right female breast: Secondary | ICD-10-CM

## 2015-01-23 MED ORDER — ANASTROZOLE 1 MG PO TABS: 1.0000 mg | ORAL_TABLET | Freq: Every day | ORAL | Status: DC

## 2015-01-23 NOTE — Telephone Encounter (Signed)
PRESCRIPTION CALLED TO PHARMACY. 

## 2015-02-06 ENCOUNTER — Other Ambulatory Visit: Payer: Self-pay | Admitting: Internal Medicine

## 2015-02-13 ENCOUNTER — Other Ambulatory Visit (HOSPITAL_BASED_OUTPATIENT_CLINIC_OR_DEPARTMENT_OTHER): Payer: Commercial Managed Care - HMO

## 2015-02-13 ENCOUNTER — Ambulatory Visit (HOSPITAL_BASED_OUTPATIENT_CLINIC_OR_DEPARTMENT_OTHER): Payer: Commercial Managed Care - HMO | Admitting: Hematology and Oncology

## 2015-02-13 ENCOUNTER — Telehealth: Payer: Self-pay | Admitting: Hematology and Oncology

## 2015-02-13 VITALS — BP 133/61 | HR 62 | Temp 98.0°F | Resp 18 | Ht 66.0 in | Wt 190.7 lb

## 2015-02-13 DIAGNOSIS — C50211 Malignant neoplasm of upper-inner quadrant of right female breast: Secondary | ICD-10-CM

## 2015-02-13 DIAGNOSIS — C50811 Malignant neoplasm of overlapping sites of right female breast: Secondary | ICD-10-CM | POA: Diagnosis not present

## 2015-02-13 LAB — COMPREHENSIVE METABOLIC PANEL (CC13)
ALT: 19 U/L (ref 0–55)
AST: 17 U/L (ref 5–34)
Albumin: 3.6 g/dL (ref 3.5–5.0)
Alkaline Phosphatase: 111 U/L (ref 40–150)
Anion Gap: 10 meq/L (ref 3–11)
BUN: 13.9 mg/dL (ref 7.0–26.0)
CO2: 30 meq/L — ABNORMAL HIGH (ref 22–29)
Calcium: 9.2 mg/dL (ref 8.4–10.4)
Chloride: 102 meq/L (ref 98–109)
Creatinine: 0.8 mg/dL (ref 0.6–1.1)
EGFR: 70 ml/min/1.73 m2 — ABNORMAL LOW
Glucose: 107 mg/dL (ref 70–140)
Potassium: 3.6 meq/L (ref 3.5–5.1)
Sodium: 142 meq/L (ref 136–145)
Total Bilirubin: 0.63 mg/dL (ref 0.20–1.20)
Total Protein: 6.9 g/dL (ref 6.4–8.3)

## 2015-02-13 LAB — CBC WITH DIFFERENTIAL/PLATELET
BASO%: 0.2 % (ref 0.0–2.0)
Basophils Absolute: 0 10*3/uL (ref 0.0–0.1)
EOS%: 1.4 % (ref 0.0–7.0)
Eosinophils Absolute: 0.1 10*3/uL (ref 0.0–0.5)
HCT: 39.5 % (ref 34.8–46.6)
HGB: 13.3 g/dL (ref 11.6–15.9)
LYMPH%: 37.9 % (ref 14.0–49.7)
MCH: 29.4 pg (ref 25.1–34.0)
MCHC: 33.7 g/dL (ref 31.5–36.0)
MCV: 87.4 fL (ref 79.5–101.0)
MONO#: 0.5 10*3/uL (ref 0.1–0.9)
MONO%: 10.1 % (ref 0.0–14.0)
NEUT#: 2.6 10*3/uL (ref 1.5–6.5)
NEUT%: 50.4 % (ref 38.4–76.8)
Platelets: 208 10*3/uL (ref 145–400)
RBC: 4.52 10*6/uL (ref 3.70–5.45)
RDW: 14 % (ref 11.2–14.5)
WBC: 5.2 10*3/uL (ref 3.9–10.3)
lymph#: 2 10*3/uL (ref 0.9–3.3)
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
## 2015-02-13 NOTE — Telephone Encounter (Signed)
Gave avs & calendar for May 2017 °
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
## 2015-02-27 ENCOUNTER — Telehealth: Payer: Self-pay | Admitting: Internal Medicine

## 2015-02-27 MED ORDER — ATENOLOL 100 MG PO TABS: 100.0000 mg | ORAL_TABLET | Freq: Every day | ORAL | Status: DC

## 2015-02-27 NOTE — Telephone Encounter (Signed)
Pt request refill atenolol (TENORMIN) 100 MG tablet 90 day  Pt has switched insurance and needs new rx sent to [facility]  Pt has cpe 7/7/1k

## 2015-02-27 NOTE — Telephone Encounter (Signed)
Pt notified Rx sent to [facility].

## 2015-02-28 ENCOUNTER — Ambulatory Visit (AMBULATORY_SURGERY_CENTER): Payer: Self-pay | Admitting: *Deleted

## 2015-02-28 VITALS — Ht 66.0 in | Wt 190.8 lb

## 2015-02-28 DIAGNOSIS — Z860100 Personal history of colon polyps, unspecified: Secondary | ICD-10-CM

## 2015-02-28 DIAGNOSIS — Z800 Family history of malignant neoplasm of digestive organs: Secondary | ICD-10-CM

## 2015-02-28 NOTE — Progress Notes (Signed)
No egg or soy allergy No problems with sedation No diet pills No home 02 use

## 2015-03-06 ENCOUNTER — Telehealth: Payer: Self-pay | Admitting: Internal Medicine

## 2015-03-06 NOTE — Telephone Encounter (Signed)
Spoke with pt.Reviewed her prep instructions and answered all her questions.She will call back if she has any further questions.Gwyndolyn Saxon

## 2015-03-14 ENCOUNTER — Encounter: Payer: Self-pay | Admitting: Internal Medicine

## 2015-03-14 ENCOUNTER — Encounter: Payer: Commercial Managed Care - HMO | Admitting: Internal Medicine

## 2015-03-14 ENCOUNTER — Ambulatory Visit (AMBULATORY_SURGERY_CENTER): Payer: Commercial Managed Care - HMO | Admitting: Internal Medicine

## 2015-03-14 VITALS — BP 184/76 | HR 57 | Temp 97.5°F | Resp 16 | Ht 66.0 in | Wt 190.0 lb

## 2015-03-14 DIAGNOSIS — D123 Benign neoplasm of transverse colon: Secondary | ICD-10-CM

## 2015-03-14 DIAGNOSIS — Z860100 Personal history of colon polyps, unspecified: Secondary | ICD-10-CM

## 2015-03-14 DIAGNOSIS — D122 Benign neoplasm of ascending colon: Secondary | ICD-10-CM | POA: Diagnosis not present

## 2015-03-14 MED ORDER — SODIUM CHLORIDE 0.9 % IV SOLN: 500.0000 mL | INTRAVENOUS | Status: DC

## 2015-03-14 NOTE — Patient Instructions (Addendum)
I found and removed 2 small polyps that look benign. Colon polyp handout given. You should be considered to have a colonoscopy in 5 years if doing well then. I will let you know pathology results by mail.  I appreciate the opportunity to care for you. Gatha Mayer, MD, FACG  YOU HAD AN ENDOSCOPIC PROCEDURE TODAY AT [facility]:   Refer to the procedure report that was given to you for any specific questions about what was found during the examination.  If the procedure report does not answer your questions, please call your gastroenterologist to clarify.  If you requested that your care partner not be given the details of your procedure findings, then the procedure report has been included in a sealed envelope for you to review at your convenience later.  YOU SHOULD EXPECT: Some feelings of bloating in the abdomen. Passage of more gas than usual.  Walking can help get rid of the air that was put into your GI tract during the procedure and reduce the bloating. If you had a lower endoscopy (such as a colonoscopy or flexible sigmoidoscopy) you may notice spotting of blood in your stool or on the toilet paper. If you underwent a bowel prep for your procedure, you may not have a normal bowel movement for a few days.  Please Note:  You might notice some irritation and congestion in your nose or some drainage.  This is from the oxygen used during your procedure.  There is no need for concern and it should clear up in a day or so.  SYMPTOMS TO REPORT IMMEDIATELY:   Following lower endoscopy (colonoscopy or flexible sigmoidoscopy):  Excessive amounts of blood in the stool  Significant tenderness or worsening of abdominal pains  Swelling of the abdomen that is new, acute  Fever of 100F or higher   For urgent or emergent issues, a gastroenterologist can be reached at any hour by calling [phone].   DIET: Your first meal following the procedure should be a small meal  and then it is ok to progress to your normal diet. Heavy or fried foods are harder to digest and may make you feel nauseous or bloated.  Likewise, meals heavy in dairy and vegetables can increase bloating.  Drink plenty of fluids but you should avoid alcoholic beverages for 24 hours.  ACTIVITY:  You should plan to take it easy for the rest of today and you should NOT DRIVE or use heavy machinery until tomorrow (because of the sedation medicines used during the test).    FOLLOW UP: Our staff will call the number listed on your records the next business day following your procedure to check on you and address any questions or concerns that you may have regarding the information given to you following your procedure. If we do not reach you, we will leave a message.  However, if you are feeling well and you are not experiencing any problems, there is no need to return our call.  We will assume that you have returned to your regular daily activities without incident.  If any biopsies were taken you will be contacted by phone or by letter within the next 1-3 weeks.  Please call us at [phone] if you have not heard about the biopsies in 3 weeks.    SIGNATURES/CONFIDENTIALITY: You and/or your care partner have signed paperwork which will be entered into your electronic medical record.  These signatures attest to the fact that that the  information above on your After Visit Summary has been reviewed and is understood.  Full responsibility of the confidentiality of this discharge information lies with you and/or your care-partner.

## 2015-03-14 NOTE — Op Note (Addendum)
[facility]  [address] [place], [id number]   COLONOSCOPY PROCEDURE REPORT  PATIENT: Alexandra Calderon, Alexandra Calderon  MR#: [id number] BIRTHDATE: 08-31-1941 , 74  yrs. old GENDER: female ENDOSCOPIST: Gatha Mayer, MD, [facility] PROCEDURE DATE:  03/14/2015 PROCEDURE:   Colonoscopy, surveillance and Colonoscopy with snare polypectomy First Screening Colonoscopy - Avg.  risk and is 50 yrs.  old or older - No.  Prior Negative Screening - Now for repeat screening. N/A  History of Adenoma - Now for follow-up colonoscopy & has been > or = to 3 yrs.  Yes hx of adenoma.  Has been 3 or more years since last colonoscopy.  Polyps removed today? Yes ASA CLASS:   Class II INDICATIONS:Surveillance due to prior colonic neoplasia, FH Colon or Rectal Adenocarcinoma, and PH Colon Adenoma. MEDICATIONS: Propofol 250 mg IV and Monitored anesthesia care  DESCRIPTION OF PROCEDURE:   After the risks benefits and alternatives of the procedure were thoroughly explained, informed consent was obtained.  The digital rectal exam revealed no abnormalities of the rectum.   The LB [id number]  endoscope was introduced through the anus and advanced to the cecum, which was identified by both the appendix and ileocecal valve. No adverse events experienced.   The quality of the prep was (MiraLax was used) good.  The instrument was then slowly withdrawn as the colon was fully examined. Estimated blood loss is zero unless otherwise noted in this procedure report.  COLON FINDINGS: Two sessile polyps ranging from 3 to 55mm in size were found in the transverse colon and ascending colon. Polypectomies were performed with a cold snare.  The resection was complete, the polyp tissue was completely retrieved and sent to histology.   The examination was otherwise normal.  Retroflexed views revealed no abnormalities. The time to cecum = 2.0 Withdrawal time = 9.9   The scope was withdrawn and the procedure  completed. COMPLICATIONS: There were no immediate complications.  ENDOSCOPIC IMPRESSION: 1.   Two sessile polyps ranging from 3 to 75mm in size were found in the transverse colon and ascending colon; polypectomies were performed with a cold snare 2.   The examination was otherwise normal  RECOMMENDATIONS: Timing of possible repeat colonoscopy will be determined by pathology findings.  Now 35 and hoping not to repeat a colonoscopy. Does also have FHx CRCA mother and prior adenomas 2003 and 2006.  eSigned:  Gatha Mayer, MD, [facility] 03/14/2015 3:18 PM   cc: The Patient

## 2015-03-14 NOTE — Progress Notes (Signed)
Called to room to assist during endoscopic procedure.  Patient ID and intended procedure confirmed with present staff. Received instructions for my participation in the procedure from the performing physician.  

## 2015-03-14 NOTE — Progress Notes (Signed)
Patient awakening,vss,report to rn 

## 2015-03-15 ENCOUNTER — Telehealth: Payer: Self-pay | Admitting: *Deleted

## 2015-03-15 NOTE — Telephone Encounter (Signed)
  Follow up Call-  Call back number 03/14/2015  Post procedure Call Back phone  # [phone]  Permission to leave phone message Yes     Patient questions:  Do you have a fever, pain , or abdominal swelling? No. Pain Score  0 *  Have you tolerated food without any problems? Yes.    Have you been able to return to your normal activities? Yes.    Do you have any questions about your discharge instructions: Diet   No. Medications  No. Follow up visit  No.  Do you have questions or concerns about your Care? No.  Actions: * If pain score is 4 or above: No action needed, pain <4.

## 2015-03-20 ENCOUNTER — Encounter: Payer: Self-pay | Admitting: Internal Medicine

## 2015-03-20 DIAGNOSIS — Z860100 Personal history of colon polyps, unspecified: Secondary | ICD-10-CM

## 2015-03-20 NOTE — Progress Notes (Signed)
Quick Note:  2 small adenomas Consider repeat colonoscopy 2021 (recall placed) ______

## 2015-04-02 ENCOUNTER — Other Ambulatory Visit: Payer: Self-pay | Admitting: Internal Medicine

## 2015-04-09 ENCOUNTER — Other Ambulatory Visit: Payer: Self-pay

## 2015-04-12 ENCOUNTER — Other Ambulatory Visit (INDEPENDENT_AMBULATORY_CARE_PROVIDER_SITE_OTHER): Payer: Commercial Managed Care - HMO

## 2015-04-12 DIAGNOSIS — Z0000 Encounter for general adult medical examination without abnormal findings: Secondary | ICD-10-CM

## 2015-04-12 LAB — CBC WITH DIFFERENTIAL/PLATELET
Basophils Absolute: 0 10*3/uL (ref 0.0–0.1)
Basophils Relative: 0.4 % (ref 0.0–3.0)
Eosinophils Absolute: 0.1 10*3/uL (ref 0.0–0.7)
Eosinophils Relative: 1.9 % (ref 0.0–5.0)
HCT: 39.7 % (ref 36.0–46.0)
Hemoglobin: 13.2 g/dL (ref 12.0–15.0)
Lymphocytes Relative: 33.3 % (ref 12.0–46.0)
Lymphs Abs: 1.4 10*3/uL (ref 0.7–4.0)
MCHC: 33.3 g/dL (ref 30.0–36.0)
MCV: 87.5 fl (ref 78.0–100.0)
Monocytes Absolute: 0.4 10*3/uL (ref 0.1–1.0)
Monocytes Relative: 10.1 % (ref 3.0–12.0)
Neutro Abs: 2.3 10*3/uL (ref 1.4–7.7)
Neutrophils Relative %: 54.3 % (ref 43.0–77.0)
Platelets: 193 10*3/uL (ref 150.0–400.0)
RBC: 4.54 Mil/uL (ref 3.87–5.11)
RDW: 14.8 % (ref 11.5–15.5)
WBC: 4.3 10*3/uL (ref 4.0–10.5)

## 2015-04-12 LAB — POCT URINALYSIS DIPSTICK
Bilirubin, UA: NEGATIVE
Glucose, UA: NEGATIVE
Ketones, UA: NEGATIVE
Leukocytes, UA: NEGATIVE
Nitrite, UA: NEGATIVE
Protein, UA: NEGATIVE
Spec Grav, UA: 1.015
Urobilinogen, UA: 0.2
pH, UA: 5.5

## 2015-04-12 LAB — HEPATIC FUNCTION PANEL
ALT: 18 U/L (ref 0–35)
AST: 17 U/L (ref 0–37)
Albumin: 3.9 g/dL (ref 3.5–5.2)
Alkaline Phosphatase: 103 U/L (ref 39–117)
Bilirubin, Direct: 0.1 mg/dL (ref 0.0–0.3)
Total Bilirubin: 0.7 mg/dL (ref 0.2–1.2)
Total Protein: 7.2 g/dL (ref 6.0–8.3)

## 2015-04-12 LAB — BASIC METABOLIC PANEL WITH GFR
BUN: 14 mg/dL (ref 6–23)
CO2: 34 meq/L — ABNORMAL HIGH (ref 19–32)
Calcium: 9.4 mg/dL (ref 8.4–10.5)
Chloride: 105 meq/L (ref 96–112)
Creatinine, Ser: 0.93 mg/dL (ref 0.40–1.20)
GFR: 62.95 mL/min
Glucose, Bld: 99 mg/dL (ref 70–99)
Potassium: 4.3 meq/L (ref 3.5–5.1)
Sodium: 145 meq/L (ref 135–145)

## 2015-04-12 LAB — LIPID PANEL
Cholesterol: 152 mg/dL (ref 0–200)
HDL: 36.5 mg/dL — ABNORMAL LOW
LDL Cholesterol: 94 mg/dL (ref 0–99)
NonHDL: 115.5
Total CHOL/HDL Ratio: 4
Triglycerides: 108 mg/dL (ref 0.0–149.0)
VLDL: 21.6 mg/dL (ref 0.0–40.0)

## 2015-04-12 LAB — TSH: TSH: 3.66 u[IU]/mL (ref 0.35–4.50)

## 2015-04-19 ENCOUNTER — Ambulatory Visit (INDEPENDENT_AMBULATORY_CARE_PROVIDER_SITE_OTHER): Payer: Commercial Managed Care - HMO | Admitting: Internal Medicine

## 2015-04-19 ENCOUNTER — Encounter: Payer: Self-pay | Admitting: Internal Medicine

## 2015-04-19 VITALS — BP 136/80 | HR 59 | Temp 98.4°F | Resp 18 | Ht 66.0 in | Wt 190.0 lb

## 2015-04-19 DIAGNOSIS — Z860100 Personal history of colon polyps, unspecified: Secondary | ICD-10-CM

## 2015-04-19 DIAGNOSIS — C50211 Malignant neoplasm of upper-inner quadrant of right female breast: Secondary | ICD-10-CM

## 2015-04-19 DIAGNOSIS — Z23 Encounter for immunization: Secondary | ICD-10-CM | POA: Diagnosis not present

## 2015-04-19 DIAGNOSIS — E785 Hyperlipidemia, unspecified: Secondary | ICD-10-CM

## 2015-04-19 DIAGNOSIS — M8949 Other hypertrophic osteoarthropathy, multiple sites: Secondary | ICD-10-CM

## 2015-04-19 DIAGNOSIS — Z0000 Encounter for general adult medical examination without abnormal findings: Secondary | ICD-10-CM | POA: Diagnosis not present

## 2015-04-19 DIAGNOSIS — M159 Polyosteoarthritis, unspecified: Secondary | ICD-10-CM

## 2015-04-19 DIAGNOSIS — I10 Essential (primary) hypertension: Secondary | ICD-10-CM

## 2015-04-19 DIAGNOSIS — M150 Primary generalized (osteo)arthritis: Secondary | ICD-10-CM

## 2015-04-19 MED ORDER — ATENOLOL 100 MG PO TABS: 100.0000 mg | ORAL_TABLET | Freq: Every day | ORAL | Status: DC

## 2015-04-19 MED ORDER — CEPHALEXIN 500 MG PO CAPS: 500.0000 mg | ORAL_CAPSULE | ORAL | Status: DC | PRN

## 2015-04-19 MED ORDER — ALPRAZOLAM 0.25 MG PO TABS: 0.2500 mg | ORAL_TABLET | Freq: Every evening | ORAL | Status: DC | PRN

## 2015-04-19 NOTE — Progress Notes (Signed)
Pre visit review using our clinic review tool, if applicable. No additional management support is needed unless otherwise documented below in the visit note. 

## 2015-04-19 NOTE — Addendum Note (Signed)
Addended by: Marian Sorrow on: 04/19/2015 12:01 PM   Modules accepted: Orders

## 2015-04-19 NOTE — Progress Notes (Signed)
Subjective:    Patient ID: Alexandra Calderon, female    DOB: Jul 05, 1941, 74 y.o.   MRN: [id number]  HPI  74 year old patient who is seen today for a Medicare wellness visit.  Doing quite well.  She has had a recent follow-up colonoscopy last month.  Is followed closely by oncology.  Due to a history of right breast cancer.  Is scheduled for follow-up mammogram next month. She has hypertension which has been stable.  Past Medical History  Diagnosis Date  . Hypertension   . Osteopenia   . PVC (premature ventricular contraction)   . Hyperlipidemia   . Arthritis   . GERD (gastroesophageal reflux disease)   . Diverticulosis   . Kidney stones   . Pneumonia   . UTI (lower urinary tract infection)   . Allergy   . Hearing loss   . Palpitations   . Colon adenomas 1308,6578  . Esophagitis   . Polyp, stomach   . Irritable bowel syndrome 08/27/2012  . Breast cancer     right  . Constipation     History   Social History  . Marital Status: Married    Spouse Name: N/A  . Number of Children: 2  . Years of Education: N/A   Occupational History  . retired    Social History Main Topics  . Smoking status: Never Smoker   . Smokeless tobacco: Never Used  . Alcohol Use: No  . Drug Use: No  . Sexual Activity: Yes   Other Topics Concern  . Not on file   Social History Narrative   Spouse:  Alexandra Calderon   Children:   Alexandra Calderon- age 27- [place]- age 72- Alexandra Calderon    Past Surgical History  Procedure Laterality Date  . Kidney stones    . Dilation and curettage of uterus    . Shoulder arthroscopy rt 2009    . Shoulder surgery    . Breast lumpectomy  07/01/11    w/sentinal node bx - right side  . Colonoscopy w/ biopsies    . Esophagogastroduodenoscopy      Family History  Problem Relation Age of Onset  . Lymphoma Father   . Cancer Father   . Hodgkin's lymphoma Father   . Diabetes Maternal Aunt   . Diabetes Paternal Grandmother   . Colon cancer Mother   .  Colon polyps Neg Hx   . Rectal cancer Neg Hx   . Stomach cancer Neg Hx     Allergies  Allergen Reactions  . Levofloxacin     REACTION: rash  . Sulfamethoxazole-Trimethoprim Rash and Other (See Comments)    Chills, fever  . Sulfonamide Derivatives Rash and Other (See Comments)    Chills, fever    Current Outpatient Prescriptions on File Prior to Visit  Medication Sig Dispense Refill  . anastrozole (ARIMIDEX) 1 MG tablet Take 1 tablet (1 mg total) by mouth daily. 90 tablet 0  . aspirin 81 MG tablet Take 81 mg by mouth at bedtime.    . calcium carbonate (TITRALAC) 420 MG CHEW chewable tablet Chew 420 mg by mouth daily.    . cholecalciferol (VITAMIN D) 1000 UNITS tablet Take 2,000 Units by mouth daily.     . Fish Oil-Cholecalciferol (FISH OIL + D3 PO) Take 1 capsule by mouth daily.    . irbesartan-hydrochlorothiazide (AVALIDE) 150-12.5 MG per tablet TAKE 1 TABLET EVERY DAY 90 tablet 1  . omega-3 acid ethyl esters (LOVAZA) 1 G capsule  Take 1 g by mouth daily.    Marland Kitchen omeprazole (PRILOSEC) 40 MG capsule TAKE 1 CAPSULE DAILY 90 capsule 3  . OVER THE COUNTER MEDICATION Take 1 tablet by mouth daily. [employer] stool softener daily    . simvastatin (ZOCOR) 20 MG tablet Take 1 tablet (20 mg total) by mouth daily. 90 tablet 1   No current facility-administered medications on file prior to visit.    BP 136/80 mmHg  Pulse 59  Temp(Src) 98.4 F (36.9 C) (Oral)  Resp 18  Ht 5\' 6"  (1.676 m)  Wt 190 lb (86.183 kg)  BMI 30.68 kg/m2  SpO2 97%    1. Risk factors, based on past  M,S,F history.  Current vascular risk factors include a history of hypertension and dyslipidemia  2.  Physical activities: No activity restrictions, although fairly sedentary  3.  Depression/mood: No history depression or mood disorder   4.  Hearing: Complains of tinnitus and very mild hearing deficit   5.  ADL's: Independent in all aspects of daily living  6.  Fall risk: Low  7.  Home safety: No problems  identified  8.  Height weight, and visual acuity; height and weight stable no change in visual acuity  9.  Counseling: Modest weight loss heart healthy diet and regular exercise.  All encouraged  10. Lab orders based on risk factors: Laboratory profile reviewed  11. Referral : Follow-up oncology  12. Care plan: Heart healthy diet and aggressive risk factor modification discussed  13. Cognitive assessment: Alert and oriented with normal affect no cognitive dysfunction  14. Screening: Patient provided with a written and personalized 5-10 year screening schedule in the AVS.  patient will have a colonoscopy at five-year intervals.  Due to a history of colonic polyps.  Due to a history of breast cancer.  Will have annual mammograms.  Annual examinations.  Encouraged, as well as annual clinical exams with screening lab  15. Provider List Update: Primary care medicine ophthalmology oncology and GI  Review of Systems  Constitutional: Negative for fever, appetite change, fatigue and unexpected weight change.  HENT: Negative for congestion, dental problem, ear pain, hearing loss, mouth sores, nosebleeds, sinus pressure, sore throat, tinnitus, trouble swallowing and voice change.   Eyes: Negative for photophobia, pain, redness and visual disturbance.  Respiratory: Negative for cough, chest tightness and shortness of breath.   Cardiovascular: Negative for chest pain, palpitations and leg swelling.  Gastrointestinal: Negative for nausea, vomiting, abdominal pain, diarrhea, constipation, blood in stool, abdominal distention and rectal pain.  Genitourinary: Negative for dysuria, urgency, frequency, hematuria, flank pain, vaginal bleeding, vaginal discharge, difficulty urinating, genital sores, vaginal pain, menstrual problem and pelvic pain.  Musculoskeletal: Positive for neck pain. Negative for back pain, arthralgias and neck stiffness.  Skin: Negative for rash.  Neurological: Negative for dizziness,  syncope, speech difficulty, weakness, light-headedness, numbness and headaches.  Hematological: Negative for adenopathy. Does not bruise/bleed easily.  Psychiatric/Behavioral: Negative for suicidal ideas, behavioral problems, self-injury, dysphoric mood and agitation. The patient is not nervous/anxious.        Objective:   Physical Exam  Constitutional: She is oriented to person, place, and time. She appears well-developed and well-nourished.  HENT:  Head: Normocephalic and atraumatic.  Right Ear: External ear normal.  Left Ear: External ear normal.  Mouth/Throat: Oropharynx is clear and moist.  Eyes: Conjunctivae and EOM are normal.  Neck: Normal range of motion. Neck supple. No JVD present. No thyromegaly present.  Cardiovascular: Normal rate, regular rhythm, normal  heart sounds and intact distal pulses.   No murmur heard. Pulmonary/Chest: Effort normal and breath sounds normal. She has no wheezes. She has no rales.  Abdominal: Soft. Bowel sounds are normal. She exhibits no distension and no mass. There is no tenderness. There is no rebound and no guarding.  Musculoskeletal: Normal range of motion. She exhibits no edema or tenderness.  Neurological: She is alert and oriented to person, place, and time. She has normal reflexes. No cranial nerve deficit. She exhibits normal muscle tone. Coordination normal.  Skin: Skin is warm and dry. No rash noted.  Psychiatric: She has a normal mood and affect. Her behavior is normal.          Assessment & Plan:   Preventive health exam Essential hypertension, stable Dyslipidemia.  Continue statin therapy History colonic polyps.  Follow-up colonoscopy in 5 years History of right breast cancer.  Follow-up oncology.  Continue annual mammograms  Recheck 6 months Medications updated

## 2015-04-19 NOTE — Patient Instructions (Addendum)
Limit your sodium (Salt) intake    It is important that you exercise regularly, at least 20 minutes 3 to 4 times per week.  If you develop chest pain or shortness of breath seek  medical attention.  Return in 6 months for follow-up  Health Maintenance Adopting a healthy lifestyle and getting preventive care can go a long way to promote health and wellness. Talk with your health care provider about what schedule of regular examinations is right for you. This is a good chance for you to check in with your provider about disease prevention and staying healthy. In between checkups, there are plenty of things you can do on your own. Experts have done a lot of research about which lifestyle changes and preventive measures are most likely to keep you healthy. Ask your health care provider for more information. WEIGHT AND DIET  Eat a healthy diet  Be sure to include plenty of vegetables, fruits, low-fat dairy products, and lean protein.  Do not eat a lot of foods high in solid fats, added sugars, or salt.  Get regular exercise. This is one of the most important things you can do for your health.  Most adults should exercise for at least 150 minutes each week. The exercise should increase your heart rate and make you sweat (moderate-intensity exercise).  Most adults should also do strengthening exercises at least twice a week. This is in addition to the moderate-intensity exercise.  Maintain a healthy weight  Body mass index (BMI) is a measurement that can be used to identify possible weight problems. It estimates body fat based on height and weight. Your health care provider can help determine your BMI and help you achieve or maintain a healthy weight.  For females 20 years of age and older:   A BMI below 18.5 is considered underweight.  A BMI of 18.5 to 24.9 is normal.  A BMI of 25 to 29.9 is considered overweight.  A BMI of 30 and above is considered obese.  Watch levels of cholesterol  and blood lipids  You should start having your blood tested for lipids and cholesterol at 72 years of age, then have this test every 5 years.  You may need to have your cholesterol levels checked more often if:  Your lipid or cholesterol levels are high.  You are older than 72 years of age.  You are at high risk for heart disease.  CANCER SCREENING   Lung Cancer  Lung cancer screening is recommended for adults 55-80 years old who are at high risk for lung cancer because of a history of smoking.  A yearly low-dose CT scan of the lungs is recommended for people who:  Currently smoke.  Have quit within the past 15 years.  Have at least a 30-pack-year history of smoking. A pack year is smoking an average of one pack of cigarettes a day for 1 year.  Yearly screening should continue until it has been 15 years since you quit.  Yearly screening should stop if you develop a health problem that would prevent you from having lung cancer treatment.  Breast Cancer  Practice breast self-awareness. This means understanding how your breasts normally appear and feel.  It also means doing regular breast self-exams. Let your health care provider know about any changes, no matter how small.  If you are in your 20s or 30s, you should have a clinical breast exam (CBE) by a health care provider every 1-3 years as part of a   regular health exam.  If you are 40 or older, have a CBE every year. Also consider having a breast X-ray (mammogram) every year.  If you have a family history of breast cancer, talk to your health care provider about genetic screening.  If you are at high risk for breast cancer, talk to your health care provider about having an MRI and a mammogram every year.  Breast cancer gene (BRCA) assessment is recommended for women who have family members with BRCA-related cancers. BRCA-related cancers include:  Breast.  Ovarian.  Tubal.  Peritoneal cancers.  Results of the  assessment will determine the need for genetic counseling and BRCA1 and BRCA2 testing. Cervical Cancer Routine pelvic examinations to screen for cervical cancer are no longer recommended for nonpregnant women who are considered low risk for cancer of the pelvic organs (ovaries, uterus, and vagina) and who do not have symptoms. A pelvic examination may be necessary if you have symptoms including those associated with pelvic infections. Ask your health care provider if a screening pelvic exam is right for you.   The Pap test is the screening test for cervical cancer for women who are considered at risk.  If you had a hysterectomy for a problem that was not cancer or a condition that could lead to cancer, then you no longer need Pap tests.  If you are older than 65 years, and you have had normal Pap tests for the past 10 years, you no longer need to have Pap tests.  If you have had past treatment for cervical cancer or a condition that could lead to cancer, you need Pap tests and screening for cancer for at least 20 years after your treatment.  If you no longer get a Pap test, assess your risk factors if they change (such as having a new sexual partner). This can affect whether you should start being screened again.  Some women have medical problems that increase their chance of getting cervical cancer. If this is the case for you, your health care provider may recommend more frequent screening and Pap tests.  The human papillomavirus (HPV) test is another test that may be used for cervical cancer screening. The HPV test looks for the virus that can cause cell changes in the cervix. The cells collected during the Pap test can be tested for HPV.  The HPV test can be used to screen women 30 years of age and older. Getting tested for HPV can extend the interval between normal Pap tests from three to five years.  An HPV test also should be used to screen women of any age who have unclear Pap test  results.  After 72 years of age, women should have HPV testing as often as Pap tests.  Colorectal Cancer  This type of cancer can be detected and often prevented.  Routine colorectal cancer screening usually begins at 72 years of age and continues through 72 years of age.  Your health care provider may recommend screening at an earlier age if you have risk factors for colon cancer.  Your health care provider may also recommend using home test kits to check for hidden blood in the stool.  A small camera at the end of a tube can be used to examine your colon directly (sigmoidoscopy or colonoscopy). This is done to check for the earliest forms of colorectal cancer.  Routine screening usually begins at age 50.  Direct examination of the colon should be repeated every 5-10 years through 75   years of age. However, you may need to be screened more often if early forms of precancerous polyps or small growths are found. Skin Cancer  Check your skin from head to toe regularly.  Tell your health care provider about any new moles or changes in moles, especially if there is a change in a mole's shape or color.  Also tell your health care provider if you have a mole that is larger than the size of a pencil eraser.  Always use sunscreen. Apply sunscreen liberally and repeatedly throughout the day.  Protect yourself by wearing long sleeves, pants, a wide-brimmed hat, and sunglasses whenever you are outside. HEART DISEASE, DIABETES, AND HIGH BLOOD PRESSURE   Have your blood pressure checked at least every 1-2 years. High blood pressure causes heart disease and increases the risk of stroke.  If you are between 55 years and 79 years old, ask your health care provider if you should take aspirin to prevent strokes.  Have regular diabetes screenings. This involves taking a blood sample to check your fasting blood sugar level.  If you are at a normal weight and have a low risk for diabetes, have this  test once every three years after 72 years of age.  If you are overweight and have a high risk for diabetes, consider being tested at a younger age or more often. PREVENTING INFECTION  Hepatitis B  If you have a higher risk for hepatitis B, you should be screened for this virus. You are considered at high risk for hepatitis B if:  You were born in a country where hepatitis B is common. Ask your health care provider which countries are considered high risk.  Your parents were born in a high-risk country, and you have not been immunized against hepatitis B (hepatitis B vaccine).  You have HIV or AIDS.  You use needles to inject street drugs.  You live with someone who has hepatitis B.  You have had sex with someone who has hepatitis B.  You get hemodialysis treatment.  You take certain medicines for conditions, including cancer, organ transplantation, and autoimmune conditions. Hepatitis C  Blood testing is recommended for:  Everyone born from 1945 through 1965.  Anyone with known risk factors for hepatitis C. Sexually transmitted infections (STIs)  You should be screened for sexually transmitted infections (STIs) including gonorrhea and chlamydia if:  You are sexually active and are younger than 72 years of age.  You are older than 72 years of age and your health care provider tells you that you are at risk for this type of infection.  Your sexual activity has changed since you were last screened and you are at an increased risk for chlamydia or gonorrhea. Ask your health care provider if you are at risk.  If you do not have HIV, but are at risk, it may be recommended that you take a prescription medicine daily to prevent HIV infection. This is called pre-exposure prophylaxis (PrEP). You are considered at risk if:  You are sexually active and do not regularly use condoms or know the HIV status of your partner(s).  You take drugs by injection.  You are sexually active with  a partner who has HIV. Talk with your health care provider about whether you are at high risk of being infected with HIV. If you choose to begin PrEP, you should first be tested for HIV. You should then be tested every 3 months for as long as you are taking PrEP.    PREGNANCY   If you are premenopausal and you may become pregnant, ask your health care provider about preconception counseling.  If you may become pregnant, take 400 to 800 micrograms (mcg) of folic acid every day.  If you want to prevent pregnancy, talk to your health care provider about birth control (contraception). OSTEOPOROSIS AND MENOPAUSE   Osteoporosis is a disease in which the bones lose minerals and strength with aging. This can result in serious bone fractures. Your risk for osteoporosis can be identified using a bone density scan.  If you are 65 years of age or older, or if you are at risk for osteoporosis and fractures, ask your health care provider if you should be screened.  Ask your health care provider whether you should take a calcium or vitamin D supplement to lower your risk for osteoporosis.  Menopause may have certain physical symptoms and risks.  Hormone replacement therapy may reduce some of these symptoms and risks. Talk to your health care provider about whether hormone replacement therapy is right for you.  HOME CARE INSTRUCTIONS   Schedule regular health, dental, and eye exams.  Stay current with your immunizations.   Do not use any tobacco products including cigarettes, chewing tobacco, or electronic cigarettes.  If you are pregnant, do not drink alcohol.  If you are breastfeeding, limit how much and how often you drink alcohol.  Limit alcohol intake to no more than 1 drink per day for nonpregnant women. One drink equals 12 ounces of beer, 5 ounces of wine, or 1 ounces of hard liquor.  Do not use street drugs.  Do not share needles.  Ask your health care provider for help if you need  support or information about quitting drugs.  Tell your health care provider if you often feel depressed.  Tell your health care provider if you have ever been abused or do not feel safe at home. Document Released: 04/14/2011 Document Revised: 02/13/2014 Document Reviewed: 08/31/2013 ExitCare Patient Information 2015 [employer]. This information is not intended to replace advice given to you by your health care provider. Make sure you discuss any questions you have with your health care provider.  

## 2015-04-25 ENCOUNTER — Other Ambulatory Visit: Payer: Self-pay | Admitting: Internal Medicine

## 2015-05-02 ENCOUNTER — Other Ambulatory Visit: Payer: Self-pay | Admitting: Hematology and Oncology

## 2015-05-02 DIAGNOSIS — Z853 Personal history of malignant neoplasm of breast: Secondary | ICD-10-CM

## 2015-06-19 ENCOUNTER — Ambulatory Visit
Admission: RE | Admit: 2015-06-19 | Discharge: 2015-06-19 | Disposition: A | Discharge status: Home / Self Care | Payer: Commercial Managed Care - HMO | Source: Ambulatory Visit | Attending: Hematology and Oncology | Admitting: Hematology and Oncology

## 2015-06-19 DIAGNOSIS — Z853 Personal history of malignant neoplasm of breast: Secondary | ICD-10-CM

## 2015-06-30 ENCOUNTER — Other Ambulatory Visit: Payer: Self-pay | Admitting: Internal Medicine

## 2015-07-16 ENCOUNTER — Telehealth: Payer: Self-pay | Admitting: *Deleted

## 2015-07-16 ENCOUNTER — Other Ambulatory Visit: Payer: Self-pay | Admitting: *Deleted

## 2015-07-16 DIAGNOSIS — C50211 Malignant neoplasm of upper-inner quadrant of right female breast: Secondary | ICD-10-CM

## 2015-07-16 MED ORDER — ANASTROZOLE 1 MG PO TABS: 1.0000 mg | ORAL_TABLET | Freq: Every day | ORAL | Status: DC

## 2015-07-16 NOTE — Telephone Encounter (Signed)
Voicemail: "I called my pharmacy for refill.  They can't reach anyone stating my doctor has moved somewhere.  I am a patient of Dr. Geralyn Flash."  Called patient "I need Anastrozole sent to my [place].  I'm not out of it yet."

## 2015-08-02 ENCOUNTER — Ambulatory Visit (INDEPENDENT_AMBULATORY_CARE_PROVIDER_SITE_OTHER): Payer: Commercial Managed Care - HMO

## 2015-08-02 DIAGNOSIS — Z23 Encounter for immunization: Secondary | ICD-10-CM | POA: Diagnosis not present

## 2015-08-10 ENCOUNTER — Other Ambulatory Visit: Payer: Self-pay | Admitting: Internal Medicine

## 2015-10-22 ENCOUNTER — Ambulatory Visit: Payer: Commercial Managed Care - HMO | Admitting: Internal Medicine

## 2015-10-25 ENCOUNTER — Encounter: Payer: Self-pay | Admitting: Internal Medicine

## 2015-10-25 ENCOUNTER — Ambulatory Visit (INDEPENDENT_AMBULATORY_CARE_PROVIDER_SITE_OTHER): Payer: PPO | Admitting: Internal Medicine

## 2015-10-25 VITALS — BP 136/70 | HR 61 | Temp 98.5°F | Resp 20 | Ht 66.0 in | Wt 192.0 lb

## 2015-10-25 DIAGNOSIS — E785 Hyperlipidemia, unspecified: Secondary | ICD-10-CM

## 2015-10-25 DIAGNOSIS — I10 Essential (primary) hypertension: Secondary | ICD-10-CM

## 2015-10-25 DIAGNOSIS — C50211 Malignant neoplasm of upper-inner quadrant of right female breast: Secondary | ICD-10-CM | POA: Diagnosis not present

## 2015-10-25 DIAGNOSIS — Z860100 Personal history of colon polyps, unspecified: Secondary | ICD-10-CM

## 2015-10-25 NOTE — Progress Notes (Signed)
Subjective:    Patient ID: Alexandra Calderon, female    DOB: 11-05-1940, 75 y.o.   MRN: [id number]  HPI  75 year old patient who is seen today for her biannual follow-up.  She has a history of essential hypertension on triple therapy.  She joined does quite well.  She states his systolic blood pressure readings are occasionally as high as 150.  Generally doing quite well.  She has dyslipidemia, osteoarthritis.  She is followed by oncology for right breast cancer.  She has had a recent colonoscopy and a history of colonic polyps and IBS.  Past Medical History  Diagnosis Date  . Hypertension   . Osteopenia   . PVC (premature ventricular contraction)   . Hyperlipidemia   . Arthritis   . GERD (gastroesophageal reflux disease)   . Diverticulosis   . Kidney stones   . Pneumonia   . UTI (lower urinary tract infection)   . Allergy   . Hearing loss   . Palpitations   . Colon adenomas [place]:[id number]  . Esophagitis   . Polyp, stomach   . Irritable bowel syndrome 08/27/2012  . Breast cancer ([place])     right  . Constipation     Social History   Social History  . Marital Status: Married    Spouse Name: N/A  . Number of Children: 2  . Years of Education: N/A   Occupational History  . retired    Social History Main Topics  . Smoking status: Never Smoker   . Smokeless tobacco: Never Used  . Alcohol Use: No  . Drug Use: No  . Sexual Activity: Yes   Other Topics Concern  . Not on file   Social History Narrative   Spouse:  [name]   Children:   [name]- age 50- Rexford- age 50- [name]    Past Surgical History  Procedure Laterality Date  . Kidney stones    . Dilation and curettage of uterus    . Shoulder arthroscopy rt 2009    . Shoulder surgery    . Breast lumpectomy  07/01/11    w/sentinal node bx - right side  . Colonoscopy w/ biopsies    . Esophagogastroduodenoscopy      Family History  Problem Relation Age of Onset  . Lymphoma Father   .  Cancer Father   . Hodgkin's lymphoma Father   . Diabetes Maternal Aunt   . Diabetes Paternal Grandmother   . Colon cancer Mother   . Colon polyps Neg Hx   . Rectal cancer Neg Hx   . Stomach cancer Neg Hx     Allergies  Allergen Reactions  . Levofloxacin     REACTION: rash  . Sulfamethoxazole-Trimethoprim Rash and Other (See Comments)    Chills, fever  . Sulfonamide Derivatives Rash and Other (See Comments)    Chills, fever    Current Outpatient Prescriptions on File Prior to Visit  Medication Sig Dispense Refill  . ALPRAZolam (XANAX) 0.25 MG tablet Take 1 tablet (0.25 mg total) by mouth at bedtime as needed for anxiety or sleep. 90 tablet 1  . anastrozole (ARIMIDEX) 1 MG tablet Take 1 tablet (1 mg total) by mouth daily. 90 tablet 2  . aspirin 81 MG tablet Take 81 mg by mouth at bedtime.    Marland Kitchen atenolol (TENORMIN) 100 MG tablet TAKE 1 TABLET EVERY DAY 90 tablet 1  . calcium carbonate (TITRALAC) 420 MG CHEW chewable tablet Chew 420 mg by  mouth daily.    . cephALEXin (KEFLEX) 500 MG capsule Take 1 capsule (500 mg total) by mouth as needed. 1 after sex 90 capsule 1  . cholecalciferol (VITAMIN D) 1000 UNITS tablet Take 2,000 Units by mouth daily.     . Fish Oil-Cholecalciferol (FISH OIL + D3 PO) Take 1 capsule by mouth daily.    . irbesartan-hydrochlorothiazide (AVALIDE) 150-12.5 MG tablet TAKE 1 TABLET EVERY DAY 90 tablet 1  . omega-3 acid ethyl esters (LOVAZA) 1 G capsule Take 1 g by mouth daily.    Marland Kitchen omeprazole (PRILOSEC) 40 MG capsule TAKE 1 CAPSULE DAILY 90 capsule 3  . OVER THE COUNTER MEDICATION Take 1 tablet by mouth daily. [facility] stool softener daily    . simvastatin (ZOCOR) 20 MG tablet TAKE 1 TABLET (20 MG TOTAL) DAILY. 90 tablet 1   No current facility-administered medications on file prior to visit.    BP 136/70 mmHg  Pulse 61  Temp(Src) 98.5 F (36.9 C) (Oral)  Resp 20  Ht 5\' 6"  (1.676 m)  Wt 192 lb (87.091 kg)  BMI 31.00 kg/m2  SpO2 98%     Review of  Systems  Constitutional: Negative for fever, appetite change, fatigue and unexpected weight change.  HENT: Negative for congestion, dental problem, ear pain, hearing loss, mouth sores, nosebleeds, sinus pressure, sore throat, tinnitus, trouble swallowing and voice change.   Eyes: Negative for photophobia, pain, redness and visual disturbance.  Respiratory: Negative for cough, chest tightness and shortness of breath.   Cardiovascular: Negative for chest pain, palpitations and leg swelling.  Gastrointestinal: Negative for nausea, vomiting, abdominal pain, diarrhea, constipation, blood in stool, abdominal distention and rectal pain.  Genitourinary: Negative for dysuria, urgency, frequency, hematuria, flank pain, vaginal bleeding, vaginal discharge, difficulty urinating, genital sores, vaginal pain, menstrual problem and pelvic pain.  Musculoskeletal: Negative for back pain, arthralgias and neck stiffness.  Skin: Negative for rash.  Neurological: Negative for dizziness, syncope, speech difficulty, weakness, light-headedness, numbness and headaches.  Hematological: Negative for adenopathy. Does not bruise/bleed easily.  Psychiatric/Behavioral: Negative for suicidal ideas, behavioral problems, self-injury, dysphoric mood and agitation. The patient is not nervous/anxious.        Objective:   Physical Exam  Constitutional: She is oriented to person, place, and time. She appears well-developed and well-nourished.  Blood pressure 124/80  Blood pressure on arrival 136 over 70  HENT:  Head: Normocephalic.  Right Ear: External ear normal.  Left Ear: External ear normal.  Mouth/Throat: Oropharynx is clear and moist.  Eyes: Conjunctivae and EOM are normal. Pupils are equal, round, and reactive to light.  Neck: Normal range of motion. Neck supple. No thyromegaly present.  Cardiovascular: Normal rate, regular rhythm, normal heart sounds and intact distal pulses.   Pulmonary/Chest: Effort normal and  breath sounds normal.  Abdominal: Soft. Bowel sounds are normal. She exhibits no mass. There is no tenderness.  Musculoskeletal: Normal range of motion.  Lymphadenopathy:    She has no cervical adenopathy.  Neurological: She is alert and oriented to person, place, and time.  Skin: Skin is warm and dry. No rash noted.  Psychiatric: She has a normal mood and affect. Her behavior is normal.          Assessment & Plan:   Hypertension, stable.  No change in therapy Dyslipidemia History right breast cancer.  Follow-up oncology IBS, stable  CPX 6 months

## 2015-10-25 NOTE — Progress Notes (Signed)
Pre visit review using our clinic review tool, if applicable. No additional management support is needed unless otherwise documented below in the visit note. 

## 2015-10-25 NOTE — Patient Instructions (Signed)
Limit your sodium (Salt) intake    It is important that you exercise regularly, at least 20 minutes 3 to 4 times per week.  If you develop chest pain or shortness of breath seek  medical attention.  Please check your blood pressure on a regular basis.  If it is consistently greater than 150/90, please make an office appointment.  Return in 6 months for follow-up   

## 2015-10-26 DIAGNOSIS — C50911 Malignant neoplasm of unspecified site of right female breast: Secondary | ICD-10-CM | POA: Diagnosis not present

## 2015-10-29 DIAGNOSIS — H2513 Age-related nuclear cataract, bilateral: Secondary | ICD-10-CM | POA: Diagnosis not present

## 2016-01-15 DIAGNOSIS — L570 Actinic keratosis: Secondary | ICD-10-CM | POA: Diagnosis not present

## 2016-01-15 DIAGNOSIS — L821 Other seborrheic keratosis: Secondary | ICD-10-CM | POA: Diagnosis not present

## 2016-01-15 DIAGNOSIS — B079 Viral wart, unspecified: Secondary | ICD-10-CM | POA: Diagnosis not present

## 2016-01-15 DIAGNOSIS — D225 Melanocytic nevi of trunk: Secondary | ICD-10-CM | POA: Diagnosis not present

## 2016-01-15 DIAGNOSIS — X32XXXA Exposure to sunlight, initial encounter: Secondary | ICD-10-CM | POA: Diagnosis not present

## 2016-01-28 ENCOUNTER — Telehealth: Payer: Self-pay | Admitting: Internal Medicine

## 2016-01-28 ENCOUNTER — Telehealth: Payer: Self-pay | Admitting: *Deleted

## 2016-01-28 DIAGNOSIS — C50211 Malignant neoplasm of upper-inner quadrant of right female breast: Secondary | ICD-10-CM

## 2016-01-28 MED ORDER — IRBESARTAN-HYDROCHLOROTHIAZIDE 150-12.5 MG PO TABS: 1.0000 | ORAL_TABLET | Freq: Every day | ORAL | Status: DC

## 2016-01-28 MED ORDER — SIMVASTATIN 20 MG PO TABS: ORAL_TABLET | ORAL | Status: DC

## 2016-01-28 MED ORDER — ATENOLOL 100 MG PO TABS: 100.0000 mg | ORAL_TABLET | Freq: Every day | ORAL | Status: DC

## 2016-01-28 MED ORDER — ANASTROZOLE 1 MG PO TABS: 1.0000 mg | ORAL_TABLET | Freq: Every day | ORAL | Status: DC

## 2016-01-28 NOTE — Telephone Encounter (Signed)
Pt request refill of the following:                   Demographics Alexandra Calderon will be 75 on the 18th  Alexandra Calderon 75 year old female 21-Oct-1940  [place] [id number] [phone] (M)  Comm Pref:      Advanced Directives  02/28/15 02/13/15 08/14/14  Does patient have an advance directive? No No No  Chief Complaint    Medication Refill   Problem List Dyslipidemia  Essential hypertension  GERD  CYSTITIS, CHRONIC  Osteoarthritis  ROTATOR CUFF TEAR  OSTEOPENIA  PALPITATIONS  History of colonic polyps  Breast cancer of upper-inner quadrant of right female breast ([place])  Irritable bowel syndrome  Health MaintenancePostponedSoonDueLate        Topic Due Last Communication    ZOSTAVAX 01/29/2003     INFLUENZA VACCINE 05/13/2016     TETANUS/TDAP 05/23/2017     MAMMOGRAM 06/18/2017     COLONOSCOPY 03/21/2020     DEXA SCAN Completed     PNA vac Low Risk Adult Completed 04/09/2015   Reminders and Results None     Care Team and Communications Referring Provider   No referring provider set   PCPs Type  Marletta Lor, MD General  Other Patient Care Team Members Relationship  None   Recipients of Past Communications   Letter (Out) - 03/20/2015    ETHELDA DULWORTH 03/22/2015 Mail  Clinical Support - 02/28/2015    Jolayne Haines J Dejager 02/28/2015 [facility] Encounter - 06/20/2014    Daryll Brod, MD 06/20/2014 Fax  Marletta Lor, MD 06/20/2014 In [facility] Encounter - 03/06/2014    Ricard Dillon, MD 03/06/2014 In [place], MD 03/06/2014 In Basket  Appointment - 08/06/2011    Kendell Bane, MD 09/09/2011 Fax  Ricard Dillon, MD 09/09/2011 In Basket  Arloa Koh, MD 09/09/2011 In Basket  Selinda Orion, MD (Inactive) 09/09/2011 Fax  Madilyn Hook, DO 09/09/2011 In Basket   Allergies   ^ [phone] RVAMP LAUNCH_OPTIONS;1 RVAMP " data-rvlinknum="2">Levofloxacin    Rash!Other!(See!Comments)^ [phone] RVAMP LAUNCH_OPTIONS;1 RVAMP " data-rvlinknum="3">Sulfamethoxazole-trimethoprimRash, Other (See Comments)  Rash!Other!(See!Comments)^ [phone] RVAMP LAUNCH_OPTIONS;1 RVAMP " data-rvlinknum="4">Sulfonamide DerivativesRash, Other (See Comments)  Medications Outpatient Medications (13) Hospital Medications (0) Clinic-Administered Medications (0)   ALPRAZolam (XANAX) 0.25 MG tablet   anastrozole (ARIMIDEX) 1 MG tablet   aspirin 81 MG tablet   atenolol (TENORMIN) 100 MG tablet   calcium carbonate (TITRALAC) 420 MG CHEW chewable tablet   cephALEXin (KEFLEX) 500 MG capsule   cholecalciferol (VITAMIN D) 1000 UNITS tablet   Fish Oil-Cholecalciferol (FISH OIL + D3 PO)   irbesartan-hydrochlorothiazide (AVALIDE) 150-12.5 MG tablet   omega-3 acid ethyl esters (LOVAZA) 1 G capsule   omeprazole (PRILOSEC) 40 MG capsule   OVER THE COUNTER MEDICATION   simvastatin (ZOCOR) 20 MG tablet      Mark as Reviewed Reviewed by MD on 10/25/2015.  Preferred Pharmacies    [id number] [name], [place] - [address] [place] [phone] (Phone) [phone] (Fax)  Immunizations/Injections    DTP 05/24/2007  Influenza Split 06/29/2012  Influenza Whole 07/26/2010, 07/05/2009, 07/28/2008, . . .  Influenza, High Dose Seasonal PF 08/02/2015, 07/25/2014  Influenza,inj,Quad PF,36+ Mos 07/05/2013  Pneumococcal Conjugate-13 08/15/2013  Pneumococcal Polysaccharide-23 04/19/2015, 10/13/2002  Td 05/24/2007, 10/13/2006  Significant History/Details   Smoking: Never Smoker   Smokeless Tobacco: Never Used  Alcohol: No  3 open  orders  Preferred Language: English  Specialty CommentsShow AllReport No comments regarding your specialty  Family Comments None                          Oxygen Therapy No data recorded                                                                                                                                                                                                                                                                                                             My Last Outpatient Progress Note There are no Outpatient notes of this type for this patient          Phamacy:  [facility]

## 2016-01-28 NOTE — Telephone Encounter (Signed)
Pt request refill of the following: atenolol (TENORMIN) 100 MG tablet , irbesartan-hydrochlorothiazide (AVALIDE) 150-12.5 MG tablet ,  simvastatin (ZOCOR) 20 MG tablet  Pt req 90 day supply and she is now using the below pharmacy   Phamacy:  [facility]

## 2016-01-28 NOTE — Telephone Encounter (Signed)
error 

## 2016-01-28 NOTE — Telephone Encounter (Signed)
TC from patient requesting refill on her Anastrozole. She has changed insurance providers and refill [facility] [address]. Pt has a follow up appt with Dr. Lindi Adie on Feb 12, 2016  [facility]

## 2016-01-28 NOTE — Telephone Encounter (Signed)
Pt notified Rx's sent to new pharmacy. Pt verbalized understanding.

## 2016-02-05 ENCOUNTER — Ambulatory Visit (INDEPENDENT_AMBULATORY_CARE_PROVIDER_SITE_OTHER): Payer: PPO | Admitting: Internal Medicine

## 2016-02-05 ENCOUNTER — Encounter: Payer: Self-pay | Admitting: Internal Medicine

## 2016-02-05 VITALS — BP 120/80 | HR 65 | Temp 98.5°F | Resp 20 | Ht 66.0 in | Wt 195.0 lb

## 2016-02-05 DIAGNOSIS — I10 Essential (primary) hypertension: Secondary | ICD-10-CM

## 2016-02-05 MED ORDER — FAMOTIDINE 20 MG PO TABS: 20.0000 mg | ORAL_TABLET | Freq: Two times a day (BID) | ORAL | Status: DC

## 2016-02-05 MED ORDER — FAMOTIDINE IN NACL 20-0.9 MG/50ML-% IV SOLN: 20.0000 mg | Freq: Two times a day (BID) | INTRAVENOUS | Status: DC

## 2016-02-05 NOTE — Progress Notes (Signed)
Subjective:    Patient ID: Alexandra Calderon, female    DOB: 1940-11-11, 75 y.o.   MRN: [id number]  HPI  75 year old patient who has a history of essential hypertension.  She generally enjoys good health.  Her husband has been evaluated and treated for acute bronchitis.  For the past 3 days she has had some mild cough, chest congestion, nasal congestion and occasional mild nocturnal wheezing.  Today she feels quite well.  No fever, no shortness of breath  Past Medical History  Diagnosis Date  . Hypertension   . Osteopenia   . PVC (premature ventricular contraction)   . Hyperlipidemia   . Arthritis   . GERD (gastroesophageal reflux disease)   . Diverticulosis   . Kidney stones   . Pneumonia   . UTI (lower urinary tract infection)   . Allergy   . Hearing loss   . Palpitations   . Colon adenomas Bryant:[id number]  . Esophagitis   . Polyp, stomach   . Irritable bowel syndrome 08/27/2012  . Breast cancer ([place])     right  . Constipation      Social History   Social History  . Marital Status: Married    Spouse Name: N/A  . Number of Children: 2  . Years of Education: N/A   Occupational History  . retired    Social History Main Topics  . Smoking status: Never Smoker   . Smokeless tobacco: Never Used  . Alcohol Use: No  . Drug Use: No  . Sexual Activity: Yes   Other Topics Concern  . Not on file   Social History Narrative   Spouse:  [name]   Children:   [name]- age 8- Tollette- age 73- [name]    Past Surgical History  Procedure Laterality Date  . Kidney stones    . Dilation and curettage of uterus    . Shoulder arthroscopy rt 2009    . Shoulder surgery    . Breast lumpectomy  07/01/11    w/sentinal node bx - right side  . Colonoscopy w/ biopsies    . Esophagogastroduodenoscopy      Family History  Problem Relation Age of Onset  . Lymphoma Father   . Cancer Father   . Hodgkin's lymphoma Father   . Diabetes Maternal Aunt   .  Diabetes Paternal Grandmother   . Colon cancer Mother   . Colon polyps Neg Hx   . Rectal cancer Neg Hx   . Stomach cancer Neg Hx     Allergies  Allergen Reactions  . Levofloxacin     REACTION: rash  . Sulfamethoxazole-Trimethoprim Rash and Other (See Comments)    Chills, fever  . Sulfonamide Derivatives Rash and Other (See Comments)    Chills, fever    Current Outpatient Prescriptions on File Prior to Visit  Medication Sig Dispense Refill  . ALPRAZolam (XANAX) 0.25 MG tablet Take 1 tablet (0.25 mg total) by mouth at bedtime as needed for anxiety or sleep. 90 tablet 1  . anastrozole (ARIMIDEX) 1 MG tablet Take 1 tablet (1 mg total) by mouth daily. 90 tablet 2  . aspirin 81 MG tablet Take 81 mg by mouth at bedtime.    Marland Kitchen atenolol (TENORMIN) 100 MG tablet Take 1 tablet (100 mg total) by mouth daily. 90 tablet 1  . cephALEXin (KEFLEX) 500 MG capsule Take 1 capsule (500 mg total) by mouth as needed. 1 after sex 90 capsule 1  .  cholecalciferol (VITAMIN D) 1000 UNITS tablet Take 2,000 Units by mouth daily.     . Fish Oil-Cholecalciferol (FISH OIL + D3 PO) Take 1 capsule by mouth daily.    . irbesartan-hydrochlorothiazide (AVALIDE) 150-12.5 MG tablet Take 1 tablet by mouth daily. 90 tablet 1  . omega-3 acid ethyl esters (LOVAZA) 1 G capsule Take 1 g by mouth daily.    Marland Kitchen omeprazole (PRILOSEC) 40 MG capsule TAKE 1 CAPSULE DAILY 90 capsule 3  . OVER THE COUNTER MEDICATION Take 1 tablet by mouth daily. [facility] stool softener daily    . simvastatin (ZOCOR) 20 MG tablet TAKE 1 TABLET (20 MG TOTAL) DAILY. 90 tablet 1   No current facility-administered medications on file prior to visit.    BP 120/80 mmHg  Pulse 65  Temp(Src) 98.5 F (36.9 C) (Oral)  Resp 20  Ht 5\' 6"  (1.676 m)  Wt 195 lb (88.451 kg)  BMI 31.49 kg/m2  SpO2 98%     Review of Systems  Constitutional: Positive for activity change and fatigue. Negative for fever.  HENT: Positive for congestion and postnasal drip.  Negative for dental problem, hearing loss, rhinorrhea, sinus pressure, sore throat and tinnitus.   Eyes: Negative for pain, discharge and visual disturbance.  Respiratory: Positive for cough and wheezing. Negative for shortness of breath.   Cardiovascular: Negative for chest pain, palpitations and leg swelling.  Gastrointestinal: Negative for nausea, vomiting, abdominal pain, diarrhea, constipation, blood in stool and abdominal distention.  Genitourinary: Negative for dysuria, urgency, frequency, hematuria, flank pain, vaginal bleeding, vaginal discharge, difficulty urinating, vaginal pain and pelvic pain.  Musculoskeletal: Negative for joint swelling, arthralgias and gait problem.  Skin: Negative for rash.  Neurological: Negative for dizziness, syncope, speech difficulty, weakness, numbness and headaches.  Hematological: Negative for adenopathy.  Psychiatric/Behavioral: Negative for behavioral problems, dysphoric mood and agitation. The patient is not nervous/anxious.        Objective:   Physical Exam  Constitutional: She is oriented to person, place, and time. She appears well-developed and well-nourished.  HENT:  Head: Normocephalic.  Right Ear: External ear normal.  Left Ear: External ear normal.  Mouth/Throat: Oropharynx is clear and moist.  Eyes: Conjunctivae and EOM are normal. Pupils are equal, round, and reactive to light.  Neck: Normal range of motion. Neck supple. No thyromegaly present.  Cardiovascular: Normal rate, regular rhythm, normal heart sounds and intact distal pulses.   Pulmonary/Chest: Effort normal and breath sounds normal. No respiratory distress. She has no wheezes. She has no rales.  Abdominal: Soft. Bowel sounds are normal. She exhibits no mass. There is no tenderness.  Musculoskeletal: Normal range of motion.  Lymphadenopathy:    She has no cervical adenopathy.  Neurological: She is alert and oriented to person, place, and time.  Skin: Skin is warm and dry.  No rash noted.  Psychiatric: She has a normal mood and affect. Her behavior is normal.          Assessment & Plan:   Mild acute viral bronchitis.  Will treat symptomatically Essential hypertension, stable

## 2016-02-05 NOTE — Patient Instructions (Addendum)
Acute bronchitis symptoms for less than 10 days are generally not helped by antibiotics.  Take over-the-counter expectorants and cough medications such as  Mucinex DM.  Call if there is no improvement in 5 to 7 days or if  you develop worsening cough, fever, or new symptoms, such as shortness of breath or chest pain.  Discontinue omeprazole after present supply and give a trial of Pepcid 20 mg twice daily

## 2016-02-05 NOTE — Progress Notes (Signed)
Pre visit review using our clinic review tool, if applicable. No additional management support is needed unless otherwise documented below in the visit note. 

## 2016-02-06 ENCOUNTER — Telehealth: Payer: Self-pay | Admitting: Internal Medicine

## 2016-02-06 NOTE — Telephone Encounter (Signed)
Please see message and advise 

## 2016-02-06 NOTE — Telephone Encounter (Signed)
Pt call to ask if Dr Raliegh Ip would call her in something for a cough   Pharmacy [facility]

## 2016-02-06 NOTE — Telephone Encounter (Signed)
Hydromet 6 ounces 1/2-1 teaspoon  every 6 hours as needed for cough and congestion

## 2016-02-07 NOTE — Telephone Encounter (Signed)
Spoke to pt, told her Dr.K said he would give her a Rx for Hycodan cough syrup but you have to pickup the Rx here and take to the pharmacy. Pt verbalized understanding and said she did much better last night and is going to pass on the Rx for now. Told pt okay but if cough gets worse please call the office. Pt verbalized understanding.

## 2016-02-12 ENCOUNTER — Ambulatory Visit (HOSPITAL_BASED_OUTPATIENT_CLINIC_OR_DEPARTMENT_OTHER): Payer: PPO | Admitting: Hematology and Oncology

## 2016-02-12 ENCOUNTER — Other Ambulatory Visit (HOSPITAL_BASED_OUTPATIENT_CLINIC_OR_DEPARTMENT_OTHER): Payer: PPO

## 2016-02-12 ENCOUNTER — Encounter: Payer: Self-pay | Admitting: Hematology and Oncology

## 2016-02-12 ENCOUNTER — Telehealth: Payer: Self-pay | Admitting: Hematology and Oncology

## 2016-02-12 VITALS — BP 127/58 | HR 98 | Temp 98.1°F | Resp 18 | Ht 66.0 in | Wt 191.1 lb

## 2016-02-12 DIAGNOSIS — C50211 Malignant neoplasm of upper-inner quadrant of right female breast: Secondary | ICD-10-CM

## 2016-02-12 DIAGNOSIS — M8580 Other specified disorders of bone density and structure, unspecified site: Secondary | ICD-10-CM | POA: Insufficient documentation

## 2016-02-12 LAB — CBC WITH DIFFERENTIAL/PLATELET
BASO%: 0.2 % (ref 0.0–2.0)
Basophils Absolute: 0 10*3/uL (ref 0.0–0.1)
EOS%: 2.4 % (ref 0.0–7.0)
Eosinophils Absolute: 0.1 10*3/uL (ref 0.0–0.5)
HCT: 39 % (ref 34.8–46.6)
HGB: 13.1 g/dL (ref 11.6–15.9)
LYMPH%: 33.4 % (ref 14.0–49.7)
MCH: 29.1 pg (ref 25.1–34.0)
MCHC: 33.6 g/dL (ref 31.5–36.0)
MCV: 86.7 fL (ref 79.5–101.0)
MONO#: 0.5 10*3/uL (ref 0.1–0.9)
MONO%: 8.8 % (ref 0.0–14.0)
NEUT#: 3.2 10*3/uL (ref 1.5–6.5)
NEUT%: 55.2 % (ref 38.4–76.8)
Platelets: 213 10*3/uL (ref 145–400)
RBC: 4.5 10*6/uL (ref 3.70–5.45)
RDW: 13.9 % (ref 11.2–14.5)
WBC: 5.8 10*3/uL (ref 3.9–10.3)
lymph#: 1.9 10*3/uL (ref 0.9–3.3)

## 2016-02-12 LAB — COMPREHENSIVE METABOLIC PANEL WITH GFR
ALT: 19 U/L (ref 0–55)
AST: 18 U/L (ref 5–34)
Albumin: 3.7 g/dL (ref 3.5–5.0)
Alkaline Phosphatase: 108 U/L (ref 40–150)
Anion Gap: 9 meq/L (ref 3–11)
BUN: 14.8 mg/dL (ref 7.0–26.0)
CO2: 30 meq/L — ABNORMAL HIGH (ref 22–29)
Calcium: 9.3 mg/dL (ref 8.4–10.4)
Chloride: 104 meq/L (ref 98–109)
Creatinine: 1 mg/dL (ref 0.6–1.1)
EGFR: 55 ml/min/1.73 m2 — ABNORMAL LOW
Glucose: 95 mg/dL (ref 70–140)
Potassium: 3.5 meq/L (ref 3.5–5.1)
Sodium: 143 meq/L (ref 136–145)
Total Bilirubin: 0.61 mg/dL (ref 0.20–1.20)
Total Protein: 7.2 g/dL (ref 6.4–8.3)
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
## 2016-02-12 NOTE — Progress Notes (Signed)
Patient Care Team: Marletta Lor, MD as PCP - General (Internal Medicine)  DIAGNOSIS: Breast cancer of upper-inner quadrant of right female breast [facility])   Staging form: Breast, AJCC 7th Edition     Clinical: No stage assigned - Unsigned     Pathologic: Stage IIB (T2, N74m, cM0) - Signed by VRulon Eisenmenger MD on 08/14/2014   SUMMARY OF ONCOLOGIC HISTORY:   Breast cancer of upper-inner quadrant of right female breast ([facility]   06/11/2011 Initial Diagnosis Right breast mass biopsy: Invasive mammary cancer   07/01/2011 Surgery Right breast lumpectomy: 2.2 cm IDC grade 1, 0/1 lymph node, ER/PR positive HER-2 negative Ki-67 16% T2 N0 M0 stage II a   08/15/2011 - 09/25/2011 Radiation Therapy Adjuvant radiation therapy by Dr. MValere Dross  09/15/2011 -  Anti-estrogen oral therapy Arimidex 1 mg daily 5 years    CHIEF COMPLIANT: Follow-up on anastrozole  INTERVAL HISTORY: Alexandra UCCIis a 75year old with above-mentioned history right breast cancer currently on anastrozole therapy and appears with tolerating extremely well. She has occasional hot flashes. She denies any myalgias. Denies a lumps or nodules in the breasts.  REVIEW OF SYSTEMS:   Constitutional: Denies fevers, chills or abnormal weight loss Eyes: Denies blurriness of vision Ears, nose, mouth, throat, and face: Denies mucositis or sore throat Respiratory: Denies cough, dyspnea or wheezes Cardiovascular: Denies palpitation, chest discomfort Gastrointestinal:  Denies nausea, heartburn or change in bowel habits Skin: Denies abnormal skin rashes Lymphatics: Denies new lymphadenopathy or easy bruising Neurological:Denies numbness, tingling or new weaknesses Behavioral/Psych: Mood is stable, no new changes  Extremities: No lower extremity edema Breast:  denies any pain or lumps or nodules in either breasts All other systems were reviewed with the patient and are negative.  I have reviewed the past medical history, past surgical  history, social history and family history with the patient and they are unchanged from previous note.  ALLERGIES:  is allergic to levofloxacin; sulfamethoxazole-trimethoprim; and sulfonamide derivatives.  MEDICATIONS:  Current Outpatient Prescriptions  Medication Sig Dispense Refill  . ALPRAZolam (XANAX) 0.25 MG tablet Take 1 tablet (0.25 mg total) by mouth at bedtime as needed for anxiety or sleep. 90 tablet 1  . anastrozole (ARIMIDEX) 1 MG tablet Take 1 tablet (1 mg total) by mouth daily. 90 tablet 2  . aspirin 81 MG tablet Take 81 mg by mouth at bedtime.    .Marland Kitchenatenolol (TENORMIN) 100 MG tablet Take 1 tablet (100 mg total) by mouth daily. 90 tablet 1  . cephALEXin (KEFLEX) 500 MG capsule Take 1 capsule (500 mg total) by mouth as needed. 1 after sex 90 capsule 1  . cholecalciferol (VITAMIN D) 1000 UNITS tablet Take 2,000 Units by mouth daily.     . Coenzyme Q10 (CO Q 10 PO) Take 100 mg by mouth daily.    . famotidine (PEPCID) 20 MG tablet Take 1 tablet (20 mg total) by mouth 2 (two) times daily. 60 tablet 4  . Fish Oil-Cholecalciferol (FISH OIL + D3 PO) Take 1 capsule by mouth daily.    . irbesartan-hydrochlorothiazide (AVALIDE) 150-12.5 MG tablet Take 1 tablet by mouth daily. 90 tablet 1  . omega-3 acid ethyl esters (LOVAZA) 1 G capsule Take 1 g by mouth daily.    .Marland KitchenOVER THE COUNTER MEDICATION Take 1 tablet by mouth daily. [facility] stool softener daily    . simvastatin (ZOCOR) 20 MG tablet TAKE 1 TABLET (20 MG TOTAL) DAILY. 90 tablet 1   No current facility-administered  medications for this visit.    PHYSICAL EXAMINATION: ECOG PERFORMANCE STATUS: 0 - Asymptomatic  Filed Vitals:   02/12/16 1332  BP: 127/58  Pulse: 98  Temp: 98.1 F (36.7 C)  Resp: 18   Filed Weights   02/12/16 1332  Weight: 191 lb 1.6 oz (86.682 kg)    GENERAL:alert, no distress and comfortable SKIN: skin color, texture, turgor are normal, no rashes or significant lesions EYES: normal, Conjunctiva are pink  and non-injected, sclera clear OROPHARYNX:no exudate, no erythema and lips, buccal mucosa, and tongue normal  NECK: supple, thyroid normal size, non-tender, without nodularity LYMPH:  no palpable lymphadenopathy in the cervical, axillary or inguinal LUNGS: clear to auscultation and percussion with normal breathing effort HEART: regular rate & rhythm and no murmurs and no lower extremity edema ABDOMEN:abdomen soft, non-tender and normal bowel sounds MUSCULOSKELETAL:no cyanosis of digits and no clubbing  NEURO: alert & oriented x 3 with fluent speech, no focal motor/sensory deficits EXTREMITIES: No lower extremity edema BREAST: No palpable masses or nodules in either right or left breasts. No palpable axillary supraclavicular or infraclavicular adenopathy no breast tenderness or nipple discharge. (exam performed in the presence of a chaperone)  LABORATORY DATA:  I have reviewed the data as listed   Chemistry      Component Value Date/Time   NA 145 04/12/2015 0759   NA 142 02/13/2015 1334   K 4.3 04/12/2015 0759   K 3.6 02/13/2015 1334   CL 105 04/12/2015 0759   CL 99 10/07/2012 0859   CO2 34* 04/12/2015 0759   CO2 30* 02/13/2015 1334   BUN 14 04/12/2015 0759   BUN 13.9 02/13/2015 1334   CREATININE 0.93 04/12/2015 0759   CREATININE 0.8 02/13/2015 1334      Component Value Date/Time   CALCIUM 9.4 04/12/2015 0759   CALCIUM 9.2 02/13/2015 1334   ALKPHOS 103 04/12/2015 0759   ALKPHOS 111 02/13/2015 1334   AST 17 04/12/2015 0759   AST 17 02/13/2015 1334   ALT 18 04/12/2015 0759   ALT 19 02/13/2015 1334   BILITOT 0.7 04/12/2015 0759   BILITOT 0.63 02/13/2015 1334       Lab Results  Component Value Date   WBC 5.8 02/12/2016   HGB 13.1 02/12/2016   HCT 39.0 02/12/2016   MCV 86.7 02/12/2016   PLT 213 02/12/2016   NEUTROABS 3.2 02/12/2016   ASSESSMENT & PLAN:  Breast cancer of upper-inner quadrant of right female breast Stage II (T2 N1) invasive ductal carcinoma of the  right breast originally diagnosed in August 2012. Patient is status post lumpectomy with sentinel lymph node biopsy with one of 2 lymph nodes positive for micrometastatic disease.status post radiation therapy currently on antiestrogen therapy with Arimidex started in December 2012  Anastrozole toxicities: 1. Bone density January 2015: Osteopenia T score -1.6 in the arm, hip T score -0.8: We will order bone density to be done at the breast center  We discussed the issue regarding duration of therapy. I discussed the results of in May 17 clinical trial showing 4% absolute decrease in relapse rate for continuation of aromatase inhibitor therapy to 10 years.  Breast Cancer Surveillance: 1. Breast exam 02/12/2016: Normal 2. Mammogram 06/19/2015 No abnormalities. Postsurgical changes. Breast Density Category B. I recommended that she get 3-D mammograms for surveillance. Discussed the differences between different breast density categories.  Return to clinic in 1 year for follow-up  No orders of the defined types were placed in this encounter.   The patient  has a good understanding of the overall plan. she agrees with it. she will call with any problems that may develop before the next visit here.   Rulon Eisenmenger, MD 02/12/2016

## 2016-02-12 NOTE — Telephone Encounter (Signed)
appt made and avs printed °

## 2016-02-13 ENCOUNTER — Encounter (HOSPITAL_COMMUNITY): Payer: Self-pay | Admitting: Emergency Medicine

## 2016-02-13 ENCOUNTER — Emergency Department (HOSPITAL_COMMUNITY)
Admission: EM | Admit: 2016-02-13 | Discharge: 2016-02-14 | Disposition: A | Discharge status: Home / Self Care | Payer: PPO | Attending: Emergency Medicine | Admitting: Emergency Medicine

## 2016-02-13 DIAGNOSIS — W010XXA Fall on same level from slipping, tripping and stumbling without subsequent striking against object, initial encounter: Secondary | ICD-10-CM | POA: Diagnosis not present

## 2016-02-13 DIAGNOSIS — S0181XA Laceration without foreign body of other part of head, initial encounter: Secondary | ICD-10-CM | POA: Insufficient documentation

## 2016-02-13 DIAGNOSIS — M1990 Unspecified osteoarthritis, unspecified site: Secondary | ICD-10-CM | POA: Diagnosis not present

## 2016-02-13 DIAGNOSIS — W19XXXA Unspecified fall, initial encounter: Secondary | ICD-10-CM

## 2016-02-13 DIAGNOSIS — Z87442 Personal history of urinary calculi: Secondary | ICD-10-CM | POA: Insufficient documentation

## 2016-02-13 DIAGNOSIS — Y9389 Activity, other specified: Secondary | ICD-10-CM | POA: Insufficient documentation

## 2016-02-13 DIAGNOSIS — S20212A Contusion of left front wall of thorax, initial encounter: Secondary | ICD-10-CM | POA: Insufficient documentation

## 2016-02-13 DIAGNOSIS — M8580 Other specified disorders of bone density and structure, unspecified site: Secondary | ICD-10-CM | POA: Diagnosis not present

## 2016-02-13 DIAGNOSIS — Y998 Other external cause status: Secondary | ICD-10-CM | POA: Insufficient documentation

## 2016-02-13 DIAGNOSIS — Z79899 Other long term (current) drug therapy: Secondary | ICD-10-CM | POA: Insufficient documentation

## 2016-02-13 DIAGNOSIS — S0083XA Contusion of other part of head, initial encounter: Secondary | ICD-10-CM | POA: Diagnosis not present

## 2016-02-13 DIAGNOSIS — Z86018 Personal history of other benign neoplasm: Secondary | ICD-10-CM | POA: Diagnosis not present

## 2016-02-13 DIAGNOSIS — Z87440 Personal history of urinary (tract) infections: Secondary | ICD-10-CM | POA: Insufficient documentation

## 2016-02-13 DIAGNOSIS — Z7982 Long term (current) use of aspirin: Secondary | ICD-10-CM | POA: Insufficient documentation

## 2016-02-13 DIAGNOSIS — H9190 Unspecified hearing loss, unspecified ear: Secondary | ICD-10-CM | POA: Insufficient documentation

## 2016-02-13 DIAGNOSIS — E785 Hyperlipidemia, unspecified: Secondary | ICD-10-CM | POA: Insufficient documentation

## 2016-02-13 DIAGNOSIS — K219 Gastro-esophageal reflux disease without esophagitis: Secondary | ICD-10-CM | POA: Diagnosis not present

## 2016-02-13 DIAGNOSIS — Y9222 Religious institution as the place of occurrence of the external cause: Secondary | ICD-10-CM | POA: Diagnosis not present

## 2016-02-13 DIAGNOSIS — Z853 Personal history of malignant neoplasm of breast: Secondary | ICD-10-CM | POA: Insufficient documentation

## 2016-02-13 DIAGNOSIS — I10 Essential (primary) hypertension: Secondary | ICD-10-CM | POA: Diagnosis not present

## 2016-02-13 DIAGNOSIS — S0993XA Unspecified injury of face, initial encounter: Secondary | ICD-10-CM | POA: Diagnosis present
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
## 2016-02-14 NOTE — ED Notes (Signed)
MD at bedside. 

## 2016-02-14 NOTE — Discharge Instructions (Signed)
Return to the emergency department if you develop severe headache, difficulty breathing, or other new and concerning symptoms.   Contusion A contusion is a deep bruise. Contusions are the result of a blunt injury to tissues and muscle fibers under the skin. The injury causes bleeding under the skin. The skin overlying the contusion may turn blue, purple, or yellow. Minor injuries will give you a painless contusion, but more severe contusions may stay painful and swollen for a few weeks.  CAUSES  This condition is usually caused by a blow, trauma, or direct force to an area of the body. SYMPTOMS  Symptoms of this condition include:  Swelling of the injured area.  Pain and tenderness in the injured area.  Discoloration. The area may have redness and then turn blue, purple, or yellow. DIAGNOSIS  This condition is diagnosed based on a physical exam and medical history. An X-ray, CT scan, or MRI may be needed to determine if there are any associated injuries, such as broken bones (fractures). TREATMENT  Specific treatment for this condition depends on what area of the body was injured. In general, the best treatment for a contusion is resting, icing, applying pressure to (compression), and elevating the injured area. This is often called the RICE strategy. Over-the-counter anti-inflammatory medicines may also be recommended for pain control.  HOME CARE INSTRUCTIONS   Rest the injured area.  If directed, apply ice to the injured area:  Put ice in a plastic bag.  Place a towel between your skin and the bag.  Leave the ice on for 20 minutes, 2-3 times per day.  If directed, apply light compression to the injured area using an elastic bandage. Make sure the bandage is not wrapped too tightly. Remove and reapply the bandage as directed by your health care provider.  If possible, raise (elevate) the injured area above the level of your heart while you are sitting or lying down.  Take  over-the-counter and prescription medicines only as told by your health care provider. SEEK MEDICAL CARE IF:  Your symptoms do not improve after several days of treatment.  Your symptoms get worse.  You have difficulty moving the injured area. SEEK IMMEDIATE MEDICAL CARE IF:   You have severe pain.  You have numbness in a hand or foot.  Your hand or foot turns pale or cold.   This information is not intended to replace advice given to you by your health care provider. Make sure you discuss any questions you have with your health care provider.   Document Released: 07/09/2005 Document Revised: 06/20/2015 Document Reviewed: 02/14/2015 Elsevier Interactive Patient Education 2016 [place] Injury, Adult You have a head injury. Headaches and throwing up (vomiting) are common after a head injury. It should be easy to wake up from sleeping. Sometimes you must stay in the hospital. Most problems happen within the first 24 hours. Side effects may occur up to 7-10 days after the injury.  WHAT ARE THE TYPES OF HEAD INJURIES? Head injuries can be as minor as a bump. Some head injuries can be more severe. More severe head injuries include:  A jarring injury to the brain (concussion).  A bruise of the brain (contusion). This mean there is bleeding in the brain that can cause swelling.  A cracked skull (skull fracture).  Bleeding in the brain that collects, clots, and forms a bump (hematoma). WHEN SHOULD I GET HELP RIGHT AWAY?   You are confused or sleepy.  You cannot be  woken up.  You feel sick to your stomach (nauseous) or keep throwing up (vomiting).  Your dizziness or unsteadiness is getting worse.  You have very bad, lasting headaches that are not helped by medicine. Take medicines only as told by your doctor.  You cannot use your arms or legs like normal.  You cannot walk.  You notice changes in the black spots in the center of the colored part of your eye  (pupil).  You have clear or bloody fluid coming from your nose or ears.  You have trouble seeing. During the next 24 hours after the injury, you must stay with someone who can watch you. This person should get help right away (call 911 in the [place] if you start to shake and are not able to control it (have seizures), you pass out, or you are unable to wake up. HOW CAN I PREVENT A HEAD INJURY IN THE FUTURE?  Wear seat belts.  Wear a helmet while bike riding and playing sports like football.  Stay away from dangerous activities around the house. WHEN CAN I RETURN TO NORMAL ACTIVITIES AND ATHLETICS? See your doctor before doing these activities. You should not do normal activities or play contact sports until 1 week after the following symptoms have stopped:  Headache that does not go away.  Dizziness.  Poor attention.  Confusion.  Memory problems.  Sickness to your stomach or throwing up.  Tiredness.  Fussiness.  Bothered by bright lights or loud noises.  Anxiousness or depression.  Restless sleep. MAKE SURE YOU:   Understand these instructions.  Will watch your condition.  Will get help right away if you are not doing well or get worse.   This information is not intended to replace advice given to you by your health care provider. Make sure you discuss any questions you have with your health care provider.   Document Released: 09/11/2008 Document Revised: 10/20/2014 Document Reviewed: 06/06/2013 Elsevier Interactive Patient Education [employer].

## 2016-02-14 NOTE — ED Provider Notes (Signed)
CSN: [id number]     Arrival date & time 02/13/16  2059 History  By signing my name below, I, Altamease Oiler, attest that this documentation has been prepared under the direction and in the presence of Veryl Speak, MD. Electronically Signed: Altamease Oiler, ED Scribe. 02/14/2016. 12:06 AM   Chief Complaint  Patient presents with  . Fall  . Facial Laceration    The history is provided by the patient. No language interpreter was used.   Alexandra Calderon is a 75 y.o. female who presents to the Emergency Department for evaluation after a fall tonight at church. The pt was wearing a pair of sandals and tripped before falling forward. Her glasses cut her face. Pt denies LOC.  Associated symptoms include a mildly painful laceration above the left eye and left-sided rib pain that is present when she clear her throat. Pt denies significant headache, vision change, neck pain, back pain, and abdominal pain. She is on no blood thinner apart from aspirin.   Past Medical History  Diagnosis Date  . Hypertension   . Osteopenia   . PVC (premature ventricular contraction)   . Hyperlipidemia   . Arthritis   . GERD (gastroesophageal reflux disease)   . Diverticulosis   . Kidney stones   . Pneumonia   . UTI (lower urinary tract infection)   . Allergy   . Hearing loss   . Palpitations   . Colon adenomas [id number]  . Esophagitis   . Polyp, stomach   . Irritable bowel syndrome 08/27/2012  . Breast cancer ([place])     right  . Constipation    Past Surgical History  Procedure Laterality Date  . Kidney stones    . Dilation and curettage of uterus    . Shoulder arthroscopy rt 2009    . Shoulder surgery    . Breast lumpectomy  07/01/11    w/sentinal node bx - right side  . Colonoscopy w/ biopsies    . Esophagogastroduodenoscopy     Family History  Problem Relation Age of Onset  . Lymphoma Father   . Cancer Father   . Hodgkin's lymphoma Father   . Diabetes Maternal Aunt   . Diabetes Paternal  Grandmother   . Colon cancer Mother   . Colon polyps Neg Hx   . Rectal cancer Neg Hx   . Stomach cancer Neg Hx    Social History  Substance Use Topics  . Smoking status: Never Smoker   . Smokeless tobacco: Never Used  . Alcohol Use: No   OB History    No data available     Review of Systems  10 Systems reviewed and all are negative for acute change except as noted in the HPI.  Allergies  Levofloxacin; Sulfamethoxazole-trimethoprim; and Sulfonamide derivatives  Home Medications   Prior to Admission medications   Medication Sig Start Date End Date Taking? Authorizing Provider  ALPRAZolam (XANAX) 0.25 MG tablet Take 1 tablet (0.25 mg total) by mouth at bedtime as needed for anxiety or sleep. 04/19/15   Marletta Lor, MD  anastrozole (ARIMIDEX) 1 MG tablet Take 1 tablet (1 mg total) by mouth daily. 01/28/16   Nicholas Lose, MD  aspirin 81 MG tablet Take 81 mg by mouth at bedtime.    Historical Provider, MD  atenolol (TENORMIN) 100 MG tablet Take 1 tablet (100 mg total) by mouth daily. 01/28/16   Marletta Lor, MD  cephALEXin (KEFLEX) 500 MG capsule Take 1 capsule (500 mg total) by mouth  as needed. 1 after sex 04/19/15   Marletta Lor, MD  cholecalciferol (VITAMIN D) 1000 UNITS tablet Take 2,000 Units by mouth daily.     Historical Provider, MD  Coenzyme Q10 (CO Q 10 PO) Take 100 mg by mouth daily.    Historical Provider, MD  famotidine (PEPCID) 20 MG tablet Take 1 tablet (20 mg total) by mouth 2 (two) times daily. 02/05/16   Marletta Lor, MD  Fish Oil-Cholecalciferol (FISH OIL + D3 PO) Take 1 capsule by mouth daily.    Historical Provider, MD  irbesartan-hydrochlorothiazide (AVALIDE) 150-12.5 MG tablet Take 1 tablet by mouth daily. 01/28/16   Marletta Lor, MD  omega-3 acid ethyl esters (LOVAZA) 1 G capsule Take 1 g by mouth daily.    Historical Provider, MD  OVER THE COUNTER MEDICATION Take 1 tablet by mouth daily. [facility] stool softener daily    Historical  Provider, MD  simvastatin (ZOCOR) 20 MG tablet TAKE 1 TABLET (20 MG TOTAL) DAILY. 01/28/16   Marletta Lor, MD   BP 168/72 mmHg  Pulse 66  Temp(Src) 98.7 F (37.1 C) (Oral)  Resp 18  SpO2 99% Physical Exam  Constitutional: She is oriented to person, place, and time. She appears well-developed and well-nourished. No distress.  HENT:  Head: Normocephalic.  There is a 1.5 cm laceration to the left supraorbital ridge. It is well approximated and there is no active bleeding.   Eyes: EOM are normal.  Neck: Normal range of motion.  There is no cervical spine tenderness. Painless ROM in all directions.   Cardiovascular: Normal rate, regular rhythm and normal heart sounds.   Pulmonary/Chest: Effort normal and breath sounds normal. She exhibits tenderness.  There is mild tenderness to the left lateral chest wall. No crepitus or other palpable abnormality.   Abdominal: Soft. She exhibits no distension. There is no tenderness.  Musculoskeletal: Normal range of motion.  Neurological: She is alert and oriented to person, place, and time. No cranial nerve deficit. She exhibits normal muscle tone. Coordination normal.  Skin: Skin is warm and dry.  Psychiatric: She has a normal mood and affect. Judgment normal.  Nursing note and vitals reviewed.   ED Course  Procedures (including critical care time)  LACERATION REPAIR Performed by: Veryl Speak, MD Consent: Verbal consent obtained. Risks and benefits: risks, benefits and alternatives were discussed Patient identity confirmed: provided demographic data Time out performed prior to procedure Prepped and Draped in normal sterile fashion Wound explored Laceration Location: left supraorbital ridge Laceration Length: 1.5 cm No Foreign Bodies seen or palpated Anesthesia: local infiltration Amount of cleaning: standard Skin closure: dermabond Patient tolerance: Patient tolerated the procedure well with no immediate  complications.  DIAGNOSTIC STUDIES: Oxygen Saturation is 99% on RA,  normal by my interpretation.    COORDINATION OF CARE: 12:11 AM Discussed treatment plan which includes laceration repair with pt at bedside and pt agreed to plan.  Labs Review Labs Reviewed - No data to display  Imaging Review No results found.    EKG Interpretation None      MDM   Final diagnoses:  None    Patient presents after a trip and fall at church. She reports hitting her head on a pew while wearing her glasses causing a laceration above her left eye. She denies any loss of consciousness and neurologically she is intact. The laceration was repaired with Dermabond. She was offered a CT of head/facial bones, however she has declined this. She is also  complaining of discomfort in her left chest wall, however she is also declining chest x-ray and rib x-ray.  I personally performed the services described in this documentation, which was scribed in my presence. The recorded information has been reviewed and is accurate.       Veryl Speak, MD 02/14/16 [phone]

## 2016-02-29 ENCOUNTER — Ambulatory Visit
Admission: RE | Admit: 2016-02-29 | Discharge: 2016-02-29 | Disposition: A | Discharge status: Home / Self Care | Payer: PPO | Source: Ambulatory Visit | Attending: Hematology and Oncology | Admitting: Hematology and Oncology

## 2016-02-29 DIAGNOSIS — Z780 Asymptomatic menopausal state: Secondary | ICD-10-CM | POA: Diagnosis not present

## 2016-02-29 DIAGNOSIS — M85851 Other specified disorders of bone density and structure, right thigh: Secondary | ICD-10-CM | POA: Diagnosis not present

## 2016-02-29 DIAGNOSIS — M8580 Other specified disorders of bone density and structure, unspecified site: Secondary | ICD-10-CM

## 2016-03-03 ENCOUNTER — Telehealth: Payer: Self-pay | Admitting: Hematology and Oncology

## 2016-03-03 ENCOUNTER — Other Ambulatory Visit: Payer: Self-pay | Admitting: Hematology and Oncology

## 2016-03-03 ENCOUNTER — Telehealth: Payer: Self-pay | Admitting: *Deleted

## 2016-03-03 ENCOUNTER — Encounter (HOSPITAL_COMMUNITY): Payer: Self-pay

## 2016-03-03 NOTE — Telephone Encounter (Signed)
Received Low Risk results from [facility] on the Breast Cancer Index test.

## 2016-03-03 NOTE — Telephone Encounter (Signed)
Discussed with the patient the results of bone density test showing a T score of -2.4 in the femur which is much worse than 2015 when Alexandra Calderon had a T score of -1.5. I suspect this is antiestrogen therapy related. I also discussed the result of the breast cancer and back showing that Alexandra Calderon has low likelihood of breast relapse of 1.4% between years prior to 10 and that Alexandra Calderon does not have any benefit from extended adjuvant therapy.  Plan: 1. Stop anastrozole 2. Patient is reluctant to take bisphosphonate therapy. 3. Continue with calcium and vitamin D that increasing the calcium dosage to twice a day

## 2016-04-23 ENCOUNTER — Other Ambulatory Visit (INDEPENDENT_AMBULATORY_CARE_PROVIDER_SITE_OTHER): Payer: PPO

## 2016-04-23 DIAGNOSIS — Z0000 Encounter for general adult medical examination without abnormal findings: Secondary | ICD-10-CM

## 2016-04-23 LAB — BASIC METABOLIC PANEL WITH GFR
BUN: 15 mg/dL (ref 6–23)
CO2: 32 meq/L (ref 19–32)
Calcium: 9.3 mg/dL (ref 8.4–10.5)
Chloride: 104 meq/L (ref 96–112)
Creatinine, Ser: 0.83 mg/dL (ref 0.40–1.20)
GFR: 71.58 mL/min
Glucose, Bld: 102 mg/dL — ABNORMAL HIGH (ref 70–99)
Potassium: 3.4 meq/L — ABNORMAL LOW (ref 3.5–5.1)
Sodium: 144 meq/L (ref 135–145)

## 2016-04-23 LAB — CBC WITH DIFFERENTIAL/PLATELET
Basophils Absolute: 0 10*3/uL (ref 0.0–0.1)
Basophils Relative: 0.4 % (ref 0.0–3.0)
Eosinophils Absolute: 0 10*3/uL (ref 0.0–0.7)
Eosinophils Relative: 1 % (ref 0.0–5.0)
HCT: 39.6 % (ref 36.0–46.0)
Hemoglobin: 13.2 g/dL (ref 12.0–15.0)
Lymphocytes Relative: 33.7 % (ref 12.0–46.0)
Lymphs Abs: 1.6 10*3/uL (ref 0.7–4.0)
MCHC: 33.3 g/dL (ref 30.0–36.0)
MCV: 88.1 fl (ref 78.0–100.0)
Monocytes Absolute: 0.5 10*3/uL (ref 0.1–1.0)
Monocytes Relative: 11.4 % (ref 3.0–12.0)
Neutro Abs: 2.5 10*3/uL (ref 1.4–7.7)
Neutrophils Relative %: 53.5 % (ref 43.0–77.0)
Platelets: 190 10*3/uL (ref 150.0–400.0)
RBC: 4.49 Mil/uL (ref 3.87–5.11)
RDW: 14.3 % (ref 11.5–15.5)
WBC: 4.6 10*3/uL (ref 4.0–10.5)

## 2016-04-23 LAB — POC URINALSYSI DIPSTICK (AUTOMATED)
Bilirubin, UA: NEGATIVE
Glucose, UA: NEGATIVE
Ketones, UA: NEGATIVE
Nitrite, UA: POSITIVE
Spec Grav, UA: 1.02
Urobilinogen, UA: 1
pH, UA: 6.5

## 2016-04-23 LAB — TSH: TSH: 3.01 u[IU]/mL (ref 0.35–4.50)

## 2016-04-23 LAB — HEPATIC FUNCTION PANEL
ALT: 16 U/L (ref 0–35)
AST: 18 U/L (ref 0–37)
Albumin: 3.9 g/dL (ref 3.5–5.2)
Alkaline Phosphatase: 99 U/L (ref 39–117)
Bilirubin, Direct: 0.1 mg/dL (ref 0.0–0.3)
Total Bilirubin: 0.9 mg/dL (ref 0.2–1.2)
Total Protein: 7.1 g/dL (ref 6.0–8.3)

## 2016-04-23 LAB — LIPID PANEL
Cholesterol: 140 mg/dL (ref 0–200)
HDL: 34.9 mg/dL — ABNORMAL LOW
LDL Cholesterol: 77 mg/dL (ref 0–99)
NonHDL: 104.68
Total CHOL/HDL Ratio: 4
Triglycerides: 139 mg/dL (ref 0.0–149.0)
VLDL: 27.8 mg/dL (ref 0.0–40.0)

## 2016-04-28 ENCOUNTER — Encounter: Payer: Self-pay | Admitting: Internal Medicine

## 2016-04-28 ENCOUNTER — Ambulatory Visit (INDEPENDENT_AMBULATORY_CARE_PROVIDER_SITE_OTHER): Payer: PPO | Admitting: Internal Medicine

## 2016-04-28 VITALS — BP 130/80 | HR 59 | Temp 98.1°F | Ht 65.0 in | Wt 191.0 lb

## 2016-04-28 DIAGNOSIS — E785 Hyperlipidemia, unspecified: Secondary | ICD-10-CM | POA: Diagnosis not present

## 2016-04-28 DIAGNOSIS — M159 Polyosteoarthritis, unspecified: Secondary | ICD-10-CM

## 2016-04-28 DIAGNOSIS — Z860100 Personal history of colon polyps, unspecified: Secondary | ICD-10-CM

## 2016-04-28 DIAGNOSIS — I10 Essential (primary) hypertension: Secondary | ICD-10-CM

## 2016-04-28 DIAGNOSIS — M150 Primary generalized (osteo)arthritis: Secondary | ICD-10-CM | POA: Diagnosis not present

## 2016-04-28 DIAGNOSIS — Z0000 Encounter for general adult medical examination without abnormal findings: Secondary | ICD-10-CM | POA: Diagnosis not present

## 2016-04-28 DIAGNOSIS — M8949 Other hypertrophic osteoarthropathy, multiple sites: Secondary | ICD-10-CM

## 2016-04-28 NOTE — Progress Notes (Signed)
Subjective:    Patient ID: Alexandra Calderon, female    DOB: [birth year], 75 y.o.   MRN: [id number]  HPI   75 year-old patient who is seen today for a Medicare wellness visit.   Doing quite well.  She has had a recent follow-up colonoscopy last year.  Is followed closely by oncology.  Due to a history of right breast cancer.  Has regular mammograms . She has hypertension which has been stable. A bit frustrated by lack of weight loss  Past Medical History  Diagnosis Date  . Hypertension   . Osteopenia   . PVC (premature ventricular contraction)   . Hyperlipidemia   . Arthritis   . GERD (gastroesophageal reflux disease)   . Diverticulosis   . Kidney stones   . Pneumonia   . UTI (lower urinary tract infection)   . Allergy   . Hearing loss   . Palpitations   . Colon adenomas [id number]  . Esophagitis   . Polyp, stomach   . Irritable bowel syndrome 08/27/2012  . Breast cancer ([place])     right  . Constipation     Social History   Social History  . Marital Status: Married    Spouse Name: N/A  . Number of Children: 2  . Years of Education: N/A   Occupational History  . retired    Social History Main Topics  . Smoking status: Never Smoker   . Smokeless tobacco: Never Used  . Alcohol Use: No  . Drug Use: No  . Sexual Activity: Yes   Other Topics Concern  . Not on file   Social History Narrative   Spouse:  [name]   Children:   [name]- age 66- Clare- age 62- [name]    Past Surgical History  Procedure Laterality Date  . Kidney stones    . Dilation and curettage of uterus    . Shoulder arthroscopy rt 2009    . Shoulder surgery    . Breast lumpectomy  07/01/11    w/sentinal node bx - right side  . Colonoscopy w/ biopsies    . Esophagogastroduodenoscopy      Family History  Problem Relation Age of Onset  . Lymphoma Father   . Cancer Father   . Hodgkin's lymphoma Father   . Diabetes Maternal Aunt   . Diabetes Paternal  Grandmother   . Colon cancer Mother   . Colon polyps Neg Hx   . Rectal cancer Neg Hx   . Stomach cancer Neg Hx     Allergies  Allergen Reactions  . Levofloxacin     REACTION: rash  . Sulfamethoxazole-Trimethoprim Rash and Other (See Comments)    Chills, fever  . Sulfonamide Derivatives Rash and Other (See Comments)    Chills, fever    Current Outpatient Prescriptions on File Prior to Visit  Medication Sig Dispense Refill  . ALPRAZolam (XANAX) 0.25 MG tablet Take 1 tablet (0.25 mg total) by mouth at bedtime as needed for anxiety or sleep. 90 tablet 1  . aspirin 81 MG tablet Take 81 mg by mouth at bedtime.    Marland Kitchen atenolol (TENORMIN) 100 MG tablet Take 1 tablet (100 mg total) by mouth daily. 90 tablet 1  . cephALEXin (KEFLEX) 500 MG capsule Take 1 capsule (500 mg total) by mouth as needed. 1 after sex 90 capsule 1  . cholecalciferol (VITAMIN D) 1000 UNITS tablet Take 2,000 Units by mouth daily.     Marland Kitchen  Coenzyme Q10 (CO Q 10 PO) Take 100 mg by mouth daily.    . famotidine (PEPCID) 20 MG tablet Take 1 tablet (20 mg total) by mouth 2 (two) times daily. 60 tablet 4  . Fish Oil-Cholecalciferol (FISH OIL + D3 PO) Take 1 capsule by mouth daily.    . irbesartan-hydrochlorothiazide (AVALIDE) 150-12.5 MG tablet Take 1 tablet by mouth daily. 90 tablet 1  . omega-3 acid ethyl esters (LOVAZA) 1 G capsule Take 1 g by mouth daily.    Marland Kitchen OVER THE COUNTER MEDICATION Take 1 tablet by mouth daily. [facility] stool softener daily    . simvastatin (ZOCOR) 20 MG tablet TAKE 1 TABLET (20 MG TOTAL) DAILY. 90 tablet 1   No current facility-administered medications on file prior to visit.    BP 130/80 mmHg  Pulse 59  Temp(Src) 98.1 F (36.7 C) (Oral)  Ht 5\' 5"  (1.651 m)  Wt 191 lb (86.637 kg)  BMI 31.78 kg/m2  SpO2 97%    1. Risk factors, based on past  M,S,F history.  Current vascular risk factors include a history of hypertension and dyslipidemia  2.  Physical activities: No activity restrictions,  although fairly sedentary  3.  Depression/mood: No history depression or mood disorder   4.  Hearing: Complains of tinnitus and very mild hearing deficit   5.  ADL's: Independent in all aspects of daily living  6.  Fall risk: Low  7.  Home safety: No problems identified  8.  Height weight, and visual acuity; height and weight stable no change in visual acuity  9.  Counseling: Modest weight loss heart healthy diet and regular exercise.  All encouraged  10. Lab orders based on risk factors: Laboratory profile reviewed  11. Referral : Follow-up oncology  12. Care plan: Heart healthy diet and aggressive risk factor modification discussed  13. Cognitive assessment: Alert and oriented with normal affect no cognitive dysfunction  14. Screening: Patient provided with a written and personalized 5-10 year screening schedule in the AVS.  patient will have a colonoscopy at five-year intervals.  Due to a history of colonic polyps.  Due to a history of breast cancer.  Will have annual mammograms.  Annual examinations.  Encouraged, as well as annual clinical exams with screening lab  15. Provider List Update: Primary care medicine ophthalmology oncology and GI  Review of Systems  Constitutional: Negative for fever, appetite change, fatigue and unexpected weight change.  HENT: Negative for congestion, dental problem, ear pain, hearing loss, mouth sores, nosebleeds, sinus pressure, sore throat, tinnitus, trouble swallowing and voice change.   Eyes: Negative for photophobia, pain, redness and visual disturbance.  Respiratory: Negative for cough, chest tightness and shortness of breath.   Cardiovascular: Negative for chest pain, palpitations and leg swelling.  Gastrointestinal: Negative for nausea, vomiting, abdominal pain, diarrhea, constipation, blood in stool, abdominal distention and rectal pain.  Genitourinary: Negative for dysuria, urgency, frequency, hematuria, flank pain, vaginal bleeding,  vaginal discharge, difficulty urinating, genital sores, vaginal pain, menstrual problem and pelvic pain.  Musculoskeletal: Positive for neck pain. Negative for back pain, arthralgias and neck stiffness.  Skin: Negative for rash.  Neurological: Negative for dizziness, syncope, speech difficulty, weakness, light-headedness, numbness and headaches.  Hematological: Negative for adenopathy. Does not bruise/bleed easily.  Psychiatric/Behavioral: Negative for suicidal ideas, behavioral problems, self-injury, dysphoric mood and agitation. The patient is not nervous/anxious.        Objective:   Physical Exam  Constitutional: She is oriented to person, place, and  time. She appears well-developed and well-nourished.  HENT:  Head: Normocephalic and atraumatic.  Right Ear: External ear normal.  Left Ear: External ear normal.  Mouth/Throat: Oropharynx is clear and moist.  Eyes: Conjunctivae and EOM are normal.  Neck: Normal range of motion. Neck supple. No JVD present. No thyromegaly present.  Cardiovascular: Normal rate, regular rhythm, normal heart sounds and intact distal pulses.   No murmur heard. Pulmonary/Chest: Effort normal and breath sounds normal. She has no wheezes. She has no rales.  Abdominal: Soft. Bowel sounds are normal. She exhibits no distension and no mass. There is no tenderness. There is no rebound and no guarding.  Musculoskeletal: Normal range of motion. She exhibits no edema or tenderness.  Neurological: She is alert and oriented to person, place, and time. She has normal reflexes. No cranial nerve deficit. She exhibits normal muscle tone. Coordination normal.  Skin: Skin is warm and dry. No rash noted.  Psychiatric: She has a normal mood and affect. Her behavior is normal.          Assessment & Plan:   Preventive health exam Essential hypertension, stable Dyslipidemia.  Continue statin therapy History colonic polyps.  Follow-up colonoscopy in 4 years History of right  breast cancer.  Follow-up oncology.  Continue annual mammograms  Recheck 6 months Medications updated   Nyoka Cowden, MD

## 2016-04-28 NOTE — Patient Instructions (Signed)
Limit your sodium (Salt) intake  Please check your blood pressure on a regular basis.  If it is consistently greater than 150/90, please make an office appointment.    It is important that you exercise regularly, at least 20 minutes 3 to 4 times per week.  If you develop chest pain or shortness of breath seek  medical attention.  You need to lose weight.  Consider a lower calorie diet and regular exercise.   Oncology follow-up as scheduled

## 2016-04-28 NOTE — Progress Notes (Signed)
Pre visit review using our clinic review tool, if applicable. No additional management support is needed unless otherwise documented below in the visit note. 

## 2016-04-28 NOTE — Addendum Note (Signed)
Addended by: Jerl Santos R on: 04/28/2016 09:54 AM   Modules accepted: Orders, Medications

## 2016-05-21 ENCOUNTER — Other Ambulatory Visit: Payer: Self-pay | Admitting: Hematology and Oncology

## 2016-05-21 DIAGNOSIS — Z853 Personal history of malignant neoplasm of breast: Secondary | ICD-10-CM

## 2016-06-17 ENCOUNTER — Other Ambulatory Visit: Payer: Self-pay | Admitting: *Deleted

## 2016-06-17 MED ORDER — FAMOTIDINE 20 MG PO TABS: 20.0000 mg | ORAL_TABLET | Freq: Two times a day (BID) | ORAL | 1 refills | Status: DC

## 2016-06-17 NOTE — Telephone Encounter (Signed)
Rx done. 

## 2016-06-19 ENCOUNTER — Ambulatory Visit
Admission: RE | Admit: 2016-06-19 | Discharge: 2016-06-19 | Disposition: A | Discharge status: Home / Self Care | Payer: PPO | Source: Ambulatory Visit | Attending: Hematology and Oncology | Admitting: Hematology and Oncology

## 2016-06-19 DIAGNOSIS — R922 Inconclusive mammogram: Secondary | ICD-10-CM | POA: Diagnosis not present

## 2016-06-19 DIAGNOSIS — Z853 Personal history of malignant neoplasm of breast: Secondary | ICD-10-CM

## 2016-06-25 ENCOUNTER — Ambulatory Visit (INDEPENDENT_AMBULATORY_CARE_PROVIDER_SITE_OTHER): Payer: PPO | Admitting: *Deleted

## 2016-06-25 DIAGNOSIS — Z23 Encounter for immunization: Secondary | ICD-10-CM | POA: Diagnosis not present

## 2016-07-17 DIAGNOSIS — N898 Other specified noninflammatory disorders of vagina: Secondary | ICD-10-CM | POA: Diagnosis not present

## 2016-07-17 DIAGNOSIS — Z6831 Body mass index (BMI) 31.0-31.9, adult: Secondary | ICD-10-CM | POA: Diagnosis not present

## 2016-07-17 DIAGNOSIS — N941 Unspecified dyspareunia: Secondary | ICD-10-CM | POA: Diagnosis not present

## 2016-07-17 DIAGNOSIS — Z01419 Encounter for gynecological examination (general) (routine) without abnormal findings: Secondary | ICD-10-CM | POA: Diagnosis not present

## 2016-07-17 DIAGNOSIS — N951 Menopausal and female climacteric states: Secondary | ICD-10-CM | POA: Diagnosis not present

## 2016-07-17 DIAGNOSIS — Z853 Personal history of malignant neoplasm of breast: Secondary | ICD-10-CM | POA: Diagnosis not present

## 2016-07-28 ENCOUNTER — Other Ambulatory Visit: Payer: Self-pay | Admitting: Internal Medicine

## 2016-08-05 ENCOUNTER — Encounter: Payer: Self-pay | Admitting: Internal Medicine

## 2016-08-26 DIAGNOSIS — X32XXXD Exposure to sunlight, subsequent encounter: Secondary | ICD-10-CM | POA: Diagnosis not present

## 2016-08-26 DIAGNOSIS — L738 Other specified follicular disorders: Secondary | ICD-10-CM | POA: Diagnosis not present

## 2016-08-26 DIAGNOSIS — L570 Actinic keratosis: Secondary | ICD-10-CM | POA: Diagnosis not present

## 2016-08-26 DIAGNOSIS — L821 Other seborrheic keratosis: Secondary | ICD-10-CM | POA: Diagnosis not present

## 2016-10-10 ENCOUNTER — Ambulatory Visit (INDEPENDENT_AMBULATORY_CARE_PROVIDER_SITE_OTHER): Payer: PPO | Admitting: Adult Health

## 2016-10-10 ENCOUNTER — Encounter: Payer: Self-pay | Admitting: Adult Health

## 2016-10-10 VITALS — BP 132/76 | Temp 98.4°F | Ht 65.0 in | Wt 196.0 lb

## 2016-10-10 DIAGNOSIS — Z760 Encounter for issue of repeat prescription: Secondary | ICD-10-CM | POA: Diagnosis not present

## 2016-10-10 DIAGNOSIS — N3001 Acute cystitis with hematuria: Secondary | ICD-10-CM

## 2016-10-10 LAB — POCT URINALYSIS DIPSTICK
Bilirubin, UA: NEGATIVE
Glucose, UA: NEGATIVE
Ketones, UA: NEGATIVE
Protein, UA: NEGATIVE
Spec Grav, UA: 1.02
Urobilinogen, UA: 0.2
pH, UA: 5

## 2016-10-10 MED ORDER — CEPHALEXIN 500 MG PO CAPS: 500.0000 mg | ORAL_CAPSULE | Freq: Once | ORAL | 1 refills | Status: AC

## 2016-10-10 NOTE — Progress Notes (Signed)
Subjective:    Patient ID: Alexandra Calderon, female    DOB: [birth year], 75 y.o.   MRN: [id number]  HPI  75 year female who presents to the office today for possible UTI. She reports that she takes Keflex after sex for UTI prophylaxis and she has not had any Keflex and was told that she would need to come in for refills. She has had intercourse twice since being out of Keflex.   Today she is worried that she may have a UTI. Her symptoms are slight dysuria and pelvic pain.   Review of Systems  All other systems reviewed and are negative.  See HPI  Past Medical History:  Diagnosis Date  . Allergy   . Arthritis   . Breast cancer ([place])    right  . Colon adenomas [id number]  . Constipation   . Diverticulosis   . Esophagitis   . GERD (gastroesophageal reflux disease)   . Hearing loss   . Hyperlipidemia   . Hypertension   . Irritable bowel syndrome 08/27/2012  . Kidney stones   . Osteopenia   . Palpitations   . Pneumonia   . Polyp, stomach   . PVC (premature ventricular contraction)   . UTI (lower urinary tract infection)     Social History   Social History  . Marital status: Married    Spouse name: N/A  . Number of children: 2  . Years of education: N/A   Occupational History  . retired    Social History Main Topics  . Smoking status: Never Smoker  . Smokeless tobacco: Never Used  . Alcohol use No  . Drug use: No  . Sexual activity: Yes   Other Topics Concern  . Not on file   Social History Narrative   Spouse:  [name]   Children:   [name]- age 71- [place]- age 32- [name]    Past Surgical History:  Procedure Laterality Date  . BREAST LUMPECTOMY  07/01/11   w/sentinal node bx - right side  . COLONOSCOPY W/ BIOPSIES    . DILATION AND CURETTAGE OF UTERUS    . ESOPHAGOGASTRODUODENOSCOPY    . kidney stones    . shoulder arthroscopy rt 2009    . SHOULDER SURGERY      Family History  Problem Relation Age of Onset  . Lymphoma  Father   . Cancer Father   . Hodgkin's lymphoma Father   . Diabetes Maternal Aunt   . Diabetes Paternal Grandmother   . Colon cancer Mother   . Colon polyps Neg Hx   . Rectal cancer Neg Hx   . Stomach cancer Neg Hx     Allergies  Allergen Reactions  . Levofloxacin     REACTION: rash  . Sulfamethoxazole-Trimethoprim Rash and Other (See Comments)    Chills, fever  . Sulfonamide Derivatives Rash and Other (See Comments)    Chills, fever    Current Outpatient Prescriptions on File Prior to Visit  Medication Sig Dispense Refill  . ALPRAZolam (XANAX) 0.25 MG tablet Take 1 tablet (0.25 mg total) by mouth at bedtime as needed for anxiety or sleep. 90 tablet 1  . aspirin 81 MG tablet Take 81 mg by mouth at bedtime.    Marland Kitchen atenolol (TENORMIN) 100 MG tablet TAKE 1 TABLET BY MOUTH EVERY DAY 90 tablet 1  . cholecalciferol (VITAMIN D) 1000 UNITS tablet Take 2,000 Units by mouth daily.     . Coenzyme Q10 (CO  Q 10 PO) Take 100 mg by mouth daily.    . famotidine (PEPCID) 20 MG tablet Take 1 tablet (20 mg total) by mouth 2 (two) times daily. 180 tablet 1  . Fish Oil-Cholecalciferol (FISH OIL + D3 PO) Take 1 capsule by mouth daily.    . irbesartan-hydrochlorothiazide (AVALIDE) 150-12.5 MG tablet TAKE 1 TABLET BY MOUTH EVERY DAY 90 tablet 1  . omega-3 acid ethyl esters (LOVAZA) 1 G capsule Take 1 g by mouth daily.    Marland Kitchen OVER THE COUNTER MEDICATION Take 1 tablet by mouth daily. [facility] stool softener daily    . simvastatin (ZOCOR) 20 MG tablet TAKE 1 TABLET BY MOUTH EVERY DAY 90 tablet 1   No current facility-administered medications on file prior to visit.     BP 132/76   Temp 98.4 F (36.9 C) (Oral)   Ht 5\' 5"  (1.651 m)   Wt 196 lb (88.9 kg)   BMI 32.62 kg/m        Objective:   Physical Exam  Constitutional: She is oriented to person, place, and time. She appears well-developed and well-nourished. No distress.  Cardiovascular: Normal rate, regular rhythm, normal heart sounds and intact  distal pulses.  Exam reveals no gallop and no friction rub.   No murmur heard. Pulmonary/Chest: Effort normal and breath sounds normal. No respiratory distress. She has no wheezes. She has no rales. She exhibits no tenderness.  Abdominal: Soft. Bowel sounds are normal. She exhibits no distension and no mass. There is no tenderness. There is no rebound and no guarding.  Neurological: She is alert and oriented to person, place, and time.  Skin: Skin is warm and dry. No rash noted. She is not diaphoretic. No erythema. No pallor.  Psychiatric: She has a normal mood and affect. Her behavior is normal. Judgment and thought content normal.  Nursing note and vitals reviewed.     Assessment & Plan:  1. Acute cystitis with hematuria - POC Urinalysis Dipstick - + Leuks and + Nitrates - Culture, Urine - Will have her take Keflex TID x 4 days   2. Medication refill  - cephALEXin (KEFLEX) 500 MG capsule; Take 1 capsule (500 mg total) by mouth once. Take 1 as needed  Dispense: 90 capsule; Refill: 1  Dorothyann Peng, NP

## 2016-10-13 LAB — URINE CULTURE

## 2016-11-04 ENCOUNTER — Ambulatory Visit (INDEPENDENT_AMBULATORY_CARE_PROVIDER_SITE_OTHER): Payer: PPO | Admitting: Internal Medicine

## 2016-11-04 ENCOUNTER — Ambulatory Visit: Payer: PPO | Admitting: Internal Medicine

## 2016-11-04 ENCOUNTER — Encounter: Payer: Self-pay | Admitting: Internal Medicine

## 2016-11-04 VITALS — BP 130/62 | HR 70 | Temp 98.1°F | Ht 65.0 in | Wt 189.6 lb

## 2016-11-04 DIAGNOSIS — R002 Palpitations: Secondary | ICD-10-CM

## 2016-11-04 DIAGNOSIS — I10 Essential (primary) hypertension: Secondary | ICD-10-CM | POA: Diagnosis not present

## 2016-11-04 MED ORDER — ALPRAZOLAM 0.25 MG PO TABS: 0.2500 mg | ORAL_TABLET | Freq: Every evening | ORAL | 1 refills | Status: DC | PRN

## 2016-11-04 NOTE — Progress Notes (Signed)
Subjective:    Patient ID: Alexandra Calderon, female    DOB: [birth year], 76 y.o.   MRN: [id number]  HPI 76 year old patient who has a history of essential hypertension.  She has a history also of dyslipidemia.  She does monitor home blood pressure monitors.  Use in the morning with occasionally slightly high systolic readings. In general doing well.  Past Medical History:  Diagnosis Date  . Allergy   . Arthritis   . Breast cancer ([place])    right  . Colon adenomas [id number]  . Constipation   . Diverticulosis   . Esophagitis   . GERD (gastroesophageal reflux disease)   . Hearing loss   . Hyperlipidemia   . Hypertension   . Irritable bowel syndrome 08/27/2012  . Kidney stones   . Osteopenia   . Palpitations   . Pneumonia   . Polyp, stomach   . PVC (premature ventricular contraction)   . UTI (lower urinary tract infection)      Social History   Social History  . Marital status: Married    Spouse name: N/A  . Number of children: 2  . Years of education: N/A   Occupational History  . retired    Social History Main Topics  . Smoking status: Never Smoker  . Smokeless tobacco: Never Used  . Alcohol use No  . Drug use: No  . Sexual activity: Yes   Other Topics Concern  . Not on file   Social History Narrative   Spouse:  [name]   Children:   [name]- age 85- [place]- age 60- [name]    Past Surgical History:  Procedure Laterality Date  . BREAST LUMPECTOMY  07/01/11   w/sentinal node bx - right side  . COLONOSCOPY W/ BIOPSIES    . DILATION AND CURETTAGE OF UTERUS    . ESOPHAGOGASTRODUODENOSCOPY    . kidney stones    . shoulder arthroscopy rt 2009    . SHOULDER SURGERY      Family History  Problem Relation Age of Onset  . Lymphoma Father   . Cancer Father   . Hodgkin's lymphoma Father   . Diabetes Maternal Aunt   . Diabetes Paternal Grandmother   . Colon cancer Mother   . Colon polyps Neg Hx   . Rectal cancer Neg Hx   .  Stomach cancer Neg Hx     Allergies  Allergen Reactions  . Levofloxacin     REACTION: rash  . Sulfamethoxazole-Trimethoprim Rash and Other (See Comments)    Chills, fever  . Sulfonamide Derivatives Rash and Other (See Comments)    Chills, fever    Current Outpatient Prescriptions on File Prior to Visit  Medication Sig Dispense Refill  . aspirin 81 MG tablet Take 81 mg by mouth at bedtime.    Marland Kitchen atenolol (TENORMIN) 100 MG tablet TAKE 1 TABLET BY MOUTH EVERY DAY 90 tablet 1  . Coenzyme Q10 (CO Q 10 PO) Take 100 mg by mouth daily.    . famotidine (PEPCID) 20 MG tablet Take 1 tablet (20 mg total) by mouth 2 (two) times daily. 180 tablet 1  . irbesartan-hydrochlorothiazide (AVALIDE) 150-12.5 MG tablet TAKE 1 TABLET BY MOUTH EVERY DAY 90 tablet 1  . OVER THE COUNTER MEDICATION Take 1 tablet by mouth daily. [facility] stool softener daily    . simvastatin (ZOCOR) 20 MG tablet TAKE 1 TABLET BY MOUTH EVERY DAY 90 tablet 1   No current facility-administered  medications on file prior to visit.     BP 130/62 (BP Location: Right Arm, Patient Position: Sitting, Cuff Size: Normal)   Pulse 70   Temp 98.1 F (36.7 C) (Oral)   Ht 5\' 5"  (1.651 m)   Wt 189 lb 9.6 oz (86 kg)   SpO2 98%   BMI 31.55 kg/m      Review of Systems  Constitutional: Negative.   HENT: Negative for congestion, dental problem, hearing loss, rhinorrhea, sinus pressure, sore throat and tinnitus.   Eyes: Negative for pain, discharge and visual disturbance.  Respiratory: Negative for cough and shortness of breath.   Cardiovascular: Negative for chest pain, palpitations and leg swelling.  Gastrointestinal: Negative for abdominal distention, abdominal pain, blood in stool, constipation, diarrhea, nausea and vomiting.  Genitourinary: Negative for difficulty urinating, dysuria, flank pain, frequency, hematuria, pelvic pain, urgency, vaginal bleeding, vaginal discharge and vaginal pain.  Musculoskeletal: Negative for arthralgias,  gait problem and joint swelling.  Skin: Negative for rash.  Neurological: Negative for dizziness, syncope, speech difficulty, weakness, numbness and headaches.  Hematological: Negative for adenopathy.  Psychiatric/Behavioral: Negative for agitation, behavioral problems and dysphoric mood. The patient is not nervous/anxious.        Objective:   Physical Exam  Constitutional: She is oriented to person, place, and time. She appears well-developed and well-nourished.  Blood pressure 122/82  HENT:  Head: Normocephalic.  Right Ear: External ear normal.  Left Ear: External ear normal.  Mouth/Throat: Oropharynx is clear and moist.  Eyes: Conjunctivae and EOM are normal. Pupils are equal, round, and reactive to light.  Neck: Normal range of motion. Neck supple. No thyromegaly present.  Cardiovascular: Normal rate, regular rhythm, normal heart sounds and intact distal pulses.   Pulmonary/Chest: Effort normal and breath sounds normal.  Abdominal: Soft. Bowel sounds are normal. She exhibits no mass. There is no tenderness.  Musculoskeletal: Normal range of motion.  Lymphadenopathy:    She has no cervical adenopathy.  Neurological: She is alert and oriented to person, place, and time.  Skin: Skin is warm and dry. No rash noted.  Psychiatric: She has a normal mood and affect. Her behavior is normal.          Assessment & Plan:   Essential hypertension.  Appears to be well controlled.  We'll continue triple therapy Dyslipidemia.  Continue statin therapy History of breast cancer.  Follow-up oncology  CPX here 6 months  Panagiotis Oelkers Pilar Plate

## 2016-11-04 NOTE — Patient Instructions (Signed)
Limit your sodium (Salt) intake  Please check your blood pressure on a regular basis.  If it is consistently greater than 150/90, please make an office appointment.    It is important that you exercise regularly, at least 20 minutes 3 to 4 times per week.  If you develop chest pain or shortness of breath seek  medical attention.  Take a calcium supplement, plus 800-1200 units of vitamin D 

## 2016-11-04 NOTE — Progress Notes (Signed)
Pre visit review using our clinic review tool, if applicable. No additional management support is needed unless otherwise documented below in the visit note. 

## 2017-01-19 ENCOUNTER — Other Ambulatory Visit: Payer: Self-pay | Admitting: Internal Medicine

## 2017-01-21 ENCOUNTER — Other Ambulatory Visit: Payer: Self-pay | Admitting: Internal Medicine

## 2017-02-04 ENCOUNTER — Telehealth: Payer: Self-pay | Admitting: Internal Medicine

## 2017-02-04 ENCOUNTER — Other Ambulatory Visit: Payer: Self-pay | Admitting: Internal Medicine

## 2017-02-04 DIAGNOSIS — I10 Essential (primary) hypertension: Secondary | ICD-10-CM

## 2017-02-04 DIAGNOSIS — R002 Palpitations: Secondary | ICD-10-CM

## 2017-02-04 NOTE — Telephone Encounter (Signed)
Pt last seen dr allred over 5 years ago for heart palps. Pt needs a referral to see dr Jeneen Rinks allred [phone]

## 2017-02-04 NOTE — Telephone Encounter (Signed)
Referral order placed in epic

## 2017-02-04 NOTE — Telephone Encounter (Signed)
Please see message below, Ok for cardio referral? Please advise

## 2017-02-04 NOTE — Telephone Encounter (Signed)
Okay for cardiology follow-up

## 2017-02-04 NOTE — Telephone Encounter (Signed)
Called pt to make aware no answer so a detailed voice message was left to call office back.

## 2017-02-06 ENCOUNTER — Encounter: Payer: Self-pay | Admitting: Internal Medicine

## 2017-02-10 ENCOUNTER — Ambulatory Visit (INDEPENDENT_AMBULATORY_CARE_PROVIDER_SITE_OTHER): Payer: PPO | Admitting: Cardiovascular Disease

## 2017-02-10 ENCOUNTER — Encounter: Payer: Self-pay | Admitting: Cardiovascular Disease

## 2017-02-10 VITALS — BP 150/80 | HR 63 | Ht 65.0 in | Wt 193.6 lb

## 2017-02-10 DIAGNOSIS — I10 Essential (primary) hypertension: Secondary | ICD-10-CM

## 2017-02-10 DIAGNOSIS — E782 Mixed hyperlipidemia: Secondary | ICD-10-CM | POA: Diagnosis not present

## 2017-02-10 DIAGNOSIS — I493 Ventricular premature depolarization: Secondary | ICD-10-CM | POA: Diagnosis not present

## 2017-02-10 LAB — BASIC METABOLIC PANEL WITH GFR
BUN/Creatinine Ratio: 13 (ref 12–28)
BUN: 11 mg/dL (ref 8–27)
CO2: 29 mmol/L (ref 18–29)
Calcium: 9.3 mg/dL (ref 8.7–10.3)
Chloride: 97 mmol/L (ref 96–106)
Creatinine, Ser: 0.82 mg/dL (ref 0.57–1.00)
GFR calc Af Amer: 82 mL/min/{1.73_m2}
GFR calc non Af Amer: 71 mL/min/{1.73_m2}
Glucose: 102 mg/dL — ABNORMAL HIGH (ref 65–99)
Potassium: 3.6 mmol/L (ref 3.5–5.2)
Sodium: 142 mmol/L (ref 134–144)

## 2017-02-10 LAB — TSH: TSH: 2.28 u[IU]/mL (ref 0.450–4.500)

## 2017-02-10 MED ORDER — SPIRONOLACTONE 25 MG PO TABS: 25.0000 mg | ORAL_TABLET | Freq: Every day | ORAL | 11 refills | Status: DC

## 2017-02-10 MED ORDER — LOSARTAN POTASSIUM 25 MG PO TABS: 25.0000 mg | ORAL_TABLET | Freq: Every day | ORAL | 11 refills | Status: DC

## 2017-02-10 NOTE — Patient Instructions (Addendum)
Medication Instructions:  STOP Irbesartan/HCTZ (Avalide) START Losartan (Cozaar) 50 mg once daily START Aldactone (Spironolactone) 25 mg once daily   Labwork: TODAY - TSH, BMET  Your physician recommends that you return for lab work in: 2 weeks for bmet   Testing/Procedures: None Ordered   Follow-Up: Your physician recommends that you schedule a follow-up appointment in: 3 months with Dr. Acie Fredrickson   If you need a refill on your cardiac medications before your next appointment, please call your pharmacy.   Thank you for choosing [facility]! Christen Bame, RN [phone]

## 2017-02-10 NOTE — Progress Notes (Signed)
Cardiology Office Note   Date:  02/10/2017   ID:  Alexandra Calderon, Alexandra Calderon 03/17/1941, MRN [id number]  PCP:  Nyoka Cowden, MD  Cardiologist:   Mertie Moores, MD   Chief Complaint  Patient presents with  . Palpitations   Problem List 1. Essential HTN 2. Palpitations - PVCs  3. Hyperlipidemia  4. Irritable bowel syndrome     History of Present Illness:  Alexandra Calderon is a 76 y.o. female who is being seen today for the evaluation of palpitations  at the request of Marletta Lor, MD.  Seen with her husband   [name]  Has had palpitations all of her life.    Over the years, they have been stable for years.  Dr. Raliegh Ip increased her atenolol which seemed to have helped.   Has rare , isolated "thumps"  And then normal HR for a while These episodes may go on for several days Seem to be worse after eating , in the evening .  Do not limit her activity .  Not associated with weakness or dizziness, no dyspnea.    Does not get any regular exercise - tries to walk some   Past Medical History:  Diagnosis Date  . Allergy   . Arthritis   . Breast cancer ([place])    right  . Colon adenomas [id number]  . Constipation   . Diverticulosis   . Esophagitis   . GERD (gastroesophageal reflux disease)   . Hearing loss   . Hyperlipidemia   . Hypertension   . Irritable bowel syndrome 08/27/2012  . Kidney stones   . Osteopenia   . Palpitations   . Pneumonia   . Polyp, stomach   . PVC (premature ventricular contraction)   . UTI (lower urinary tract infection)     Past Surgical History:  Procedure Laterality Date  . BREAST LUMPECTOMY  07/01/11   w/sentinal node bx - right side  . COLONOSCOPY W/ BIOPSIES    . DILATION AND CURETTAGE OF UTERUS    . ESOPHAGOGASTRODUODENOSCOPY    . kidney stones    . shoulder arthroscopy rt 2009    . SHOULDER SURGERY       Current Outpatient Prescriptions  Medication Sig Dispense Refill  . ALPRAZolam (XANAX) 0.25 MG tablet Take 1 tablet  (0.25 mg total) by mouth at bedtime as needed for anxiety or sleep. 90 tablet 1  . aspirin 81 MG tablet Take 81 mg by mouth at bedtime.    Marland Kitchen atenolol (TENORMIN) 50 MG tablet TAKE 2 TABLETS BY MOUTH EVERY DAY 180 tablet 1  . cholecalciferol (VITAMIN D) 1000 units tablet Take 2,000 Units by mouth daily.    . Coenzyme Q10 (CO Q 10 PO) Take 100 mg by mouth daily.    . famotidine (PEPCID) 20 MG tablet Take 1 tablet (20 mg total) by mouth 2 (two) times daily. 180 tablet 1  . irbesartan-hydrochlorothiazide (AVALIDE) 150-12.5 MG tablet TAKE 1 TABLET BY MOUTH EVERY DAY 90 tablet 1  . OVER THE COUNTER MEDICATION Take 1 tablet by mouth daily. [facility] stool softener daily    . simvastatin (ZOCOR) 20 MG tablet TAKE 1 TABLET BY MOUTH EVERY DAY 90 tablet 1   No current facility-administered medications for this visit.     PAD Screen 02/10/2017  Previous PAD dx? No  Previous surgical procedure? No  Pain with walking? No  Feet/toe relief with dangling? No  Painful, non-healing ulcers? No  Extremities discolored? No  Allergies:   Levofloxacin; Sulfamethoxazole-trimethoprim; and Sulfonamide derivatives    Social History:  The patient  reports that she has never smoked. She has never used smokeless tobacco. She reports that she does not drink alcohol or use drugs.   Family History:  The patient's family history includes Cancer in her father; Colon cancer in her mother; Diabetes in her maternal aunt and paternal grandmother; Hodgkin's lymphoma in her father; Lymphoma in her father.    ROS:  Please see the history of present illness.    Review of Systems: Constitutional:  denies fever, chills, diaphoresis, appetite change and fatigue.  HEENT: denies photophobia, eye pain, redness, hearing loss, ear pain, congestion, sore throat, rhinorrhea, sneezing, neck pain, neck stiffness and tinnitus.  Respiratory: denies SOB, DOE, cough, chest tightness, and wheezing.  Cardiovascular: admits to    palpitations  .  Gastrointestinal: denies nausea, vomiting, abdominal pain, diarrhea, constipation, blood in stool.  Genitourinary: denies dysuria, urgency, frequency, hematuria, flank pain and difficulty urinating.  Musculoskeletal: denies  myalgias, back pain, joint swelling, arthralgias and gait problem.   Skin: denies pallor, rash and wound.  Neurological: denies dizziness, seizures, syncope, weakness, light-headedness, numbness and headaches.   Hematological: denies adenopathy, easy bruising, personal or family bleeding history.  Psychiatric/ Behavioral: denies suicidal ideation, mood changes, confusion, nervousness, sleep disturbance and agitation.       All other systems are reviewed and negative.    PHYSICAL EXAM: VS:  BP (!) 150/80 (BP Location: Left Arm, Patient Position: Sitting, Cuff Size: Normal)   Pulse 63   Ht 5\' 5"  (1.651 m)   Wt 193 lb 9.6 oz (87.8 kg)   SpO2 96%   BMI 32.22 kg/m  , BMI Body mass index is 32.22 kg/m. GEN: Well nourished, well developed, in no acute distress  HEENT: normal  Neck: no JVD, carotid bruits, or masses Cardiac: RRR; no murmurs, rubs, or gallops,no edema  Respiratory:  clear to auscultation bilaterally, normal work of breathing GI: soft, nontender, nondistended, + BS MS: no deformity or atrophy  Skin: warm and dry, no rash Neuro:  Strength and sensation are intact Psych: normal   EKG:  EKG is ordered today. The ekg ordered today demonstrates  [employer] RBBB , mild voltage for LVH     Recent Labs: 04/23/2016: ALT 16; BUN 15; Creatinine, Ser 0.83; Hemoglobin 13.2; Platelets 190.0; Potassium 3.4; Sodium 144; TSH 3.01    Lipid Panel    Component Value Date/Time   CHOL 140 04/23/2016 0805   TRIG 139.0 04/23/2016 0805   HDL 34.90 (L) 04/23/2016 0805   CHOLHDL 4 04/23/2016 0805   VLDL 27.8 04/23/2016 0805   LDLCALC 77 04/23/2016 0805      Wt Readings from Last 3 Encounters:  02/10/17 193 lb 9.6 oz (87.8 kg)  11/04/16 189 lb  9.6 oz (86 kg)  10/10/16 196 lb (88.9 kg)      Other studies Reviewed: Additional studies/ records that were reviewed today include: . Review of the above records demonstrates:    ASSESSMENT AND PLAN:  1.  Palpitations - Clinically her palpitations are consistent with premature ventricular contractions. Her potassium has been low in the past.  We suggested adding a potassium chloride supplement to her medications but she thinks that she might have trouble swallowing the tablet. As an alternative, we'll discontinue the irbesartan-HCTZ and start her on losartan 50 mg a day and Aldactone 25 mg a day. We'll check a basic medical profile today and a TSH today.  We'll check a significant medical profile in 2 weeks.  2. Hypertension: Continue atenolol. Because she has more palpitations at night I've suggested that she take the atenolol 50 mg  twice a day. We'll be substituting losartan and Aldactone instead of the irbesartan HCTZ.   Current medicines are reviewed at length with the patient today.  The patient does not have concerns regarding medicines.  Labs/ tests ordered today include:  No orders of the defined types were placed in this encounter.    Disposition:   FU with me in 3 months .      Mertie Moores, MD  02/10/2017 9:35 AM    [facility] [place], [address], [place]  [address] Phone: [phone]; Fax: [phone]

## 2017-02-10 NOTE — Assessment & Plan Note (Signed)
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 Patient is status post lumpectomy with sentinel lymph node biopsy with one of 2 lymph nodes positive for micrometastatic disease.status post radiation therapy currently on antiestrogen therapy with Arimidex started in December 2012  Anastrozole toxicities: 1. Bone density January 2015: Osteopenia T score -1.6 in the arm, hip T score -0.8: We will order bone density to be done at the breast center  We discussed the issue regarding duration of therapy. I discussed the results of in May 17 clinical trial showing 4% absolute decrease in relapse rate for continuation of aromatase inhibitor therapy to 10 years.  Breast Cancer Surveillance: 1. Breast exam 02/11/2017: Normal 2. Mammogram 06/19/2016 No abnormalities. Postsurgical changes. Breast Density Category B. I recommended that she get 3-D mammograms for surveillance. Discussed the differences between different breast density categories.  Return to clinic in 1 year for follow-up

## 2017-02-11 ENCOUNTER — Ambulatory Visit (HOSPITAL_BASED_OUTPATIENT_CLINIC_OR_DEPARTMENT_OTHER): Payer: PPO | Admitting: Hematology and Oncology

## 2017-02-11 ENCOUNTER — Encounter: Payer: Self-pay | Admitting: Hematology and Oncology

## 2017-02-11 VITALS — BP 132/55 | HR 60 | Temp 98.0°F | Resp 18 | Wt 193.7 lb

## 2017-02-11 DIAGNOSIS — C50211 Malignant neoplasm of upper-inner quadrant of right female breast: Secondary | ICD-10-CM | POA: Diagnosis not present

## 2017-02-11 DIAGNOSIS — Z170 Estrogen receptor positive status [ER+]: Secondary | ICD-10-CM

## 2017-02-11 DIAGNOSIS — Z780 Asymptomatic menopausal state: Secondary | ICD-10-CM

## 2017-02-11 NOTE — Progress Notes (Signed)
Patient Care Team: Marletta Lor, MD as PCP - General (Internal Medicine)  DIAGNOSIS:  Encounter Diagnosis  Name Primary?  . Malignant neoplasm of upper-inner quadrant of right breast in female, estrogen receptor positive ([place])     SUMMARY OF ONCOLOGIC HISTORY:   Breast cancer of upper-inner quadrant of right female breast ([place])   06/11/2011 Initial Diagnosis    Right breast mass biopsy: Invasive mammary cancer      07/01/2011 Surgery    Right breast lumpectomy: 2.2 cm IDC grade 1, 0/1 lymph node, ER/PR positive HER-2 negative Ki-67 16% T2 N0 M0 stage II a      08/15/2011 - 09/25/2011 Radiation Therapy    Adjuvant radiation therapy by Dr. Valere Dross      09/15/2011 -  Anti-estrogen oral therapy    Arimidex 1 mg daily 5 years       CHIEF COMPLIANT: Follow-up on Arimidex therapy  INTERVAL HISTORY: Alexandra Calderon is a 76 year old with above-mentioned history of right breast cancer currently on adjuvant antiestrogen therapy with Arimidex. She is tolerating the treatment extremely well without any major pain or discomfort. Denies hot flashes but denies lumps or nodules in the breast.  REVIEW OF SYSTEMS:   Constitutional: Denies fevers, chills or abnormal weight loss Eyes: Denies blurriness of vision Ears, nose, mouth, throat, and face: Denies mucositis or sore throat Respiratory: Denies cough, dyspnea or wheezes Cardiovascular: Denies palpitation, chest discomfort Gastrointestinal:  Denies nausea, heartburn or change in bowel habits Skin: Denies abnormal skin rashes Lymphatics: Denies new lymphadenopathy or easy bruising Neurological:Denies numbness, tingling or new weaknesses Behavioral/Psych: Mood is stable, no new changes  Extremities: No lower extremity edema Breast:  denies any pain or lumps or nodules in either breasts All other systems were reviewed with the patient and are negative.  I have reviewed the past medical history, past surgical history, social history  and family history with the patient and they are unchanged from previous note.  ALLERGIES:  is allergic to levofloxacin; sulfamethoxazole-trimethoprim; and sulfonamide derivatives.  MEDICATIONS:  Current Outpatient Prescriptions  Medication Sig Dispense Refill  . ALPRAZolam (XANAX) 0.25 MG tablet Take 1 tablet (0.25 mg total) by mouth at bedtime as needed for anxiety or sleep. 90 tablet 1  . aspirin 81 MG tablet Take 81 mg by mouth at bedtime.    Marland Kitchen atenolol (TENORMIN) 50 MG tablet TAKE 2 TABLETS BY MOUTH EVERY DAY 180 tablet 1  . cholecalciferol (VITAMIN D) 1000 units tablet Take 2,000 Units by mouth daily.    . Coenzyme Q10 (CO Q 10 PO) Take 100 mg by mouth daily.    . famotidine (PEPCID) 20 MG tablet Take 1 tablet (20 mg total) by mouth 2 (two) times daily. 180 tablet 1  . losartan (COZAAR) 25 MG tablet Take 1 tablet (25 mg total) by mouth daily. 30 tablet 11  . OVER THE COUNTER MEDICATION Take 1 tablet by mouth daily. [facility] stool softener daily    . simvastatin (ZOCOR) 20 MG tablet TAKE 1 TABLET BY MOUTH EVERY DAY 90 tablet 1  . spironolactone (ALDACTONE) 25 MG tablet Take 1 tablet (25 mg total) by mouth daily. 30 tablet 11   No current facility-administered medications for this visit.     PHYSICAL EXAMINATION: ECOG PERFORMANCE STATUS: 0 - Asymptomatic  Vitals:   02/11/17 1007  BP: (!) 132/55  Pulse: 60  Resp: 18  Temp: 98 F (36.7 C)   Filed Weights   02/11/17 1007  Weight: 193 lb 11.2  oz (87.9 kg)    GENERAL:alert, no distress and comfortable SKIN: skin color, texture, turgor are normal, no rashes or significant lesions EYES: normal, Conjunctiva are pink and non-injected, sclera clear OROPHARYNX:no exudate, no erythema and lips, buccal mucosa, and tongue normal  NECK: supple, thyroid normal size, non-tender, without nodularity LYMPH:  no palpable lymphadenopathy in the cervical, axillary or inguinal LUNGS: clear to auscultation and percussion with normal breathing  effort HEART: regular rate & rhythm and no murmurs and no lower extremity edema ABDOMEN:abdomen soft, non-tender and normal bowel sounds MUSCULOSKELETAL:no cyanosis of digits and no clubbing  NEURO: alert & oriented x 3 with fluent speech, no focal motor/sensory deficits EXTREMITIES: No lower extremity edema BREAST: No palpable masses or nodules in either right or left breasts. No palpable axillary supraclavicular or infraclavicular adenopathy no breast tenderness or nipple discharge. (exam performed in the presence of a chaperone)  LABORATORY DATA:  I have reviewed the data as listed   Chemistry      Component Value Date/Time   NA 142 02/10/2017 1010   NA 143 02/12/2016 1316   K 3.6 02/10/2017 1010   K 3.5 02/12/2016 1316   CL 97 02/10/2017 1010   CL 99 10/07/2012 0859   CO2 29 02/10/2017 1010   CO2 30 (H) 02/12/2016 1316   BUN 11 02/10/2017 1010   BUN 14.8 02/12/2016 1316   CREATININE 0.82 02/10/2017 1010   CREATININE 1.0 02/12/2016 1316      Component Value Date/Time   CALCIUM 9.3 02/10/2017 1010   CALCIUM 9.3 02/12/2016 1316   ALKPHOS 99 04/23/2016 0805   ALKPHOS 108 02/12/2016 1316   AST 18 04/23/2016 0805   AST 18 02/12/2016 1316   ALT 16 04/23/2016 0805   ALT 19 02/12/2016 1316   BILITOT 0.9 04/23/2016 0805   BILITOT 0.61 02/12/2016 1316       Lab Results  Component Value Date   WBC 4.6 04/23/2016   HGB 13.2 04/23/2016   HCT 39.6 04/23/2016   MCV 88.1 04/23/2016   PLT 190.0 04/23/2016   NEUTROABS 2.5 04/23/2016    ASSESSMENT & PLAN:  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 Patient is status post lumpectomy with sentinel lymph node biopsy with one of 2 lymph nodes positive for micrometastatic disease.status post radiation therapy currently on antiestrogen therapy with Arimidex started in December 2012  Anastrozole toxicities: 1. Bone  density January 2015: Osteopenia T score -1.6 in the arm, hip T score -0.8: We will order bone density to be done at the breast center  We discussed the issue regarding duration of therapy. I discussed the results of in Ma-17 clinical trial showing 4% absolute decrease in relapse rate for continuation of aromatase inhibitor therapy to 10 years. We discontinued her antiestrogen treatment.  Breast Cancer Surveillance: 1. Breast exam 02/11/2017: Normal 2. Mammogram 06/19/2016 No abnormalities. Postsurgical changes. Breast Density Category B. I recommended that she get 3-D mammograms for surveillance. Discussed the differences between different breast density categories.  Return to clinic in 1 year for follow-up with survivorship clinic.   I spent 25 minutes talking to the patient of which more than half was spent in counseling and coordination of care.  No orders of the defined types were placed in this encounter.  The patient has a good understanding of the overall plan. she agrees with it. she will call with any problems that  may develop before the next visit here.   Rulon Eisenmenger, MD 02/11/17

## 2017-02-24 ENCOUNTER — Other Ambulatory Visit: Payer: PPO | Admitting: *Deleted

## 2017-02-24 DIAGNOSIS — E782 Mixed hyperlipidemia: Secondary | ICD-10-CM | POA: Diagnosis not present

## 2017-02-24 DIAGNOSIS — I493 Ventricular premature depolarization: Secondary | ICD-10-CM

## 2017-02-24 DIAGNOSIS — I10 Essential (primary) hypertension: Secondary | ICD-10-CM | POA: Diagnosis not present

## 2017-02-24 LAB — BASIC METABOLIC PANEL WITH GFR
BUN/Creatinine Ratio: 15 (ref 12–28)
BUN: 14 mg/dL (ref 8–27)
CO2: 25 mmol/L (ref 18–29)
Calcium: 9.3 mg/dL (ref 8.7–10.3)
Chloride: 100 mmol/L (ref 96–106)
Creatinine, Ser: 0.94 mg/dL (ref 0.57–1.00)
GFR calc Af Amer: 69 mL/min/{1.73_m2}
GFR calc non Af Amer: 60 mL/min/{1.73_m2}
Glucose: 99 mg/dL (ref 65–99)
Potassium: 4 mmol/L (ref 3.5–5.2)
Sodium: 142 mmol/L (ref 134–144)

## 2017-04-29 ENCOUNTER — Other Ambulatory Visit (INDEPENDENT_AMBULATORY_CARE_PROVIDER_SITE_OTHER): Payer: PPO

## 2017-04-29 DIAGNOSIS — I10 Essential (primary) hypertension: Secondary | ICD-10-CM | POA: Diagnosis not present

## 2017-04-29 DIAGNOSIS — E785 Hyperlipidemia, unspecified: Secondary | ICD-10-CM

## 2017-04-29 LAB — CBC WITH DIFFERENTIAL/PLATELET
Basophils Absolute: 0 10*3/uL (ref 0.0–0.1)
Basophils Relative: 0.4 % (ref 0.0–3.0)
Eosinophils Absolute: 0.1 10*3/uL (ref 0.0–0.7)
Eosinophils Relative: 2 % (ref 0.0–5.0)
HCT: 40.3 % (ref 36.0–46.0)
Hemoglobin: 13.3 g/dL (ref 12.0–15.0)
Lymphocytes Relative: 30.7 % (ref 12.0–46.0)
Lymphs Abs: 1.4 10*3/uL (ref 0.7–4.0)
MCHC: 33.1 g/dL (ref 30.0–36.0)
MCV: 90.2 fl (ref 78.0–100.0)
Monocytes Absolute: 0.5 10*3/uL (ref 0.1–1.0)
Monocytes Relative: 10.1 % (ref 3.0–12.0)
Neutro Abs: 2.6 10*3/uL (ref 1.4–7.7)
Neutrophils Relative %: 56.8 % (ref 43.0–77.0)
Platelets: 187 10*3/uL (ref 150.0–400.0)
RBC: 4.47 Mil/uL (ref 3.87–5.11)
RDW: 14.1 % (ref 11.5–15.5)
WBC: 4.5 10*3/uL (ref 4.0–10.5)

## 2017-04-29 LAB — BASIC METABOLIC PANEL WITH GFR
BUN: 13 mg/dL (ref 6–23)
CO2: 31 meq/L (ref 19–32)
Calcium: 9.1 mg/dL (ref 8.4–10.5)
Chloride: 106 meq/L (ref 96–112)
Creatinine, Ser: 0.92 mg/dL (ref 0.40–1.20)
GFR: 63.38 mL/min
Glucose, Bld: 96 mg/dL (ref 70–99)
Potassium: 4 meq/L (ref 3.5–5.1)
Sodium: 143 meq/L (ref 135–145)

## 2017-04-29 LAB — HEPATIC FUNCTION PANEL
ALT: 12 U/L (ref 0–35)
AST: 14 U/L (ref 0–37)
Albumin: 3.8 g/dL (ref 3.5–5.2)
Alkaline Phosphatase: 82 U/L (ref 39–117)
Bilirubin, Direct: 0.1 mg/dL (ref 0.0–0.3)
Total Bilirubin: 0.7 mg/dL (ref 0.2–1.2)
Total Protein: 6.4 g/dL (ref 6.0–8.3)

## 2017-04-29 LAB — LIPID PANEL
Cholesterol: 125 mg/dL (ref 0–200)
HDL: 36.9 mg/dL — ABNORMAL LOW
LDL Cholesterol: 64 mg/dL (ref 0–99)
NonHDL: 88.26
Total CHOL/HDL Ratio: 3
Triglycerides: 121 mg/dL (ref 0.0–149.0)
VLDL: 24.2 mg/dL (ref 0.0–40.0)

## 2017-04-29 LAB — TSH: TSH: 4.49 u[IU]/mL (ref 0.35–4.50)

## 2017-05-06 ENCOUNTER — Ambulatory Visit (INDEPENDENT_AMBULATORY_CARE_PROVIDER_SITE_OTHER): Payer: PPO | Admitting: Internal Medicine

## 2017-05-06 ENCOUNTER — Encounter: Payer: Self-pay | Admitting: Internal Medicine

## 2017-05-06 VITALS — BP 110/86 | HR 60 | Temp 98.1°F | Ht 65.0 in | Wt 190.0 lb

## 2017-05-06 DIAGNOSIS — Z0000 Encounter for general adult medical examination without abnormal findings: Secondary | ICD-10-CM

## 2017-05-06 DIAGNOSIS — M159 Polyosteoarthritis, unspecified: Secondary | ICD-10-CM

## 2017-05-06 DIAGNOSIS — M150 Primary generalized (osteo)arthritis: Secondary | ICD-10-CM | POA: Diagnosis not present

## 2017-05-06 DIAGNOSIS — R300 Dysuria: Secondary | ICD-10-CM

## 2017-05-06 DIAGNOSIS — Z860100 Personal history of colon polyps, unspecified: Secondary | ICD-10-CM

## 2017-05-06 DIAGNOSIS — E785 Hyperlipidemia, unspecified: Secondary | ICD-10-CM | POA: Diagnosis not present

## 2017-05-06 DIAGNOSIS — I10 Essential (primary) hypertension: Secondary | ICD-10-CM | POA: Diagnosis not present

## 2017-05-06 DIAGNOSIS — M8949 Other hypertrophic osteoarthropathy, multiple sites: Secondary | ICD-10-CM

## 2017-05-06 LAB — POC URINALSYSI DIPSTICK (AUTOMATED)
Bilirubin, UA: NEGATIVE
Blood, UA: NEGATIVE
Glucose, UA: NEGATIVE
Ketones, UA: NEGATIVE
Leukocytes, UA: NEGATIVE
Nitrite, UA: NEGATIVE
Protein, UA: NEGATIVE
Spec Grav, UA: 1.02
Urobilinogen, UA: 0.2 U/dL
pH, UA: 6

## 2017-05-06 NOTE — Patient Instructions (Signed)
Limit your sodium (Salt) intake    It is important that you exercise regularly, at least 20 minutes 3 to 4 times per week.  If you develop chest pain or shortness of breath seek  medical attention.  You need to lose weight.  Consider a lower calorie diet and regular exercise.  Return in 6 months for follow-up  Schedule your mammogram.

## 2017-05-06 NOTE — Progress Notes (Signed)
Subjective:    Patient ID: Alexandra Calderon, female    DOB: 1941-09-20, 76 y.o.   MRN: [id number]  HPI 76 year old patient who is seen today for a preventive health examination and subsequent Medicare wellness visit She continues to do well.  She has essential hypertension.  She has been evaluated by cardiology recently due to symptomatic palpitations which have been chronic.  Doing well today.  She is followed by oncology with a history of right breast cancer.  She is scheduled for follow-up mammogram in 2 months. Colonoscopy 2016.  She does have a history of colonic polyps. No cardiopulmonary complaints  Past Medical History:  Diagnosis Date  . Allergy   . Arthritis   . Breast cancer ([place])    right  . Colon adenomas 3267,1245  . Constipation   . Diverticulosis   . Esophagitis   . GERD (gastroesophageal reflux disease)   . Hearing loss   . Hyperlipidemia   . Hypertension   . Irritable bowel syndrome 08/27/2012  . Kidney stones   . Osteopenia   . Palpitations   . Pneumonia   . Polyp, stomach   . PVC (premature ventricular contraction)   . UTI (lower urinary tract infection)      Social History   Social History  . Marital status: Married    Spouse name: N/A  . Number of children: 2  . Years of education: N/A   Occupational History  . retired    Social History Main Topics  . Smoking status: Never Smoker  . Smokeless tobacco: Never Used  . Alcohol use No  . Drug use: No  . Sexual activity: Yes   Other Topics Concern  . Not on file   Social History Narrative   Spouse:  [name]   Children:   [name]- age 69- [place]- age 48- [name]    Past Surgical History:  Procedure Laterality Date  . BREAST LUMPECTOMY  07/01/11   w/sentinal node bx - right side  . COLONOSCOPY W/ BIOPSIES    . DILATION AND CURETTAGE OF UTERUS    . ESOPHAGOGASTRODUODENOSCOPY    . kidney stones    . shoulder arthroscopy rt 2009    . SHOULDER SURGERY       Family History  Problem Relation Age of Onset  . Lymphoma Father   . Cancer Father   . Hodgkin's lymphoma Father   . Colon cancer Mother   . Diabetes Maternal Aunt   . Diabetes Paternal Grandmother   . Colon polyps Neg Hx   . Rectal cancer Neg Hx   . Stomach cancer Neg Hx     Allergies  Allergen Reactions  . Levofloxacin     REACTION: rash  . Sulfamethoxazole-Trimethoprim Rash and Other (See Comments)    Chills, fever  . Sulfonamide Derivatives Rash and Other (See Comments)    Chills, fever    Current Outpatient Prescriptions on File Prior to Visit  Medication Sig Dispense Refill  . ALPRAZolam (XANAX) 0.25 MG tablet Take 1 tablet (0.25 mg total) by mouth at bedtime as needed for anxiety or sleep. 90 tablet 1  . aspirin 81 MG tablet Take 81 mg by mouth at bedtime.    Marland Kitchen atenolol (TENORMIN) 50 MG tablet TAKE 2 TABLETS BY MOUTH EVERY DAY 180 tablet 1  . cholecalciferol (VITAMIN D) 1000 units tablet Take 2,000 Units by mouth daily.    . Coenzyme Q10 (CO Q 10 PO) Take 100 mg  by mouth daily.    . famotidine (PEPCID) 20 MG tablet Take 1 tablet (20 mg total) by mouth 2 (two) times daily. 180 tablet 1  . losartan (COZAAR) 25 MG tablet Take 1 tablet (25 mg total) by mouth daily. 30 tablet 11  . OVER THE COUNTER MEDICATION Take 1 tablet by mouth daily. [facility] stool softener daily    . simvastatin (ZOCOR) 20 MG tablet TAKE 1 TABLET BY MOUTH EVERY DAY 90 tablet 1  . spironolactone (ALDACTONE) 25 MG tablet Take 1 tablet (25 mg total) by mouth daily. 30 tablet 11   No current facility-administered medications on file prior to visit.     BP 110/86 (BP Location: Left Arm, Patient Position: Sitting, Cuff Size: Normal)   Pulse 60   Temp 98.1 F (36.7 C) (Oral)   Ht 5\' 5"  (1.651 m)   Wt 190 lb (86.2 kg)   SpO2 97%   BMI 31.62 kg/m   Medicare wellness visit  1. Risk factors, based on past  M,S,F history.  Patient has a history of palpitations.  Cardio vascular risk factors  include hypertension and dyslipidemia  2.  Physical activities:sedentary but no exercise restrictions.  Walks occasionally but no regimented exercise plan  3.  Depression/mood:history of some mild anxiety but no major depression or mood disorder  4.  Hearing:no hearing deficits.  Complains of mild chronic tinnitus  5.  ADL's:independent  6.  Fall risk:low  7.  Home safety:no problems identified  8.  Height weight, and visual acuity;height and weight stable no change in visual acuity is followed by Dr. Gershon Crane annually  9.  Counseling:modest weight loss and more vigorous activities.  Encouraged  10. Lab orders based on risk factors:laboratory profile including lipid panel reviewed  11. Referral :follow-up cardiology and oncology  12. Care plan:continue efforts at aggressive risk factor modification.  Blood pressure and lipids well controlled  13. Cognitive assessment: alert and oriented with normal affect.  No cognitive dysfunction  14. Screening: Patient provided with a written and personalized 5-10 year screening schedule in the AVS.  We'll continue colonoscopies at five-year intervals  15. Provider List Update: primary care ophthalmology, cardiology GI and oncology     Review of Systems  Constitutional: Negative.   HENT: Positive for tinnitus. Negative for congestion, dental problem, hearing loss, rhinorrhea, sinus pressure and sore throat.   Eyes: Negative for pain, discharge and visual disturbance.  Respiratory: Negative for cough and shortness of breath.   Cardiovascular: Positive for palpitations. Negative for chest pain and leg swelling.  Gastrointestinal: Negative for abdominal distention, abdominal pain, blood in stool, constipation, diarrhea, nausea and vomiting.  Genitourinary: Negative for difficulty urinating, dysuria, flank pain, frequency, hematuria, pelvic pain, urgency, vaginal bleeding, vaginal discharge and vaginal pain.  Musculoskeletal: Negative for  arthralgias, gait problem and joint swelling.  Skin: Negative for rash.  Neurological: Negative for dizziness, syncope, speech difficulty, weakness, numbness and headaches.  Hematological: Negative for adenopathy.  Psychiatric/Behavioral: Negative for agitation, behavioral problems and dysphoric mood. The patient is not nervous/anxious.        Objective:   Physical Exam  Constitutional: She is oriented to person, place, and time. She appears well-developed and well-nourished.  Weight 190 Blood pressure 124/78  HENT:  Head: Normocephalic and atraumatic.  Right Ear: External ear normal.  Left Ear: External ear normal.  Mouth/Throat: Oropharynx is clear and moist.  Eyes: Conjunctivae and EOM are normal.  Neck: Normal range of motion. Neck supple. No JVD present. No  thyromegaly present.  Cardiovascular: Normal rate, regular rhythm, normal heart sounds and intact distal pulses.   No murmur heard. Dorsalis pedis pulses plus 3.  Posterior tibial pulses not easily palpable  Pulmonary/Chest: Effort normal and breath sounds normal. She has no wheezes. She has no rales.  Abdominal: Soft. Bowel sounds are normal. She exhibits no distension and no mass. There is no tenderness. There is no rebound and no guarding.  Musculoskeletal: Normal range of motion. She exhibits no edema or tenderness.  Neurological: She is alert and oriented to person, place, and time. She has normal reflexes. No cranial nerve deficit. She exhibits normal muscle tone. Coordination normal.  Skin: Skin is warm and dry. No rash noted.  Psychiatric: She has a normal mood and affect. Her behavior is normal.          Assessment & Plan:   Preventive health examination Subsequent Medicare wellness visit Essential hypertension, stable Chronic palpitations.  Continue beta blocker therapy History right breast cancer.  Follow-up oncology.  Mammogram scheduled in September History colonic polyps.  Continue colonoscopies at  five-year intervals Dyslipidemia.  Continue statin therapy.  LDL cholesterol at goal  Follow-up 6 months or as needed  Nyoka Cowden

## 2017-05-11 ENCOUNTER — Encounter: Payer: Self-pay | Admitting: Internal Medicine

## 2017-05-19 ENCOUNTER — Other Ambulatory Visit: Payer: Self-pay | Admitting: Internal Medicine

## 2017-05-19 ENCOUNTER — Other Ambulatory Visit: Payer: Self-pay | Admitting: Hematology and Oncology

## 2017-05-19 DIAGNOSIS — Z853 Personal history of malignant neoplasm of breast: Secondary | ICD-10-CM

## 2017-06-01 ENCOUNTER — Ambulatory Visit (INDEPENDENT_AMBULATORY_CARE_PROVIDER_SITE_OTHER): Payer: PPO | Admitting: Cardiovascular Disease

## 2017-06-01 ENCOUNTER — Encounter: Payer: Self-pay | Admitting: Cardiovascular Disease

## 2017-06-01 VITALS — BP 132/64 | HR 56 | Ht 66.0 in | Wt 192.8 lb

## 2017-06-01 DIAGNOSIS — I10 Essential (primary) hypertension: Secondary | ICD-10-CM | POA: Diagnosis not present

## 2017-06-01 MED ORDER — SPIRONOLACTONE 25 MG PO TABS: 25.0000 mg | ORAL_TABLET | Freq: Every day | ORAL | 3 refills | Status: DC

## 2017-06-01 NOTE — Patient Instructions (Signed)
Medication Instructions:  Your physician recommends that you continue on your current medications as directed. Please refer to the Current Medication list given to you today.   Labwork: None Ordered   Testing/Procedures: None Ordered   Follow-Up: Your physician wants you to follow-up in: 1 year with Dr. Nahser.  You will receive a reminder letter in the mail two months in advance. If you don't receive a letter, please call our office to schedule the follow-up appointment.   If you need a refill on your cardiac medications before your next appointment, please call your pharmacy.   Thank you for choosing [facility]! Elyas Villamor, RN [phone]    

## 2017-06-01 NOTE — Progress Notes (Signed)
Cardiology Office Note   Date:  06/01/2017   ID:  Alexandra Calderon, Alexandra Calderon [birth year], MRN [id number]  PCP:  Alexandra Lor, MD  Cardiologist:   Alexandra Moores, MD   Chief Complaint  Patient presents with  . Follow-up    essential hypertension   Problem List 1. Essential HTN 2. Palpitations - PVCs  3. Hyperlipidemia  4. Irritable bowel syndrome     History of Present Illness:  Alexandra Calderon is a 76 y.o. female who is being seen today for the evaluation of palpitations  at the request of Alexandra Lor, MD.  Seen with her husband   Alexandra Calderon  Has had palpitations all of her life.    Over the years, they have been stable for years.  Dr. Raliegh Calderon increased her atenolol which seemed to have helped.   Has rare , isolated "thumps"  And then normal HR for a while These episodes may go on for several days Seem to be worse after eating , in the evening .  Do not limit her activity .  Not associated with weakness or dizziness, no dyspnea.    Does not get any regular exercise - tries to walk some   Aug. 20, 2018  Alexandra Calderon is here for follow up of her HTN and PVCs  Heart is now "quiet." Exercising some ,  No CP or dyspnea.   Past Medical History:  Diagnosis Date  . Allergy   . Arthritis   . Breast cancer ([place])    right  . Colon adenomas 5053,9767  . Constipation   . Diverticulosis   . Esophagitis   . GERD (gastroesophageal reflux disease)   . Hearing loss   . Hyperlipidemia   . Hypertension   . Irritable bowel syndrome 08/27/2012  . Kidney stones   . Osteopenia   . Palpitations   . Pneumonia   . Polyp, stomach   . PVC (premature ventricular contraction)   . UTI (lower urinary tract infection)     Past Surgical History:  Procedure Laterality Date  . BREAST LUMPECTOMY  07/01/11   w/sentinal node bx - right side  . COLONOSCOPY W/ BIOPSIES    . DILATION AND CURETTAGE OF UTERUS    . ESOPHAGOGASTRODUODENOSCOPY    . kidney stones    . shoulder arthroscopy rt 2009     . SHOULDER SURGERY       Current Outpatient Prescriptions  Medication Sig Dispense Refill  . ALPRAZolam (XANAX) 0.25 MG tablet Take 1 tablet (0.25 mg total) by mouth at bedtime as needed for anxiety or sleep. 90 tablet 1  . aspirin 81 MG tablet Take 81 mg by mouth at bedtime.    Marland Kitchen atenolol (TENORMIN) 50 MG tablet TAKE 2 TABLETS BY MOUTH EVERY DAY 180 tablet 1  . cholecalciferol (VITAMIN D) 1000 units tablet Take 2,000 Units by mouth daily.    . Coenzyme Q10 (CO Q 10 PO) Take 100 mg by mouth daily.    . famotidine (PEPCID) 20 MG tablet Take 1 tablet (20 mg total) by mouth 2 (two) times daily. 180 tablet 1  . losartan (COZAAR) 25 MG tablet Take 25 mg by mouth daily.    Marland Kitchen OVER THE COUNTER MEDICATION Take 1 tablet by mouth daily. [facility] stool softener daily    . simvastatin (ZOCOR) 20 MG tablet TAKE 1 TABLET BY MOUTH EVERY DAY 90 tablet 1  . spironolactone (ALDACTONE) 25 MG tablet Take 1 tablet (25 mg total) by mouth daily.  30 tablet 11   No current facility-administered medications for this visit.     PAD Screen 02/10/2017  Previous PAD dx? No  Previous surgical procedure? No  Pain with walking? No  Feet/toe relief with dangling? No  Painful, non-healing ulcers? No  Extremities discolored? No      Allergies:   Gatifloxacin; Levofloxacin; Nitrofurantoin macrocrystal; Sulfamethoxazole-trimethoprim; and Sulfonamide derivatives    Social History:  The patient  reports that she has never smoked. She has never used smokeless tobacco. She reports that she does not drink alcohol or use drugs.   Family History:  The patient's family history includes Cancer in her father; Colon cancer in her mother; Diabetes in her maternal aunt and paternal grandmother; Hodgkin's lymphoma in her father; Lymphoma in her father.    ROS:  Please see the history of present illness.    Review of Systems: Constitutional:  denies fever, chills, diaphoresis, appetite change and fatigue.  HEENT: denies  photophobia, eye pain, redness, hearing loss, ear pain, congestion, sore throat, rhinorrhea, sneezing, neck pain, neck stiffness and tinnitus.  Respiratory: denies SOB, DOE, cough, chest tightness, and wheezing.  Cardiovascular: admits to   palpitations  .  Gastrointestinal: denies nausea, vomiting, abdominal pain, diarrhea, constipation, blood in stool.  Genitourinary: denies dysuria, urgency, frequency, hematuria, flank pain and difficulty urinating.  Musculoskeletal: denies  myalgias, back pain, joint swelling, arthralgias and gait problem.   Skin: denies pallor, rash and wound.  Neurological: denies dizziness, seizures, syncope, weakness, light-headedness, numbness and headaches.   Hematological: denies adenopathy, easy bruising, personal or family bleeding history.  Psychiatric/ Behavioral: denies suicidal ideation, mood changes, confusion, nervousness, sleep disturbance and agitation.      All other systems are reviewed and negative.   PHYSICAL EXAM: VS:  BP 132/64   Pulse (!) 56   Ht 5\' 6"  (1.676 m)   Wt 192 lb 12.8 oz (87.5 kg)   BMI 31.12 kg/m  , BMI Body mass index is 31.12 kg/m. GEN: Well nourished, well developed, in no acute distress  HEENT: normal  Neck: no JVD, carotid bruits, or masses Cardiac: RRR; no murmurs, rubs, or gallops,no edema  Respiratory:  clear to auscultation bilaterally, normal work of breathing GI: soft, nontender, nondistended, + BS MS: no deformity or atrophy  Skin: warm and dry, no rash Neuro:  Strength and sensation are intact Psych: normal   EKG:  EKG is no ordered today. The ekg ordered today demonstrates  [employer] RBBB , mild voltage for LVH     Recent Labs: 04/29/2017: ALT 12; BUN 13; Creatinine, Ser 0.92; Hemoglobin 13.3; Platelets 187.0; Potassium 4.0; Sodium 143; TSH 4.49    Lipid Panel    Component Value Date/Time   CHOL 125 04/29/2017 0800   TRIG 121.0 04/29/2017 0800   HDL 36.90 (L) 04/29/2017 0800   CHOLHDL 3 04/29/2017  0800   VLDL 24.2 04/29/2017 0800   LDLCALC 64 04/29/2017 0800      Wt Readings from Last 3 Encounters:  06/01/17 192 lb 12.8 oz (87.5 kg)  05/06/17 190 lb (86.2 kg)  02/11/17 193 lb 11.2 oz (87.9 kg)      Other studies Reviewed: Additional studies/ records that were reviewed today include: . Review of the above records demonstrates:    ASSESSMENT AND PLAN:  1.  Palpitations - Clinically her palpitations are consistent with premature ventricular contractions.  PVCs have improved since she started  Potassium supplementation  2. Hypertension: Continue meds  Will see her in 1  year  Current medicines are reviewed at length with the patient today.  The patient does not have concerns regarding medicines.  Labs/ tests ordered today include:  No orders of the defined types were placed in this encounter.    Disposition:   FU with me in 1 year       Alexandra Moores, MD  06/01/2017 2:27 PM    [address], [place], [place]  [address] Phone: [phone]; Fax: [phone]

## 2017-06-09 ENCOUNTER — Ambulatory Visit: Payer: PPO

## 2017-06-16 ENCOUNTER — Ambulatory Visit (INDEPENDENT_AMBULATORY_CARE_PROVIDER_SITE_OTHER): Payer: PPO | Admitting: *Deleted

## 2017-06-16 DIAGNOSIS — Z23 Encounter for immunization: Secondary | ICD-10-CM

## 2017-06-22 ENCOUNTER — Ambulatory Visit
Admission: RE | Admit: 2017-06-22 | Discharge: 2017-06-22 | Disposition: A | Discharge status: Home / Self Care | Payer: PPO | Source: Ambulatory Visit | Attending: Internal Medicine | Admitting: Internal Medicine

## 2017-06-22 DIAGNOSIS — R928 Other abnormal and inconclusive findings on diagnostic imaging of breast: Secondary | ICD-10-CM | POA: Diagnosis not present

## 2017-06-22 DIAGNOSIS — Z853 Personal history of malignant neoplasm of breast: Secondary | ICD-10-CM

## 2017-06-22 HISTORY — DX: Personal history of irradiation: Z92.3

## 2017-06-23 ENCOUNTER — Other Ambulatory Visit: Payer: Self-pay | Admitting: Internal Medicine

## 2017-07-02 ENCOUNTER — Encounter: Payer: Self-pay | Admitting: Internal Medicine

## 2017-07-14 ENCOUNTER — Other Ambulatory Visit: Payer: Self-pay | Admitting: Internal Medicine

## 2017-07-20 DIAGNOSIS — H52203 Unspecified astigmatism, bilateral: Secondary | ICD-10-CM | POA: Diagnosis not present

## 2017-07-20 DIAGNOSIS — H524 Presbyopia: Secondary | ICD-10-CM | POA: Diagnosis not present

## 2017-08-28 ENCOUNTER — Ambulatory Visit: Payer: PPO | Admitting: Family Medicine

## 2017-08-28 ENCOUNTER — Encounter: Payer: Self-pay | Admitting: Family Medicine

## 2017-08-28 VITALS — BP 116/60 | HR 65 | Temp 98.4°F | Wt 195.2 lb

## 2017-08-28 DIAGNOSIS — J018 Other acute sinusitis: Secondary | ICD-10-CM

## 2017-08-28 MED ORDER — HYDROCODONE-HOMATROPINE 5-1.5 MG/5ML PO SYRP: 5.0000 mL | ORAL_SOLUTION | ORAL | 0 refills | Status: DC | PRN

## 2017-08-28 MED ORDER — AMOXICILLIN-POT CLAVULANATE 875-125 MG PO TABS: 1.0000 | ORAL_TABLET | Freq: Two times a day (BID) | ORAL | 0 refills | Status: DC

## 2017-08-28 NOTE — Progress Notes (Signed)
   Subjective:    Patient ID: Alexandra Calderon, female    DOB: 01-31-1941, 76 y.o.   MRN: [id number]  HPI Here for one week of sinus pressure, PND, and coughing up yellow sputum. No fever.   Review of Systems  Constitutional: Negative.   HENT: Positive for congestion, postnasal drip, sinus pressure and sinus pain. Negative for ear pain and sore throat.   Eyes: Negative.   Respiratory: Positive for cough.        Objective:   Physical Exam  Constitutional: She appears well-developed and well-nourished.  HENT:  Right Ear: External ear normal.  Left Ear: External ear normal.  Nose: Nose normal.  Mouth/Throat: Oropharynx is clear and moist.  Eyes: Conjunctivae are normal.  Neck: Neck supple. No thyromegaly present.  Pulmonary/Chest: Effort normal and breath sounds normal. No respiratory distress. She has no wheezes. She has no rales.  Lymphadenopathy:    She has no cervical adenopathy.          Assessment & Plan:  Sinusitis, treat with Augmentin. Add Mucinex prn. Alysia Penna, MD

## 2017-09-10 ENCOUNTER — Telehealth: Payer: Self-pay

## 2017-09-10 NOTE — Telephone Encounter (Signed)
Pt requesting a new Rx pt stated that she cant swallow the Augmentin tab pt would like to switch to liquid. Sent to PCP for approval.

## 2017-09-11 NOTE — Telephone Encounter (Signed)
Sent to wrong physician Dr. Sarajane Jews saw patient and prescribed

## 2017-09-11 NOTE — Telephone Encounter (Signed)
Sent to Dr. Sarajane Jews last seen by Dr. Sarajane Jews for 08/28/2017.

## 2017-09-11 NOTE — Telephone Encounter (Signed)
Please call in Augmentin suspension 400/57 per 5 ml, to take 10 ml bid for 10 days

## 2017-09-14 MED ORDER — AMOXICILLIN-POT CLAVULANATE 400-57 MG/5ML PO SUSR: 10.0000 mL | Freq: Two times a day (BID) | ORAL | 0 refills | Status: DC

## 2017-09-14 NOTE — Telephone Encounter (Signed)
Rx was sent for augmentin suspension 400/57 per 5 ml, to take 10 ml bid for 10 days

## 2017-09-16 DIAGNOSIS — Z853 Personal history of malignant neoplasm of breast: Secondary | ICD-10-CM | POA: Insufficient documentation

## 2017-09-16 DIAGNOSIS — D251 Intramural leiomyoma of uterus: Secondary | ICD-10-CM | POA: Diagnosis not present

## 2017-09-16 DIAGNOSIS — N393 Stress incontinence (female) (male): Secondary | ICD-10-CM | POA: Diagnosis not present

## 2017-09-16 DIAGNOSIS — N959 Unspecified menopausal and perimenopausal disorder: Secondary | ICD-10-CM | POA: Diagnosis not present

## 2017-09-16 DIAGNOSIS — N950 Postmenopausal bleeding: Secondary | ICD-10-CM | POA: Diagnosis not present

## 2017-09-16 DIAGNOSIS — C50919 Malignant neoplasm of unspecified site of unspecified female breast: Secondary | ICD-10-CM | POA: Insufficient documentation

## 2017-09-18 ENCOUNTER — Encounter: Payer: Self-pay | Admitting: Family Medicine

## 2017-09-18 ENCOUNTER — Ambulatory Visit (INDEPENDENT_AMBULATORY_CARE_PROVIDER_SITE_OTHER): Payer: PPO | Admitting: Family Medicine

## 2017-09-18 VITALS — BP 100/62 | HR 60 | Temp 97.8°F | Resp 16 | Ht 67.0 in | Wt 193.0 lb

## 2017-09-18 DIAGNOSIS — K219 Gastro-esophageal reflux disease without esophagitis: Secondary | ICD-10-CM | POA: Diagnosis not present

## 2017-09-18 DIAGNOSIS — Z7689 Persons encountering health services in other specified circumstances: Secondary | ICD-10-CM | POA: Diagnosis not present

## 2017-09-18 DIAGNOSIS — K589 Irritable bowel syndrome without diarrhea: Secondary | ICD-10-CM | POA: Diagnosis not present

## 2017-09-18 DIAGNOSIS — C50211 Malignant neoplasm of upper-inner quadrant of right female breast: Secondary | ICD-10-CM | POA: Diagnosis not present

## 2017-09-18 DIAGNOSIS — I10 Essential (primary) hypertension: Secondary | ICD-10-CM

## 2017-09-18 DIAGNOSIS — I493 Ventricular premature depolarization: Secondary | ICD-10-CM

## 2017-09-18 DIAGNOSIS — R002 Palpitations: Secondary | ICD-10-CM

## 2017-09-18 DIAGNOSIS — E785 Hyperlipidemia, unspecified: Secondary | ICD-10-CM | POA: Diagnosis not present

## 2017-09-18 MED ORDER — OMEPRAZOLE 40 MG PO CPDR: 40.0000 mg | DELAYED_RELEASE_CAPSULE | Freq: Every day | ORAL | 3 refills | Status: DC

## 2017-09-18 NOTE — Progress Notes (Signed)
Subjective:    Patient ID: Alexandra Calderon, female    DOB: [birth year], 76 y.o.   MRN: [id number]  HPI Patient is here today to establish care.  Patient is a very pleasant 76 year old white female who has a past medical history significant for breast cancer.  This was treated with lumpectomy and postoperative hormone therapy according to the patient although she did not take tamoxifen.  She also has a history of hypertension, GERD which is refractory to Pepcid, hyperlipidemia/dyslipidemia, irritable bowel syndrome predominantly with constipation, osteopenia, and occasional frequent PVCs.  Her mammogram is up-to-date and is due next year.  She gets this at the [facility] center.  Her colonoscopy is due in 5 years according to the patient her immunizations including Pneumovax 23, Prevnar 13, and the shingles vaccine along with a flu shot are up-to-date Immunization History  Administered Date(s) Administered  . DTP 05/24/2007  . Influenza Split 06/29/2012  . Influenza Whole 10/14/1999, 07/29/2007, 07/28/2008, 07/05/2009, 07/26/2010  . Influenza, High Dose Seasonal PF 07/25/2014, 08/02/2015, 06/25/2016, 06/16/2017  . Influenza,inj,Quad PF,6+ Mos 07/05/2013  . Pneumococcal Conjugate-13 08/15/2013  . Pneumococcal Polysaccharide-23 10/13/2002, 04/19/2015  . Td 10/13/2006, 05/24/2007  . Zoster 08/05/2016    Past Medical History:  Diagnosis Date  . Allergy   . Arthritis   . Breast cancer ([place])    right  . Colon adenomas [id number]  . Constipation   . Diverticulosis   . Esophagitis   . GERD (gastroesophageal reflux disease)   . Hearing loss   . Hyperlipidemia   . Hypertension   . Irritable bowel syndrome 08/27/2012  . Kidney stones   . Osteopenia   . Palpitations   . Personal history of radiation therapy   . Pneumonia   . Polyp, stomach   . PVC (premature ventricular contraction)   . UTI (lower urinary tract infection)    Past Surgical History:  Procedure Laterality Date  . BREAST  LUMPECTOMY Right 07/01/11   w/sentinal node bx - right side  . COLONOSCOPY W/ BIOPSIES    . DILATION AND CURETTAGE OF UTERUS    . ESOPHAGOGASTRODUODENOSCOPY    . kidney stones    . shoulder arthroscopy rt 2009    . SHOULDER SURGERY      Current Outpatient Medications on File Prior to Visit  Medication Sig Dispense Refill  . ALPRAZolam (XANAX) 0.25 MG tablet Take 1 tablet (0.25 mg total) by mouth at bedtime as needed for anxiety or sleep. 90 tablet 1  . atenolol (TENORMIN) 50 MG tablet TAKE 2 TABLETS BY MOUTH EVERY DAY 180 tablet 1  . cholecalciferol (VITAMIN D) 1000 units tablet Take 2,000 Units by mouth daily.    . Coenzyme Q10 (CO Q 10 PO) Take 100 mg by mouth daily.    . famotidine (PEPCID) 20 MG tablet TAKE 1 TABLET BY MOUTH TWICE A DAY 180 tablet 1  . losartan (COZAAR) 25 MG tablet Take 25 mg by mouth daily.    Marland Kitchen OVER THE COUNTER MEDICATION Take 1 tablet by mouth daily. [facility] stool softener daily    . simvastatin (ZOCOR) 20 MG tablet TAKE 1 TABLET BY MOUTH EVERY DAY 90 tablet 1  . spironolactone (ALDACTONE) 25 MG tablet Take 1 tablet (25 mg total) by mouth daily. 90 tablet 3   No current facility-administered medications on file prior to visit.    Allergies  Allergen Reactions  . Gatifloxacin     unknown  . Levofloxacin     REACTION: rash  .  Nitrofurantoin Macrocrystal     unknown  . Sulfamethoxazole-Trimethoprim Rash and Other (See Comments)    Chills, fever  . Sulfonamide Derivatives Rash and Other (See Comments)    Chills, fever   Social History   Socioeconomic History  . Marital status: Married    Spouse name: Not on file  . Number of children: 2  . Years of education: Not on file  . Highest education level: Not on file  Social Needs  . Financial resource strain: Not on file  . Food insecurity - worry: Not on file  . Food insecurity - inability: Not on file  . Transportation needs - medical: Not on file  . Transportation needs - non-medical: Not on file    Occupational History  . Occupation: retired  Tobacco Use  . Smoking status: Never Smoker  . Smokeless tobacco: Never Used  Substance and Sexual Activity  . Alcohol use: No  . Drug use: No  . Sexual activity: Yes  Other Topics Concern  . Not on file  Social History Narrative   Spouse:  [name]   Children:   [name]- age 66- [place]- age 66- [place]   Family History  Problem Relation Age of Onset  . Lymphoma Father   . Cancer Father   . Hodgkin's lymphoma Father   . Colon cancer Mother   . Diabetes Maternal Aunt   . Diabetes Paternal Grandmother   . Colon polyps Neg Hx   . Rectal cancer Neg Hx   . Stomach cancer Neg Hx      Review of Systems  All other systems reviewed and are negative.      Objective:   Physical Exam  Constitutional: She is oriented to person, place, and time. She appears well-developed and well-nourished. No distress.  HENT:  Head: Normocephalic and atraumatic.  Right Ear: External ear normal.  Left Ear: External ear normal.  Nose: Nose normal.  Mouth/Throat: Oropharynx is clear and moist.  Eyes: Conjunctivae and EOM are normal. Pupils are equal, round, and reactive to light. Right eye exhibits no discharge. Left eye exhibits no discharge. No scleral icterus.  Neck: Normal range of motion. Neck supple. No JVD present. No tracheal deviation present. No thyromegaly present.  Cardiovascular: Normal rate, regular rhythm, normal heart sounds and intact distal pulses. Exam reveals no gallop and no friction rub.  No murmur heard. Pulmonary/Chest: Effort normal and breath sounds normal. No stridor. No respiratory distress. She has no wheezes. She has no rales. She exhibits no tenderness.  Abdominal: Soft. Bowel sounds are normal. She exhibits no distension and no mass. There is no tenderness. There is no rebound and no guarding.  Musculoskeletal: Normal range of motion. She exhibits no edema, tenderness or deformity.   Lymphadenopathy:    She has no cervical adenopathy.  Neurological: She is alert and oriented to person, place, and time. She has normal reflexes. She displays normal reflexes. No cranial nerve deficit. She exhibits normal muscle tone. Coordination normal.  Skin: Skin is warm. No rash noted. She is not diaphoretic. No erythema. No pallor.  Psychiatric: She has a normal mood and affect. Her behavior is normal. Judgment and thought content normal.  Vitals reviewed.         Assessment & Plan:  Encounter to establish care with new doctor  Essential hypertension  Dyslipidemia  Palpitations  PVC's (premature ventricular contractions)  Irritable bowel syndrome, unspecified type  Gastroesophageal reflux disease, esophagitis presence not specified  Malignant neoplasm of upper-inner quadrant of right female breast, unspecified estrogen receptor status ([place])  Patient's exam is completely normal.  She is having breakthrough heartburn on Pepcid.  I have recommended discontinuation of Pepcid and resumption of omeprazole 40 mg a day.  We discussed about the increased risk of fractures on this but she is having breakthrough symptoms despite daily H2 blocker therapy.  Immunizations are up-to-date.  Mammogram and colonoscopy are up-to-date.  Most recent lab work from July was reviewed with the patient was excellent.  I would repeat fasting lab work every 6 months.  Otherwise follow-up in 6 months as needed with a physical.

## 2017-09-28 ENCOUNTER — Ambulatory Visit: Payer: PPO | Admitting: Family Medicine

## 2017-09-28 ENCOUNTER — Encounter: Payer: Self-pay | Admitting: Family Medicine

## 2017-09-28 VITALS — BP 146/78 | HR 88 | Temp 99.8°F | Resp 16 | Ht 67.0 in | Wt 192.0 lb

## 2017-09-28 DIAGNOSIS — J189 Pneumonia, unspecified organism: Secondary | ICD-10-CM

## 2017-09-28 MED ORDER — AZITHROMYCIN 250 MG PO TABS: ORAL_TABLET | ORAL | 0 refills | Status: DC

## 2017-09-28 NOTE — Progress Notes (Signed)
Subjective:    Patient ID: Alexandra Calderon, female    DOB: 1940/11/03, 76 y.o.   MRN: [id number]  HPI Symptoms began Saturday with a fever to 102.8.  She reports chills, constant nonproductive cough, audible chest congestion, mild pleurisy.  She denies any ear pain.  She denies any sinus pain.  She denies any sore throat.  She denies any rhinorrhea.  She has been taking Hycodan over the weekend with little benefit regarding the cough.  She has been taking Tylenol for fever.  She denies any abdominal pain but she does report some nausea.  She denies any vomiting.  Continues to have low-grade fever even this morning.  Yesterday her fever was as high as 102.0.  On exam, she has right basilar crackles and diminished breath sounds on the right side. Past Medical History:  Diagnosis Date  . Allergy   . Arthritis   . Breast cancer ([place])    right  . Colon adenomas [id number]  . Constipation   . Diverticulosis   . Esophagitis   . GERD (gastroesophageal reflux disease)   . Hearing loss   . Hyperlipidemia   . Hypertension   . Irritable bowel syndrome 08/27/2012  . Kidney stones   . Osteopenia   . Palpitations   . Personal history of radiation therapy   . Pneumonia   . Polyp, stomach   . PVC (premature ventricular contraction)   . UTI (lower urinary tract infection)    Past Surgical History:  Procedure Laterality Date  . BREAST LUMPECTOMY Right 07/01/11   w/sentinal node bx - right side  . COLONOSCOPY W/ BIOPSIES    . DILATION AND CURETTAGE OF UTERUS    . ESOPHAGOGASTRODUODENOSCOPY    . kidney stones    . shoulder arthroscopy rt 2009    . SHOULDER SURGERY     Current Outpatient Medications on File Prior to Visit  Medication Sig Dispense Refill  . ALPRAZolam (XANAX) 0.25 MG tablet Take 1 tablet (0.25 mg total) by mouth at bedtime as needed for anxiety or sleep. 90 tablet 1  . atenolol (TENORMIN) 50 MG tablet TAKE 2 TABLETS BY MOUTH EVERY DAY 180 tablet 1  . cholecalciferol (VITAMIN D)  1000 units tablet Take 2,000 Units by mouth daily.    . Coenzyme Q10 (CO Q 10 PO) Take 100 mg by mouth daily.    . famotidine (PEPCID) 20 MG tablet TAKE 1 TABLET BY MOUTH TWICE A DAY 180 tablet 1  . losartan (COZAAR) 25 MG tablet Take 25 mg by mouth daily.    Marland Kitchen omeprazole (PRILOSEC) 40 MG capsule Take 1 capsule (40 mg total) by mouth daily. 30 capsule 3  . OVER THE COUNTER MEDICATION Take 1 tablet by mouth daily. [facility] stool softener daily    . simvastatin (ZOCOR) 20 MG tablet TAKE 1 TABLET BY MOUTH EVERY DAY 90 tablet 1  . spironolactone (ALDACTONE) 25 MG tablet Take 1 tablet (25 mg total) by mouth daily. 90 tablet 3   No current facility-administered medications on file prior to visit.    Allergies  Allergen Reactions  . Gatifloxacin     unknown  . Levofloxacin     REACTION: rash  . Nitrofurantoin Macrocrystal     unknown  . Sulfamethoxazole-Trimethoprim Rash and Other (See Comments)    Chills, fever  . Sulfonamide Derivatives Rash and Other (See Comments)    Chills, fever   Social History   Socioeconomic History  . Marital status: Married  Spouse name: Not on file  . Number of children: 2  . Years of education: Not on file  . Highest education level: Not on file  Social Needs  . Financial resource strain: Not on file  . Food insecurity - worry: Not on file  . Food insecurity - inability: Not on file  . Transportation needs - medical: Not on file  . Transportation needs - non-medical: Not on file  Occupational History  . Occupation: retired  Tobacco Use  . Smoking status: Never Smoker  . Smokeless tobacco: Never Used  Substance and Sexual Activity  . Alcohol use: No  . Drug use: No  . Sexual activity: Yes  Other Topics Concern  . Not on file  Social History Narrative   Spouse:  [name]   Children:   [name]- age 32- [place]- age 43- [place]      Review of Systems  All other systems reviewed and are negative.        Objective:   Physical Exam  Constitutional: She appears well-developed and well-nourished.  HENT:  Right Ear: External ear normal.  Left Ear: External ear normal.  Nose: Nose normal.  Mouth/Throat: Oropharynx is clear and moist.  Eyes: Conjunctivae are normal.  Neck: Neck supple.  Cardiovascular: Normal rate, regular rhythm and normal heart sounds.  No murmur heard. Pulmonary/Chest: Effort normal. She has rales in the right lower field.      Lymphadenopathy:    She has no cervical adenopathy.  Vitals reviewed.         Assessment & Plan:  Walking pneumonia  Based on her pulmonary exam, her high fever, I am concerned that she is developing community-acquired pneumonia/walking pneumonia.  Due to her allergy profile, I will start the patient on a Z-Pak and reassess the patient in 48 hours.  She denies any myalgias.  She denies any rhinorrhea.  Therefore I do not believe this is the flu.

## 2017-09-29 ENCOUNTER — Telehealth: Payer: Self-pay | Admitting: Family Medicine

## 2017-09-29 MED ORDER — AMOXICILLIN 875 MG PO TABS: 875.0000 mg | ORAL_TABLET | Freq: Two times a day (BID) | ORAL | 0 refills | Status: DC

## 2017-09-29 NOTE — Telephone Encounter (Signed)
Stop z pack and try amoxicillin 875 mg pobid for 10 days.

## 2017-09-29 NOTE — Telephone Encounter (Signed)
Patient called and complains of breaking out in a rash on face, chest and gluteal after starting azithromycin also states her vision is blurry. Patient denies having any swelling in face. Patient denies having trouble swallowing, Patient denies having trouble breathing.  Pls advise

## 2017-09-29 NOTE — Telephone Encounter (Signed)
Patient advised to stop the z pak and start the amoxicillin 875 mg. Patient verbalizes understanding

## 2017-10-05 ENCOUNTER — Ambulatory Visit: Payer: Self-pay | Admitting: Family Medicine

## 2017-10-21 ENCOUNTER — Other Ambulatory Visit: Payer: Self-pay | Admitting: Family Medicine

## 2017-10-21 MED ORDER — OMEPRAZOLE 40 MG PO CPDR: 40.0000 mg | DELAYED_RELEASE_CAPSULE | Freq: Every day | ORAL | 3 refills | Status: DC

## 2017-10-29 ENCOUNTER — Encounter: Payer: Self-pay | Admitting: Family Medicine

## 2017-10-29 ENCOUNTER — Ambulatory Visit (INDEPENDENT_AMBULATORY_CARE_PROVIDER_SITE_OTHER): Payer: Medicare HMO | Admitting: Family Medicine

## 2017-10-29 VITALS — BP 120/60 | HR 68 | Temp 97.8°F | Resp 14 | Ht 67.0 in | Wt 193.0 lb

## 2017-10-29 DIAGNOSIS — R8290 Unspecified abnormal findings in urine: Secondary | ICD-10-CM | POA: Diagnosis not present

## 2017-10-29 DIAGNOSIS — R300 Dysuria: Secondary | ICD-10-CM

## 2017-10-29 DIAGNOSIS — N3001 Acute cystitis with hematuria: Secondary | ICD-10-CM | POA: Diagnosis not present

## 2017-10-29 LAB — URINALYSIS, ROUTINE W REFLEX MICROSCOPIC
Bilirubin Urine: NEGATIVE
Glucose, UA: NEGATIVE
Hyaline Cast: NONE SEEN /LPF
Ketones, ur: NEGATIVE
Nitrite: POSITIVE — AB
Protein, ur: NEGATIVE
Specific Gravity, Urine: 1.02 (ref 1.001–1.03)
pH: 7 (ref 5.0–8.0)

## 2017-10-29 LAB — MICROSCOPIC MESSAGE

## 2017-10-29 MED ORDER — CEPHALEXIN 500 MG PO CAPS: 500.0000 mg | ORAL_CAPSULE | Freq: Three times a day (TID) | ORAL | 1 refills | Status: DC

## 2017-10-29 NOTE — Progress Notes (Signed)
Subjective:    Patient ID: Alexandra Calderon, female    DOB: Aug 18, 1941, 77 y.o.   MRN: [id number]  HPI Patient reports 3 days of dysuria, urgency, and foul-smelling urine.  Urinalysis shows leukocyte esterase, nitrites, and blood.  She has a history of frequent urinary tract infections Past Medical History:  Diagnosis Date  . Allergy   . Arthritis   . Breast cancer ([place])    right  . Colon adenomas [id number]  . Constipation   . Diverticulosis   . Esophagitis   . GERD (gastroesophageal reflux disease)   . Hearing loss   . Hyperlipidemia   . Hypertension   . Irritable bowel syndrome 08/27/2012  . Kidney stones   . Osteopenia   . Palpitations   . Personal history of radiation therapy   . Pneumonia   . Polyp, stomach   . PVC (premature ventricular contraction)   . UTI (lower urinary tract infection)    Past Surgical History:  Procedure Laterality Date  . BREAST LUMPECTOMY Right 07/01/11   w/sentinal node bx - right side  . COLONOSCOPY W/ BIOPSIES    . DILATION AND CURETTAGE OF UTERUS    . ESOPHAGOGASTRODUODENOSCOPY    . kidney stones    . shoulder arthroscopy rt 2009    . SHOULDER SURGERY     Current Outpatient Medications on File Prior to Visit  Medication Sig Dispense Refill  . ALPRAZolam (XANAX) 0.25 MG tablet Take 1 tablet (0.25 mg total) by mouth at bedtime as needed for anxiety or sleep. 90 tablet 1  . atenolol (TENORMIN) 50 MG tablet TAKE 2 TABLETS BY MOUTH EVERY DAY 180 tablet 1  . cholecalciferol (VITAMIN D) 1000 units tablet Take 2,000 Units by mouth daily.    . Coenzyme Q10 (CO Q 10 PO) Take 100 mg by mouth daily.    . famotidine (PEPCID) 20 MG tablet TAKE 1 TABLET BY MOUTH TWICE A DAY 180 tablet 1  . losartan (COZAAR) 25 MG tablet Take 25 mg by mouth daily.    Marland Kitchen omeprazole (PRILOSEC) 40 MG capsule Take 1 capsule (40 mg total) by mouth daily. 30 capsule 3  . OVER THE COUNTER MEDICATION Take 1 tablet by mouth daily. [facility] stool softener daily    . simvastatin  (ZOCOR) 20 MG tablet TAKE 1 TABLET BY MOUTH EVERY DAY 90 tablet 1  . spironolactone (ALDACTONE) 25 MG tablet Take 1 tablet (25 mg total) by mouth daily. 90 tablet 3   No current facility-administered medications on file prior to visit.    Allergies  Allergen Reactions  . Gatifloxacin     unknown  . Levofloxacin     REACTION: rash  . Nitrofurantoin Macrocrystal     unknown  . Sulfamethoxazole-Trimethoprim Rash and Other (See Comments)    Chills, fever  . Sulfonamide Derivatives Rash and Other (See Comments)    Chills, fever   Socioeconomic History  . Marital status: Married    Spouse name: Not on file  . Number of children: 2  . Years of education: Not on file  . Highest education level: Not on file  Social Needs  . Financial resource strain: Not on file  . Food insecurity - worry: Not on file  . Food insecurity - inability: Not on file  . Transportation needs - medical: Not on file  . Transportation needs - non-medical: Not on file  Occupational History  . Occupation: retired  Tobacco Use  . Smoking status: Never Smoker  .  Smokeless tobacco: Never Used  Substance and Sexual Activity  . Alcohol use: No  . Drug use: No  . Sexual activity: Yes  Other Topics Concern  . Not on file  Social History Narrative   Spouse:  [name]   Children:   [name]- age 9- [place]- age 52- [place]      Review of Systems  All other systems reviewed and are negative.      Objective:    Vital signs are reviewed and are normal.  Cardiovascular is regular rate and rhythm with no murmurs rubs or gallops.  Pulmonary exam is normal.  Abdomen is soft, nondistended, nontender.      Assessment & Plan:  Abnormal urine odor - Plan: Urinalysis, Routine w reflex microscopic  Dysuria - Plan: Urinalysis, Routine w reflex microscopic  Acute cystitis with hematuria Start Keflex 500 mg p.o. 3 times daily for 4 days   Based on her pulmonary exam, her high fever, I  am concerned that she is developing community-acquired pneumonia/walking pneumonia.  Due to her allergy profile, I will start the patient on a Z-Pak and reassess the patient in 48 hours.  She denies any myalgias.  She denies any rhinorrhea.  Therefore I do not believe this is the flu.

## 2017-11-23 ENCOUNTER — Other Ambulatory Visit: Payer: Medicare HMO

## 2017-11-23 DIAGNOSIS — Z79899 Other long term (current) drug therapy: Secondary | ICD-10-CM | POA: Diagnosis not present

## 2017-11-23 DIAGNOSIS — E785 Hyperlipidemia, unspecified: Secondary | ICD-10-CM | POA: Diagnosis not present

## 2017-11-23 DIAGNOSIS — I10 Essential (primary) hypertension: Secondary | ICD-10-CM | POA: Diagnosis not present

## 2017-11-23 LAB — CBC WITH DIFFERENTIAL/PLATELET
Basophils Absolute: 21 {cells}/uL (ref 0–200)
Basophils Relative: 0.4 %
Eosinophils Absolute: 42 {cells}/uL (ref 15–500)
Eosinophils Relative: 0.8 %
HCT: 40 % (ref 35.0–45.0)
Hemoglobin: 13.4 g/dL (ref 11.7–15.5)
Lymphs Abs: 1383 {cells}/uL (ref 850–3900)
MCH: 29.5 pg (ref 27.0–33.0)
MCHC: 33.5 g/dL (ref 32.0–36.0)
MCV: 87.9 fL (ref 80.0–100.0)
MPV: 11.3 fL (ref 7.5–12.5)
Monocytes Relative: 8.3 %
Neutro Abs: 3323 {cells}/uL (ref 1500–7800)
Neutrophils Relative %: 63.9 %
Platelets: 216 10*3/uL (ref 140–400)
RBC: 4.55 Million/uL (ref 3.80–5.10)
RDW: 13.5 % (ref 11.0–15.0)
Total Lymphocyte: 26.6 %
WBC mixed population: 432 {cells}/uL (ref 200–950)
WBC: 5.2 10*3/uL (ref 3.8–10.8)

## 2017-11-23 LAB — EXTRA LAV TOP TUBE

## 2017-11-23 LAB — COMPREHENSIVE METABOLIC PANEL WITH GFR
AG Ratio: 1.5 (calc) (ref 1.0–2.5)
ALT: 14 U/L (ref 6–29)
AST: 16 U/L (ref 10–35)
Albumin: 4.1 g/dL (ref 3.6–5.1)
Alkaline phosphatase (APISO): 94 U/L (ref 33–130)
BUN: 13 mg/dL (ref 7–25)
CO2: 30 mmol/L (ref 20–32)
Calcium: 9.3 mg/dL (ref 8.6–10.4)
Chloride: 106 mmol/L (ref 98–110)
Creat: 0.87 mg/dL (ref 0.60–0.93)
Globulin: 2.8 g/dL (ref 1.9–3.7)
Glucose, Bld: 97 mg/dL (ref 65–99)
Potassium: 4.4 mmol/L (ref 3.5–5.3)
Sodium: 143 mmol/L (ref 135–146)
Total Bilirubin: 0.6 mg/dL (ref 0.2–1.2)
Total Protein: 6.9 g/dL (ref 6.1–8.1)

## 2017-11-23 LAB — LIPID PANEL
Cholesterol: 147 mg/dL
HDL: 37 mg/dL — ABNORMAL LOW
LDL Cholesterol (Calc): 88 mg/dL
Non-HDL Cholesterol (Calc): 110 mg/dL
Total CHOL/HDL Ratio: 4 (calc)
Triglycerides: 123 mg/dL

## 2017-11-25 ENCOUNTER — Encounter: Payer: Self-pay | Admitting: Family Medicine

## 2017-12-01 ENCOUNTER — Other Ambulatory Visit: Payer: Self-pay

## 2017-12-01 DIAGNOSIS — C50211 Malignant neoplasm of upper-inner quadrant of right female breast: Secondary | ICD-10-CM

## 2017-12-01 NOTE — Progress Notes (Signed)
Orders placed for bone density.

## 2017-12-09 DIAGNOSIS — N898 Other specified noninflammatory disorders of vagina: Secondary | ICD-10-CM | POA: Diagnosis not present

## 2018-01-05 ENCOUNTER — Other Ambulatory Visit: Payer: Self-pay

## 2018-01-05 ENCOUNTER — Other Ambulatory Visit: Payer: Self-pay | Admitting: Internal Medicine

## 2018-01-05 MED ORDER — SIMVASTATIN 20 MG PO TABS: 20.0000 mg | ORAL_TABLET | Freq: Every day | ORAL | 1 refills | Status: DC

## 2018-01-05 MED ORDER — ATENOLOL 50 MG PO TABS: 100.0000 mg | ORAL_TABLET | Freq: Every day | ORAL | 1 refills | Status: DC

## 2018-01-15 ENCOUNTER — Other Ambulatory Visit: Payer: Self-pay | Admitting: Cardiovascular Disease

## 2018-02-12 ENCOUNTER — Encounter: Payer: Self-pay | Admitting: Adult Health

## 2018-02-12 ENCOUNTER — Inpatient Hospital Stay: Payer: Medicare HMO | Attending: Adult Health | Admitting: Adult Health

## 2018-02-12 VITALS — BP 107/52 | HR 68 | Temp 97.8°F | Resp 18 | Ht 67.0 in | Wt 195.5 lb

## 2018-02-12 DIAGNOSIS — Z853 Personal history of malignant neoplasm of breast: Secondary | ICD-10-CM | POA: Diagnosis not present

## 2018-02-12 DIAGNOSIS — E785 Hyperlipidemia, unspecified: Secondary | ICD-10-CM | POA: Insufficient documentation

## 2018-02-12 DIAGNOSIS — Z923 Personal history of irradiation: Secondary | ICD-10-CM | POA: Insufficient documentation

## 2018-02-12 DIAGNOSIS — C50211 Malignant neoplasm of upper-inner quadrant of right female breast: Secondary | ICD-10-CM

## 2018-02-12 DIAGNOSIS — Z9223 Personal history of estrogen therapy: Secondary | ICD-10-CM | POA: Diagnosis not present

## 2018-02-12 DIAGNOSIS — I10 Essential (primary) hypertension: Secondary | ICD-10-CM | POA: Diagnosis not present

## 2018-02-12 DIAGNOSIS — Z170 Estrogen receptor positive status [ER+]: Secondary | ICD-10-CM

## 2018-02-12 DIAGNOSIS — K219 Gastro-esophageal reflux disease without esophagitis: Secondary | ICD-10-CM | POA: Insufficient documentation

## 2018-02-12 DIAGNOSIS — M8580 Other specified disorders of bone density and structure, unspecified site: Secondary | ICD-10-CM | POA: Diagnosis not present

## 2018-02-12 DIAGNOSIS — Z800 Family history of malignant neoplasm of digestive organs: Secondary | ICD-10-CM | POA: Insufficient documentation

## 2018-02-12 DIAGNOSIS — Z79899 Other long term (current) drug therapy: Secondary | ICD-10-CM | POA: Insufficient documentation

## 2018-02-12 NOTE — Progress Notes (Signed)
CLINIC:  Survivorship   REASON FOR VISIT:  Routine follow-up for history of breast cancer.   BRIEF ONCOLOGIC HISTORY:    Breast cancer of upper-inner quadrant of right female breast ([place])   06/11/2011 Initial Diagnosis    Right breast mass biopsy: Invasive mammary cancer      07/01/2011 Surgery    Right breast lumpectomy: 2.2 cm IDC grade 1, 0/1 lymph node, ER/PR positive HER-2 negative Ki-67 16% T2 N0 M0 stage II a      08/15/2011 - 09/25/2011 Radiation Therapy    Adjuvant radiation therapy by Dr. Valere Dross      09/15/2011 - 02/12/2016 Anti-estrogen oral therapy    Arimidex 1 mg daily 5 years        INTERVAL HISTORY:  Alexandra Calderon presents to the [facility] today for routine follow-up for her history of breast cancer.  Overall, she reports feeling quite well. She has kept up with her mammograms annually.  She also has upcoming DEXA scheduled.  She exercises with walking 2-3 times per week.  She sees her PCP regularly.  Her other cancer screenings are up to date.      REVIEW OF SYSTEMS:  Review of Systems  Constitutional: Negative for appetite change, chills, fatigue, fever and unexpected weight change.  HENT:   Negative for hearing loss, lump/mass and trouble swallowing.   Eyes: Negative for eye problems and icterus.  Respiratory: Negative for chest tightness, cough and shortness of breath.   Cardiovascular: Negative for chest pain, leg swelling and palpitations.  Gastrointestinal: Negative for abdominal distention, abdominal pain, constipation, diarrhea, nausea and vomiting.  Endocrine: Negative for hot flashes.  Skin: Negative for itching and rash.  Neurological: Negative for dizziness, extremity weakness, headaches and numbness.  Hematological: Negative for adenopathy. Does not bruise/bleed easily.  Psychiatric/Behavioral: Negative for depression. The patient is not nervous/anxious.   Breast: Denies any new nodularity, masses, tenderness, nipple changes, or nipple  discharge.       PAST MEDICAL/SURGICAL HISTORY:  Past Medical History:  Diagnosis Date  . Allergy   . Arthritis   . Breast cancer ([place])    right  . Colon adenomas 1610,9604  . Constipation   . Diverticulosis   . Esophagitis   . GERD (gastroesophageal reflux disease)   . Hearing loss   . Hyperlipidemia   . Hypertension   . Irritable bowel syndrome 08/27/2012  . Kidney stones   . Osteopenia   . Palpitations   . Personal history of radiation therapy   . Pneumonia   . Polyp, stomach   . PVC (premature ventricular contraction)   . UTI (lower urinary tract infection)    Past Surgical History:  Procedure Laterality Date  . BREAST LUMPECTOMY Right 07/01/11   w/sentinal node bx - right side  . COLONOSCOPY W/ BIOPSIES    . DILATION AND CURETTAGE OF UTERUS    . ESOPHAGOGASTRODUODENOSCOPY    . kidney stones    . shoulder arthroscopy rt 2009    . SHOULDER SURGERY       ALLERGIES:  Allergies  Allergen Reactions  . Gatifloxacin     unknown  . Levofloxacin     REACTION: rash  . Nitrofurantoin Macrocrystal     unknown  . Azithromycin Rash  . Sulfamethoxazole-Trimethoprim Rash and Other (See Comments)    Chills, fever  . Sulfonamide Derivatives Rash and Other (See Comments)    Chills, fever     CURRENT MEDICATIONS:  Outpatient Encounter Medications as of 02/12/2018  Medication  Sig  . ALPRAZolam (XANAX) 0.25 MG tablet Take 1 tablet (0.25 mg total) by mouth at bedtime as needed for anxiety or sleep.  Marland Kitchen atenolol (TENORMIN) 50 MG tablet Take 2 tablets (100 mg total) by mouth daily.  . cephALEXin (KEFLEX) 500 MG capsule Take 1 capsule (500 mg total) by mouth 3 (three) times daily.  . cholecalciferol (VITAMIN D) 1000 units tablet Take 2,000 Units by mouth daily.  . Coenzyme Q10 (CO Q 10 PO) Take 100 mg by mouth daily.  Marland Kitchen losartan (COZAAR) 25 MG tablet Take 1 tablet (25 mg total) by mouth daily. Please make yearly appt with Dr. Acie Fredrickson for August for future refills. 1st  attempt  . omeprazole (PRILOSEC) 40 MG capsule Take 1 capsule (40 mg total) by mouth daily.  Marland Kitchen OVER THE COUNTER MEDICATION Take 1 tablet by mouth daily. [facility] stool softener daily  . simvastatin (ZOCOR) 20 MG tablet Take 1 tablet (20 mg total) by mouth daily.  Marland Kitchen spironolactone (ALDACTONE) 25 MG tablet Take 1 tablet (25 mg total) by mouth daily.   No facility-administered encounter medications on file as of 02/12/2018.      ONCOLOGIC FAMILY HISTORY:  Family History  Problem Relation Age of Onset  . Lymphoma Father   . Cancer Father   . Hodgkin's lymphoma Father   . Colon cancer Mother   . Diabetes Maternal Aunt   . Diabetes Paternal Grandmother   . Colon polyps Neg Hx   . Rectal cancer Neg Hx   . Stomach cancer Neg Hx      SOCIAL HISTORY:  Social History   Socioeconomic History  . Marital status: Married    Spouse name: Not on file  . Number of children: 2  . Years of education: Not on file  . Highest education level: Not on file  Occupational History  . Occupation: retired  Scientific laboratory technician  . Financial resource strain: Not on file  . Food insecurity:    Worry: Not on file    Inability: Not on file  . Transportation needs:    Medical: Not on file    Non-medical: Not on file  Tobacco Use  . Smoking status: Never Smoker  . Smokeless tobacco: Never Used  Substance and Sexual Activity  . Alcohol use: No  . Drug use: No  . Sexual activity: Yes  Lifestyle  . Physical activity:    Days per week: Not on file    Minutes per session: Not on file  . Stress: Not on file  Relationships  . Social connections:    Talks on phone: Not on file    Gets together: Not on file    Attends religious service: Not on file    Active member of club or organization: Not on file    Attends meetings of clubs or organizations: Not on file    Relationship status: Not on file  . Intimate partner violence:    Fear of current or ex partner: Not on file    Emotionally abused: Not on file     Physically abused: Not on file    Forced sexual activity: Not on file  Other Topics Concern  . Not on file  Social History Narrative   Spouse:  Alexandra Calderon   Children:   Alexandra Calderon- age 86- [place]   [place]ua- age 67- [place]     PHYSICAL EXAMINATION:  Vital Signs: Vitals:   02/12/18 1320  BP: (!) 107/52  Pulse: 68  Resp:  18  Temp: 97.8 F (36.6 C)  SpO2: 99%   Filed Weights   02/12/18 1320  Weight: 195 lb 8 oz (88.7 kg)   General: Well-nourished, well-appearing female in no acute distress.  Unaccompanied today.   HEENT: Head is normocephalic.  Pupils equal and reactive to light. Conjunctivae clear without exudate.  Sclerae anicteric. Oral mucosa is pink, moist.  Oropharynx is pink without lesions or erythema.  Lymph: No cervical, supraclavicular, or infraclavicular lymphadenopathy noted on palpation.  Cardiovascular: Regular rate and rhythm.Marland Kitchen Respiratory: Clear to auscultation bilaterally. Chest expansion symmetric; breathing non-labored.  Breast Exam:  -Left breast: No appreciable masses on palpation. No skin redness, thickening, or peau d'orange appearance; no nipple retraction or nipple discharge;  -Right breast: No appreciable masses on palpation. No skin redness, thickening, or peau d'orange appearance; no nipple retraction or nipple discharge; mild distortion in symmetry at previous lumpectomy site well healed scar without erythema or nodularity. -Axilla: No axillary adenopathy bilaterally.  GI: Abdomen soft and round; non-tender, non-distended. Bowel sounds normoactive. No hepatosplenomegaly.   GU: Deferred.  Neuro: No focal deficits. Steady gait.  Psych: Mood and affect normal and appropriate for situation.  MSK: No focal spinal tenderness to palpation, full range of motion in bilateral upper extremities Extremities: No edema. Skin: Warm and dry.  LABORATORY DATA:  None for this visit   DIAGNOSTIC IMAGING:  Most recent mammogram:      ASSESSMENT  AND PLAN:  Ms.. Cornia is a pleasant 77 y.o. female with history of Stage IIA right breast invasive ductal carcinoma, ER+/PR+/HER2-, diagnosed in 05/2011, treated with lumpectomy, adjuvant radiation therapy, and anti-estrogen therapy with Anastrozole x 5 years completing therapy in 02/2016.  She presents to the [facility] for surveillance and routine follow-up.   1. History of breast cancer:  Ms. Holleran is currently clinically and radiographically without evidence of disease or recurrence of breast cancer. She will be due for mammogram in 06/2018.  She is now almost 7 years out from her initial diagnosis of breast cancer.  This is favorable.  At this point, she can see [place] on an as needed basis.  I recommended she continue with annual mammography and breast exam with her PCP.   2. Bone health:  Given Ms. Willmon's age, history of breast cancer, and her previous anti-estrogen therapy with Anastrozole, she is at risk for bone demineralization. Her last DEXA scan was in 02/2016 and was consistent with osteopenia with a T score of -2.4 in the right femur.  Her next DEXA is scheduled later this month.  In the meantime, she was encouraged to increase her consumption of foods rich in calcium, as well as increase her weight-bearing activities.  She was given education on specific food and activities to promote bone health.  3. Cancer screening:  Due to Ms. Wisehart's history and her age, she should receive screening for skin cancers, colon cancer. She was encouraged to follow-up with her PCP for appropriate cancer screenings.   4. Health maintenance and wellness promotion: Ms. Rosal was encouraged to consume 5-7 servings of fruits and vegetables per day. She was also encouraged to engage in moderate to vigorous exercise for 30 minutes per day most days of the week. She was instructed to limit her alcohol consumption and continue to abstain from tobacco use.    Dispo:  -Return to cancer center PRN   A total of  (20) minutes of face-to-face time was spent with this patient with greater than 50% of that time  in counseling and care-coordination.   Gardenia Phlegm, NP Survivorship Program [facility] [phone]   Note: PRIMARY CARE PROVIDER Susy Frizzle, [place] [phone]

## 2018-02-12 NOTE — Patient Instructions (Signed)
Bone Health Bones protect organs, store calcium, and anchor muscles. Good health habits, such as eating nutritious foods and exercising regularly, are important for maintaining healthy bones. They can also help to prevent a condition that causes bones to lose density and become weak and brittle (osteoporosis). Why is bone mass important? Bone mass refers to the amount of bone tissue that you have. The higher your bone mass, the stronger your bones. An important step toward having healthy bones throughout life is to have strong and dense bones during childhood. A young adult who has a high bone mass is more likely to have a high bone mass later in life. Bone mass at its greatest it is called peak bone mass. A large decline in bone mass occurs in older adults. In women, it occurs about the time of menopause. During this time, it is important to practice good health habits, because if more bone is lost than what is replaced, the bones will become less healthy and more likely to break (fracture). If you find that you have a low bone mass, you may be able to prevent osteoporosis or further bone loss by changing your diet and lifestyle. How can I find out if my bone mass is low? Bone mass can be measured with an X-ray test that is called a bone mineral density (BMD) test. This test is recommended for all women who are age 65 or older. It may also be recommended for men who are age 70 or older, or for people who are more likely to develop osteoporosis due to:  Having bones that break easily.  Having a long-term disease that weakens bones, such as kidney disease or rheumatoid arthritis.  Having menopause earlier than normal.  Taking medicine that weakens bones, such as steroids, thyroid hormones, or hormone treatment for breast cancer or prostate cancer.  Smoking.  Drinking three or more alcoholic drinks each day.  What are the nutritional recommendations for healthy bones? To have healthy bones, you  need to get enough of the right minerals and vitamins. Most nutrition experts recommend getting these nutrients from the foods that you eat. Nutritional recommendations vary from person to person. Ask your health care provider what is healthy for you. Here are some general guidelines. Calcium Recommendations Calcium is the most important (essential) mineral for bone health. Most people can get enough calcium from their diet, but supplements may be recommended for people who are at risk for osteoporosis. Good sources of calcium include:  Dairy products, such as low-fat or nonfat milk, cheese, and yogurt.  Dark green leafy vegetables, such as bok choy and broccoli.  Calcium-fortified foods, such as orange juice, cereal, bread, soy beverages, and tofu products.  Nuts, such as almonds.  Follow these recommended amounts for daily calcium intake:  Children, age 1?3: 700 mg.  Children, age 4?8: 1,000 mg.  Children, age 9?13: 1,300 mg.  Teens, age 14?18: 1,300 mg.  Adults, age 19?50: 1,000 mg.  Adults, age 51?70: ? Men: 1,000 mg. ? Women: 1,200 mg.  Adults, age 71 or older: 1,200 mg.  Pregnant and breastfeeding females: ? Teens: 1,300 mg. ? Adults: 1,000 mg.  Vitamin D Recommendations Vitamin D is the most essential vitamin for bone health. It helps the body to absorb calcium. Sunlight stimulates the skin to make vitamin D, so be sure to get enough sunlight. If you live in a cold climate or you do not get outside often, your health care provider may recommend that you take vitamin   D supplements. Good sources of vitamin D in your diet include:  Egg yolks.  Saltwater fish.  Milk and cereal fortified with vitamin D.  Follow these recommended amounts for daily vitamin D intake:  Children and teens, age 1?18: 600 international units.  Adults, age 50 or younger: 400-800 international units.  Adults, age 51 or older: 800-1,000 international units.  Other Nutrients Other nutrients  for bone health include:  Phosphorus. This mineral is found in meat, poultry, dairy foods, nuts, and legumes. The recommended daily intake for adult men and adult women is 700 mg.  Magnesium. This mineral is found in seeds, nuts, dark green vegetables, and legumes. The recommended daily intake for adult men is 400?420 mg. For adult women, it is 310?320 mg.  Vitamin K. This vitamin is found in green leafy vegetables. The recommended daily intake is 120 mg for adult men and 90 mg for adult women.  What type of physical activity is best for building and maintaining healthy bones? Weight-bearing and strength-building activities are important for building and maintaining peak bone mass. Weight-bearing activities cause muscles and bones to work against gravity. Strength-building activities increases muscle strength that supports bones. Weight-bearing and muscle-building activities include:  Walking and hiking.  Jogging and running.  Dancing.  Gym exercises.  Lifting weights.  Tennis and racquetball.  Climbing stairs.  Aerobics.  Adults should get at least 30 minutes of moderate physical activity on most days. Children should get at least 60 minutes of moderate physical activity on most days. Ask your health care provide what type of exercise is best for you. Where can I find more information? For more information, check out the following websites:  National Osteoporosis Foundation: http://nof.org/learn/basics  National Institutes of Health: http://www.niams.nih.gov/Health_Info/Bone/Bone_Health/bone_health_for_life.asp  This information is not intended to replace advice given to you by your health care provider. Make sure you discuss any questions you have with your health care provider. Document Released: 12/20/2003 Document Revised: 04/18/2016 Document Reviewed: 10/04/2014 Elsevier Interactive Patient Education  2018 Elsevier Inc.  

## 2018-02-15 ENCOUNTER — Telehealth: Payer: Self-pay | Admitting: Adult Health

## 2018-02-15 NOTE — Telephone Encounter (Signed)
Per 5/3 no los °

## 2018-03-01 ENCOUNTER — Ambulatory Visit
Admission: RE | Admit: 2018-03-01 | Discharge: 2018-03-01 | Disposition: A | Discharge status: Home / Self Care | Payer: Medicare HMO | Source: Ambulatory Visit | Attending: Hematology and Oncology | Admitting: Hematology and Oncology

## 2018-03-01 DIAGNOSIS — Z780 Asymptomatic menopausal state: Secondary | ICD-10-CM | POA: Diagnosis not present

## 2018-03-01 DIAGNOSIS — M85852 Other specified disorders of bone density and structure, left thigh: Secondary | ICD-10-CM | POA: Diagnosis not present

## 2018-03-01 DIAGNOSIS — C50211 Malignant neoplasm of upper-inner quadrant of right female breast: Secondary | ICD-10-CM

## 2018-03-01 DIAGNOSIS — M85851 Other specified disorders of bone density and structure, right thigh: Secondary | ICD-10-CM | POA: Diagnosis not present

## 2018-03-30 DIAGNOSIS — M1990 Unspecified osteoarthritis, unspecified site: Secondary | ICD-10-CM | POA: Diagnosis not present

## 2018-03-30 DIAGNOSIS — Z6832 Body mass index (BMI) 32.0-32.9, adult: Secondary | ICD-10-CM | POA: Diagnosis not present

## 2018-03-30 DIAGNOSIS — R32 Unspecified urinary incontinence: Secondary | ICD-10-CM | POA: Diagnosis not present

## 2018-03-30 DIAGNOSIS — E669 Obesity, unspecified: Secondary | ICD-10-CM | POA: Diagnosis not present

## 2018-03-30 DIAGNOSIS — I10 Essential (primary) hypertension: Secondary | ICD-10-CM | POA: Diagnosis not present

## 2018-03-30 DIAGNOSIS — K5900 Constipation, unspecified: Secondary | ICD-10-CM | POA: Diagnosis not present

## 2018-03-30 DIAGNOSIS — G8929 Other chronic pain: Secondary | ICD-10-CM | POA: Diagnosis not present

## 2018-03-30 DIAGNOSIS — Z809 Family history of malignant neoplasm, unspecified: Secondary | ICD-10-CM | POA: Diagnosis not present

## 2018-03-30 DIAGNOSIS — Z853 Personal history of malignant neoplasm of breast: Secondary | ICD-10-CM | POA: Diagnosis not present

## 2018-03-30 DIAGNOSIS — E785 Hyperlipidemia, unspecified: Secondary | ICD-10-CM | POA: Diagnosis not present

## 2018-04-12 ENCOUNTER — Other Ambulatory Visit: Payer: Self-pay | Admitting: Cardiovascular Disease

## 2018-04-12 NOTE — Telephone Encounter (Signed)
Outpatient Medication Detail    Disp Refills Start End   losartan (COZAAR) 25 MG tablet 30 tablet 4 01/15/2018    Sig - Route: Take 1 tablet (25 mg total) by mouth daily. Please make yearly appt with Dr. Acie Fredrickson for August for future refills. 1st attempt - Oral   Sent to pharmacy as: losartan (COZAAR) 25 MG tablet   E-Prescribing Status: Receipt confirmed by pharmacy (01/15/2018 12:18 PM EDT)   Pharmacy   [id number] - [place], [place] - [address]

## 2018-04-20 ENCOUNTER — Ambulatory Visit (INDEPENDENT_AMBULATORY_CARE_PROVIDER_SITE_OTHER): Payer: Medicare HMO | Admitting: Family Medicine

## 2018-04-20 ENCOUNTER — Encounter: Payer: Self-pay | Admitting: Family Medicine

## 2018-04-20 VITALS — BP 120/60 | HR 68 | Temp 97.7°F | Resp 12 | Ht 67.0 in | Wt 193.0 lb

## 2018-04-20 DIAGNOSIS — R300 Dysuria: Secondary | ICD-10-CM | POA: Diagnosis not present

## 2018-04-20 MED ORDER — CEPHALEXIN 500 MG PO CAPS: 500.0000 mg | ORAL_CAPSULE | Freq: Three times a day (TID) | ORAL | 0 refills | Status: DC

## 2018-04-20 NOTE — Progress Notes (Signed)
Subjective:    Patient ID: Alexandra Calderon, female    DOB: [birth year], 77 y.o.   MRN: [id number]  HPI Patient believes she has been battling a bladder infection off and on for the last 2 to 3 weeks.  Symptoms are gradually getting worse.  She came in today because yesterday she started experiencing subjective fevers and chills.  Symptoms include mild dysuria particular at the end of her urinary stream.  She also reports increased frequency and urgency, however when she goes to urinate, she has a very weak dribbling urinary stream.  Shortly after completing urination, she will feel the urge to urinate again suggesting bladder irritability.  She is also developed some mild vague constant low back pain unrelated to motion or activity.  She denies any bowel or bladder incontinence.  She denies any numbness or tingling in her legs.  She denies any weakness in her legs.  She denies any radicular pain. Past Medical History:  Diagnosis Date  . Allergy   . Arthritis   . Breast cancer ([place])    right  . Colon adenomas [id number]  . Constipation   . Diverticulosis   . Esophagitis   . GERD (gastroesophageal reflux disease)   . Hearing loss   . Hyperlipidemia   . Hypertension   . Irritable bowel syndrome 08/27/2012  . Kidney stones   . Osteopenia   . Palpitations   . Personal history of radiation therapy   . Pneumonia   . Polyp, stomach   . PVC (premature ventricular contraction)   . UTI (lower urinary tract infection)    Past Surgical History:  Procedure Laterality Date  . BREAST LUMPECTOMY Right 07/01/11   w/sentinal node bx - right side  . COLONOSCOPY W/ BIOPSIES    . DILATION AND CURETTAGE OF UTERUS    . ESOPHAGOGASTRODUODENOSCOPY    . kidney stones    . shoulder arthroscopy rt 2009    . SHOULDER SURGERY     Current Outpatient Medications on File Prior to Visit  Medication Sig Dispense Refill  . ALPRAZolam (XANAX) 0.25 MG tablet Take 1 tablet (0.25 mg total) by mouth at bedtime as  needed for anxiety or sleep. 90 tablet 1  . atenolol (TENORMIN) 50 MG tablet TAKE 2 TABLETS BY MOUTH EVERY DAY 180 tablet 1  . cholecalciferol (VITAMIN D) 1000 units tablet Take 2,000 Units by mouth daily.    . Coenzyme Q10 (CO Q 10 PO) Take 100 mg by mouth daily.    . famotidine (PEPCID) 20 MG tablet TAKE 1 TABLET BY MOUTH TWICE A DAY 180 tablet 1  . losartan (COZAAR) 25 MG tablet Take 25 mg by mouth daily.    Marland Kitchen omeprazole (PRILOSEC) 40 MG capsule Take 1 capsule (40 mg total) by mouth daily. 30 capsule 3  . OVER THE COUNTER MEDICATION Take 1 tablet by mouth daily. [facility] stool softener daily    . simvastatin (ZOCOR) 20 MG tablet TAKE 1 TABLET BY MOUTH EVERY DAY 90 tablet 1  . spironolactone (ALDACTONE) 25 MG tablet Take 1 tablet (25 mg total) by mouth daily. 90 tablet 3   No current facility-administered medications on file prior to visit.    Allergies  Allergen Reactions  . Gatifloxacin     unknown  . Levofloxacin     REACTION: rash  . Nitrofurantoin Macrocrystal     unknown  . Sulfamethoxazole-Trimethoprim Rash and Other (See Comments)    Chills, fever  . Sulfonamide Derivatives Rash and Other (  See Comments)    Chills, fever   Socioeconomic History  . Marital status: Married    Spouse name: Not on file  . Number of children: 2  . Years of education: Not on file  . Highest education level: Not on file  Social Needs  . Financial resource strain: Not on file  . Food insecurity - worry: Not on file  . Food insecurity - inability: Not on file  . Transportation needs - medical: Not on file  . Transportation needs - non-medical: Not on file  Occupational History  . Occupation: retired  Tobacco Use  . Smoking status: Never Smoker  . Smokeless tobacco: Never Used  Substance and Sexual Activity  . Alcohol use: No  . Drug use: No  . Sexual activity: Yes  Other Topics Concern  . Not on file  Social History Narrative   Spouse:  [name]   Children:   [name]- age  56- [place]- age 21- [place]      Review of Systems  All other systems reviewed and are negative.      Objective:    Vital signs are reviewed and are normal.  Cardiovascular is regular rate and rhythm with no murmurs rubs or gallops.  Pulmonary exam is normal.  Abdomen is soft, nondistended, nontender.      Assessment & Plan:  Suspect urinary tract infection  Unfortunately, the patient was unable to catch her urine sample this morning and is unable to urinate now.  Therefore I will treat the patient empirically with Keflex 500 mg p.o. 3 times daily for 5 days.  Recommend repeating a urine sample if symptoms are not improving after 48 hours

## 2018-05-10 ENCOUNTER — Encounter: Payer: PPO | Admitting: Internal Medicine

## 2018-05-13 ENCOUNTER — Other Ambulatory Visit: Payer: Self-pay | Admitting: Cardiovascular Disease

## 2018-05-20 ENCOUNTER — Other Ambulatory Visit: Payer: Self-pay | Admitting: Obstetrics and Gynecology

## 2018-05-20 DIAGNOSIS — Z1231 Encounter for screening mammogram for malignant neoplasm of breast: Secondary | ICD-10-CM

## 2018-06-05 ENCOUNTER — Other Ambulatory Visit: Payer: Self-pay | Admitting: Cardiovascular Disease

## 2018-06-08 ENCOUNTER — Encounter: Payer: Self-pay | Admitting: Cardiovascular Disease

## 2018-06-08 ENCOUNTER — Ambulatory Visit: Payer: Medicare HMO | Admitting: Cardiovascular Disease

## 2018-06-08 VITALS — BP 140/73 | HR 63 | Ht 66.0 in | Wt 196.0 lb

## 2018-06-08 DIAGNOSIS — I493 Ventricular premature depolarization: Secondary | ICD-10-CM | POA: Diagnosis not present

## 2018-06-08 MED ORDER — LOSARTAN POTASSIUM 25 MG PO TABS: 25.0000 mg | ORAL_TABLET | Freq: Every day | ORAL | 3 refills | Status: DC

## 2018-06-08 MED ORDER — SPIRONOLACTONE 25 MG PO TABS: 25.0000 mg | ORAL_TABLET | Freq: Every day | ORAL | 3 refills | Status: DC

## 2018-06-08 NOTE — Progress Notes (Signed)
Cardiology Office Note   Date:  06/08/2018   ID:  Alexandra Calderon, Alexandra Calderon 28-Jun-1941, MRN [id number]  PCP:  Susy Frizzle, MD  Cardiologist:   Mertie Moores, MD   Chief Complaint  Patient presents with  . Hypertension   Problem List 1. Essential HTN 2. Palpitations - PVCs  3. Hyperlipidemia  4. Irritable bowel syndrome     History of Present Illness:  Alexandra Calderon is a 77 y.o. female who is being seen today for the evaluation of palpitations  at the request of Susy Frizzle, MD.  Seen with her husband   Alexandra Calderon  Has had palpitations all of her life.    Over the years, they have been stable for years.  Dr. Raliegh Ip increased her atenolol which seemed to have helped.   Has rare , isolated "thumps"  And then normal HR for a while These episodes may go on for several days Seem to be worse after eating , in the evening .  Do not limit her activity .  Not associated with weakness or dizziness, no dyspnea.    Does not get any regular exercise - tries to walk some   Aug. 20, 2018  Alexandra Calderon is here for follow up of her HTN and PVCs  Heart is now "quiet." Exercising some ,  No CP or dyspnea.   Aug. 27, 2019:  Feeling better.   Not as many PVCs now  Takes atenolol 50 mg BID  Discussed weight loss    Past Medical History:  Diagnosis Date  . Allergy   . Arthritis   . Breast cancer ([place])    right  . Colon adenomas 4034,7425  . Constipation   . Diverticulosis   . Esophagitis   . GERD (gastroesophageal reflux disease)   . Hearing loss   . Hyperlipidemia   . Hypertension   . Irritable bowel syndrome 08/27/2012  . Kidney stones   . Osteopenia   . Palpitations   . Personal history of radiation therapy   . Pneumonia   . Polyp, stomach   . PVC (premature ventricular contraction)   . UTI (lower urinary tract infection)     Past Surgical History:  Procedure Laterality Date  . BREAST LUMPECTOMY Right 07/01/11   w/sentinal node bx - right side  . COLONOSCOPY W/  BIOPSIES    . DILATION AND CURETTAGE OF UTERUS    . ESOPHAGOGASTRODUODENOSCOPY    . kidney stones    . shoulder arthroscopy rt 2009    . SHOULDER SURGERY       Current Outpatient Medications  Medication Sig Dispense Refill  . ALPRAZolam (XANAX) 0.25 MG tablet Take 1 tablet (0.25 mg total) by mouth at bedtime as needed for anxiety or sleep. 90 tablet 1  . atenolol (TENORMIN) 50 MG tablet Take 2 tablets (100 mg total) by mouth daily. 180 tablet 1  . cephALEXin (KEFLEX) 500 MG capsule Take 1 capsule (500 mg total) by mouth 3 (three) times daily. 21 capsule 0  . cholecalciferol (VITAMIN D) 1000 units tablet Take 2,000 Units by mouth daily.    . Coenzyme Q10 (CO Q 10 PO) Take 100 mg by mouth daily.    Marland Kitchen losartan (COZAAR) 25 MG tablet Take 1 tablet (25 mg total) by mouth daily. 30 tablet 0  . omeprazole (PRILOSEC) 40 MG capsule Take 1 capsule (40 mg total) by mouth daily. 90 capsule 3  . OVER THE COUNTER MEDICATION Take 1 tablet by mouth daily. [facility]  [employer] stool softener daily    . simvastatin (ZOCOR) 20 MG tablet Take 1 tablet (20 mg total) by mouth daily. 90 tablet 1  . spironolactone (ALDACTONE) 25 MG tablet Take 1 tablet (25 mg total) by mouth daily. 90 tablet 3   No current facility-administered medications for this visit.     PAD Screen 02/10/2017  Previous PAD dx? No  Previous surgical procedure? No  Pain with walking? No  Feet/toe relief with dangling? No  Painful, non-healing ulcers? No  Extremities discolored? No      Allergies:   Gatifloxacin; Levofloxacin; Nitrofurantoin macrocrystal; Azithromycin; Sulfamethoxazole-trimethoprim; and Sulfonamide derivatives    Social History:  The patient  reports that she has never smoked. She has never used smokeless tobacco. She reports that she does not drink alcohol or use drugs.   Family History:  The patient's family history includes Cancer in her father; Colon cancer in her mother; Diabetes in her maternal aunt and paternal  grandmother; Hodgkin's lymphoma in her father; Lymphoma in her father.    ROS:     Noted in current hx , otherwise negative   Physical Exam: Blood pressure 140/73, pulse 63, height 5\' 6"  (1.676 m), weight 196 lb (88.9 kg), SpO2 97 %.  GEN:   Middle-aged female, no acute distress HEENT: Normal NECK: No JVD; No carotid bruits LYMPHATICS: No lymphadenopathy CARDIAC: RRR   RESPIRATORY:  Clear to auscultation without rales, wheezing or rhonchi  ABDOMEN: Soft, non-tender, non-distended MUSCULOSKELETAL:  No edema; No deformity  SKIN: Warm and dry NEUROLOGIC:  Alert and oriented x 3   EKG:     Aug. 27, 2019:   NSR 63.   LAFB,   Voltage for LVH ,  Recent Labs: 11/23/2017: ALT 14; BUN 13; Creat 0.87; Hemoglobin 13.4; Platelets 216; Potassium 4.4; Sodium 143    Lipid Panel    Component Value Date/Time   CHOL 147 11/23/2017 0850   TRIG 123 11/23/2017 0850   HDL 37 (L) 11/23/2017 0850   CHOLHDL 4.0 11/23/2017 0850   VLDL 24.2 04/29/2017 0800   LDLCALC 88 11/23/2017 0850      Wt Readings from Last 3 Encounters:  06/08/18 196 lb (88.9 kg)  04/20/18 193 lb (87.5 kg)  02/12/18 195 lb 8 oz (88.7 kg)      Other studies Reviewed: Additional studies/ records that were reviewed today include: . Review of the above records demonstrates:    ASSESSMENT AND PLAN:  1.  Palpitations -   Palpitations have improved dramatically. In talking with her, it is clear that she eats lots of sugar.  She is lots of processed foods with very little nutritional value, little baby oatmeal pies, marshmellow  cream by the spoonful, pop tarts, soda.    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  She needs to greatly cut out her sugar intake.   Will likely help her palpitations  2. Hypertension:  Pressures well controlled.  She needs to lose weight.  She is working on that.     Current medicines are reviewed at length with the patient today.  The patient does not have concerns regarding  medicines.  Labs/ tests ordered today include:  No orders of the defined types were placed in this encounter.    Disposition:   FU with me in 1 year       Mertie Moores, MD  06/08/2018 9:44 AM    [address], [place], [place]  [address] Phone: [phone])  [phone]; Fax: [phone]

## 2018-06-08 NOTE — Patient Instructions (Signed)
Medication Instructions:  Your physician recommends that you continue on your current medications as directed. Please refer to the Current Medication list given to you today.   Labwork: None  Testing/Procedures: None  Follow-Up: Your physician wants you to follow-up in: 1 year with Dr. Nahser.   You will receive a reminder letter in the mail two months in advance. If you don't receive a letter, please call our office to schedule the follow-up appointment.   Any Other Special Instructions Will Be Listed Below (If Applicable).     If you need a refill on your cardiac medications before your next appointment, please call your pharmacy.   

## 2018-06-16 ENCOUNTER — Other Ambulatory Visit: Payer: Self-pay | Admitting: Family Medicine

## 2018-06-16 DIAGNOSIS — Z79899 Other long term (current) drug therapy: Secondary | ICD-10-CM

## 2018-06-16 DIAGNOSIS — Z0000 Encounter for general adult medical examination without abnormal findings: Secondary | ICD-10-CM

## 2018-06-16 DIAGNOSIS — E785 Hyperlipidemia, unspecified: Secondary | ICD-10-CM

## 2018-06-16 DIAGNOSIS — I10 Essential (primary) hypertension: Secondary | ICD-10-CM

## 2018-06-17 ENCOUNTER — Other Ambulatory Visit: Payer: Medicare HMO

## 2018-06-17 DIAGNOSIS — E785 Hyperlipidemia, unspecified: Secondary | ICD-10-CM

## 2018-06-17 DIAGNOSIS — I10 Essential (primary) hypertension: Secondary | ICD-10-CM

## 2018-06-17 DIAGNOSIS — Z79899 Other long term (current) drug therapy: Secondary | ICD-10-CM

## 2018-06-17 DIAGNOSIS — Z0000 Encounter for general adult medical examination without abnormal findings: Secondary | ICD-10-CM | POA: Diagnosis not present

## 2018-06-18 LAB — CBC WITH DIFFERENTIAL/PLATELET
Basophils Absolute: 30 {cells}/uL (ref 0–200)
Basophils Relative: 0.6 %
Eosinophils Absolute: 80 {cells}/uL (ref 15–500)
Eosinophils Relative: 1.6 %
HCT: 41.8 % (ref 35.0–45.0)
Hemoglobin: 13.6 g/dL (ref 11.7–15.5)
Lymphs Abs: 1585 {cells}/uL (ref 850–3900)
MCH: 29.5 pg (ref 27.0–33.0)
MCHC: 32.5 g/dL (ref 32.0–36.0)
MCV: 90.7 fL (ref 80.0–100.0)
MPV: 11.5 fL (ref 7.5–12.5)
Monocytes Relative: 10.1 %
Neutro Abs: 2800 {cells}/uL (ref 1500–7800)
Neutrophils Relative %: 56 %
Platelets: 211 10*3/uL (ref 140–400)
RBC: 4.61 Million/uL (ref 3.80–5.10)
RDW: 13.9 % (ref 11.0–15.0)
Total Lymphocyte: 31.7 %
WBC mixed population: 505 {cells}/uL (ref 200–950)
WBC: 5 10*3/uL (ref 3.8–10.8)

## 2018-06-18 LAB — COMPREHENSIVE METABOLIC PANEL WITH GFR
AG Ratio: 1.4 (calc) (ref 1.0–2.5)
ALT: 15 U/L (ref 6–29)
AST: 18 U/L (ref 10–35)
Albumin: 3.9 g/dL (ref 3.6–5.1)
Alkaline phosphatase (APISO): 85 U/L (ref 33–130)
BUN/Creatinine Ratio: 15 (calc) (ref 6–22)
BUN: 17 mg/dL (ref 7–25)
CO2: 29 mmol/L (ref 20–32)
Calcium: 9.2 mg/dL (ref 8.6–10.4)
Chloride: 104 mmol/L (ref 98–110)
Creat: 1.13 mg/dL — ABNORMAL HIGH (ref 0.60–0.93)
Globulin: 2.7 g/dL (ref 1.9–3.7)
Glucose, Bld: 95 mg/dL (ref 65–99)
Potassium: 4.6 mmol/L (ref 3.5–5.3)
Sodium: 140 mmol/L (ref 135–146)
Total Bilirubin: 0.6 mg/dL (ref 0.2–1.2)
Total Protein: 6.6 g/dL (ref 6.1–8.1)

## 2018-06-18 LAB — LIPID PANEL
Cholesterol: 126 mg/dL
HDL: 35 mg/dL — ABNORMAL LOW
LDL Cholesterol (Calc): 71 mg/dL
Non-HDL Cholesterol (Calc): 91 mg/dL
Total CHOL/HDL Ratio: 3.6 (calc)
Triglycerides: 116 mg/dL

## 2018-06-21 ENCOUNTER — Ambulatory Visit (INDEPENDENT_AMBULATORY_CARE_PROVIDER_SITE_OTHER): Payer: Medicare HMO | Admitting: Family Medicine

## 2018-06-21 ENCOUNTER — Encounter: Payer: Self-pay | Admitting: Family Medicine

## 2018-06-21 VITALS — BP 128/62 | HR 60 | Temp 97.7°F | Resp 14 | Ht 67.0 in | Wt 195.0 lb

## 2018-06-21 DIAGNOSIS — E785 Hyperlipidemia, unspecified: Secondary | ICD-10-CM | POA: Diagnosis not present

## 2018-06-21 DIAGNOSIS — Z23 Encounter for immunization: Secondary | ICD-10-CM

## 2018-06-21 DIAGNOSIS — M85851 Other specified disorders of bone density and structure, right thigh: Secondary | ICD-10-CM

## 2018-06-21 DIAGNOSIS — I10 Essential (primary) hypertension: Secondary | ICD-10-CM | POA: Diagnosis not present

## 2018-06-21 DIAGNOSIS — Z0000 Encounter for general adult medical examination without abnormal findings: Secondary | ICD-10-CM | POA: Diagnosis not present

## 2018-06-21 MED ORDER — SIMVASTATIN 20 MG PO TABS: 20.0000 mg | ORAL_TABLET | Freq: Every day | ORAL | 3 refills | Status: DC

## 2018-06-21 MED ORDER — ATENOLOL 50 MG PO TABS: 50.0000 mg | ORAL_TABLET | Freq: Two times a day (BID) | ORAL | 3 refills | Status: DC

## 2018-06-21 NOTE — Progress Notes (Signed)
Subjective:    Patient ID: Alexandra Calderon, female    DOB: [birth year], 77 y.o.   MRN: [id number]  HPI Patient is a very pleasant 77 year old white female who has a past medical history significant for breast cancer.  This was treated with lumpectomy and postoperative hormone therapy according to the patient although she did not take tamoxifen.  Her mammogram is scheduled for next week.  She is here today for a complete physical exam.  Her last colonoscopy was in 2016 and is good until 2021.  She sees her gynecologist later this month for her Pap smear.  Her last bone density test was performed earlier this year and showed a T score of -2.3 at the right femoral neck.  I have recommended calcium 1200 mg a day as well as vitamin D 1000 units a day.  We have discussed using Fosamax given the location of her osteopenia and at the present time, the patient is recalcitrant to returning on Fosamax.  She is taken this in the past.  She also has a history of hypertension, GERD which is refractory to Pepcid, hyperlipidemia/dyslipidemia, irritable bowel syndrome predominantly with constipation, and occasional frequent PVCs.   Immunization History  Administered Date(s) Administered  . DTP 05/24/2007  . Influenza Split 06/29/2012  . Influenza Whole 10/14/1999, 07/29/2007, 07/28/2008, 07/05/2009, 07/26/2010  . Influenza, High Dose Seasonal PF 07/25/2014, 08/02/2015, 06/25/2016, 06/16/2017  . Influenza,inj,Quad PF,6+ Mos 07/05/2013, 06/21/2018  . Pneumococcal Conjugate-13 08/15/2013  . Pneumococcal Polysaccharide-23 10/13/2002, 04/19/2015  . Td 10/13/2006, 05/24/2007  . Zoster 08/05/2016     Past Medical History:  Diagnosis Date  . Allergy   . Arthritis   . Breast cancer ([place])    right  . Colon adenomas 7408,1448  . Constipation   . Diverticulosis   . Esophagitis   . GERD (gastroesophageal reflux disease)   . Hearing loss   . Hyperlipidemia   . Hypertension   . Irritable bowel syndrome 08/27/2012    . Kidney stones   . Osteopenia   . Palpitations   . Personal history of radiation therapy   . Pneumonia   . Polyp, stomach   . PVC (premature ventricular contraction)   . UTI (lower urinary tract infection)    Past Surgical History:  Procedure Laterality Date  . BREAST LUMPECTOMY Right 07/01/11   w/sentinal node bx - right side  . COLONOSCOPY W/ BIOPSIES    . DILATION AND CURETTAGE OF UTERUS    . ESOPHAGOGASTRODUODENOSCOPY    . kidney stones    . shoulder arthroscopy rt 2009    . SHOULDER SURGERY      Current Outpatient Medications on File Prior to Visit  Medication Sig Dispense Refill  . cephALEXin (KEFLEX) 500 MG capsule Take 1 capsule (500 mg total) by mouth 3 (three) times daily. 21 capsule 0  . cholecalciferol (VITAMIN D) 1000 units tablet Take 2,000 Units by mouth daily.    . Coenzyme Q10 (CO Q 10 PO) Take 100 mg by mouth daily.    Marland Kitchen losartan (COZAAR) 25 MG tablet Take 1 tablet (25 mg total) by mouth daily. 90 tablet 3  . omeprazole (PRILOSEC) 40 MG capsule Take 1 capsule (40 mg total) by mouth daily. 90 capsule 3  . OVER THE COUNTER MEDICATION Take 1 tablet by mouth daily. [facility] stool softener daily    . spironolactone (ALDACTONE) 25 MG tablet Take 1 tablet (25 mg total) by mouth daily. 90 tablet 3   No current facility-administered medications  on file prior to visit.    Allergies  Allergen Reactions  . Gatifloxacin     unknown  . Levofloxacin     REACTION: rash  . Nitrofurantoin Macrocrystal     unknown  . Azithromycin Rash  . Sulfamethoxazole-Trimethoprim Rash and Other (See Comments)    Chills, fever  . Sulfonamide Derivatives Rash and Other (See Comments)    Chills, fever   Social History   Socioeconomic History  . Marital status: Married    Spouse name: Not on file  . Number of children: 2  . Years of education: Not on file  . Highest education level: Not on file  Occupational History  . Occupation: retired  Scientific laboratory technician  . Financial resource  strain: Not on file  . Food insecurity:    Worry: Not on file    Inability: Not on file  . Transportation needs:    Medical: Not on file    Non-medical: Not on file  Tobacco Use  . Smoking status: Never Smoker  . Smokeless tobacco: Never Used  Substance and Sexual Activity  . Alcohol use: No  . Drug use: No  . Sexual activity: Yes  Lifestyle  . Physical activity:    Days per week: Not on file    Minutes per session: Not on file  . Stress: Not on file  Relationships  . Social connections:    Talks on phone: Not on file    Gets together: Not on file    Attends religious service: Not on file    Active member of club or organization: Not on file    Attends meetings of clubs or organizations: Not on file    Relationship status: Not on file  . Intimate partner violence:    Fear of current or ex partner: Not on file    Emotionally abused: Not on file    Physically abused: Not on file    Forced sexual activity: Not on file  Other Topics Concern  . Not on file  Social History Narrative   Spouse:  [name]   Children:   [name]- age 54- [place]- age 28- [place]   Family History  Problem Relation Age of Onset  . Lymphoma Father   . Cancer Father   . Hodgkin's lymphoma Father   . Colon cancer Mother   . Diabetes Maternal Aunt   . Diabetes Paternal Grandmother   . Colon polyps Neg Hx   . Rectal cancer Neg Hx   . Stomach cancer Neg Hx      Review of Systems  All other systems reviewed and are negative.      Objective:   Physical Exam  Constitutional: She is oriented to person, place, and time. She appears well-developed and well-nourished. No distress.  HENT:  Head: Normocephalic and atraumatic.  Right Ear: External ear normal.  Left Ear: External ear normal.  Nose: Nose normal.  Mouth/Throat: Oropharynx is clear and moist.  Eyes: Pupils are equal, round, and reactive to light. Conjunctivae and EOM are normal. Right eye exhibits no  discharge. Left eye exhibits no discharge. No scleral icterus.  Neck: Normal range of motion. Neck supple. No JVD present. No tracheal deviation present. No thyromegaly present.  Cardiovascular: Normal rate, regular rhythm, normal heart sounds and intact distal pulses. Exam reveals no gallop and no friction rub.  No murmur heard. Pulmonary/Chest: Effort normal and breath sounds normal. No stridor. No respiratory distress. She has no wheezes.  She has no rales. She exhibits no tenderness.  Abdominal: Soft. Bowel sounds are normal. She exhibits no distension and no mass. There is no tenderness. There is no rebound and no guarding.  Musculoskeletal: Normal range of motion. She exhibits no edema, tenderness or deformity.  Lymphadenopathy:    She has no cervical adenopathy.  Neurological: She is alert and oriented to person, place, and time. She has normal reflexes. No cranial nerve deficit. She exhibits normal muscle tone. Coordination normal.  Skin: Skin is warm. No rash noted. She is not diaphoretic. No erythema. No pallor.  Psychiatric: She has a normal mood and affect. Her behavior is normal. Judgment and thought content normal.  Vitals reviewed.         Assessment & Plan:  Need for prophylactic vaccination and inoculation against influenza - Plan: Flu Vaccine QUAD 36+ mos IM  Medicare annual wellness visit, subsequent  Essential hypertension  Dyslipidemia  Osteopenia of neck of right femur  Patient's exam is completely normal.  We discussed her osteopenia.  She elects to use calcium 1200 mg a day and vitamin D 2000 units a day and recheck a bone density test in 2 years.  She declines Fosamax at the present time.  Her mammogram is scheduled for next week.  Her Pap smear scheduled for later this month.  Her colonoscopy is not due again until 2021.  She received her flu shot today.  The remainder of her immunizations are up-to-date.  Her blood pressure is well controlled.  Her cholesterol  is excellent.  I did encourage increasing aerobic exercise to help address her dyslipidemia.  She denies any problems with falls or depression.  She denies any problems on her functional assessment.  Reassess in 6 months or as needed

## 2018-06-22 ENCOUNTER — Telehealth: Payer: Self-pay | Admitting: Family Medicine

## 2018-06-22 MED ORDER — ALENDRONATE SODIUM 70 MG PO TABS: 70.0000 mg | ORAL_TABLET | ORAL | 3 refills | Status: DC

## 2018-06-22 NOTE — Telephone Encounter (Signed)
Pt came in office today and has changed her mind about taking the Fosamax - she would like to try it and request that we only call in 4 pills to local pharmacy to make sure she can tolerate.  Per Dr. Dennard Schaumann ok to call in med.   Med sent to pharm.

## 2018-06-24 ENCOUNTER — Ambulatory Visit
Admission: RE | Admit: 2018-06-24 | Discharge: 2018-06-24 | Disposition: A | Discharge status: Home / Self Care | Payer: Medicare HMO | Source: Ambulatory Visit | Attending: Obstetrics and Gynecology | Admitting: Obstetrics and Gynecology

## 2018-06-24 DIAGNOSIS — Z1231 Encounter for screening mammogram for malignant neoplasm of breast: Secondary | ICD-10-CM | POA: Diagnosis not present

## 2018-07-22 DIAGNOSIS — H5211 Myopia, right eye: Secondary | ICD-10-CM | POA: Diagnosis not present

## 2018-07-22 DIAGNOSIS — H524 Presbyopia: Secondary | ICD-10-CM | POA: Diagnosis not present

## 2018-07-22 DIAGNOSIS — H2513 Age-related nuclear cataract, bilateral: Secondary | ICD-10-CM | POA: Diagnosis not present

## 2018-07-22 DIAGNOSIS — H52203 Unspecified astigmatism, bilateral: Secondary | ICD-10-CM | POA: Diagnosis not present

## 2018-07-27 ENCOUNTER — Ambulatory Visit
Admission: RE | Admit: 2018-07-27 | Discharge: 2018-07-27 | Disposition: A | Discharge status: Home / Self Care | Payer: Medicare HMO | Source: Ambulatory Visit | Attending: Family Medicine | Admitting: Family Medicine

## 2018-07-27 ENCOUNTER — Ambulatory Visit (INDEPENDENT_AMBULATORY_CARE_PROVIDER_SITE_OTHER): Payer: Medicare HMO | Admitting: Family Medicine

## 2018-07-27 ENCOUNTER — Encounter: Payer: Self-pay | Admitting: Family Medicine

## 2018-07-27 VITALS — BP 126/74 | HR 64 | Temp 97.7°F | Resp 14 | Ht 67.0 in | Wt 197.0 lb

## 2018-07-27 DIAGNOSIS — M545 Low back pain, unspecified: Secondary | ICD-10-CM

## 2018-07-27 DIAGNOSIS — M47816 Spondylosis without myelopathy or radiculopathy, lumbar region: Secondary | ICD-10-CM | POA: Diagnosis not present

## 2018-07-27 DIAGNOSIS — M549 Dorsalgia, unspecified: Secondary | ICD-10-CM | POA: Diagnosis not present

## 2018-07-27 DIAGNOSIS — M47814 Spondylosis without myelopathy or radiculopathy, thoracic region: Secondary | ICD-10-CM | POA: Diagnosis not present

## 2018-07-27 DIAGNOSIS — M48061 Spinal stenosis, lumbar region without neurogenic claudication: Secondary | ICD-10-CM | POA: Diagnosis not present

## 2018-07-27 NOTE — Progress Notes (Signed)
Subjective:    Patient ID: Alexandra Calderon, female    DOB: 1941/10/13, 77 y.o.   MRN: [id number]  HPI Patient has been dealing with low back pain and mid back pain for approximately 2 months now.  Pain is located approximately the level of T11-L2.  It radiates into her right flank.  Movement makes it worse.  She denies any sciatica in her legs.  She denies any numbness or weakness in her legs.  She denies any burning paresthesias.  She denies any pleurisy or hemoptysis.  She denies any fevers or chills.  She is mildly tender to palpation over the 12th rib posteriorly on the right side.  She is also tender to palpation in the thoracic and lumbar paraspinal muscles.  She denies any falls or injury Past Medical History:  Diagnosis Date  . Allergy   . Arthritis   . Breast cancer ([place])    right  . Colon adenomas 7564,3329  . Constipation   . Diverticulosis   . Esophagitis   . GERD (gastroesophageal reflux disease)   . Hearing loss   . Hyperlipidemia   . Hypertension   . Irritable bowel syndrome 08/27/2012  . Kidney stones   . Osteopenia   . Palpitations   . Personal history of radiation therapy   . Pneumonia   . Polyp, stomach   . PVC (premature ventricular contraction)   . UTI (lower urinary tract infection)    Past Surgical History:  Procedure Laterality Date  . BREAST LUMPECTOMY Right 07/01/11   w/sentinal node bx - right side  . COLONOSCOPY W/ BIOPSIES    . DILATION AND CURETTAGE OF UTERUS    . ESOPHAGOGASTRODUODENOSCOPY    . kidney stones    . shoulder arthroscopy rt 2009    . SHOULDER SURGERY     Current Outpatient Medications on File Prior to Visit  Medication Sig Dispense Refill  . ALPRAZolam (XANAX) 0.25 MG tablet Take 1 tablet (0.25 mg total) by mouth at bedtime as needed for anxiety or sleep. 90 tablet 1  . atenolol (TENORMIN) 50 MG tablet TAKE 2 TABLETS BY MOUTH EVERY DAY 180 tablet 1  . cholecalciferol (VITAMIN D) 1000 units tablet Take 2,000 Units by mouth daily.     . Coenzyme Q10 (CO Q 10 PO) Take 100 mg by mouth daily.    . famotidine (PEPCID) 20 MG tablet TAKE 1 TABLET BY MOUTH TWICE A DAY 180 tablet 1  . losartan (COZAAR) 25 MG tablet Take 25 mg by mouth daily.    Marland Kitchen omeprazole (PRILOSEC) 40 MG capsule Take 1 capsule (40 mg total) by mouth daily. 30 capsule 3  . OVER THE COUNTER MEDICATION Take 1 tablet by mouth daily. [facility] stool softener daily    . simvastatin (ZOCOR) 20 MG tablet TAKE 1 TABLET BY MOUTH EVERY DAY 90 tablet 1  . spironolactone (ALDACTONE) 25 MG tablet Take 1 tablet (25 mg total) by mouth daily. 90 tablet 3   No current facility-administered medications on file prior to visit.    Allergies  Allergen Reactions  . Gatifloxacin     unknown  . Levofloxacin     REACTION: rash  . Nitrofurantoin Macrocrystal     unknown  . Sulfamethoxazole-Trimethoprim Rash and Other (See Comments)    Chills, fever  . Sulfonamide Derivatives Rash and Other (See Comments)    Chills, fever   Socioeconomic History  . Marital status: Married    Spouse name: Not on file  .  Number of children: 2  . Years of education: Not on file  . Highest education level: Not on file  Social Needs  . Financial resource strain: Not on file  . Food insecurity - worry: Not on file  . Food insecurity - inability: Not on file  . Transportation needs - medical: Not on file  . Transportation needs - non-medical: Not on file  Occupational History  . Occupation: retired  Tobacco Use  . Smoking status: Never Smoker  . Smokeless tobacco: Never Used  Substance and Sexual Activity  . Alcohol use: No  . Drug use: No  . Sexual activity: Yes  Other Topics Concern  . Not on file  Social History Narrative   Spouse:  [name]   Children:   [name]- age 20- [place]- age 55- [place]      Review of Systems  Musculoskeletal: Positive for back pain.  All other systems reviewed and are negative.      Objective:    Vital signs are  reviewed and are normal.  Cardiovascular is regular rate and rhythm with no murmurs rubs or gallops.  Pulmonary exam is normal.  Abdomen is soft, nondistended, nontender. Patient has tenderness to palpation in the lumbar and thoracic paraspinal muscles on the right side.  She is also tender to palpation over the posterior aspect of the right 12th rib.  She has normal range of motion.  She has muscle strength 5/5 equal and symmetric in the upper and lower extremities.  She has negative straight leg raise and normal reflexes.     Assessment & Plan:  Mid back pain - Plan: DG Lumbar Spine Complete, DG Thoracic Spine W/Swimmers  Acute right-sided low back pain without sciatica - Plan: DG Lumbar Spine Complete, DG Thoracic Spine W/Swimmers  Obtain an x-ray of the thoracic and lumbar spine.  Suspect degenerative disc disease and a.  If so I would likely recommend physical therapy in an effort to treat.  She denies any dysuria hematuria urgency or frequency and therefore I do not believe this is urinary tract in nature.  Await the results of the x-ray

## 2018-08-24 DIAGNOSIS — R69 Illness, unspecified: Secondary | ICD-10-CM | POA: Diagnosis not present

## 2018-09-15 ENCOUNTER — Other Ambulatory Visit: Payer: Self-pay | Admitting: Family Medicine

## 2018-10-26 ENCOUNTER — Other Ambulatory Visit: Payer: Self-pay | Admitting: Family Medicine

## 2018-11-08 ENCOUNTER — Telehealth: Payer: Self-pay | Admitting: Cardiovascular Disease

## 2018-11-08 DIAGNOSIS — I493 Ventricular premature depolarization: Secondary | ICD-10-CM

## 2018-11-08 NOTE — Telephone Encounter (Signed)
Lets get a 48 hr holter to quantitate her PVCs.  Make sure she is not eating lots of sugary foods

## 2018-11-08 NOTE — Telephone Encounter (Signed)
Spoke with patient who states PVCs have been very bothersome since yesterday. States she has increased intake of dietary K+ and is currently drinking V8 juice and these things seem to help. States HR generally 56 bpm, has not measured BP in a month or more. Verbalizes compliance with atenolol 50 mg BID. I advised that wearing a monitor to determine PVC burden may be an option or Dr. Acie Fredrickson may want to add or change medications. I advised I will call her back with his advice this afternoon. She verbalized understanding and agreement and thanked me for the call.

## 2018-11-08 NOTE — Telephone Encounter (Signed)
New Message   Pt has PVC and she says it is bothering her really bad right now. She thinks her potassium is low, she will drink orange juice, v8 juice and a banana and will feel better for a little while Her doctor told her not to take over the counter potassium because it wont show a good reading when she comes to have her potassium checked  Please call  I did schedule an appt for for 02/12 but PT wants sooner appt.

## 2018-11-08 NOTE — Telephone Encounter (Signed)
Reviewed Dr. Elmarie Shiley advice with patient who verbalized understanding and agreement. She states she has decreased her intake of sugary foods. I asked about the comment about taking oral potassium interfering with her lab work and advised that OTC K+ tabs are very low dose, usually 1 mEq. I reviewed her lab work from 9/19 and patient's serum K+ was 4.6 mmol/L which is in normal range. I advised that a scheduler will call her to schedule her monitor appointment and to call back with questions or concerns. I advised that we will leave appointment on 2/12 with Robbie Lis, PA until after monitor results are available.  She thanked me for the call.

## 2018-11-09 ENCOUNTER — Telehealth: Payer: Self-pay | Admitting: Family Medicine

## 2018-11-10 NOTE — Telephone Encounter (Signed)
Error

## 2018-11-11 ENCOUNTER — Ambulatory Visit (INDEPENDENT_AMBULATORY_CARE_PROVIDER_SITE_OTHER): Payer: Medicare HMO | Admitting: Family Medicine

## 2018-11-11 VITALS — BP 130/62 | HR 66 | Temp 98.0°F | Resp 14 | Ht 67.0 in | Wt 197.0 lb

## 2018-11-11 DIAGNOSIS — F419 Anxiety disorder, unspecified: Secondary | ICD-10-CM | POA: Diagnosis not present

## 2018-11-11 DIAGNOSIS — R69 Illness, unspecified: Secondary | ICD-10-CM | POA: Diagnosis not present

## 2018-11-11 DIAGNOSIS — I493 Ventricular premature depolarization: Secondary | ICD-10-CM

## 2018-11-11 MED ORDER — DILTIAZEM HCL 30 MG PO TABS: 30.0000 mg | ORAL_TABLET | Freq: Four times a day (QID) | ORAL | 0 refills | Status: DC | PRN

## 2018-11-11 MED ORDER — ALPRAZOLAM 0.25 MG PO TABS: 0.2500 mg | ORAL_TABLET | Freq: Three times a day (TID) | ORAL | 0 refills | Status: DC | PRN

## 2018-11-11 NOTE — Progress Notes (Signed)
Subjective:    Patient ID: Alexandra Calderon, female    DOB: 06-07-1941, 78 y.o.   MRN: [id number]  HPI Patient has a history of PVCs.  She states that recently she has been having extremely frequent PVCs.  They will occur every 6 through 7 heartbeat.  This will sometimes last several hours.  When this happens, she becomes extremely anxious and panicked.  She is called her cardiologist who is scheduled her to wear a monitor on February 13.  She is requesting Xanax to help control the anxiety until she gets the monitor and treatment is determined.  She is on atenolol already and she has borderline bradycardia throughout the day with her heart rate in the 60s on the atenolol.  She denies any syncope or near syncope.  She denies any chest pain or shortness of breath. Past Medical History:  Diagnosis Date  . Allergy   . Arthritis   . Breast cancer ([place])    right  . Colon adenomas [id number]  . Constipation   . Diverticulosis   . Esophagitis   . GERD (gastroesophageal reflux disease)   . Hearing loss   . Hyperlipidemia   . Hypertension   . Irritable bowel syndrome 08/27/2012  . Kidney stones   . Osteopenia   . Palpitations   . Personal history of radiation therapy   . Pneumonia   . Polyp, stomach   . PVC (premature ventricular contraction)   . UTI (lower urinary tract infection)    Past Surgical History:  Procedure Laterality Date  . BREAST LUMPECTOMY Right 07/01/11   w/sentinal node bx - right side  . COLONOSCOPY W/ BIOPSIES    . DILATION AND CURETTAGE OF UTERUS    . ESOPHAGOGASTRODUODENOSCOPY    . kidney stones    . shoulder arthroscopy rt 2009    . SHOULDER SURGERY     Current Outpatient Medications on File Prior to Visit  Medication Sig Dispense Refill  . ALPRAZolam (XANAX) 0.25 MG tablet Take 1 tablet (0.25 mg total) by mouth at bedtime as needed for anxiety or sleep. 90 tablet 1  . atenolol (TENORMIN) 50 MG tablet TAKE 2 TABLETS BY MOUTH EVERY DAY 180 tablet 1  .  cholecalciferol (VITAMIN D) 1000 units tablet Take 2,000 Units by mouth daily.    . Coenzyme Q10 (CO Q 10 PO) Take 100 mg by mouth daily.    . famotidine (PEPCID) 20 MG tablet TAKE 1 TABLET BY MOUTH TWICE A DAY 180 tablet 1  . losartan (COZAAR) 25 MG tablet Take 25 mg by mouth daily.    Marland Kitchen omeprazole (PRILOSEC) 40 MG capsule Take 1 capsule (40 mg total) by mouth daily. 30 capsule 3  . OVER THE COUNTER MEDICATION Take 1 tablet by mouth daily. [facility] stool softener daily    . simvastatin (ZOCOR) 20 MG tablet TAKE 1 TABLET BY MOUTH EVERY DAY 90 tablet 1  . spironolactone (ALDACTONE) 25 MG tablet Take 1 tablet (25 mg total) by mouth daily. 90 tablet 3   No current facility-administered medications on file prior to visit.    Allergies  Allergen Reactions  . Gatifloxacin     unknown  . Levofloxacin     REACTION: rash  . Nitrofurantoin Macrocrystal     unknown  . Sulfamethoxazole-Trimethoprim Rash and Other (See Comments)    Chills, fever  . Sulfonamide Derivatives Rash and Other (See Comments)    Chills, fever   Socioeconomic History  . Marital status: Married  Spouse name: Not on file  . Number of children: 2  . Years of education: Not on file  . Highest education level: Not on file  Social Needs  . Financial resource strain: Not on file  . Food insecurity - worry: Not on file  . Food insecurity - inability: Not on file  . Transportation needs - medical: Not on file  . Transportation needs - non-medical: Not on file  Occupational History  . Occupation: retired  Tobacco Use  . Smoking status: Never Smoker  . Smokeless tobacco: Never Used  Substance and Sexual Activity  . Alcohol use: No  . Drug use: No  . Sexual activity: Yes  Other Topics Concern  . Not on file  Social History Narrative   Spouse:  [name]   Children:   [name]- age 73- [place]- age 48- [place]      Review of Systems  All other systems reviewed and are negative.        Objective:    Vital signs are reviewed and are normal.  Cardiovascular is regular rate and rhythm with no murmurs rubs or gallops.  Pulmonary exam is normal.  Abdomen is soft, nondistended, nontender.      Assessment & Plan:  PVCs, anxiety  Definitely believe she needs a monitor to rule out paroxysmal atrial fibrillation or SVT.  However her symptoms sound assistant with PVCs.  I will give the patient Cardizem 30 mg.  She can take 1 tablet every 6 hours as needed for prolonged PVCs.  Continue the atenolol as prescribed.  Use Xanax 0.25 mg p.o. every 8 hours as needed breakthrough anxiety

## 2018-11-24 ENCOUNTER — Ambulatory Visit: Payer: Medicare HMO | Admitting: Physician Assistant

## 2018-11-25 ENCOUNTER — Ambulatory Visit (INDEPENDENT_AMBULATORY_CARE_PROVIDER_SITE_OTHER): Payer: Medicare HMO

## 2018-11-25 DIAGNOSIS — I493 Ventricular premature depolarization: Secondary | ICD-10-CM | POA: Diagnosis not present

## 2018-12-03 ENCOUNTER — Other Ambulatory Visit: Payer: Self-pay | Admitting: Family Medicine

## 2018-12-07 ENCOUNTER — Other Ambulatory Visit: Payer: Self-pay | Admitting: Family Medicine

## 2018-12-07 DIAGNOSIS — C50911 Malignant neoplasm of unspecified site of right female breast: Secondary | ICD-10-CM | POA: Diagnosis not present

## 2018-12-08 ENCOUNTER — Other Ambulatory Visit: Payer: Self-pay | Admitting: Family Medicine

## 2018-12-08 MED ORDER — CEPHALEXIN 500 MG PO CAPS: 500.0000 mg | ORAL_CAPSULE | Freq: Every day | ORAL | 2 refills | Status: DC

## 2018-12-21 ENCOUNTER — Other Ambulatory Visit: Payer: Medicare HMO

## 2018-12-21 DIAGNOSIS — E785 Hyperlipidemia, unspecified: Secondary | ICD-10-CM

## 2018-12-21 DIAGNOSIS — I10 Essential (primary) hypertension: Secondary | ICD-10-CM

## 2018-12-21 LAB — COMPREHENSIVE METABOLIC PANEL WITH GFR
AG Ratio: 1.4 (calc) (ref 1.0–2.5)
ALT: 15 U/L (ref 6–29)
AST: 15 U/L (ref 10–35)
Albumin: 3.9 g/dL (ref 3.6–5.1)
Alkaline phosphatase (APISO): 74 U/L (ref 37–153)
BUN/Creatinine Ratio: 14 (calc) (ref 6–22)
BUN: 15 mg/dL (ref 7–25)
CO2: 29 mmol/L (ref 20–32)
Calcium: 9.6 mg/dL (ref 8.6–10.4)
Chloride: 107 mmol/L (ref 98–110)
Creat: 1.05 mg/dL — ABNORMAL HIGH (ref 0.60–0.93)
Globulin: 2.7 g/dL (ref 1.9–3.7)
Glucose, Bld: 100 mg/dL — ABNORMAL HIGH (ref 65–99)
Potassium: 5.3 mmol/L (ref 3.5–5.3)
Sodium: 146 mmol/L (ref 135–146)
Total Bilirubin: 0.6 mg/dL (ref 0.2–1.2)
Total Protein: 6.6 g/dL (ref 6.1–8.1)

## 2018-12-21 LAB — CBC WITH DIFFERENTIAL/PLATELET
Absolute Monocytes: 442 {cells}/uL (ref 200–950)
Basophils Absolute: 10 {cells}/uL (ref 0–200)
Basophils Relative: 0.2 %
Eosinophils Absolute: 52 {cells}/uL (ref 15–500)
Eosinophils Relative: 1 %
HCT: 42.2 % (ref 35.0–45.0)
Hemoglobin: 13.4 g/dL (ref 11.7–15.5)
Lymphs Abs: 1425 {cells}/uL (ref 850–3900)
MCH: 28.7 pg (ref 27.0–33.0)
MCHC: 31.8 g/dL — ABNORMAL LOW (ref 32.0–36.0)
MCV: 90.4 fL (ref 80.0–100.0)
MPV: 11.4 fL (ref 7.5–12.5)
Monocytes Relative: 8.5 %
Neutro Abs: 3271 {cells}/uL (ref 1500–7800)
Neutrophils Relative %: 62.9 %
Platelets: 212 10*3/uL (ref 140–400)
RBC: 4.67 Million/uL (ref 3.80–5.10)
RDW: 13.5 % (ref 11.0–15.0)
Total Lymphocyte: 27.4 %
WBC: 5.2 10*3/uL (ref 3.8–10.8)

## 2018-12-21 LAB — LIPID PANEL
Cholesterol: 143 mg/dL
HDL: 33 mg/dL — ABNORMAL LOW
LDL Cholesterol (Calc): 83 mg/dL
Non-HDL Cholesterol (Calc): 110 mg/dL
Total CHOL/HDL Ratio: 4.3 (calc)
Triglycerides: 174 mg/dL — ABNORMAL HIGH

## 2018-12-23 ENCOUNTER — Encounter: Payer: Self-pay | Admitting: Family Medicine

## 2019-02-22 ENCOUNTER — Other Ambulatory Visit: Payer: Self-pay | Admitting: Family Medicine

## 2019-05-16 ENCOUNTER — Other Ambulatory Visit: Payer: Self-pay | Admitting: Obstetrics and Gynecology

## 2019-05-16 DIAGNOSIS — Z1231 Encounter for screening mammogram for malignant neoplasm of breast: Secondary | ICD-10-CM

## 2019-05-21 ENCOUNTER — Other Ambulatory Visit: Payer: Self-pay | Admitting: Cardiovascular Disease

## 2019-06-03 ENCOUNTER — Other Ambulatory Visit: Payer: Self-pay | Admitting: Family Medicine

## 2019-06-21 ENCOUNTER — Other Ambulatory Visit: Payer: Self-pay

## 2019-06-21 ENCOUNTER — Ambulatory Visit (INDEPENDENT_AMBULATORY_CARE_PROVIDER_SITE_OTHER): Payer: Medicare HMO

## 2019-06-21 DIAGNOSIS — Z23 Encounter for immunization: Secondary | ICD-10-CM

## 2019-06-27 ENCOUNTER — Encounter: Payer: Self-pay | Admitting: Cardiovascular Disease

## 2019-06-27 ENCOUNTER — Other Ambulatory Visit: Payer: Self-pay

## 2019-06-27 ENCOUNTER — Ambulatory Visit (INDEPENDENT_AMBULATORY_CARE_PROVIDER_SITE_OTHER): Payer: Medicare HMO | Admitting: Cardiovascular Disease

## 2019-06-27 VITALS — BP 120/76 | HR 63 | Ht 66.0 in | Wt 196.8 lb

## 2019-06-27 DIAGNOSIS — I10 Essential (primary) hypertension: Secondary | ICD-10-CM

## 2019-06-27 DIAGNOSIS — I493 Ventricular premature depolarization: Secondary | ICD-10-CM

## 2019-06-27 MED ORDER — DILTIAZEM HCL 30 MG PO TABS: 30.0000 mg | ORAL_TABLET | Freq: Four times a day (QID) | ORAL | 6 refills | Status: DC | PRN

## 2019-06-27 MED ORDER — LOSARTAN POTASSIUM 25 MG PO TABS: 25.0000 mg | ORAL_TABLET | Freq: Every day | ORAL | 3 refills | Status: DC

## 2019-06-27 MED ORDER — ROSUVASTATIN CALCIUM 5 MG PO TABS: 5.0000 mg | ORAL_TABLET | Freq: Every day | ORAL | 3 refills | Status: DC

## 2019-06-27 MED ORDER — SPIRONOLACTONE 25 MG PO TABS: 25.0000 mg | ORAL_TABLET | Freq: Every day | ORAL | 3 refills | Status: DC

## 2019-06-27 NOTE — Patient Instructions (Signed)
Medication Instructions:  Your physician has recommended you make the following change in your medication:  STOP Simvastatin (Zocor) START Rosuvastatin (Crestor) 5 mg once daily  Refills of your medications have been sent to your pharmacy  Lab work: None Ordered    Testing/Procedures: None Ordered   Follow-Up: At [facility], you and your health needs are our priority.  As part of our continuing mission to provide you with exceptional heart care, we have created designated Provider Care Teams.  These Care Teams include your primary Cardiologist (physician) and Advanced Practice Providers (APPs -  Physician Assistants and Nurse Practitioners) who all work together to provide you with the care you need, when you need it. You will need a follow up appointment in:  1 years.  Please call our office 2 months in advance to schedule this appointment.  You may see Dr. Acie Fredrickson or one of the following Advanced Practice Providers on your designated Care Team: Richardson Dopp, PA-C [place], [place] . Daune Perch, NP

## 2019-06-27 NOTE — Progress Notes (Signed)
Cardiology Office Note   Date:  06/27/2019   ID:  Alexandra Calderon, Alexandra Calderon 11/22/1940, MRN [id number]  PCP:  Alexandra Frizzle, MD  Cardiologist:   Alexandra Moores, MD   Chief Complaint  Patient presents with  . Palpitations   Problem List 1. Essential HTN 2. Palpitations - PVCs  3. Hyperlipidemia  4. Irritable bowel syndrome     Previous notes.   Alexandra Calderon is a 78 y.o. female who is being seen today for the evaluation of palpitations  at the request of Alexandra Frizzle, MD.  Seen with her husband   Alexandra Calderon  Has had palpitations all of her life.    Over the years, they have been stable for years.  Dr. Raliegh Calderon increased her atenolol which seemed to have helped.   Has rare , isolated "thumps"  And then normal HR for a while These episodes may go on for several days Seem to be worse after eating , in the evening .  Do not limit her activity .  Not associated with weakness or dizziness, no dyspnea.    Does not get any regular exercise - tries to walk some   Aug. 20, 2018  Alexandra Calderon is here for follow up of her HTN and PVCs  Heart is now "quiet." Exercising some ,  No CP or dyspnea.   Aug. 27, 2019:  Feeling better.   Not as many PVCs now  Takes atenolol 50 mg BID  Discussed weight loss   Sept. 14, 2020  Alexandra Calderon is seen for further evaluation of her palpitations No further palpitations. Walks on occasion.   Gets DOE walking up a hill.  Likely due to deconditioning .   Has several questions today .  Was seen by her primary for palpitations.   Dr. Dennard Calderon gave her Diltiazem 30 mg to take as needed.   She is still on atenolol 50 mg BID       Past Medical History:  Diagnosis Date  . Allergy   . Arthritis   . Breast cancer ([place])    right  . Colon adenomas [id number]  . Constipation   . Diverticulosis   . Esophagitis   . GERD (gastroesophageal reflux disease)   . Hearing loss   . Hyperlipidemia   . Hypertension   . Irritable bowel syndrome 08/27/2012  . Kidney  stones   . Osteopenia   . Palpitations   . Personal history of radiation therapy   . Pneumonia   . Polyp, stomach   . PVC (premature ventricular contraction)   . UTI (lower urinary tract infection)     Past Surgical History:  Procedure Laterality Date  . BREAST LUMPECTOMY Right 07/01/11   w/sentinal node bx - right side  . COLONOSCOPY W/ BIOPSIES    . DILATION AND CURETTAGE OF UTERUS    . ESOPHAGOGASTRODUODENOSCOPY    . kidney stones    . shoulder arthroscopy rt 2009    . SHOULDER SURGERY       Current Outpatient Medications  Medication Sig Dispense Refill  . alendronate (FOSAMAX) 70 MG tablet TAKE 1 TABLET BY MOUTH EVERY 7 DAYS WITH A FULL GLASS OF WATER ON AN EMPTY STOMACH. 12 tablet 1  . ALPRAZolam (XANAX) 0.25 MG tablet Take 1 tablet (0.25 mg total) by mouth 3 (three) times daily as needed for anxiety. 30 tablet 0  . atenolol (TENORMIN) 50 MG tablet TAKE 1 TABLET BY MOUTH TWICE A DAY 180 tablet 3  . cephALEXin (  KEFLEX) 500 MG capsule Take 1 capsule (500 mg total) by mouth daily. After intercourse x 1 then if infection bid x 5 day 30 capsule 2  . cholecalciferol (VITAMIN D) 1000 units tablet Take 2,000 Units by mouth daily.    . Coenzyme Q10 (CO Q 10 PO) Take 100 mg by mouth daily.    Marland Kitchen diltiazem (CARDIZEM) 30 MG tablet Take 1 tablet (30 mg total) by mouth 4 (four) times daily as needed (palpitations). 30 tablet 0  . losartan (COZAAR) 25 MG tablet TAKE 1 TABLET BY MOUTH EVERY DAY 90 tablet 0  . omeprazole (PRILOSEC) 40 MG capsule TAKE 1 CAPSULE BY MOUTH EVERY DAY 90 capsule 3  . OVER THE COUNTER MEDICATION Take 1 tablet by mouth daily. [facility] stool softener daily    . simvastatin (ZOCOR) 20 MG tablet TAKE 1 TABLET BY MOUTH EVERY DAY 90 tablet 3  . spironolactone (ALDACTONE) 25 MG tablet TAKE 1 TABLET BY MOUTH EVERY DAY 90 tablet 0   No current facility-administered medications for this visit.     PAD Screen 02/10/2017  Previous PAD dx? No  Previous surgical procedure? No   Pain with walking? No  Feet/toe relief with dangling? No  Painful, non-healing ulcers? No  Extremities discolored? No      Allergies:   Gatifloxacin, Nitrofurantoin macrocrystal, Azithromycin, Levofloxacin, Sulfamethoxazole-trimethoprim, and Sulfonamide derivatives    Social History:  The patient  reports that she has never smoked. She has never used smokeless tobacco. She reports that she does not drink alcohol or use drugs.   Family History:  The patient's family history includes Cancer in her father; Colon cancer in her mother; Diabetes in her maternal aunt and paternal grandmother; Hodgkin's lymphoma in her father; Lymphoma in her father.    ROS:     Noted in current hx , otherwise negative    Physical Exam: Blood pressure 120/76, pulse 63, height 5\' 6"  (1.676 m), weight 196 lb 12.8 oz (89.3 kg), SpO2 98 %.  GEN:  Middle age female,  HEENT: Normal NECK: No JVD; No carotid bruits LYMPHATICS: No lymphadenopathy CARDIAC: RRR , no murmurs, rubs, gallops RESPIRATORY:  Clear to auscultation without rales, wheezing or rhonchi  ABDOMEN: Soft, non-tender, non-distended MUSCULOSKELETAL:  No edema; No deformity  SKIN: Warm and dry NEUROLOGIC:  Alert and oriented x 3   EKG:    Sept. 14, 2020:   NSR at 29.  Inc. RBBB,  LAFB,   Recent Labs: 12/21/2018: ALT 15; BUN 15; Creat 1.05; Hemoglobin 13.4; Platelets 212; Potassium 5.3; Sodium 146    Lipid Panel    Component Value Date/Time   CHOL 143 12/21/2018 0809   TRIG 174 (H) 12/21/2018 0809   HDL 33 (L) 12/21/2018 0809   CHOLHDL 4.3 12/21/2018 0809   VLDL 24.2 04/29/2017 0800   LDLCALC 83 12/21/2018 0809      Wt Readings from Last 3 Encounters:  06/27/19 196 lb 12.8 oz (89.3 kg)  11/11/18 197 lb (89.4 kg)  07/27/18 197 lb (89.4 kg)      Other studies Reviewed: Additional studies/ records that were reviewed today include: . Review of the above records demonstrates:    ASSESSMENT AND PLAN:  1.  Palpitations -   Has  an apple watch , series 4.   Will record if she has any significant palpitations.   She is concerned about having atrial fibrillation.  I reassured her that the apple watch was specifically designed to look for any signs of atrial  fibrillation.  I encouraged her to take as many recordings as she can if she is feeling like her heart rate is irregular.  She can send those to [place] via the MyChart app.   2. Hypertension:  BP is well controlled.   She still eating some salty foods on occasion.  I encouraged her to watch her diet.  3.  Hyperlipidemia:  Her diltiazem interacts with her Simvastatin.   Will DC Simvastatin and start her on Rosuvastatin 5 mg .  Her labs will be checked by her primary medical doctor.  She still admits to eating occasional sweets and carbohydrates.   Current medicines are reviewed at length with the patient today.  The patient does not have concerns regarding medicines.  Labs/ tests ordered today include:  No orders of the defined types were placed in this encounter.    Disposition:   FU with me in 1 year       Alexandra Moores, MD  06/27/2019 9:51 AM    [facility] [address], [place], [place]  [address] Phone: [phone]; Fax: [phone]

## 2019-07-04 ENCOUNTER — Other Ambulatory Visit: Payer: Self-pay

## 2019-07-04 ENCOUNTER — Ambulatory Visit
Admission: RE | Admit: 2019-07-04 | Discharge: 2019-07-04 | Disposition: A | Discharge status: Home / Self Care | Payer: Medicare HMO | Source: Ambulatory Visit | Attending: Obstetrics and Gynecology | Admitting: Obstetrics and Gynecology

## 2019-07-04 DIAGNOSIS — Z1231 Encounter for screening mammogram for malignant neoplasm of breast: Secondary | ICD-10-CM

## 2019-07-18 ENCOUNTER — Other Ambulatory Visit: Payer: Medicare HMO

## 2019-07-18 ENCOUNTER — Other Ambulatory Visit: Payer: Self-pay

## 2019-07-18 DIAGNOSIS — Z0000 Encounter for general adult medical examination without abnormal findings: Secondary | ICD-10-CM | POA: Diagnosis not present

## 2019-07-19 LAB — COMPREHENSIVE METABOLIC PANEL WITH GFR
AG Ratio: 1.3 (calc) (ref 1.0–2.5)
ALT: 13 U/L (ref 6–29)
AST: 16 U/L (ref 10–35)
Albumin: 3.9 g/dL (ref 3.6–5.1)
Alkaline phosphatase (APISO): 71 U/L (ref 37–153)
BUN/Creatinine Ratio: 15 (calc) (ref 6–22)
BUN: 15 mg/dL (ref 7–25)
CO2: 29 mmol/L (ref 20–32)
Calcium: 9 mg/dL (ref 8.6–10.4)
Chloride: 105 mmol/L (ref 98–110)
Creat: 1 mg/dL — ABNORMAL HIGH (ref 0.60–0.93)
Globulin: 2.9 g/dL (ref 1.9–3.7)
Glucose, Bld: 96 mg/dL (ref 65–99)
Potassium: 4.7 mmol/L (ref 3.5–5.3)
Sodium: 142 mmol/L (ref 135–146)
Total Bilirubin: 0.6 mg/dL (ref 0.2–1.2)
Total Protein: 6.8 g/dL (ref 6.1–8.1)

## 2019-07-19 LAB — LIPID PANEL
Cholesterol: 132 mg/dL
HDL: 37 mg/dL — ABNORMAL LOW
LDL Cholesterol (Calc): 75 mg/dL
Non-HDL Cholesterol (Calc): 95 mg/dL
Total CHOL/HDL Ratio: 3.6 (calc)
Triglycerides: 118 mg/dL

## 2019-07-19 LAB — CBC WITH DIFFERENTIAL/PLATELET
Absolute Monocytes: 466 {cells}/uL (ref 200–950)
Basophils Absolute: 21 {cells}/uL (ref 0–200)
Basophils Relative: 0.4 %
Eosinophils Absolute: 48 {cells}/uL (ref 15–500)
Eosinophils Relative: 0.9 %
HCT: 41.9 % (ref 35.0–45.0)
Hemoglobin: 13.7 g/dL (ref 11.7–15.5)
Lymphs Abs: 1431 {cells}/uL (ref 850–3900)
MCH: 29.8 pg (ref 27.0–33.0)
MCHC: 32.7 g/dL (ref 32.0–36.0)
MCV: 91.1 fL (ref 80.0–100.0)
MPV: 11.3 fL (ref 7.5–12.5)
Monocytes Relative: 8.8 %
Neutro Abs: 3334 {cells}/uL (ref 1500–7800)
Neutrophils Relative %: 62.9 %
Platelets: 189 10*3/uL (ref 140–400)
RBC: 4.6 Million/uL (ref 3.80–5.10)
RDW: 13.9 % (ref 11.0–15.0)
Total Lymphocyte: 27 %
WBC: 5.3 10*3/uL (ref 3.8–10.8)

## 2019-07-21 ENCOUNTER — Other Ambulatory Visit: Payer: Self-pay

## 2019-07-21 ENCOUNTER — Ambulatory Visit (INDEPENDENT_AMBULATORY_CARE_PROVIDER_SITE_OTHER): Payer: Medicare HMO | Admitting: Family Medicine

## 2019-07-21 ENCOUNTER — Encounter: Payer: Self-pay | Admitting: Family Medicine

## 2019-07-21 ENCOUNTER — Other Ambulatory Visit: Payer: Self-pay | Admitting: Family Medicine

## 2019-07-21 VITALS — BP 106/60 | HR 62 | Temp 97.9°F | Resp 14 | Ht 67.0 in | Wt 196.0 lb

## 2019-07-21 DIAGNOSIS — M85851 Other specified disorders of bone density and structure, right thigh: Secondary | ICD-10-CM

## 2019-07-21 DIAGNOSIS — I10 Essential (primary) hypertension: Secondary | ICD-10-CM

## 2019-07-21 DIAGNOSIS — E785 Hyperlipidemia, unspecified: Secondary | ICD-10-CM

## 2019-07-21 DIAGNOSIS — Z0000 Encounter for general adult medical examination without abnormal findings: Secondary | ICD-10-CM | POA: Diagnosis not present

## 2019-07-21 NOTE — Progress Notes (Signed)
Subjective:    Patient ID: Alexandra Calderon, female    DOB: 08/14/1941, 78 y.o.   MRN: [id number]  HPI Patient is a very pleasant 78 year old white female who has a past medical history significant for breast cancer.  This was treated with lumpectomy and postoperative hormone therapy according to the patient although she did not take tamoxifen.  Her last mammogram was September of this year and was completely normal.  She is here today for a complete physical exam.  Her last colonoscopy was in 2016 and is good until 2021.  Her last bone density test was performed in 2019 and showed a T score of -2.3 at the right femoral neck.  I have recommended calcium 1200 mg a day as well as vitamin D 1000 units a day.  She also has a history of hypertension, GERD, hyperlipidemia/dyslipidemia, irritable bowel syndrome.  Most recent immunizations are listed below: Immunization History  Administered Date(s) Administered  . DTP 05/24/2007  . Fluad Quad(high Dose 65+) 06/21/2019  . Influenza Split 06/29/2012  . Influenza Whole 10/14/1999, 07/29/2007, 07/28/2008, 07/05/2009, 07/26/2010  . Influenza, High Dose Seasonal PF 07/25/2014, 08/02/2015, 06/25/2016, 06/16/2017  . Influenza,inj,Quad PF,6+ Mos 07/05/2013, 06/21/2018  . Pneumococcal Conjugate-13 08/15/2013  . Pneumococcal Polysaccharide-23 10/13/2002, 04/19/2015  . Td 10/13/2006, 05/24/2007  . Zoster 08/05/2016   Most recent lab work is listed below: Lab on 07/18/2019  Component Date Value Ref Range Status  . WBC 07/18/2019 5.3  3.8 - 10.8 Thousand/uL Final  . RBC 07/18/2019 4.60  3.80 - 5.10 Million/uL Final  . Hemoglobin 07/18/2019 13.7  11.7 - 15.5 g/dL Final  . HCT 07/18/2019 41.9  35.0 - 45.0 % Final  . MCV 07/18/2019 91.1  80.0 - 100.0 fL Final  . MCH 07/18/2019 29.8  27.0 - 33.0 pg Final  . MCHC 07/18/2019 32.7  32.0 - 36.0 g/dL Final  . RDW 07/18/2019 13.9  11.0 - 15.0 % Final  . Platelets 07/18/2019 189  140 - 400 Thousand/uL Final  . MPV  07/18/2019 11.3  7.5 - 12.5 fL Final  . Neutro Abs 07/18/2019 3,334  1,500 - 7,800 cells/uL Final  . Lymphs Abs 07/18/2019 1,431  850 - 3,900 cells/uL Final  . Absolute Monocytes 07/18/2019 466  200 - 950 cells/uL Final  . Eosinophils Absolute 07/18/2019 48  15 - 500 cells/uL Final  . Basophils Absolute 07/18/2019 21  0 - 200 cells/uL Final  . Neutrophils Relative % 07/18/2019 62.9  % Final  . Total Lymphocyte 07/18/2019 27.0  % Final  . Monocytes Relative 07/18/2019 8.8  % Final  . Eosinophils Relative 07/18/2019 0.9  % Final  . Basophils Relative 07/18/2019 0.4  % Final  . Glucose, Bld 07/18/2019 96  65 - 99 mg/dL Final   Comment: .            Fasting reference interval .   . BUN 07/18/2019 15  7 - 25 mg/dL Final  . Creat 07/18/2019 1.00* 0.60 - 0.93 mg/dL Final   Comment: For patients >65 years of age, the reference limit for Creatinine is approximately 13% higher for people identified as African-American. .   Havery Moros Ratio 07/18/2019 15  6 - 22 (calc) Final  . Sodium 07/18/2019 142  135 - 146 mmol/L Final  . Potassium 07/18/2019 4.7  3.5 - 5.3 mmol/L Final  . Chloride 07/18/2019 105  98 - 110 mmol/L Final  . CO2 07/18/2019 29  20 - 32 mmol/L Final  . Calcium 07/18/2019  9.0  8.6 - 10.4 mg/dL Final  . Total Protein 07/18/2019 6.8  6.1 - 8.1 g/dL Final  . Albumin 07/18/2019 3.9  3.6 - 5.1 g/dL Final  . Globulin 07/18/2019 2.9  1.9 - 3.7 g/dL (calc) Final  . AG Ratio 07/18/2019 1.3  1.0 - 2.5 (calc) Final  . Total Bilirubin 07/18/2019 0.6  0.2 - 1.2 mg/dL Final  . Alkaline phosphatase (APISO) 07/18/2019 71  37 - 153 U/L Final  . AST 07/18/2019 16  10 - 35 U/L Final  . ALT 07/18/2019 13  6 - 29 U/L Final  . Cholesterol 07/18/2019 132  <200 mg/dL Final  . HDL 07/18/2019 37* > OR = 50 mg/dL Final  . Triglycerides 07/18/2019 118  <150 mg/dL Final  . LDL Cholesterol (Calc) 07/18/2019 75  mg/dL (calc) Final   Comment: Reference range: <100 . Desirable range <100 mg/dL  for primary prevention;   <70 mg/dL for patients with CHD or diabetic patients  with > or = 2 CHD risk factors. Marland Kitchen LDL-C is now calculated using the Martin-Hopkins  calculation, which is a validated novel method providing  better accuracy than the Friedewald equation in the  estimation of LDL-C.  Cresenciano Genre et al. Annamaria Helling. [id number]): 2061-2068  (http://education.[employer].com/faq/FAQ164)   . Total CHOL/HDL Ratio 07/18/2019 3.6  <5.0 (calc) Final  . Non-HDL Cholesterol (Calc) 07/18/2019 95  <130 mg/dL (calc) Final   Comment: For patients with diabetes plus 1 major ASCVD risk  factor, treating to a non-HDL-C goal of <100 mg/dL  (LDL-C of <70 mg/dL) is considered a therapeutic  option.     Past Medical History:  Diagnosis Date  . Allergy   . Arthritis   . Breast cancer ([place])    right  . Colon adenomas [id number]  . Constipation   . Diverticulosis   . Esophagitis   . GERD (gastroesophageal reflux disease)   . Hearing loss   . Hyperlipidemia   . Hypertension   . Irritable bowel syndrome 08/27/2012  . Kidney stones   . Osteopenia   . Palpitations   . Personal history of radiation therapy   . Pneumonia   . Polyp, stomach   . PVC (premature ventricular contraction)   . UTI (lower urinary tract infection)    Past Surgical History:  Procedure Laterality Date  . BREAST LUMPECTOMY Right 07/01/11   w/sentinal node bx - right side  . COLONOSCOPY W/ BIOPSIES    . DILATION AND CURETTAGE OF UTERUS    . ESOPHAGOGASTRODUODENOSCOPY    . kidney stones    . shoulder arthroscopy rt 2009    . SHOULDER SURGERY      Current Outpatient Medications on File Prior to Visit  Medication Sig Dispense Refill  . alendronate (FOSAMAX) 70 MG tablet TAKE 1 TABLET BY MOUTH EVERY 7 DAYS WITH A FULL GLASS OF WATER ON AN EMPTY STOMACH. 12 tablet 1  . ALPRAZolam (XANAX) 0.25 MG tablet Take 1 tablet (0.25 mg total) by mouth 3 (three) times daily as needed for anxiety. 30 tablet 0  . atenolol  (TENORMIN) 50 MG tablet TAKE 1 TABLET BY MOUTH TWICE A DAY 180 tablet 3  . cephALEXin (KEFLEX) 500 MG capsule Take 1 capsule (500 mg total) by mouth daily. After intercourse x 1 then if infection bid x 5 day 30 capsule 2  . cholecalciferol (VITAMIN D) 1000 units tablet Take 2,000 Units by mouth daily.    . Coenzyme Q10 (CO Q 10 PO) Take 100 mg by mouth  daily.    . diltiazem (CARDIZEM) 30 MG tablet Take 1 tablet (30 mg total) by mouth 4 (four) times daily as needed (palpitations). 60 tablet 6  . losartan (COZAAR) 25 MG tablet Take 1 tablet (25 mg total) by mouth daily. 90 tablet 3  . omeprazole (PRILOSEC) 40 MG capsule TAKE 1 CAPSULE BY MOUTH EVERY DAY 90 capsule 3  . OVER THE COUNTER MEDICATION Take 1 tablet by mouth daily. [facility] stool softener daily    . rosuvastatin (CRESTOR) 5 MG tablet Take 1 tablet (5 mg total) by mouth daily. 90 tablet 3  . spironolactone (ALDACTONE) 25 MG tablet Take 1 tablet (25 mg total) by mouth daily. 90 tablet 3   No current facility-administered medications on file prior to visit.    Allergies  Allergen Reactions  . Gatifloxacin     unknown  . Nitrofurantoin Macrocrystal     unknown  . Azithromycin Rash  . Levofloxacin Rash    REACTION: rash REACTION: rash  . Sulfamethoxazole-Trimethoprim Rash and Other (See Comments)    Chills, fever  . Sulfonamide Derivatives Rash and Other (See Comments)    Chills, fever   Social History   Socioeconomic History  . Marital status: Married    Spouse name: Not on file  . Number of children: 2  . Years of education: Not on file  . Highest education level: Not on file  Occupational History  . Occupation: retired  Scientific laboratory technician  . Financial resource strain: Not on file  . Food insecurity    Worry: Not on file    Inability: Not on file  . Transportation needs    Medical: Not on file    Non-medical: Not on file  Tobacco Use  . Smoking status: Never Smoker  . Smokeless tobacco: Never Used  Substance and  Sexual Activity  . Alcohol use: No  . Drug use: No  . Sexual activity: Yes  Lifestyle  . Physical activity    Days per week: Not on file    Minutes per session: Not on file  . Stress: Not on file  Relationships  . Social Herbalist on phone: Not on file    Gets together: Not on file    Attends religious service: Not on file    Active member of club or organization: Not on file    Attends meetings of clubs or organizations: Not on file    Relationship status: Not on file  . Intimate partner violence    Fear of current or ex partner: Not on file    Emotionally abused: Not on file    Physically abused: Not on file    Forced sexual activity: Not on file  Other Topics Concern  . Not on file  Social History Narrative   Spouse:  [name]   Children:   [name]- age 64- [place]- age 66- [place]   Family History  Problem Relation Age of Onset  . Lymphoma Father   . Cancer Father   . Hodgkin's lymphoma Father   . Colon cancer Mother   . Diabetes Maternal Aunt   . Diabetes Paternal Grandmother   . Colon polyps Neg Hx   . Rectal cancer Neg Hx   . Stomach cancer Neg Hx      Review of Systems  All other systems reviewed and are negative.      Objective:   Physical Exam  Constitutional: She is oriented to  person, place, and time. She appears well-developed and well-nourished. No distress.  HENT:  Head: Normocephalic and atraumatic.  Right Ear: External ear normal.  Left Ear: External ear normal.  Nose: Nose normal.  Mouth/Throat: Oropharynx is clear and moist.  Eyes: Pupils are equal, round, and reactive to light. Conjunctivae and EOM are normal. Right eye exhibits no discharge. Left eye exhibits no discharge. No scleral icterus.  Neck: Normal range of motion. Neck supple. No JVD present. No tracheal deviation present. No thyromegaly present.  Cardiovascular: Normal rate, regular rhythm, normal heart sounds and intact distal pulses. Exam  reveals no gallop and no friction rub.  No murmur heard. Pulmonary/Chest: Effort normal and breath sounds normal. No stridor. No respiratory distress. She has no wheezes. She has no rales. She exhibits no tenderness.  Abdominal: Soft. Bowel sounds are normal. She exhibits no distension and no mass. There is no abdominal tenderness. There is no rebound and no guarding.  Musculoskeletal: Normal range of motion.        General: No tenderness, deformity or edema.  Lymphadenopathy:    She has no cervical adenopathy.  Neurological: She is alert and oriented to person, place, and time. She has normal reflexes. No cranial nerve deficit. She exhibits normal muscle tone. Coordination normal.  Skin: Skin is warm. No rash noted. She is not diaphoretic. No erythema. No pallor.  Psychiatric: She has a normal mood and affect. Her behavior is normal. Judgment and thought content normal.  Vitals reviewed.         Assessment & Plan:  Medicare annual wellness visit, subsequent  Essential hypertension  Dyslipidemia  Osteopenia of neck of right femur   Patient's exam is completely normal.  We discussed her osteopenia.  Patient is taking Fosamax in addition to calcium and vitamin D.  She is due to recheck her bone density test next year.  Her mammogram is up-to-date.  Her colonoscopy is up-to-date.  All of her immunizations are up-to-date.  She denies any issues with falls or depression.  Her lab work is outstanding.  I did recommend that she discontinue spironolactone.  Patient's blood pressure slightly low today and she does report occasional dizziness and I do not see a strong convincing indication to keep her on a diuretic.  Therefore she will discontinue the spironolactone and monitor for any issues.

## 2019-08-19 ENCOUNTER — Other Ambulatory Visit: Payer: Self-pay | Admitting: Family Medicine

## 2019-08-24 DIAGNOSIS — L570 Actinic keratosis: Secondary | ICD-10-CM | POA: Diagnosis not present

## 2019-08-24 DIAGNOSIS — X32XXXA Exposure to sunlight, initial encounter: Secondary | ICD-10-CM | POA: Diagnosis not present

## 2019-08-24 DIAGNOSIS — B078 Other viral warts: Secondary | ICD-10-CM | POA: Diagnosis not present

## 2019-08-24 DIAGNOSIS — L820 Inflamed seborrheic keratosis: Secondary | ICD-10-CM | POA: Diagnosis not present

## 2019-08-24 DIAGNOSIS — L218 Other seborrheic dermatitis: Secondary | ICD-10-CM | POA: Diagnosis not present

## 2019-08-26 ENCOUNTER — Telehealth: Payer: Self-pay | Admitting: Family Medicine

## 2019-08-26 NOTE — Telephone Encounter (Signed)
Patient aware of results and will call back with readings  Throughout the day it has been 173/83,143/71,145/71,152/74

## 2019-08-26 NOTE — Telephone Encounter (Signed)
Pt called and states that after she woke up this morning she checked her bp and it was 183/89 and it has never been that high before and wanted to know if she should do anything for it?

## 2019-08-26 NOTE — Telephone Encounter (Signed)
Patient could take an extra losartan 25 mg today and recheck her blood pressure later this afternoon

## 2019-08-29 DIAGNOSIS — C50911 Malignant neoplasm of unspecified site of right female breast: Secondary | ICD-10-CM | POA: Diagnosis not present

## 2019-09-02 ENCOUNTER — Encounter: Payer: Self-pay | Admitting: Family Medicine

## 2019-09-14 ENCOUNTER — Other Ambulatory Visit: Payer: Self-pay

## 2019-09-15 ENCOUNTER — Encounter: Payer: Self-pay | Admitting: Family Medicine

## 2019-09-15 ENCOUNTER — Ambulatory Visit (INDEPENDENT_AMBULATORY_CARE_PROVIDER_SITE_OTHER): Payer: Medicare HMO | Admitting: Family Medicine

## 2019-09-15 VITALS — BP 136/74 | HR 58 | Temp 96.8°F | Resp 12 | Ht 67.0 in | Wt 195.0 lb

## 2019-09-15 DIAGNOSIS — I10 Essential (primary) hypertension: Secondary | ICD-10-CM

## 2019-09-15 NOTE — Progress Notes (Signed)
Subjective:    Patient ID: Alexandra Calderon, female    DOB: 1941/01/18, 78 y.o.   MRN: [id number]  HPI The patient's last office visit, we discontinued spironolactone and increased her losartan to 2 tablets a day due to elevated potassium as well as episodic low blood pressure.  Since holding the spironolactone however her blood pressure has risen substantially.  Her average blood pressure is between [id number] and 0000000 systolic over 0000000 diastolic.  The highest has been a [id number] systolic.  She denies any chest pain or shortness of breath or dyspnea on exertion however her blood pressure has risen substantially more than I would expect from someone taking just 12.5 mg of spironolactone.  This raises the possibility of Conn syndrome/hyperaldosteronism. Past Medical History:  Diagnosis Date   Allergy    Arthritis    Breast cancer ([place])    right   Colon adenomas 2003,2006   Constipation    Diverticulosis    Esophagitis    GERD (gastroesophageal reflux disease)    Hearing loss    Hyperlipidemia    Hypertension    Irritable bowel syndrome 08/27/2012   Kidney stones    Osteopenia    Palpitations    Personal history of radiation therapy    Pneumonia    Polyp, stomach    PVC (premature ventricular contraction)    UTI (lower urinary tract infection)    Past Surgical History:  Procedure Laterality Date   BREAST LUMPECTOMY Right 07/01/11   w/sentinal node bx - right side   COLONOSCOPY W/ BIOPSIES     DILATION AND CURETTAGE OF UTERUS     ESOPHAGOGASTRODUODENOSCOPY     kidney stones     shoulder arthroscopy rt 2009     SHOULDER SURGERY     Current Outpatient Medications on File Prior to Visit  Medication Sig Dispense Refill   alendronate (FOSAMAX) 70 MG tablet TAKE 1 TABLET BY MOUTH EVERY 7 DAYS WITH A FULL GLASS OF WATER ON AN EMPTY STOMACH. 12 tablet 3   ALPRAZolam (XANAX) 0.25 MG tablet Take 1 tablet (0.25 mg total) by mouth 3 (three) times daily as needed for  anxiety. 30 tablet 0   atenolol (TENORMIN) 50 MG tablet TAKE 1 TABLET BY MOUTH TWICE A DAY 180 tablet 3   cephALEXin (KEFLEX) 500 MG capsule Take 1 capsule (500 mg total) by mouth daily. After intercourse x 1 then if infection bid x 5 day 30 capsule 2   cholecalciferol (VITAMIN D) 1000 units tablet Take 2,000 Units by mouth daily.     Coenzyme Q10 (CO Q 10 PO) Take 100 mg by mouth daily.     diltiazem (CARDIZEM) 30 MG tablet Take 1 tablet (30 mg total) by mouth 4 (four) times daily as needed (palpitations). 60 tablet 6   losartan (COZAAR) 25 MG tablet Take 1 tablet (25 mg total) by mouth daily. (Patient taking differently: Take 50 mg by mouth daily. ) 90 tablet 3   omeprazole (PRILOSEC) 40 MG capsule TAKE 1 CAPSULE BY MOUTH EVERY DAY 90 capsule 3   OVER THE COUNTER MEDICATION Take 1 tablet by mouth daily. [facility] stool softener daily     rosuvastatin (CRESTOR) 5 MG tablet Take 1 tablet (5 mg total) by mouth daily. 90 tablet 3   No current facility-administered medications on file prior to visit.    Allergies  Allergen Reactions   Gatifloxacin     unknown   Nitrofurantoin Macrocrystal     unknown   Azithromycin  Rash   Levofloxacin Rash    REACTION: rash REACTION: rash   Sulfamethoxazole-Trimethoprim Rash and Other (See Comments)    Chills, fever   Sulfonamide Derivatives Rash and Other (See Comments)    Chills, fever   Social History   Socioeconomic History   Marital status: Married    Spouse name: Not on file   Number of children: 2   Years of education: Not on file   Highest education level: Not on file  Occupational History   Occupation: retired  Scientist, product/process development strain: Not on file   Food insecurity    Worry: Not on file    Inability: Not on Lexicographer needs    Medical: Not on file    Non-medical: Not on file  Tobacco Use   Smoking status: Never Smoker   Smokeless tobacco: Never Used  Substance and Sexual  Activity   Alcohol use: No   Drug use: No   Sexual activity: Yes  Lifestyle   Physical activity    Days per week: Not on file    Minutes per session: Not on file   Stress: Not on file  Relationships   Social connections    Talks on phone: Not on file    Gets together: Not on file    Attends religious service: Not on file    Active member of club or organization: Not on file    Attends meetings of clubs or organizations: Not on file    Relationship status: Not on file   Intimate partner violence    Fear of current or ex partner: Not on file    Emotionally abused: Not on file    Physically abused: Not on file    Forced sexual activity: Not on file  Other Topics Concern   Not on file  Social History Narrative   Spouse:  [name]   Children:   [name]- age 53- [place]- age 69- [place]      Review of Systems  All other systems reviewed and are negative.      Objective:   Physical Exam Vitals signs reviewed.  Constitutional:      Appearance: Normal appearance.  Cardiovascular:     Rate and Rhythm: Normal rate and regular rhythm.     Heart sounds: Normal heart sounds. No murmur.  Pulmonary:     Effort: Pulmonary effort is normal. No respiratory distress.     Breath sounds: Normal breath sounds. No stridor. No wheezing or rhonchi.  Musculoskeletal:     Right lower leg: No edema.     Left lower leg: No edema.  Neurological:     Mental Status: She is alert.           Assessment & Plan:  Essential hypertension  Patient's blood pressure has risen substantially since discontinuing spironolactone.  I question if she may have Conn syndrome.  Therefore we will resume spironolactone 12.5 mg daily.  Reduce her losartan dose back to 25 mg daily.  Patient will monitor her blood pressure closely at home.  If her blood pressure plummets we may need to discontinue spironolactone.  Monitor renal function and potassium every 6 months.

## 2019-09-19 DIAGNOSIS — K5900 Constipation, unspecified: Secondary | ICD-10-CM | POA: Diagnosis not present

## 2019-09-19 DIAGNOSIS — Z7983 Long term (current) use of bisphosphonates: Secondary | ICD-10-CM | POA: Diagnosis not present

## 2019-09-19 DIAGNOSIS — M810 Age-related osteoporosis without current pathological fracture: Secondary | ICD-10-CM | POA: Diagnosis not present

## 2019-09-19 DIAGNOSIS — R32 Unspecified urinary incontinence: Secondary | ICD-10-CM | POA: Diagnosis not present

## 2019-09-19 DIAGNOSIS — N390 Urinary tract infection, site not specified: Secondary | ICD-10-CM | POA: Diagnosis not present

## 2019-09-19 DIAGNOSIS — I10 Essential (primary) hypertension: Secondary | ICD-10-CM | POA: Diagnosis not present

## 2019-09-19 DIAGNOSIS — K219 Gastro-esophageal reflux disease without esophagitis: Secondary | ICD-10-CM | POA: Diagnosis not present

## 2019-09-19 DIAGNOSIS — E785 Hyperlipidemia, unspecified: Secondary | ICD-10-CM | POA: Diagnosis not present

## 2019-09-19 DIAGNOSIS — E669 Obesity, unspecified: Secondary | ICD-10-CM | POA: Diagnosis not present

## 2019-09-19 DIAGNOSIS — R609 Edema, unspecified: Secondary | ICD-10-CM | POA: Diagnosis not present

## 2019-10-11 ENCOUNTER — Ambulatory Visit: Payer: Medicare HMO | Attending: Internal Medicine

## 2019-10-11 DIAGNOSIS — Z20822 Contact with and (suspected) exposure to covid-19: Secondary | ICD-10-CM

## 2019-10-11 DIAGNOSIS — Z20828 Contact with and (suspected) exposure to other viral communicable diseases: Secondary | ICD-10-CM | POA: Diagnosis not present

## 2019-10-12 LAB — NOVEL CORONAVIRUS, NAA: SARS-CoV-2, NAA: NOT DETECTED

## 2019-10-25 ENCOUNTER — Ambulatory Visit: Payer: Medicare Other | Attending: Internal Medicine

## 2019-10-25 DIAGNOSIS — Z23 Encounter for immunization: Secondary | ICD-10-CM | POA: Insufficient documentation

## 2019-10-25 NOTE — Progress Notes (Signed)
   Covid-19 Vaccination Clinic  Name:  Alexandra Calderon    MRN: [id number] DOB: 07/31/1941  10/25/2019  Ms. Alexandra Calderon was observed post Covid-19 immunization for 30 minutes based on pre-vaccination screening without incidence. She was provided with Vaccine Information Sheet and instruction to access the V-Safe system.   Ms. Alexandra Calderon was instructed to call 911 with any severe reactions post vaccine: Marland Kitchen Difficulty breathing  . Swelling of your face and throat  . A fast heartbeat  . A bad rash all over your body  . Dizziness and weakness    Immunizations Administered    Name Date Dose VIS Date Route   Pfizer COVID-19 Vaccine 10/25/2019  9:51 AM 0.3 mL 09/23/2019 Intramuscular   Manufacturer: [employer], [employer]   Lot: [id number]   [place]: [id number]

## 2019-11-10 ENCOUNTER — Other Ambulatory Visit: Payer: Self-pay

## 2019-11-10 MED ORDER — SPIRONOLACTONE 25 MG PO TABS: 25.0000 mg | ORAL_TABLET | Freq: Every day | ORAL | 3 refills | Status: DC

## 2019-11-14 ENCOUNTER — Ambulatory Visit: Payer: Medicare HMO

## 2019-11-14 ENCOUNTER — Ambulatory Visit: Payer: Medicare HMO | Attending: Internal Medicine

## 2019-11-14 DIAGNOSIS — Z23 Encounter for immunization: Secondary | ICD-10-CM

## 2019-11-14 NOTE — Progress Notes (Signed)
   Covid-19 Vaccination Clinic  Name:  Alexandra Calderon    MRN: [id number] DOB: [birth year]  11/14/2019  [name] was observed post Covid-19 immunization for 15 minutes without incidence. She was provided with Vaccine Information Sheet and instruction to access the V-Safe system.   [name] was instructed to call 911 with any severe reactions post vaccine: Marland Kitchen Difficulty breathing  . Swelling of your face and throat  . A fast heartbeat  . A bad rash all over your body  . Dizziness and weakness    Immunizations Administered    Name Date Dose VIS Date Route   Pfizer COVID-19 Vaccine 11/14/2019  9:05 AM 0.3 mL 09/23/2019 Intramuscular   Manufacturer: [place]   Lot: [id number]   [place]: [id number]

## 2019-11-29 ENCOUNTER — Other Ambulatory Visit: Payer: Self-pay | Admitting: Family Medicine

## 2019-11-29 ENCOUNTER — Telehealth: Payer: Self-pay | Admitting: Family Medicine

## 2019-11-29 MED ORDER — CIPROFLOXACIN HCL 500 MG PO TABS: 500.0000 mg | ORAL_TABLET | Freq: Two times a day (BID) | ORAL | 0 refills | Status: AC

## 2019-11-29 MED ORDER — SULFAMETHOXAZOLE-TRIMETHOPRIM 800-160 MG PO TABS: 1.0000 | ORAL_TABLET | Freq: Two times a day (BID) | ORAL | 0 refills | Status: DC

## 2019-11-29 NOTE — Telephone Encounter (Signed)
I will call out bactrim

## 2019-11-29 NOTE — Telephone Encounter (Signed)
Called pt and she states that she is allergic to sulfa and would like to use cipro if possible. Per Dr. Dennard Schaumann ok Cipro 500 bid x 5 days. Pt aware and med sent to [facility]

## 2019-11-29 NOTE — Telephone Encounter (Signed)
Pt called and states that she has a UTI and she took the Keflex for the 5 days and it is still there and wanted to know if she needed a different antibx and if so could you call it in?

## 2019-12-19 ENCOUNTER — Other Ambulatory Visit: Payer: Medicare HMO

## 2019-12-19 ENCOUNTER — Other Ambulatory Visit: Payer: Self-pay

## 2019-12-19 DIAGNOSIS — I10 Essential (primary) hypertension: Secondary | ICD-10-CM | POA: Diagnosis not present

## 2019-12-19 DIAGNOSIS — E785 Hyperlipidemia, unspecified: Secondary | ICD-10-CM

## 2019-12-20 LAB — COMPREHENSIVE METABOLIC PANEL WITH GFR
AG Ratio: 1.4 (calc) (ref 1.0–2.5)
ALT: 10 U/L (ref 6–29)
AST: 14 U/L (ref 10–35)
Albumin: 4 g/dL (ref 3.6–5.1)
Alkaline phosphatase (APISO): 71 U/L (ref 37–153)
BUN/Creatinine Ratio: 16 (calc) (ref 6–22)
BUN: 16 mg/dL (ref 7–25)
CO2: 30 mmol/L (ref 20–32)
Calcium: 9.3 mg/dL (ref 8.6–10.4)
Chloride: 104 mmol/L (ref 98–110)
Creat: 0.99 mg/dL — ABNORMAL HIGH (ref 0.60–0.93)
Globulin: 2.8 g/dL (ref 1.9–3.7)
Glucose, Bld: 92 mg/dL (ref 65–99)
Potassium: 5.1 mmol/L (ref 3.5–5.3)
Sodium: 142 mmol/L (ref 135–146)
Total Bilirubin: 0.7 mg/dL (ref 0.2–1.2)
Total Protein: 6.8 g/dL (ref 6.1–8.1)

## 2019-12-20 LAB — LIPID PANEL
Cholesterol: 132 mg/dL
HDL: 37 mg/dL — ABNORMAL LOW
LDL Cholesterol (Calc): 77 mg/dL
Non-HDL Cholesterol (Calc): 95 mg/dL
Total CHOL/HDL Ratio: 3.6 (calc)
Triglycerides: 98 mg/dL

## 2019-12-20 LAB — CBC WITH DIFFERENTIAL/PLATELET
Absolute Monocytes: 500 {cells}/uL (ref 200–950)
Basophils Absolute: 31 {cells}/uL (ref 0–200)
Basophils Relative: 0.6 %
Eosinophils Absolute: 41 {cells}/uL (ref 15–500)
Eosinophils Relative: 0.8 %
HCT: 43 % (ref 35.0–45.0)
Hemoglobin: 13.9 g/dL (ref 11.7–15.5)
Lymphs Abs: 1474 {cells}/uL (ref 850–3900)
MCH: 28.8 pg (ref 27.0–33.0)
MCHC: 32.3 g/dL (ref 32.0–36.0)
MCV: 89.2 fL (ref 80.0–100.0)
MPV: 11.3 fL (ref 7.5–12.5)
Monocytes Relative: 9.8 %
Neutro Abs: 3055 {cells}/uL (ref 1500–7800)
Neutrophils Relative %: 59.9 %
Platelets: 209 10*3/uL (ref 140–400)
RBC: 4.82 Million/uL (ref 3.80–5.10)
RDW: 14.3 % (ref 11.0–15.0)
Total Lymphocyte: 28.9 %
WBC: 5.1 10*3/uL (ref 3.8–10.8)

## 2020-01-04 ENCOUNTER — Ambulatory Visit (INDEPENDENT_AMBULATORY_CARE_PROVIDER_SITE_OTHER): Payer: Medicare HMO | Admitting: Nurse Practitioner

## 2020-01-04 DIAGNOSIS — J302 Other seasonal allergic rhinitis: Secondary | ICD-10-CM

## 2020-01-04 MED ORDER — LORATADINE 10 MG PO TABS: 10.0000 mg | ORAL_TABLET | Freq: Every day | ORAL | 11 refills | Status: DC

## 2020-01-04 MED ORDER — FLUTICASONE PROPIONATE 50 MCG/ACT NA SUSP: 2.0000 | Freq: Every day | NASAL | 6 refills | Status: DC

## 2020-01-04 NOTE — Progress Notes (Signed)
Telephone Note    I connected withBobbie Calderon Ped on 01/04/2020 at 2:08PM by telephoneand verified that I am speaking with the correct person using two identifiers.  Pt location: at home   Physician location:  In office, [place], Ishmael Holter, FNP-C    On call: patient and physician   I discussed the limitations, risks, security and privacy concerns of performing an evaluation and management service by telephone and the availability of in person appointments. I also discussed with the patient that there may be a patient responsible charge related to this service. The patient expressed understanding and agreed to proceed.  History of Present Illness: Pt is a 79 year old female presenting for telephone visit with sxs of sore throat that began yesterday. She has used tylenol with some relief that is temporary. She reports that her throat looks a bit red. No fever/chills. She reports that she always has nasal stuffiness, clear running for years and this is the same as usual. She admits to h/o seasonal allergies without treatment. No sick contacts. No cp/ct, gu/gi sxs, pain, sob, edema.    Observations/Objective: NAD noted, able to speak in full sentences, a/ox3, no sob or wheezing heard.  Assessment and Plan: Your symptoms are consistent with seasonal allergies-rhinitis Flonase 2 sprays each nostril each day for 7 day then reduce to one spray daily Claritin 10 mg daily ongoing Warm water gurgles, throat lozenge  May use tylenol or ibuprofen for discomfort Avoid allergens  Follow Up Instructions: if symptoms worsen or fail to improve  I discussed the assessment and treatment plan with the patient. The patient was provided an opportunity to ask questions and all were answered. The patient agreed with the plan and demonstrated an understanding of the instructions.  The patient was advised to call back or seek an in-person evaluation if the symptoms worsen or if  the condition fails to improve as anticipated.  I provided 10  minutes of non-face-to-face time during this encounter. End Time 2:18PM   Ishmael Holter, FNP-C

## 2020-02-21 ENCOUNTER — Encounter: Payer: Self-pay | Admitting: Family Medicine

## 2020-02-21 ENCOUNTER — Ambulatory Visit
Admission: RE | Admit: 2020-02-21 | Discharge: 2020-02-21 | Disposition: A | Discharge status: Home / Self Care | Payer: Medicare HMO | Source: Ambulatory Visit | Attending: Family Medicine | Admitting: Family Medicine

## 2020-02-21 ENCOUNTER — Ambulatory Visit (INDEPENDENT_AMBULATORY_CARE_PROVIDER_SITE_OTHER): Payer: Medicare HMO | Admitting: Family Medicine

## 2020-02-21 ENCOUNTER — Other Ambulatory Visit: Payer: Self-pay

## 2020-02-21 ENCOUNTER — Other Ambulatory Visit: Payer: Self-pay | Admitting: Family Medicine

## 2020-02-21 VITALS — BP 130/70 | HR 60 | Temp 97.2°F | Resp 14 | Ht 67.0 in | Wt 194.0 lb

## 2020-02-21 DIAGNOSIS — R0781 Pleurodynia: Secondary | ICD-10-CM

## 2020-02-21 DIAGNOSIS — R0789 Other chest pain: Secondary | ICD-10-CM

## 2020-02-21 DIAGNOSIS — M5414 Radiculopathy, thoracic region: Secondary | ICD-10-CM | POA: Diagnosis not present

## 2020-02-21 MED ORDER — CEPHALEXIN 500 MG PO CAPS: 500.0000 mg | ORAL_CAPSULE | Freq: Every day | ORAL | 0 refills | Status: DC | PRN

## 2020-02-21 NOTE — Progress Notes (Signed)
Subjective:    Patient ID: Alexandra Calderon, female    DOB: 11-Jan-1941, 79 y.o.   MRN: [id number]  Back Pain  07/2018 Patient has been dealing with low back pain and mid back pain for approximately 2 months now.  Pain is located approximately the level of T11-L2.  It radiates into her right flank.  Movement makes it worse.  She denies any sciatica in her legs.  She denies any numbness or weakness in her legs.  She denies any burning paresthesias.  She denies any pleurisy or hemoptysis.  She denies any fevers or chills.  She is mildly tender to palpation over the 12th rib posteriorly on the right side.  She is also tender to palpation in the thoracic and lumbar paraspinal muscles.  She denies any falls or injury.  At that time, my plan was: Obtain an x-ray of the thoracic and lumbar spine.  Suspect degenerative disc disease and a.  If so I would likely recommend physical therapy in an effort to treat.  She denies any dysuria hematuria urgency or frequency and therefore I do not believe this is urinary tract in nature.  Await the results of the x-ray  02/21/2020 Symptoms have returned for the last 2 to 3 months.  She states that she constantly has a pain in her middle of her back around the level of T11-T12.  The pain radiates around below her rib to the right upper quadrant.  This usually occurs when she turns to her right for instance when she has to clean up after having a bowel movement.  She will feel a sharp electrical-like pain radiate from the center of her back around under her ribs into her right upper quadrant and right mid abdomen.  Pain is electrical nerve like pain.  Over the last month, the pain has become constant.  There is a dull constant pain in that area however pain is sharply intensified whenever she twists or rotates her torso particular to the right.  She denies any cough.  She denies any hemoptysis.  She denies any shortness of breath.  She denies any vomiting.  She denies any  diarrhea.  She denies any constipation.  She denies any dysuria or hematuria.  Past Medical History:  Diagnosis Date  . Allergy   . Arthritis   . Breast cancer ([place])    right  . Colon adenomas [place]:[id number]  . Constipation   . Diverticulosis   . Esophagitis   . GERD (gastroesophageal reflux disease)   . Hearing loss   . Hyperlipidemia   . Hypertension   . Irritable bowel syndrome 08/27/2012  . Kidney stones   . Osteopenia   . Palpitations   . Personal history of radiation therapy   . Pneumonia   . Polyp, stomach   . PVC (premature ventricular contraction)   . UTI (lower urinary tract infection)    Past Surgical History:  Procedure Laterality Date  . BREAST LUMPECTOMY Right 07/01/11   w/sentinal node bx - right side  . COLONOSCOPY W/ BIOPSIES    . DILATION AND CURETTAGE OF UTERUS    . ESOPHAGOGASTRODUODENOSCOPY    . kidney stones    . shoulder arthroscopy rt 2009    . SHOULDER SURGERY     Current Outpatient Medications on File Prior to Visit  Medication Sig Dispense Refill  . ALPRAZolam (XANAX) 0.25 MG tablet Take 1 tablet (0.25 mg total) by mouth at bedtime as needed for anxiety or sleep. 90 tablet  1  . atenolol (TENORMIN) 50 MG tablet TAKE 2 TABLETS BY MOUTH EVERY DAY 180 tablet 1  . cholecalciferol (VITAMIN D) 1000 units tablet Take 2,000 Units by mouth daily.    . Coenzyme Q10 (CO Q 10 PO) Take 100 mg by mouth daily.    . famotidine (PEPCID) 20 MG tablet TAKE 1 TABLET BY MOUTH TWICE A DAY 180 tablet 1  . losartan (COZAAR) 25 MG tablet Take 25 mg by mouth daily.    Marland Kitchen omeprazole (PRILOSEC) 40 MG capsule Take 1 capsule (40 mg total) by mouth daily. 30 capsule 3  . OVER THE COUNTER MEDICATION Take 1 tablet by mouth daily. [facility] stool softener daily    . simvastatin (ZOCOR) 20 MG tablet TAKE 1 TABLET BY MOUTH EVERY DAY 90 tablet 1  . spironolactone (ALDACTONE) 25 MG tablet Take 1 tablet (25 mg total) by mouth daily. 90 tablet 3   No current facility-administered  medications on file prior to visit.    Allergies  Allergen Reactions  . Gatifloxacin     unknown  . Levofloxacin     REACTION: rash  . Nitrofurantoin Macrocrystal     unknown  . Sulfamethoxazole-Trimethoprim Rash and Other (See Comments)    Chills, fever  . Sulfonamide Derivatives Rash and Other (See Comments)    Chills, fever   Socioeconomic History  . Marital status: Married    Spouse name: Not on file  . Number of children: 2  . Years of education: Not on file  . Highest education level: Not on file  Social Needs  . Financial resource strain: Not on file  . Food insecurity - worry: Not on file  . Food insecurity - inability: Not on file  . Transportation needs - medical: Not on file  . Transportation needs - non-medical: Not on file  Occupational History  . Occupation: retired  Tobacco Use  . Smoking status: Never Smoker  . Smokeless tobacco: Never Used  Substance and Sexual Activity  . Alcohol use: No  . Drug use: No  . Sexual activity: Yes  Other Topics Concern  . Not on file  Social History Narrative   Spouse:  [name]   Children:   [name]- age 57- [place]- age 47- [place]      Review of Systems  Musculoskeletal: Positive for back pain.  All other systems reviewed and are negative.      Objective:    Vital signs are reviewed and are normal.  Cardiovascular is regular rate and rhythm with no murmurs rubs or gallops.  Pulmonary exam is normal.  Abdomen is soft, nondistended, nontender. Patient has tenderness to palpation in the lumbar and thoracic paraspinal muscles on the right side.  She is also tender to palpation over the posterior aspect of the right 12th rib.  She has normal range of motion.  She has muscle strength 5/5 equal and symmetric in the upper and lower extremities.  She has negative straight leg raise and normal reflexes.  There is no shingles-like rash.  There is no erythema on the skin.  There is no tenderness to  palpation in the right upper quadrant.     Assessment & Plan:  Rib pain - Plan: DG Ribs Unilateral Right  Thoracic radiculopathy  I suspect the patient has a pinched nerve in her thoracic spine probably around the T11-T12 area.  I believe twisting motions likely exacerbate the nerve impingement triggering radicular-like pain around underneath the  ribs.  Begin by obtaining an x-ray of the ribs to rule out pathologic lesions in the ribs.  If x-ray is normal, her thoracic and lumbar spine x-rays were normal in 2019, I would recommend an MRI of the thoracic spine.

## 2020-02-23 ENCOUNTER — Other Ambulatory Visit: Payer: Self-pay | Admitting: Family Medicine

## 2020-02-23 DIAGNOSIS — R0782 Intercostal pain: Secondary | ICD-10-CM

## 2020-02-23 DIAGNOSIS — M5414 Radiculopathy, thoracic region: Secondary | ICD-10-CM

## 2020-03-06 DIAGNOSIS — H52203 Unspecified astigmatism, bilateral: Secondary | ICD-10-CM | POA: Diagnosis not present

## 2020-03-06 DIAGNOSIS — H2513 Age-related nuclear cataract, bilateral: Secondary | ICD-10-CM | POA: Diagnosis not present

## 2020-03-06 DIAGNOSIS — Z0100 Encounter for examination of eyes and vision without abnormal findings: Secondary | ICD-10-CM | POA: Diagnosis not present

## 2020-03-06 DIAGNOSIS — H524 Presbyopia: Secondary | ICD-10-CM | POA: Diagnosis not present

## 2020-03-21 ENCOUNTER — Telehealth: Payer: Self-pay | Admitting: Family Medicine

## 2020-03-21 NOTE — Telephone Encounter (Signed)
Pt Has Ct scan this wkend, will consult with her other doctors that has prescribe antibiotic

## 2020-03-21 NOTE — Telephone Encounter (Signed)
CB# [phone] Pt take Cephalexin for her UTI not working would like to see if another medication can be prescribe or drop off urine sample

## 2020-03-21 NOTE — Telephone Encounter (Signed)
Error pt sch appt

## 2020-03-24 ENCOUNTER — Ambulatory Visit
Admission: RE | Admit: 2020-03-24 | Discharge: 2020-03-24 | Disposition: A | Discharge status: Home / Self Care | Payer: Medicare HMO | Source: Ambulatory Visit | Attending: Family Medicine | Admitting: Family Medicine

## 2020-03-24 ENCOUNTER — Other Ambulatory Visit: Payer: Self-pay

## 2020-03-24 DIAGNOSIS — R0782 Intercostal pain: Secondary | ICD-10-CM

## 2020-03-24 DIAGNOSIS — M5414 Radiculopathy, thoracic region: Secondary | ICD-10-CM

## 2020-03-24 DIAGNOSIS — M546 Pain in thoracic spine: Secondary | ICD-10-CM | POA: Diagnosis not present

## 2020-04-02 ENCOUNTER — Telehealth: Payer: Self-pay

## 2020-04-02 ENCOUNTER — Other Ambulatory Visit: Payer: Self-pay

## 2020-04-02 ENCOUNTER — Ambulatory Visit (INDEPENDENT_AMBULATORY_CARE_PROVIDER_SITE_OTHER): Payer: Medicare HMO | Admitting: Family Medicine

## 2020-04-02 VITALS — BP 124/70 | HR 64 | Temp 96.0°F | Ht 67.0 in | Wt 196.0 lb

## 2020-04-02 DIAGNOSIS — R1030 Lower abdominal pain, unspecified: Secondary | ICD-10-CM

## 2020-04-02 DIAGNOSIS — R109 Unspecified abdominal pain: Secondary | ICD-10-CM | POA: Diagnosis not present

## 2020-04-02 NOTE — Telephone Encounter (Signed)
Imaging called Alexandra Calderon, [birth year])to get a CT scan, she wanted to know what would you like for her to do? Get it or not.

## 2020-04-02 NOTE — Progress Notes (Signed)
Subjective:    Patient ID: Alexandra Calderon, female    DOB: 12/01/1940, 79 y.o.   MRN: [id number]  Back Pain  07/2018 Patient has been dealing with low back pain and mid back pain for approximately 2 months now.  Pain is located approximately the level of T11-L2.  It radiates into her right flank.  Movement makes it worse.  She denies any sciatica in her legs.  She denies any numbness or weakness in her legs.  She denies any burning paresthesias.  She denies any pleurisy or hemoptysis.  She denies any fevers or chills.  She is mildly tender to palpation over the 12th rib posteriorly on the right side.  She is also tender to palpation in the thoracic and lumbar paraspinal muscles.  She denies any falls or injury.  At that time, my plan was: Obtain an x-ray of the thoracic and lumbar spine.  Suspect degenerative disc disease and a.  If so I would likely recommend physical therapy in an effort to treat.  She denies any dysuria hematuria urgency or frequency and therefore I do not believe this is urinary tract in nature.  Await the results of the x-ray  02/21/2020 Symptoms have returned for the last 2 to 3 months.  She states that she constantly has a pain in her middle of her back around the level of T11-T12.  The pain radiates around below her rib to the right upper quadrant.  This usually occurs when she turns to her right for instance when she has to clean up after having a bowel movement.  She will feel a sharp electrical-like pain radiate from the center of her back around under her ribs into her right upper quadrant and right mid abdomen.  Pain is electrical nerve like pain.  Over the last month, the pain has become constant.  There is a dull constant pain in that area however pain is sharply intensified whenever she twists or rotates her torso particular to the right.  She denies any cough.  She denies any hemoptysis.  She denies any shortness of breath.  She denies any vomiting.  She denies any  diarrhea.  She denies any constipation.  She denies any dysuria or hematuria.  At that time, my plan was: I suspect the patient has a pinched nerve in her thoracic spine probably around the T11-T12 area.  I believe twisting motions likely exacerbate the nerve impingement triggering radicular-like pain around underneath the ribs.  Begin by obtaining an x-ray of the ribs to rule out pathologic lesions in the ribs.  If x-ray is normal, her thoracic and lumbar spine x-rays were normal in 2019, I would recommend an MRI of the thoracic spine.  04/02/20 Patient's MRI revealed no evidence of nerve impingement.  She continues to have right-sided abdominal wall pain.  She states it primarily occurs with twisting motions.  It radiates around in a dermatomal pattern from T11 and T12 to her mid abdomen in the front roughly around her umbilicus.  The pain is sharp and stabbing.  It occurs on a daily basis.  It occurs without provocation however it can also occur with twisting motion.  She denies any melena or hematochezia or nausea or vomiting however the pain has been present now for 4 months.  She denies any fevers or chills or chest pain.  She denies any pleurisy or hemoptysis.  She denies any dyspnea on exertion.  She denies any hematuria or dysuria or urgency or frequency  Past Medical History:  Diagnosis Date  . Allergy   . Arthritis   . Breast cancer ([place])    right  . Colon adenomas 1245,8099  . Constipation   . Diverticulosis   . Esophagitis   . GERD (gastroesophageal reflux disease)   . Hearing loss   . Hyperlipidemia   . Hypertension   . Irritable bowel syndrome 08/27/2012  . Kidney stones   . Osteopenia   . Palpitations   . Personal history of radiation therapy   . Pneumonia   . Polyp, stomach   . PVC (premature ventricular contraction)   . UTI (lower urinary tract infection)    Past Surgical History:  Procedure Laterality Date  . BREAST LUMPECTOMY Right 07/01/11   w/sentinal node bx -  right side  . COLONOSCOPY W/ BIOPSIES    . DILATION AND CURETTAGE OF UTERUS    . ESOPHAGOGASTRODUODENOSCOPY    . kidney stones    . shoulder arthroscopy rt 2009    . SHOULDER SURGERY     Current Outpatient Medications on File Prior to Visit  Medication Sig Dispense Refill  . ALPRAZolam (XANAX) 0.25 MG tablet Take 1 tablet (0.25 mg total) by mouth at bedtime as needed for anxiety or sleep. 90 tablet 1  . atenolol (TENORMIN) 50 MG tablet TAKE 2 TABLETS BY MOUTH EVERY DAY 180 tablet 1  . cholecalciferol (VITAMIN D) 1000 units tablet Take 2,000 Units by mouth daily.    . Coenzyme Q10 (CO Q 10 PO) Take 100 mg by mouth daily.    . famotidine (PEPCID) 20 MG tablet TAKE 1 TABLET BY MOUTH TWICE A DAY 180 tablet 1  . losartan (COZAAR) 25 MG tablet Take 25 mg by mouth daily.    Marland Kitchen omeprazole (PRILOSEC) 40 MG capsule Take 1 capsule (40 mg total) by mouth daily. 30 capsule 3  . OVER THE COUNTER MEDICATION Take 1 tablet by mouth daily. [facility] stool softener daily    . simvastatin (ZOCOR) 20 MG tablet TAKE 1 TABLET BY MOUTH EVERY DAY 90 tablet 1  . spironolactone (ALDACTONE) 25 MG tablet Take 1 tablet (25 mg total) by mouth daily. 90 tablet 3   No current facility-administered medications on file prior to visit.    Allergies  Allergen Reactions  . Gatifloxacin     unknown  . Levofloxacin     REACTION: rash  . Nitrofurantoin Macrocrystal     unknown  . Sulfamethoxazole-Trimethoprim Rash and Other (See Comments)    Chills, fever  . Sulfonamide Derivatives Rash and Other (See Comments)    Chills, fever   Socioeconomic History  . Marital status: Married    Spouse name: Not on file  . Number of children: 2  . Years of education: Not on file  . Highest education level: Not on file  Social Needs  . Financial resource strain: Not on file  . Food insecurity - worry: Not on file  . Food insecurity - inability: Not on file  . Transportation needs - medical: Not on file  . Transportation needs -  non-medical: Not on file  Occupational History  . Occupation: retired  Tobacco Use  . Smoking status: Never Smoker  . Smokeless tobacco: Never Used  Substance and Sexual Activity  . Alcohol use: No  . Drug use: No  . Sexual activity: Yes  Other Topics Concern  . Not on file  Social History Narrative   Spouse:  [name]   Children:   [name]-  age 65- [place]- age 77- [place]      Review of Systems  Musculoskeletal: Positive for back pain.  All other systems reviewed and are negative.      Objective:    Vital signs are reviewed and are normal.  Cardiovascular is regular rate and rhythm with no murmurs rubs or gallops.  Pulmonary exam is normal.  Abdomen is soft, nondistended, nontender. Patient has tenderness to palpation in the lumbar and thoracic paraspinal muscles on the right side.  She is also tender to palpation over the posterior aspect of the right 12th rib.  She has normal range of motion.  She has muscle strength 5/5 equal and symmetric in the upper and lower extremities.  She has negative straight leg raise and normal reflexes.  There is no shingles-like rash.  There is no erythema on the skin.  There is no tenderness to palpation in the right upper quadrant.     Assessment & Plan:  Right lateral abdominal pain  At this point most likely diagnosis is some type of musculoskeletal wall pain.  We will try to schedule a CT scan of the abdomen and pelvis to rule out other potential causes.  Other possible causes on the differential diagnosis includes peritoneal carcinomatosis, adhesions, abdominal mass.

## 2020-04-03 NOTE — Telephone Encounter (Signed)
Spoke with pt, she's going to get her CT scan

## 2020-04-03 NOTE — Telephone Encounter (Signed)
I would get it.

## 2020-04-18 ENCOUNTER — Ambulatory Visit
Admission: RE | Admit: 2020-04-18 | Discharge: 2020-04-18 | Disposition: A | Discharge status: Home / Self Care | Payer: Medicare HMO | Source: Ambulatory Visit | Attending: Family Medicine | Admitting: Family Medicine

## 2020-04-18 DIAGNOSIS — K760 Fatty (change of) liver, not elsewhere classified: Secondary | ICD-10-CM | POA: Diagnosis not present

## 2020-04-18 DIAGNOSIS — K8020 Calculus of gallbladder without cholecystitis without obstruction: Secondary | ICD-10-CM | POA: Diagnosis not present

## 2020-04-18 DIAGNOSIS — R1030 Lower abdominal pain, unspecified: Secondary | ICD-10-CM

## 2020-04-18 DIAGNOSIS — R109 Unspecified abdominal pain: Secondary | ICD-10-CM

## 2020-04-18 MED ORDER — IOPAMIDOL (ISOVUE-300) INJECTION 61%
100.0000 mL | Freq: Once | INTRAVENOUS | Status: AC | PRN
  Administered 2020-04-18: 100 mL via INTRAVENOUS

## 2020-04-20 ENCOUNTER — Encounter: Payer: Self-pay | Admitting: Family Medicine

## 2020-05-08 ENCOUNTER — Telehealth: Payer: Self-pay

## 2020-05-08 ENCOUNTER — Other Ambulatory Visit: Payer: Self-pay | Admitting: Family Medicine

## 2020-05-08 MED ORDER — CEPHALEXIN 500 MG PO CAPS: 500.0000 mg | ORAL_CAPSULE | Freq: Every day | ORAL | 1 refills | Status: DC | PRN

## 2020-05-08 NOTE — Telephone Encounter (Signed)
Pt called to see if her provider would send in keflex. Her UTI is bothering her again, her urine is smelling, hurts when she urinates. She stopped her antibiotic the started back. Stopped July 19, and Started back on July 22 with problems. She hasn't taking anymore since then.

## 2020-05-21 NOTE — Telephone Encounter (Signed)
This encounter was created in error - please disregard.

## 2020-05-22 ENCOUNTER — Other Ambulatory Visit: Payer: Self-pay | Admitting: Family Medicine

## 2020-05-22 DIAGNOSIS — Z1231 Encounter for screening mammogram for malignant neoplasm of breast: Secondary | ICD-10-CM

## 2020-05-23 ENCOUNTER — Other Ambulatory Visit: Payer: Medicare HMO

## 2020-05-23 ENCOUNTER — Other Ambulatory Visit: Payer: Self-pay

## 2020-05-23 DIAGNOSIS — R399 Unspecified symptoms and signs involving the genitourinary system: Secondary | ICD-10-CM

## 2020-05-24 ENCOUNTER — Ambulatory Visit: Payer: Medicare HMO | Admitting: Internal Medicine

## 2020-05-24 ENCOUNTER — Encounter: Payer: Self-pay | Admitting: Family Medicine

## 2020-05-24 ENCOUNTER — Encounter: Payer: Self-pay | Admitting: Internal Medicine

## 2020-05-24 VITALS — BP 132/80 | HR 65 | Ht 65.0 in | Wt 194.0 lb

## 2020-05-24 DIAGNOSIS — E669 Obesity, unspecified: Secondary | ICD-10-CM

## 2020-05-24 DIAGNOSIS — E66811 Obesity, class 1: Secondary | ICD-10-CM

## 2020-05-24 DIAGNOSIS — Z860101 Personal history of adenomatous and serrated colon polyps: Secondary | ICD-10-CM

## 2020-05-24 DIAGNOSIS — Z86010 Personal history of colonic polyps: Secondary | ICD-10-CM

## 2020-05-24 DIAGNOSIS — K760 Fatty (change of) liver, not elsewhere classified: Secondary | ICD-10-CM

## 2020-05-24 DIAGNOSIS — K582 Mixed irritable bowel syndrome: Secondary | ICD-10-CM

## 2020-05-24 LAB — URINALYSIS, ROUTINE W REFLEX MICROSCOPIC
Bilirubin Urine: NEGATIVE
Glucose, UA: NEGATIVE
Hgb urine dipstick: NEGATIVE
Leukocytes,Ua: NEGATIVE
Nitrite: NEGATIVE
Protein, ur: NEGATIVE
Specific Gravity, Urine: 1.029 (ref 1.001–1.03)
pH: 5.5 (ref 5.0–8.0)

## 2020-05-24 NOTE — Progress Notes (Signed)
JAMANI ELEY 79 y.o. [birth year] [id number]  Assessment & Plan:   Encounter Diagnoses  Name Primary?  Marland Kitchen Hx of adenomatous colonic polyps Yes  . NAFLD (nonalcoholic fatty liver disease)   . Obesity, Class I, BMI 30-34.9   . Irritable bowel syndrome with both constipation and diarrhea     She has elected not to continue colonoscopy surveillance.  She is aware that if she has significant changes in bowel habits or bleeding or anemia etc. we would consider a diagnostic colonoscopy.  Regarding fatty liver and obesity I have encouraged her to try to reduce carbohydrate intake and to consider restricted feeding/possible intermittent fasting and have referred her to several reputable websites.  See after visit summary.  Continuing fiber to treat irritable bowel syndrome and promote better defecation makes sense.  CC: Susy Frizzle, MD    Subjective:   Chief Complaint: hx colon polyps  HPI The patient has a history of adenomatous colon polyps and a family history of colon cancer with last colonoscopy in June 2016 with 2 small adenomas, she had a 12 mm tubulovillous adenoma in 2003 and a 6 mm adenoma in 2006.  She has no active GI symptoms other than stable IBS where she will have multiple bowel movements over a couple or few days and then go without defecation.  Sometimes she will have cramps and urgent defecation but this is all stable.  She had some right flank and right upper quadrant pain in the summer and had a CT scan in July that did not show any significant abnormalities with respect to that though she did have fatty liver.  She does not consume significant alcohol.  She is interested in trying to reduce weight and improve metabolic syndrome issues. She is not inclined to repeat colonoscopy.   She does daily a fair amount of processed carbs, she drinks bottled tea with sugar in it.  Little Debbie snack cakes are eaten frequently.  She was eating oatmeal every day but recently  switched to brand bunions which are helping with better defecation. Allergies  Allergen Reactions  . Gatifloxacin     unknown  . Nitrofurantoin Macrocrystal     unknown  . Azithromycin Rash  . Levofloxacin Rash    REACTION: rash REACTION: rash  . Sulfamethoxazole-Trimethoprim Rash and Other (See Comments)    Chills, fever  . Sulfonamide Derivatives Rash and Other (See Comments)    Chills, fever   Current Meds  Medication Sig  . alendronate (FOSAMAX) 70 MG tablet TAKE 1 TABLET BY MOUTH EVERY 7 DAYS WITH A FULL GLASS OF WATER ON AN EMPTY STOMACH.  Marland Kitchen atenolol (TENORMIN) 50 MG tablet TAKE 1 TABLET BY MOUTH TWICE A DAY  . cephALEXin (KEFLEX) 500 MG capsule Take 1 capsule (500 mg total) by mouth daily as needed.  . cholecalciferol (VITAMIN D) 1000 units tablet Take 2,000 Units by mouth daily.  . Coenzyme Q10 (CO Q 10 PO) Take 100 mg by mouth daily.  Marland Kitchen diltiazem (CARDIZEM) 30 MG tablet Take 1 tablet (30 mg total) by mouth 4 (four) times daily as needed (palpitations).  . losartan (COZAAR) 25 MG tablet Take 1 tablet (25 mg total) by mouth daily.  Marland Kitchen omeprazole (PRILOSEC) 40 MG capsule TAKE 1 CAPSULE BY MOUTH EVERY DAY  . OVER THE COUNTER MEDICATION Take 1 tablet by mouth daily. [facility] stool softener daily  . rosuvastatin (CRESTOR) 5 MG tablet Take 1 tablet (5 mg total) by mouth daily.  Marland Kitchen spironolactone (  ALDACTONE) 25 MG tablet Take 1 tablet (25 mg total) by mouth daily.   Past Medical History:  Diagnosis Date  . Allergy   . Arthritis   . Breast cancer ([place])    right  . Colon adenomas [id number]  . Constipation   . Diverticulosis   . Esophagitis   . GERD (gastroesophageal reflux disease)   . Hearing loss   . Hyperlipidemia   . Hypertension   . Irritable bowel syndrome 08/27/2012  . Kidney stones   . Osteopenia   . Palpitations   . Personal history of radiation therapy   . Pneumonia   . Polyp, stomach   . PVC (premature ventricular contraction)   . UTI (lower urinary tract  infection)    Past Surgical History:  Procedure Laterality Date  . BREAST LUMPECTOMY Right 07/01/11   w/sentinal node bx - right side  . COLONOSCOPY W/ BIOPSIES    . DILATION AND CURETTAGE OF UTERUS    . ESOPHAGOGASTRODUODENOSCOPY    . kidney stones    . shoulder arthroscopy rt 2009    . SHOULDER SURGERY     Social History   Social History Narrative   Spouse:  [name]   Children:   [name]- age 79- [place]   Stephnie Parlier- age 68- [place]   family history includes Colon cancer in her mother; Diabetes in her maternal aunt and paternal grandmother; Hodgkin's lymphoma in her father; Lymphoma in her father.   Review of Systems  As per HPI Objective:   Physical Exam BP 132/80   Pulse 65   Ht 5\' 5"  (1.651 m)   Wt 194 lb (88 kg)   LMP  (LMP Unknown)   BMI 32.28 kg/m   Total time 32 minutes

## 2020-05-24 NOTE — Patient Instructions (Signed)
Good to see you today. We have decided to stop routine colonoscopy. If you notice bleeding, changes in bowels etc please come back here or see Dr. Dennard Schaumann.  We talked about fatty liver (non-alcoholic fatty liver disease) and obesity.  Please try to eliminate sugary drinks, sugary packaged snacks (Little debbie, etc) and look into the following ways to eat:  Lower carbohydrate Mediterranean diet Restricted feeding and intermittent fasting  A great book is The Obesity Code by Dr. Sharman Cheek  Dietcoctor.com is a wonderful website to guide you to do lower carb and restricted feeding time and intermittent fasting.  Remember - have to lower the insulin levels by changing what you eat.  Emmit Alexanders!  I appreciate the opportunity to care for you. Gatha Mayer, MD, [facility]

## 2020-05-29 DIAGNOSIS — B078 Other viral warts: Secondary | ICD-10-CM | POA: Diagnosis not present

## 2020-05-29 DIAGNOSIS — L821 Other seborrheic keratosis: Secondary | ICD-10-CM | POA: Diagnosis not present

## 2020-05-29 DIAGNOSIS — L820 Inflamed seborrheic keratosis: Secondary | ICD-10-CM | POA: Diagnosis not present

## 2020-05-30 ENCOUNTER — Telehealth: Payer: Self-pay | Admitting: Hematology and Oncology

## 2020-05-30 NOTE — Telephone Encounter (Signed)
Released OV notes to [place] breast cancer study to [phone]  Release:  [id number]

## 2020-05-31 ENCOUNTER — Other Ambulatory Visit: Payer: Self-pay | Admitting: Cardiovascular Disease

## 2020-06-21 ENCOUNTER — Other Ambulatory Visit: Payer: Self-pay

## 2020-06-21 ENCOUNTER — Other Ambulatory Visit: Payer: Medicare HMO

## 2020-06-21 DIAGNOSIS — I10 Essential (primary) hypertension: Secondary | ICD-10-CM

## 2020-06-21 LAB — BASIC METABOLIC PANEL WITH GFR
BUN/Creatinine Ratio: 16 (calc) (ref 6–22)
BUN: 16 mg/dL (ref 7–25)
CO2: 29 mmol/L (ref 20–32)
Calcium: 9.2 mg/dL (ref 8.6–10.4)
Chloride: 106 mmol/L (ref 98–110)
Creat: 1 mg/dL — ABNORMAL HIGH (ref 0.60–0.93)
Glucose, Bld: 97 mg/dL (ref 65–99)
Potassium: 4.3 mmol/L (ref 3.5–5.3)
Sodium: 144 mmol/L (ref 135–146)

## 2020-06-26 ENCOUNTER — Encounter: Payer: Self-pay | Admitting: Cardiovascular Disease

## 2020-06-26 ENCOUNTER — Other Ambulatory Visit: Payer: Self-pay

## 2020-06-26 ENCOUNTER — Ambulatory Visit: Payer: Medicare HMO | Admitting: Cardiovascular Disease

## 2020-06-26 VITALS — BP 144/72 | HR 66 | Ht 66.0 in | Wt 192.2 lb

## 2020-06-26 DIAGNOSIS — E785 Hyperlipidemia, unspecified: Secondary | ICD-10-CM

## 2020-06-26 DIAGNOSIS — I10 Essential (primary) hypertension: Secondary | ICD-10-CM | POA: Diagnosis not present

## 2020-06-26 DIAGNOSIS — I2510 Atherosclerotic heart disease of native coronary artery without angina pectoris: Secondary | ICD-10-CM

## 2020-06-26 MED ORDER — LOSARTAN POTASSIUM 25 MG PO TABS: 25.0000 mg | ORAL_TABLET | Freq: Every day | ORAL | 3 refills | Status: DC

## 2020-06-26 NOTE — Patient Instructions (Signed)
Medication Instructions:  Your physician recommends that you continue on your current medications as directed. Please refer to the Current Medication list given to you today.  *If you need a refill on your cardiac medications before your next appointment, please call your pharmacy*   Lab Work: Your physician recommends that you return for lab work in: 12 months on the day of or a few days before your office visit with Dr. Nahser.  You will need to FAST for this appointment - nothing to eat or drink after midnight the night before except water.   If you have labs (blood work) drawn today and your tests are completely normal, you will receive your results only by: . MyChart Message (if you have MyChart) OR . A paper copy in the mail If you have any lab test that is abnormal or we need to change your treatment, we will call you to review the results.   Testing/Procedures: None Ordered    Follow-Up: At [facility], you and your health needs are our priority.  As part of our continuing mission to provide you with exceptional heart care, we have created designated Provider Care Teams.  These Care Teams include your primary Cardiologist (physician) and Advanced Practice Providers (APPs -  Physician Assistants and Nurse Practitioners) who all work together to provide you with the care you need, when you need it.  Your next appointment:   1 year(s)  The format for your next appointment:   In Person  Provider:   You may see Philip Nahser, MD or one of the following Advanced Practice Providers on your designated Care Team:    Scott Weaver, PA-C  Vin Bhagat, PA-C     

## 2020-06-26 NOTE — Progress Notes (Signed)
Cardiology Office Note   Date:  06/26/2020   ID:  [name], [name] 02-May-1941, MRN [id number]  PCP:  Susy Frizzle, MD  Cardiologist:   Mertie Moores, MD   Chief Complaint  Patient presents with  . Hypertension   Problem List 1. Essential HTN 2. Palpitations - PVCs  3. Hyperlipidemia  4. Irritable bowel syndrome     Previous notes.   Alexandra DAIL is a 79 y.o. female who is being seen today for the evaluation of palpitations  at the request of Susy Frizzle, MD.  Seen with her husband   Alexandra Calderon  Has had palpitations all of her life.    Over the years, they have been stable for years.  Dr. Raliegh Ip increased her atenolol which seemed to have helped.   Has rare , isolated "thumps"  And then normal HR for a while These episodes may go on for several days Seem to be worse after eating , in the evening .  Do not limit her activity .  Not associated with weakness or dizziness, no dyspnea.    Does not get any regular exercise - tries to walk some   Aug. 20, 2018  Alexandra Calderon is here for follow up of her HTN and PVCs  Heart is now "quiet." Exercising some ,  No CP or dyspnea.   Aug. 27, 2019:  Feeling better.   Not as many PVCs now  Takes atenolol 50 mg BID  Discussed weight loss   Sept. 14, 2020  Alexandra Calderon is seen for further evaluation of her palpitations No further palpitations. Walks on occasion.   Gets DOE walking up a hill.  Likely due to deconditioning .   Has several questions today .  Was seen by her primary for palpitations.   Dr. Dennard Schaumann gave her Diltiazem 30 mg to take as needed.   She is still on atenolol 50 mg BID     June 26, 2020: Alexandra Calderon is seen today for follow-up visit.  She has a history of palpitations.  She also has a history of hypertension.  Walks on occasion  No CP , Has occasional palpitations  BP is normal at home  Hx of hyperlipidemia  Not fast foods for several months ,  Wt today is 192 lbs  ( down 4 lbs from last year )  She had  an abdominal CT scan which incidentally found some coronary artery calcifications.  Past Medical History:  Diagnosis Date  . Allergy   . Arthritis   . Breast cancer ([place])    right  . Colon adenomas [id number]  . Constipation   . Diverticulosis   . Esophagitis   . GERD (gastroesophageal reflux disease)   . Hearing loss   . Hyperlipidemia   . Hypertension   . Irritable bowel syndrome 08/27/2012  . Kidney stones   . Osteopenia   . Palpitations   . Personal history of radiation therapy   . Pneumonia   . Polyp, stomach   . PVC (premature ventricular contraction)   . UTI (lower urinary tract infection)     Past Surgical History:  Procedure Laterality Date  . BREAST LUMPECTOMY Right 07/01/11   w/sentinal node bx - right side  . COLONOSCOPY W/ BIOPSIES    . DILATION AND CURETTAGE OF UTERUS    . ESOPHAGOGASTRODUODENOSCOPY    . kidney stones    . shoulder arthroscopy rt 2009    . SHOULDER SURGERY       Current  Outpatient Medications  Medication Sig Dispense Refill  . alendronate (FOSAMAX) 70 MG tablet TAKE 1 TABLET BY MOUTH EVERY 7 DAYS WITH A FULL GLASS OF WATER ON AN EMPTY STOMACH. 12 tablet 3  . atenolol (TENORMIN) 50 MG tablet TAKE 1 TABLET BY MOUTH TWICE A DAY 180 tablet 3  . cephALEXin (KEFLEX) 500 MG capsule Take 1 capsule (500 mg total) by mouth daily as needed. 30 capsule 1  . cholecalciferol (VITAMIN D) 1000 units tablet Take 2,000 Units by mouth daily.    . Coenzyme Q10 (CO Q 10 PO) Take 100 mg by mouth daily.    Marland Kitchen diltiazem (CARDIZEM) 30 MG tablet Take 1 tablet (30 mg total) by mouth 4 (four) times daily as needed (palpitations). 60 tablet 6  . losartan (COZAAR) 25 MG tablet Take 1 tablet (25 mg total) by mouth daily. 90 tablet 3  . omeprazole (PRILOSEC) 40 MG capsule TAKE 1 CAPSULE BY MOUTH EVERY DAY 90 capsule 3  . OVER THE COUNTER MEDICATION Take 1 tablet by mouth daily. [facility] stool softener daily    . rosuvastatin (CRESTOR) 5 MG tablet TAKE 1 TABLET BY MOUTH  EVERY DAY 90 tablet 3  . spironolactone (ALDACTONE) 25 MG tablet Take 1 tablet (25 mg total) by mouth daily. 90 tablet 3   No current facility-administered medications for this visit.    PAD Screen 02/10/2017  Previous PAD dx? No  Previous surgical procedure? No  Pain with walking? No  Feet/toe relief with dangling? No  Painful, non-healing ulcers? No  Extremities discolored? No      Allergies:   Gatifloxacin, Nitrofurantoin macrocrystal, Azithromycin, Levofloxacin, Sulfamethoxazole-trimethoprim, and Sulfonamide derivatives    Social History:  The patient  reports that she has never smoked. She has never used smokeless tobacco. She reports that she does not drink alcohol and does not use drugs.   Family History:  The patient's family history includes Colon cancer in her mother; Diabetes in her maternal aunt and paternal grandmother; Hodgkin's lymphoma in her father; Lymphoma in her father.    ROS:     Noted in current hx , otherwise negative    Physical Exam: Blood pressure (!) 144/72, pulse 66, height 5\' 6"  (1.676 m), weight 192 lb 3.2 oz (87.2 kg), SpO2 98 %.  GEN:  Well nourished, well developed in no acute distress HEENT: Normal NECK: No JVD; No carotid bruits LYMPHATICS: No lymphadenopathy CARDIAC: RRR , no murmurs, rubs, gallops RESPIRATORY:  Clear to auscultation without rales, wheezing or rhonchi  ABDOMEN: Soft, non-tender, non-distended MUSCULOSKELETAL:  No edema; No deformity  SKIN: Warm and dry NEUROLOGIC:  Alert and oriented x 3    EKG:    June 26, 2020: Normal sinus rhythm at 65 beats a minute.  No ST or T wave changes.  Recent Labs: 12/19/2019: ALT 10; Hemoglobin 13.9; Platelets 209 06/21/2020: BUN 16; Creat 1.00; Potassium 4.3; Sodium 144    Lipid Panel    Component Value Date/Time   CHOL 132 12/19/2019 0822   TRIG 98 12/19/2019 0822   HDL 37 (L) 12/19/2019 0822   CHOLHDL 3.6 12/19/2019 0822   VLDL 24.2 04/29/2017 0800   LDLCALC 77 12/19/2019  0822      Wt Readings from Last 3 Encounters:  06/26/20 192 lb 3.2 oz (87.2 kg)  05/24/20 194 lb (88 kg)  04/02/20 196 lb (88.9 kg)      Other studies Reviewed: Additional studies/ records that were reviewed today include: . Review of the above records  demonstrates:    ASSESSMENT AND PLAN:  1.  Palpitations -   Better.   Cont to follow    2. Hypertension: Continue current medications.  Her blood pressures at home are normal.  It is a little bit elevated here.  I have encouraged her to continue to watch her salt.  She will start exercising more regularly.  3.  Hyperlipidemia: Continue rosuvastatin.  I will check labs including lipid profile, liver enzymes, basic metabolic profile in 1 year.  4.  Coronary artery calcifications: The patient was incidentally noted to have coronary artery calcifications during a CT scan of her abdomen.  We discussed getting a coronary calcium score but she declined at this time.  We will continue with aggressive treatment of her hyperlipidemia with rosuvastatin.   Current medicines are reviewed at length with the patient today.  The patient does not have concerns regarding medicines.  Labs/ tests ordered today include:  No orders of the defined types were placed in this encounter.    Disposition:   FU with me in 1 year       Mertie Moores, MD  06/26/2020 11:11 AM    [address], [place], [place]  [address] Phone: [phone]; Fax: [phone]

## 2020-07-04 ENCOUNTER — Other Ambulatory Visit: Payer: Self-pay | Admitting: Family Medicine

## 2020-07-08 ENCOUNTER — Other Ambulatory Visit: Payer: Self-pay | Admitting: Family Medicine

## 2020-07-09 ENCOUNTER — Other Ambulatory Visit: Payer: Self-pay

## 2020-07-09 ENCOUNTER — Ambulatory Visit
Admission: RE | Admit: 2020-07-09 | Discharge: 2020-07-09 | Disposition: A | Discharge status: Home / Self Care | Payer: Medicare HMO | Source: Ambulatory Visit | Attending: Family Medicine | Admitting: Family Medicine

## 2020-07-09 ENCOUNTER — Other Ambulatory Visit: Payer: Self-pay | Admitting: Family Medicine

## 2020-07-09 DIAGNOSIS — Z1231 Encounter for screening mammogram for malignant neoplasm of breast: Secondary | ICD-10-CM

## 2020-07-09 DIAGNOSIS — R234 Changes in skin texture: Secondary | ICD-10-CM

## 2020-07-16 ENCOUNTER — Other Ambulatory Visit: Payer: Medicare HMO

## 2020-07-16 ENCOUNTER — Other Ambulatory Visit: Payer: Self-pay

## 2020-07-16 DIAGNOSIS — Z1159 Encounter for screening for other viral diseases: Secondary | ICD-10-CM | POA: Diagnosis not present

## 2020-07-16 DIAGNOSIS — I10 Essential (primary) hypertension: Secondary | ICD-10-CM | POA: Diagnosis not present

## 2020-07-16 DIAGNOSIS — E785 Hyperlipidemia, unspecified: Secondary | ICD-10-CM | POA: Diagnosis not present

## 2020-07-17 ENCOUNTER — Ambulatory Visit
Admission: RE | Admit: 2020-07-17 | Discharge: 2020-07-17 | Disposition: A | Discharge status: Home / Self Care | Payer: Medicare HMO | Source: Ambulatory Visit | Attending: Family Medicine | Admitting: Family Medicine

## 2020-07-17 DIAGNOSIS — N6489 Other specified disorders of breast: Secondary | ICD-10-CM | POA: Diagnosis not present

## 2020-07-17 DIAGNOSIS — R928 Other abnormal and inconclusive findings on diagnostic imaging of breast: Secondary | ICD-10-CM | POA: Diagnosis not present

## 2020-07-17 DIAGNOSIS — R234 Changes in skin texture: Secondary | ICD-10-CM

## 2020-07-17 LAB — CBC WITH DIFFERENTIAL/PLATELET
Absolute Monocytes: 447 {cells}/uL (ref 200–950)
Basophils Absolute: 19 {cells}/uL (ref 0–200)
Basophils Relative: 0.4 %
Eosinophils Absolute: 52 {cells}/uL (ref 15–500)
Eosinophils Relative: 1.1 %
HCT: 42.3 % (ref 35.0–45.0)
Hemoglobin: 13.6 g/dL (ref 11.7–15.5)
Lymphs Abs: 1274 {cells}/uL (ref 850–3900)
MCH: 29.2 pg (ref 27.0–33.0)
MCHC: 32.2 g/dL (ref 32.0–36.0)
MCV: 91 fL (ref 80.0–100.0)
MPV: 11.5 fL (ref 7.5–12.5)
Monocytes Relative: 9.5 %
Neutro Abs: 2909 {cells}/uL (ref 1500–7800)
Neutrophils Relative %: 61.9 %
Platelets: 190 10*3/uL (ref 140–400)
RBC: 4.65 Million/uL (ref 3.80–5.10)
RDW: 13.8 % (ref 11.0–15.0)
Total Lymphocyte: 27.1 %
WBC: 4.7 10*3/uL (ref 3.8–10.8)

## 2020-07-17 LAB — LIPID PANEL
Cholesterol: 131 mg/dL
HDL: 35 mg/dL — ABNORMAL LOW
LDL Cholesterol (Calc): 76 mg/dL
Non-HDL Cholesterol (Calc): 96 mg/dL
Total CHOL/HDL Ratio: 3.7 (calc)
Triglycerides: 122 mg/dL

## 2020-07-17 LAB — COMPLETE METABOLIC PANEL WITHOUT GFR
AG Ratio: 1.4 (calc) (ref 1.0–2.5)
ALT: 13 U/L (ref 6–29)
AST: 14 U/L (ref 10–35)
Albumin: 4 g/dL (ref 3.6–5.1)
Alkaline phosphatase (APISO): 73 U/L (ref 37–153)
BUN/Creatinine Ratio: 18 (calc) (ref 6–22)
BUN: 18 mg/dL (ref 7–25)
CO2: 27 mmol/L (ref 20–32)
Calcium: 9.3 mg/dL (ref 8.6–10.4)
Chloride: 105 mmol/L (ref 98–110)
Creat: 1.02 mg/dL — ABNORMAL HIGH (ref 0.60–0.93)
GFR, Est African American: 61 mL/min/{1.73_m2}
GFR, Est Non African American: 53 mL/min/{1.73_m2} — ABNORMAL LOW
Globulin: 2.8 g/dL (ref 1.9–3.7)
Glucose, Bld: 97 mg/dL (ref 65–99)
Potassium: 4.6 mmol/L (ref 3.5–5.3)
Sodium: 142 mmol/L (ref 135–146)
Total Bilirubin: 0.7 mg/dL (ref 0.2–1.2)
Total Protein: 6.8 g/dL (ref 6.1–8.1)

## 2020-07-17 LAB — HEPATITIS C ANTIBODY
Hepatitis C Ab: NONREACTIVE
SIGNAL TO CUT-OFF: 0.02

## 2020-07-18 ENCOUNTER — Ambulatory Visit (INDEPENDENT_AMBULATORY_CARE_PROVIDER_SITE_OTHER): Payer: Medicare HMO

## 2020-07-18 ENCOUNTER — Other Ambulatory Visit: Payer: Self-pay

## 2020-07-18 DIAGNOSIS — Z23 Encounter for immunization: Secondary | ICD-10-CM | POA: Diagnosis not present

## 2020-07-23 ENCOUNTER — Other Ambulatory Visit: Payer: Self-pay

## 2020-07-23 ENCOUNTER — Ambulatory Visit (INDEPENDENT_AMBULATORY_CARE_PROVIDER_SITE_OTHER): Payer: Medicare HMO | Admitting: Family Medicine

## 2020-07-23 VITALS — BP 140/70 | HR 64 | Temp 97.4°F | Ht 66.0 in | Wt 186.0 lb

## 2020-07-23 DIAGNOSIS — Z0001 Encounter for general adult medical examination with abnormal findings: Secondary | ICD-10-CM

## 2020-07-23 DIAGNOSIS — I10 Essential (primary) hypertension: Secondary | ICD-10-CM

## 2020-07-23 DIAGNOSIS — E785 Hyperlipidemia, unspecified: Secondary | ICD-10-CM

## 2020-07-23 DIAGNOSIS — Z0000 Encounter for general adult medical examination without abnormal findings: Secondary | ICD-10-CM

## 2020-07-23 DIAGNOSIS — Z853 Personal history of malignant neoplasm of breast: Secondary | ICD-10-CM

## 2020-07-23 DIAGNOSIS — M85851 Other specified disorders of bone density and structure, right thigh: Secondary | ICD-10-CM | POA: Diagnosis not present

## 2020-07-23 MED ORDER — TRIAMCINOLONE ACETONIDE 0.1 % EX CREA: 1.0000 "application " | TOPICAL_CREAM | Freq: Two times a day (BID) | CUTANEOUS | 0 refills | Status: DC

## 2020-07-23 NOTE — Progress Notes (Signed)
Subjective:    Patient ID: Alexandra Calderon, female    DOB: 06-08-1941, 79 y.o.   MRN: [id number]  HPI Patient is a very pleasant 79 year old Caucasian female here today for complete physical exam.  Recently she developed a rash on her left nipple.  The rash is a mild erythematous patch of scaly skin.  Obviously there was concerned about possible Paget's disease given her previous history of breast cancer.  She has had her mammogram/diagnostic mammogram performed along with an ultrasound both which were negative for any malignancy.  They recommended a dermatology consultation.  She is here today for her physical exam.  She mentions this is a concern.  On examination there is an erythematous patch of skin that is slightly pink in color with some fine yellowish-white scale.  Is about 1 cm in diameter and is adjacent to the left nipple at approximately 2 and 3:00.  Exam was performed with a chaperone present.  It does not itch.  It does not burn.  Patient would like to try some cortisone cream for possible eczema or skin irritation prior to seeing a dermatologist or having a biopsy.  I believe that this is reasonable.  Patient is due for a colonoscopy.  Her last colonoscopy was in 2016.  She did have 2 polyps.  She does have a family history of colon cancer in her mother.  Repeat colonoscopy was recommended after 5 years.  We discussed this today.  I have recommended a colonoscopy but she would like to consider it.  She does not require a Pap smear.  Her last bone density test was 2 years ago and showed borderline osteoporosis.  She is on Fosamax, calcium, and vitamin D.  She is due for this again however she did would like to wait until next spring to schedule due to Covid.  Her immunizations are up-to-date except for her Covid booster.  Please see her immunizations and lab work below.  She denies any falls, depression, or memory loss. Immunization History  Administered Date(s) Administered  . DTP 05/24/2007    . Fluad Quad(high Dose 65+) 06/21/2019, 07/18/2020  . Influenza Split 06/29/2012  . Influenza Whole 10/14/1999, 07/29/2007, 07/28/2008, 07/05/2009, 07/26/2010  . Influenza, High Dose Seasonal PF 07/25/2014, 08/02/2015, 06/25/2016, 06/16/2017  . Influenza,inj,Quad PF,6+ Mos 07/05/2013, 06/21/2018  . PFIZER SARS-COV-2 Vaccination 10/25/2019, 11/14/2019  . Pneumococcal Conjugate-13 08/15/2013  . Pneumococcal Polysaccharide-23 10/13/2002, 04/19/2015  . Td 10/13/2006, 05/24/2007  . Zoster 08/05/2016   Most recent lab work is listed below: Lab on 07/16/2020  Component Date Value Ref Range Status  . WBC 07/16/2020 4.7  3.8 - 10.8 Thousand/uL Final  . RBC 07/16/2020 4.65  3.80 - 5.10 Million/uL Final  . Hemoglobin 07/16/2020 13.6  11.7 - 15.5 g/dL Final  . HCT 07/16/2020 42.3  35 - 45 % Final  . MCV 07/16/2020 91.0  80.0 - 100.0 fL Final  . MCH 07/16/2020 29.2  27.0 - 33.0 pg Final  . MCHC 07/16/2020 32.2  32.0 - 36.0 g/dL Final  . RDW 07/16/2020 13.8  11.0 - 15.0 % Final  . Platelets 07/16/2020 190  140 - 400 Thousand/uL Final  . MPV 07/16/2020 11.5  7.5 - 12.5 fL Final  . Neutro Abs 07/16/2020 2,909  1,500 - 7,800 cells/uL Final  . Lymphs Abs 07/16/2020 1,274  850 - 3,900 cells/uL Final  . Absolute Monocytes 07/16/2020 447  200 - 950 cells/uL Final  . Eosinophils Absolute 07/16/2020 52  15 - 500 cells/uL  Final  . Basophils Absolute 07/16/2020 19  0 - 200 cells/uL Final  . Neutrophils Relative % 07/16/2020 61.9  % Final  . Total Lymphocyte 07/16/2020 27.1  % Final  . Monocytes Relative 07/16/2020 9.5  % Final  . Eosinophils Relative 07/16/2020 1.1  % Final  . Basophils Relative 07/16/2020 0.4  % Final  . Glucose, Bld 07/16/2020 97  65 - 99 mg/dL Final   Comment: .            Fasting reference interval .   . BUN 07/16/2020 18  7 - 25 mg/dL Final  . Creat 07/16/2020 1.02* 0.60 - 0.93 mg/dL Final   Comment: For patients >62 years of age, the reference limit for Creatinine is  approximately 13% higher for people identified as African-American. .   . GFR, Est Non African American 07/16/2020 53* > OR = 60 mL/min/1.27m2 Final  . GFR, Est African American 07/16/2020 61  > OR = 60 mL/min/1.74m2 Final  . BUN/Creatinine Ratio 07/16/2020 18  6 - 22 (calc) Final  . Sodium 07/16/2020 142  135 - 146 mmol/L Final  . Potassium 07/16/2020 4.6  3.5 - 5.3 mmol/L Final  . Chloride 07/16/2020 105  98 - 110 mmol/L Final  . CO2 07/16/2020 27  20 - 32 mmol/L Final  . Calcium 07/16/2020 9.3  8.6 - 10.4 mg/dL Final  . Total Protein 07/16/2020 6.8  6.1 - 8.1 g/dL Final  . Albumin 07/16/2020 4.0  3.6 - 5.1 g/dL Final  . Globulin 07/16/2020 2.8  1.9 - 3.7 g/dL (calc) Final  . AG Ratio 07/16/2020 1.4  1.0 - 2.5 (calc) Final  . Total Bilirubin 07/16/2020 0.7  0.2 - 1.2 mg/dL Final  . Alkaline phosphatase (APISO) 07/16/2020 73  37 - 153 U/L Final  . AST 07/16/2020 14  10 - 35 U/L Final  . ALT 07/16/2020 13  6 - 29 U/L Final  . Cholesterol 07/16/2020 131  <200 mg/dL Final  . HDL 07/16/2020 35* > OR = 50 mg/dL Final  . Triglycerides 07/16/2020 122  <150 mg/dL Final  . LDL Cholesterol (Calc) 07/16/2020 76  mg/dL (calc) Final   Comment: Reference range: <100 . Desirable range <100 mg/dL for primary prevention;   <70 mg/dL for patients with CHD or diabetic patients  with > or = 2 CHD risk factors. Marland Kitchen LDL-C is now calculated using the Martin-Hopkins  calculation, which is a validated novel method providing  better accuracy than the Friedewald equation in the  estimation of LDL-C.  Cresenciano Genre et al. Annamaria Helling. 4193;790(24): 2061-2068  (http://education.[facility].com/faq/FAQ164)   . Total CHOL/HDL Ratio 07/16/2020 3.7  <5.0 (calc) Final  . Non-HDL Cholesterol (Calc) 07/16/2020 96  <130 mg/dL (calc) Final   Comment: For patients with diabetes plus 1 major ASCVD risk  factor, treating to a non-HDL-C goal of <100 mg/dL  (LDL-C of <70 mg/dL) is considered a therapeutic  option.   .  Hepatitis C Ab 07/16/2020 NON-REACTIVE  NON-REACTI Final  . SIGNAL TO CUT-OFF 07/16/2020 0.02  <1.00 Final   Comment: . HCV antibody was non-reactive. There is no laboratory  evidence of HCV infection. . In most cases, no further action is required. However, if recent HCV exposure is suspected, a test for HCV RNA (test code [id number]) is suggested. . For additional information please refer to http://education.[facility].com/faq/FAQ22v1 (This link is being provided for informational/ educational purposes only.) .     Past Medical History:  Diagnosis Date  . Allergy   .  Arthritis   . Breast cancer ([place])    right  . 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 Constipation   . Diverticulosis   . Esophagitis   . GERD (gastroesophageal reflux disease)   . Hearing loss   . Hyperlipidemia   . Hypertension   . Irritable bowel syndrome 08/27/2012  . Kidney stones   . Osteopenia   . Palpitations   . Personal history of radiation therapy   . Pneumonia   . Polyp, stomach   . PVC (premature ventricular contraction)   . UTI (lower urinary tract infection)    Past Surgical History:  Procedure Laterality Date  . BREAST LUMPECTOMY Right 07/01/11   w/sentinal node bx - right side  . COLONOSCOPY W/ BIOPSIES    . DILATION AND CURETTAGE OF UTERUS    . ESOPHAGOGASTRODUODENOSCOPY    . kidney stones    . shoulder arthroscopy rt 2009    . SHOULDER SURGERY      Current Outpatient Medications on File Prior to Visit  Medication Sig Dispense Refill  . alendronate (FOSAMAX) 70 MG tablet TAKE 1 TABLET BY MOUTH EVERY 7 DAYS WITH A FULL GLASS OF WATER ON AN EMPTY STOMACH. 12 tablet 3  . atenolol (TENORMIN) 50 MG tablet TAKE 1 TABLET BY MOUTH TWICE A DAY 180 tablet 3  . cephALEXin (KEFLEX) 500 MG capsule Take 1 capsule (500 mg total) by mouth daily as needed. 30 capsule 1  . cholecalciferol (VITAMIN D) 1000 units tablet Take 2,000 Units by mouth daily.    . Coenzyme Q10 (CO Q 10 PO) Take 100 mg by mouth  daily.    Marland Kitchen diltiazem (CARDIZEM) 30 MG tablet Take 1 tablet (30 mg total) by mouth 4 (four) times daily as needed (palpitations). 60 tablet 6  . losartan (COZAAR) 25 MG tablet Take 1 tablet (25 mg total) by mouth daily. 90 tablet 3  . omeprazole (PRILOSEC) 40 MG capsule TAKE 1 CAPSULE BY MOUTH EVERY DAY 90 capsule 3  . OVER THE COUNTER MEDICATION Take 1 tablet by mouth daily. [facility] stool softener daily    . rosuvastatin (CRESTOR) 5 MG tablet TAKE 1 TABLET BY MOUTH EVERY DAY 90 tablet 3  . spironolactone (ALDACTONE) 25 MG tablet Take 1 tablet (25 mg total) by mouth daily. 90 tablet 3   No current facility-administered medications on file prior to visit.   Allergies  Allergen Reactions  . Gatifloxacin     unknown  . Nitrofurantoin Macrocrystal     unknown  . Azithromycin Rash  . Levofloxacin Rash    REACTION: rash REACTION: rash  . Sulfamethoxazole-Trimethoprim Rash and Other (See Comments)    Chills, fever  . Sulfonamide Derivatives Rash and Other (See Comments)    Chills, fever   Social History   Socioeconomic History  . Marital status: Married    Spouse name: Not on file  . Number of children: 2  . Years of education: Not on file  . Highest education level: Not on file  Occupational History  . Occupation: retired  Tobacco Use  . Smoking status: Never Smoker  . Smokeless tobacco: Never Used  Vaping Use  . Vaping Use: Never used  Substance and Sexual Activity  . Alcohol use: No  . Drug use: No  . Sexual activity: Yes  Other Topics Concern  . Not on file  Social History Narrative   Spouse:  [name]   Children:   [name]- age 72- [place]- age  29- [place]   Social Determinants of Radio broadcast assistant Strain:   . Difficulty of Paying Living Expenses: Not on file  Food Insecurity:   . Worried About Charity fundraiser in the Last Year: Not on file  . Ran Out of Food in the Last Year: Not on file  Transportation Needs:   . Lack  of Transportation (Medical): Not on file  . Lack of Transportation (Non-Medical): Not on file  Physical Activity:   . Days of Exercise per Week: Not on file  . Minutes of Exercise per Session: Not on file  Stress:   . Feeling of Stress : Not on file  Social Connections:   . Frequency of Communication with Friends and Family: Not on file  . Frequency of Social Gatherings with Friends and Family: Not on file  . Attends Religious Services: Not on file  . Active Member of Clubs or Organizations: Not on file  . Attends Archivist Meetings: Not on file  . Marital Status: Not on file  Intimate Partner Violence:   . Fear of Current or Ex-Partner: Not on file  . Emotionally Abused: Not on file  . Physically Abused: Not on file  . Sexually Abused: Not on file   Family History  Problem Relation Age of Onset  . Lymphoma Father   . Hodgkin's lymphoma Father   . Colon cancer Mother   . Diabetes Maternal Aunt   . Diabetes Paternal Grandmother   . Colon polyps Neg Hx   . Rectal cancer Neg Hx   . Stomach cancer Neg Hx   . Pancreatic cancer Neg Hx      Review of Systems  All other systems reviewed and are negative.      Objective:   Physical Exam Vitals reviewed.  Constitutional:      General: She is not in acute distress.    Appearance: She is well-developed. She is not diaphoretic.  HENT:     Head: Normocephalic and atraumatic.     Right Ear: External ear normal.     Left Ear: External ear normal.     Nose: Nose normal.  Eyes:     General: No scleral icterus.       Right eye: No discharge.        Left eye: No discharge.     Conjunctiva/sclera: Conjunctivae normal.     Pupils: Pupils are equal, round, and reactive to light.  Neck:     Thyroid: No thyromegaly.     Vascular: No JVD.     Trachea: No tracheal deviation.  Cardiovascular:     Rate and Rhythm: Normal rate and regular rhythm.     Heart sounds: Normal heart sounds. No murmur heard.  No friction rub. No  gallop.   Pulmonary:     Effort: Pulmonary effort is normal. No respiratory distress.     Breath sounds: Normal breath sounds. No stridor. No wheezing or rales.  Chest:     Chest wall: No tenderness.    Abdominal:     General: Bowel sounds are normal. There is no distension.     Palpations: Abdomen is soft. There is no mass.     Tenderness: There is no abdominal tenderness. There is no guarding or rebound.  Musculoskeletal:        General: No tenderness or deformity. Normal range of motion.     Cervical back: Normal range of motion and neck supple.  Lymphadenopathy:  Cervical: No cervical adenopathy.  Skin:    General: Skin is warm.     Coloration: Skin is not pale.     Findings: No erythema or rash.  Neurological:     Mental Status: She is alert and oriented to person, place, and time.     Cranial Nerves: No cranial nerve deficit.     Motor: No abnormal muscle tone.     Coordination: Coordination normal.     Deep Tendon Reflexes: Reflexes are normal and symmetric.  Psychiatric:        Behavior: Behavior normal.        Thought Content: Thought content normal.        Judgment: Judgment normal.           Assessment & Plan:  Medicare annual wellness visit, subsequent  Essential hypertension  Dyslipidemia  History of breast cancer  Osteopenia of right hip   Patient's exam is completely normal.  We discussed her osteopenia.  Patient is taking Fosamax in addition to calcium and vitamin D.  We reviewed her lab work which is excellent except for some mild chronic kidney disease which is stable and low HDL cholesterol.  She denies any falls, depression, or memory loss.  We will try triamcinolone cream twice daily for 10 to 14 days.  If rash persist, I would recommend dermatology consultation.  Blood pressure today is acceptable.  Recommended a colonoscopy but patient would like to consider this first.  She defers a bone density test until next year.  Mammogram is  up-to-date.

## 2020-07-31 ENCOUNTER — Telehealth: Payer: Self-pay | Admitting: Internal Medicine

## 2020-07-31 NOTE — Telephone Encounter (Signed)
Pt is requesting a call back from a nurse to discuss the colonoscopy procedure she is supposed to have.

## 2020-08-01 NOTE — Telephone Encounter (Signed)
Patient reports that  You and she declined to pursue any additional screening procedures.  She discussed with her PCP discussed and she is willing to have a colonoscopy if you feel it would be in her best interest.

## 2020-08-15 DIAGNOSIS — N959 Unspecified menopausal and perimenopausal disorder: Secondary | ICD-10-CM | POA: Diagnosis not present

## 2020-08-15 DIAGNOSIS — Z853 Personal history of malignant neoplasm of breast: Secondary | ICD-10-CM | POA: Diagnosis not present

## 2020-08-15 DIAGNOSIS — N3941 Urge incontinence: Secondary | ICD-10-CM | POA: Diagnosis not present

## 2020-09-04 DIAGNOSIS — L820 Inflamed seborrheic keratosis: Secondary | ICD-10-CM | POA: Diagnosis not present

## 2020-09-04 DIAGNOSIS — L578 Other skin changes due to chronic exposure to nonionizing radiation: Secondary | ICD-10-CM | POA: Diagnosis not present

## 2020-09-04 DIAGNOSIS — L708 Other acne: Secondary | ICD-10-CM | POA: Diagnosis not present

## 2020-09-14 ENCOUNTER — Other Ambulatory Visit: Payer: Self-pay

## 2020-09-14 MED ORDER — OMEPRAZOLE 40 MG PO CPDR: DELAYED_RELEASE_CAPSULE | ORAL | 3 refills | Status: DC

## 2020-09-21 ENCOUNTER — Other Ambulatory Visit: Payer: Self-pay

## 2020-09-21 MED ORDER — OMEPRAZOLE 40 MG PO CPDR: DELAYED_RELEASE_CAPSULE | ORAL | 3 refills | Status: DC

## 2020-10-27 ENCOUNTER — Other Ambulatory Visit: Payer: Self-pay | Admitting: Cardiovascular Disease

## 2020-11-01 ENCOUNTER — Encounter: Payer: Self-pay | Admitting: Internal Medicine

## 2020-11-07 DIAGNOSIS — H524 Presbyopia: Secondary | ICD-10-CM | POA: Diagnosis not present

## 2020-11-07 DIAGNOSIS — Z0100 Encounter for examination of eyes and vision without abnormal findings: Secondary | ICD-10-CM | POA: Diagnosis not present

## 2020-11-23 DIAGNOSIS — B078 Other viral warts: Secondary | ICD-10-CM | POA: Diagnosis not present

## 2020-11-29 ENCOUNTER — Other Ambulatory Visit: Payer: Self-pay | Admitting: Family Medicine

## 2020-12-20 ENCOUNTER — Other Ambulatory Visit: Payer: Self-pay

## 2020-12-20 ENCOUNTER — Ambulatory Visit (AMBULATORY_SURGERY_CENTER): Payer: Medicare HMO

## 2020-12-20 ENCOUNTER — Encounter: Payer: Self-pay | Admitting: Internal Medicine

## 2020-12-20 VITALS — Ht 66.0 in | Wt 190.0 lb

## 2020-12-20 DIAGNOSIS — Z860100 Personal history of colon polyps, unspecified: Secondary | ICD-10-CM

## 2020-12-20 DIAGNOSIS — Z800 Family history of malignant neoplasm of digestive organs: Secondary | ICD-10-CM

## 2020-12-20 NOTE — Progress Notes (Signed)
VIRTUAL PV  No egg or soy allergy known to patient  No issues with past sedation with any surgeries or procedures No intubation problems in the past  No FH of Malignant Hyperthermia No diet pills per patient No home 02 use per patient  No blood thinners per patient  Pt denies issues with constipation  No A fib or A flutter  EMMI video to pt or via [place] 19 guidelines implemented in PV today with Pt and RN  Pt is fully vaccinated  for Covid   Due to the COVID-19 pandemic we are asking patients to follow certain guidelines.  Pt aware of COVID protocols and [facility] guidelines

## 2021-01-01 DIAGNOSIS — C50911 Malignant neoplasm of unspecified site of right female breast: Secondary | ICD-10-CM | POA: Diagnosis not present

## 2021-01-03 ENCOUNTER — Other Ambulatory Visit: Payer: Self-pay

## 2021-01-03 ENCOUNTER — Other Ambulatory Visit: Payer: Self-pay | Admitting: Internal Medicine

## 2021-01-03 ENCOUNTER — Encounter: Payer: Medicare HMO | Admitting: Internal Medicine

## 2021-01-03 ENCOUNTER — Encounter: Payer: Self-pay | Admitting: Internal Medicine

## 2021-01-03 ENCOUNTER — Ambulatory Visit (AMBULATORY_SURGERY_CENTER): Payer: Medicare HMO | Admitting: Internal Medicine

## 2021-01-03 VITALS — BP 139/80 | HR 60 | Temp 96.2°F | Resp 13 | Ht 66.0 in | Wt 190.0 lb

## 2021-01-03 DIAGNOSIS — D125 Benign neoplasm of sigmoid colon: Secondary | ICD-10-CM

## 2021-01-03 DIAGNOSIS — Z853 Personal history of malignant neoplasm of breast: Secondary | ICD-10-CM | POA: Diagnosis not present

## 2021-01-03 DIAGNOSIS — Z860100 Personal history of colon polyps, unspecified: Secondary | ICD-10-CM

## 2021-01-03 DIAGNOSIS — D122 Benign neoplasm of ascending colon: Secondary | ICD-10-CM | POA: Diagnosis not present

## 2021-01-03 DIAGNOSIS — Z800 Family history of malignant neoplasm of digestive organs: Secondary | ICD-10-CM | POA: Diagnosis not present

## 2021-01-03 DIAGNOSIS — I10 Essential (primary) hypertension: Secondary | ICD-10-CM | POA: Diagnosis not present

## 2021-01-03 MED ORDER — SODIUM CHLORIDE 0.9 % IV SOLN: 500.0000 mL | Freq: Once | INTRAVENOUS | Status: AC

## 2021-01-03 NOTE — Op Note (Signed)
[place] Patient Name: Alexandra Calderon Procedure Date: 01/03/2021 10:39 AM MRN: [id number] Endoscopist: Gatha Mayer , MD Age: 80 Referring MD:  Date of Birth: 01-May-1941 Gender: Female Account #: [id number] Procedure:                Colonoscopy Indications:              Surveillance: Personal history of adenomatous                            polyps on last colonoscopy > 5 years ago Medicines:                Propofol per Anesthesia, Monitored Anesthesia Care Procedure:                Pre-Anesthesia Assessment:                           - Prior to the procedure, a History and Physical                            was performed, and patient medications and                            allergies were reviewed. The patient's tolerance of                            previous anesthesia was also reviewed. The risks                            and benefits of the procedure and the sedation                            options and risks were discussed with the patient.                            All questions were answered, and informed consent                            was obtained. Prior Anticoagulants: The patient has                            taken no previous anticoagulant or antiplatelet                            agents. ASA Grade Assessment: II - A patient with                            mild systemic disease. After reviewing the risks                            and benefits, the patient was deemed in                            satisfactory condition to undergo the procedure.  After obtaining informed consent, the colonoscope                            was passed under direct vision. Throughout the                            procedure, the patient's blood pressure, pulse, and                            oxygen saturations were monitored continuously. The                            Olympus CF-HQ190L (Serial# [id number]) Colonoscope was                             introduced through the anus and advanced to the the                            cecum, identified by appendiceal orifice and                            ileocecal valve. The colonoscopy was performed                            without difficulty. The patient tolerated the                            procedure well. The quality of the bowel                            preparation was good. The ileocecal valve,                            appendiceal orifice, and rectum were photographed. Scope In: 10:54:08 AM Scope Out: 48:18:56 AM Scope Withdrawal Time: 0 hours 9 minutes 22 seconds  Total Procedure Duration: 0 hours 12 minutes 41 seconds  Findings:                 The perianal and digital rectal examinations were                            normal.                           Two sessile polyps were found in the sigmoid colon                            and transverse colon. The polyps were diminutive in                            size. These polyps were removed with a cold snare.                            Resection and retrieval were complete. Verification  of patient identification for the specimen was                            done. Estimated blood loss was minimal.                           External hemorrhoids were found.                           The exam was otherwise without abnormality on                            direct and retroflexion views. Complications:            No immediate complications. Estimated Blood Loss:     Estimated blood loss was minimal. Impression:               - Two diminutive polyps in the sigmoid colon and in                            the transverse colon, removed with a cold snare.                            Resected and retrieved.                           - External hemorrhoids.                           - The examination was otherwise normal on direct                            and retroflexion views.                           -  Personal history of colonic polyps. Prior                            adenomas, FHx CRCA also Recommendation:           - Patient has a contact number available for                            emergencies. The signs and symptoms of potential                            delayed complications were discussed with the                            patient. Return to normal activities tomorrow.                            Written discharge instructions were provided to the                            patient.                           -  Resume previous diet.                           - Continue present medications.                           - No repeat colonoscopy due to age. Gatha Mayer, MD 01/03/2021 11:17:08 AM This report has been signed electronically.

## 2021-01-03 NOTE — Patient Instructions (Addendum)
I found and removed 2 tiny polyps - I am sure they are benign but will prove it.  Let's go to as needed colonoscopies and not routine  I appreciate the opportunity to care for you. Gatha Mayer, MD, [facility]  Resume previous diet and medications. No repeat colonoscopy needed.  YOU HAD AN ENDOSCOPIC PROCEDURE TODAY AT [facility]:   Refer to the procedure report that was given to you for any specific questions about what was found during the examination.  If the procedure report does not answer your questions, please call your gastroenterologist to clarify.  If you requested that your care partner not be given the details of your procedure findings, then the procedure report has been included in a sealed envelope for you to review at your convenience later.  YOU SHOULD EXPECT: Some feelings of bloating in the abdomen. Passage of more gas than usual.  Walking can help get rid of the air that was put into your GI tract during the procedure and reduce the bloating. If you had a lower endoscopy (such as a colonoscopy or flexible sigmoidoscopy) you may notice spotting of blood in your stool or on the toilet paper. If you underwent a bowel prep for your procedure, you may not have a normal bowel movement for a few days.  Please Note:  You might notice some irritation and congestion in your nose or some drainage.  This is from the oxygen used during your procedure.  There is no need for concern and it should clear up in a day or so.  SYMPTOMS TO REPORT IMMEDIATELY:   Following lower endoscopy (colonoscopy or flexible sigmoidoscopy):  Excessive amounts of blood in the stool  Significant tenderness or worsening of abdominal pains  Swelling of the abdomen that is new, acute  Fever of 100F or higher   For urgent or emergent issues, a gastroenterologist can be reached at any hour by calling [phone]. Do not use MyChart messaging for urgent concerns.    DIET:  We do recommend a  small meal at first, but then you may proceed to your regular diet.  Drink plenty of fluids but you should avoid alcoholic beverages for 24 hours.  ACTIVITY:  You should plan to take it easy for the rest of today and you should NOT DRIVE or use heavy machinery until tomorrow (because of the sedation medicines used during the test).    FOLLOW UP: Our staff will call the number listed on your records 48-72 hours following your procedure to check on you and address any questions or concerns that you may have regarding the information given to you following your procedure. If we do not reach you, we will leave a message.  We will attempt to reach you two times.  During this call, we will ask if you have developed any symptoms of COVID 19. If you develop any symptoms (ie: fever, flu-like symptoms, shortness of breath, cough etc.) before then, please call [phone].  If you test positive for Covid 19 in the 2 weeks post procedure, please call and report this information to [place].    If any biopsies were taken you will be contacted by phone or by letter within the next 1-3 weeks.  Please call us at [phone] if you have not heard about the biopsies in 3 weeks.    SIGNATURES/CONFIDENTIALITY: You and/or your care partner have signed paperwork which will be entered into your electronic medical record.  These signatures attest  to the fact that that the information above on your After Visit Summary has been reviewed and is understood.  Full responsibility of the confidentiality of this discharge information lies with you and/or your care-partner. 

## 2021-01-03 NOTE — Progress Notes (Signed)
Called to room to assist during endoscopic procedure.  Patient ID and intended procedure confirmed with present staff. Received instructions for my participation in the procedure from the performing physician.  

## 2021-01-03 NOTE — Progress Notes (Signed)
[facility]  Pt's states no medical or surgical changes since previsit or office visit.

## 2021-01-03 NOTE — Progress Notes (Signed)
Report given to PACU, vss 

## 2021-01-07 ENCOUNTER — Telehealth: Payer: Self-pay

## 2021-01-07 NOTE — Telephone Encounter (Signed)
  Follow up Call-  Call back number 01/03/2021  Post procedure Call Back phone  # [phone]  Permission to leave phone message Yes  Some recent data might be hidden     Patient questions:  Do you have a fever, pain , or abdominal swelling? No. Pain Score  0 *  Have you tolerated food without any problems? Yes.    Have you been able to return to your normal activities? Yes.    Do you have any questions about your discharge instructions: Diet   No. Medications  No. Follow up visit  No.  Do you have questions or concerns about your Care? No.  Actions: * If pain score is 4 or above: No action needed, pain <4.    1. Have you developed a fever since your procedure? No   2.   Have you had an respiratory symptoms (SOB or cough) since your procedure? No   3.   Have you tested positive for COVID 19 since your procedure? No   4.   Have you had any family members/close contacts diagnosed with the COVID 19 since your procedure?  No    If yes to any of these questions please route to Joylene John, RN and Joella Prince, RN

## 2021-01-25 ENCOUNTER — Telehealth: Payer: Self-pay | Admitting: Family Medicine

## 2021-01-25 ENCOUNTER — Encounter: Payer: Self-pay | Admitting: Internal Medicine

## 2021-01-25 NOTE — Progress Notes (Signed)
  Chronic Care Management   Outreach Note  01/25/2021 Name: JAZSMINE MACARI MRN: [id number] DOB: March 28, 1941  Referred by: Susy Frizzle, MD Reason for referral : No chief complaint on file.   An unsuccessful telephone outreach was attempted today. The patient was referred to the pharmacist for assistance with care management and care coordination.   Follow Up Plan:   [name]

## 2021-02-01 ENCOUNTER — Telehealth: Payer: Self-pay | Admitting: Family Medicine

## 2021-02-01 NOTE — Progress Notes (Signed)
  Chronic Care Management   Note  02/01/2021 Name: Alexandra Calderon MRN: [id number] DOB: 22-Jan-1941  Alexandra Calderon is a 80 y.o. year old female who is a primary care patient of Dennard Schaumann, Cammie Mcgee, MD. I reached out to Alexandra Calderon by phone today in response to a referral sent by [name] PCP, Susy Frizzle, MD.   [name] was given information about Chronic Care Management services today including:  1. CCM service includes personalized support from designated clinical staff supervised by her physician, including individualized plan of care and coordination with other care providers 2. 24/7 contact phone numbers for assistance for urgent and routine care needs. 3. Service will only be billed when office clinical staff spend 20 minutes or more in a month to coordinate care. 4. Only one practitioner may furnish and bill the service in a calendar month. 5. The patient may stop CCM services at any time (effective at the end of the month) by phone call to the office staff.   Patient agreed to services and verbal consent obtained.   Follow up plan:   [name]

## 2021-02-13 ENCOUNTER — Other Ambulatory Visit: Payer: Medicare HMO

## 2021-02-13 ENCOUNTER — Other Ambulatory Visit: Payer: Self-pay

## 2021-02-13 DIAGNOSIS — Z13220 Encounter for screening for lipoid disorders: Secondary | ICD-10-CM | POA: Diagnosis not present

## 2021-02-13 DIAGNOSIS — I10 Essential (primary) hypertension: Secondary | ICD-10-CM | POA: Diagnosis not present

## 2021-02-13 DIAGNOSIS — E785 Hyperlipidemia, unspecified: Secondary | ICD-10-CM

## 2021-02-13 DIAGNOSIS — Z136 Encounter for screening for cardiovascular disorders: Secondary | ICD-10-CM | POA: Diagnosis not present

## 2021-02-14 LAB — COMPLETE METABOLIC PANEL WITHOUT GFR
AG Ratio: 1.5 (calc) (ref 1.0–2.5)
ALT: 11 U/L (ref 6–29)
AST: 15 U/L (ref 10–35)
Albumin: 4.1 g/dL (ref 3.6–5.1)
Alkaline phosphatase (APISO): 72 U/L (ref 37–153)
BUN/Creatinine Ratio: 16 (calc) (ref 6–22)
BUN: 16 mg/dL (ref 7–25)
CO2: 30 mmol/L (ref 20–32)
Calcium: 9.3 mg/dL (ref 8.6–10.4)
Chloride: 104 mmol/L (ref 98–110)
Creat: 0.99 mg/dL — ABNORMAL HIGH (ref 0.60–0.93)
GFR, Est African American: 63 mL/min/{1.73_m2}
GFR, Est Non African American: 55 mL/min/{1.73_m2} — ABNORMAL LOW
Globulin: 2.8 g/dL (ref 1.9–3.7)
Glucose, Bld: 101 mg/dL — ABNORMAL HIGH (ref 65–99)
Potassium: 4.8 mmol/L (ref 3.5–5.3)
Sodium: 142 mmol/L (ref 135–146)
Total Bilirubin: 0.7 mg/dL (ref 0.2–1.2)
Total Protein: 6.9 g/dL (ref 6.1–8.1)

## 2021-02-14 LAB — CBC WITH DIFFERENTIAL/PLATELET
Absolute Monocytes: 546 {cells}/uL (ref 200–950)
Basophils Absolute: 21 {cells}/uL (ref 0–200)
Basophils Relative: 0.4 %
Eosinophils Absolute: 42 {cells}/uL (ref 15–500)
Eosinophils Relative: 0.8 %
HCT: 42 % (ref 35.0–45.0)
Hemoglobin: 13.4 g/dL (ref 11.7–15.5)
Lymphs Abs: 1341 {cells}/uL (ref 850–3900)
MCH: 29.2 pg (ref 27.0–33.0)
MCHC: 31.9 g/dL — ABNORMAL LOW (ref 32.0–36.0)
MCV: 91.5 fL (ref 80.0–100.0)
MPV: 11.4 fL (ref 7.5–12.5)
Monocytes Relative: 10.3 %
Neutro Abs: 3350 {cells}/uL (ref 1500–7800)
Neutrophils Relative %: 63.2 %
Platelets: 198 10*3/uL (ref 140–400)
RBC: 4.59 Million/uL (ref 3.80–5.10)
RDW: 13.9 % (ref 11.0–15.0)
Total Lymphocyte: 25.3 %
WBC: 5.3 10*3/uL (ref 3.8–10.8)

## 2021-02-14 LAB — LIPID PANEL
Cholesterol: 134 mg/dL
HDL: 37 mg/dL — ABNORMAL LOW
LDL Cholesterol (Calc): 76 mg/dL
Non-HDL Cholesterol (Calc): 97 mg/dL
Total CHOL/HDL Ratio: 3.6 (calc)
Triglycerides: 129 mg/dL

## 2021-03-04 ENCOUNTER — Other Ambulatory Visit: Payer: Self-pay

## 2021-03-04 ENCOUNTER — Encounter: Payer: Self-pay | Admitting: Nurse Practitioner

## 2021-03-04 ENCOUNTER — Ambulatory Visit (INDEPENDENT_AMBULATORY_CARE_PROVIDER_SITE_OTHER): Payer: Medicare HMO | Admitting: Nurse Practitioner

## 2021-03-04 VITALS — BP 124/78 | HR 78 | Temp 98.1°F | Ht 66.0 in | Wt 188.0 lb

## 2021-03-04 DIAGNOSIS — R319 Hematuria, unspecified: Secondary | ICD-10-CM | POA: Diagnosis not present

## 2021-03-04 LAB — URINALYSIS, ROUTINE W REFLEX MICROSCOPIC
Bilirubin Urine: POSITIVE — AB
Hyaline Cast: NONE SEEN /LPF
Leukocytes,Ua: NEGATIVE
Nitrite: POSITIVE — AB
Specific Gravity, Urine: 1.032 (ref 1.001–1.035)
pH: 5.5 (ref 5.0–8.0)

## 2021-03-04 LAB — MICROSCOPIC MESSAGE

## 2021-03-04 NOTE — Progress Notes (Signed)
Subjective:    Patient ID: Alexandra Calderon, female    DOB: 06-10-1941, 80 y.o.   MRN: [id number]  HPI: Alexandra Calderon is a 80 y.o. female presenting for blood in urine.  Chief Complaint  Patient presents with  . Hematuria    Has been happening for 66yrs, having some oder in urine with vivid show of blood in the urine.usually takes cephalaxin for this reocurring issue   URINARY SYMPTOMS Patient reports long history of urinary tract infections.  In the past, she has followed with urology and gynecology for the same.  She has a prescription of Keflex that she is able to use as needed for when her urinary tract symptoms start.  She has not taken the cephalexin for her symptoms at this time, she wanted to be tested first. Duration: 2-3 weeks Dysuria: no Urinary frequency: no Urgency: no Small volume voids: yes Symptom severity: mild Urinary incontinence: no Foul odor: yes Hematuria: yes; started today Abdominal pain: no Back pain: no Suprapubic pain/pressure: no Flank pain: no Fever:  Low grade - 99 last night Nausea: no Vomiting: no Relief with cranberry juice: no Relief with pyridium: no Status: worse Previous urinary tract infection: yes Recurrent urinary tract infection: no Sexual activity: currently esxually active with 1 female partner History of sexually transmitted disease: no Vaginal discharge: no Treatments attempted: increaseing fluids   Allergies  Allergen Reactions  . Gatifloxacin     unknown  . Nitrofurantoin Macrocrystal     unknown  . Azithromycin Rash  . Levofloxacin Rash    REACTION: rash REACTION: rash  . Sulfamethoxazole-Trimethoprim Rash and Other (See Comments)    Chills, fever  . Sulfonamide Derivatives Rash and Other (See Comments)    Chills, fever    Outpatient Encounter Medications as of 03/04/2021  Medication Sig  . alendronate (FOSAMAX) 70 MG tablet TAKE 1 TABLET BY MOUTH EVERY 7 DAYS WITH A FULL GLASS OF WATER ON AN EMPTY STOMACH.  Marland Kitchen  atenolol (TENORMIN) 50 MG tablet TAKE 1 TABLET BY MOUTH TWICE A DAY  . cephALEXin (KEFLEX) 500 MG capsule Take 1 capsule (500 mg total) by mouth daily as needed.  . cholecalciferol (VITAMIN D) 1000 units tablet Take 2,000 Units by mouth daily.  . Coenzyme Q10 (CO Q 10 PO) Take 100 mg by mouth daily.  Marland Kitchen losartan (COZAAR) 25 MG tablet Take 1 tablet (25 mg total) by mouth daily.  Marland Kitchen omeprazole (PRILOSEC) 40 MG capsule TAKE 1 CAPSULE BY MOUTH EVERY DAY  . OVER THE COUNTER MEDICATION Take 1 tablet by mouth daily. [facility] stool softener daily  . rosuvastatin (CRESTOR) 5 MG tablet TAKE 1 TABLET BY MOUTH EVERY DAY  . spironolactone (ALDACTONE) 25 MG tablet TAKE 1 TABLET BY MOUTH EVERY DAY   Facility-Administered Encounter Medications as of 03/04/2021  Medication  . 0.9 %  sodium chloride infusion    Patient Active Problem List   Diagnosis Date Noted  . PVC's (premature ventricular contractions) 02/10/2017  . Irritable bowel syndrome 08/27/2012  . Breast cancer of upper-inner quadrant of right female breast ([facility]) 08/18/2011  . Malignant tumor of breast ([facility]) 10/13/2010  . CYSTITIS, CHRONIC 03/29/2010  . PALPITATIONS 09/19/2009  . GERD 11/27/2008  . Osteoarthritis 08/28/2008  . ROTATOR CUFF TEAR 05/18/2008  . Dyslipidemia 07/01/2007  . Essential hypertension 04/07/2007  . Disorder of bone and cartilage 04/07/2007    Past Medical History:  Diagnosis Date  . Allergy   . Arthritis   . Breast cancer (  [place])    right  . Cataract    bilateral  . Colon adenomas 6301,6010  . Constipation   . Diverticulosis   . Esophagitis   . GERD (gastroesophageal reflux disease)   . Hearing loss   . Hyperlipidemia   . Hypertension   . Irritable bowel syndrome 08/27/2012  . Kidney stones   . Osteopenia   . Palpitations   . Personal history of radiation therapy   . Pneumonia   . Polyp, stomach   . PVC (premature ventricular contraction)   . UTI (lower urinary tract infection)    Relevant past  medical, surgical, family and social history reviewed and updated as indicated. Interim medical history since our last visit reviewed.  Review of Systems Per HPI unless specifically indicated above     Objective:    BP 124/78   Pulse 78   Temp 98.1 F (36.7 C)   Ht 5\' 6"  (1.676 m)   Wt 188 lb (85.3 kg)   LMP  (LMP Unknown)   SpO2 97%   BMI 30.34 kg/m   Wt Readings from Last 3 Encounters:  03/04/21 188 lb (85.3 kg)  01/03/21 190 lb (86.2 kg)  12/20/20 190 lb (86.2 kg)    Physical Exam Vitals and nursing note reviewed.  Constitutional:      General: She is not in acute distress.    Appearance: Normal appearance. She is not toxic-appearing.  HENT:     Head: Normocephalic and atraumatic.  Eyes:     General: No scleral icterus.    Extraocular Movements: Extraocular movements intact.  Abdominal:     General: Abdomen is flat. Bowel sounds are normal. There is no distension.     Palpations: Abdomen is soft. There is no mass.     Tenderness: There is no abdominal tenderness. There is no right CVA tenderness, left CVA tenderness or guarding.  Musculoskeletal:     Right lower leg: No edema.     Left lower leg: No edema.  Skin:    General: Skin is warm and dry.     Capillary Refill: Capillary refill takes less than 2 seconds.     Coloration: Skin is not jaundiced or pale.     Findings: No erythema.  Neurological:     Mental Status: She is alert and oriented to person, place, and time.     Motor: No weakness.     Gait: Gait normal.  Psychiatric:        Mood and Affect: Mood normal.        Behavior: Behavior normal.        Thought Content: Thought content normal.        Judgment: Judgment normal.       Assessment & Plan:  1. Hematuria, unspecified type Acute.  Urinalysis today shows trace glucose, positive bilirubin, 1+ protein, nitrite positive, and 10-20 white blood cells with many bacteria.  Encourage patient to start cephalexin 500 mg 4 times daily for 5 days.  Plans  to follow-up with PCP tomorrow for questionable gallbladder pain-does not wish to discuss this with me today.  Will likely benefit from metabolic panel testing given glucose urea and bilirubin found in urine.  We will send urine for culture while treating for UTI with Keflex.  No CVA tenderness today.  Follow-up if symptoms persist or worsen.  - Urinalysis, Routine w reflex microscopic - Urine Culture     Follow up plan: Return if symptoms worsen or fail to improve.

## 2021-03-05 ENCOUNTER — Ambulatory Visit (INDEPENDENT_AMBULATORY_CARE_PROVIDER_SITE_OTHER): Payer: Medicare HMO | Admitting: Family Medicine

## 2021-03-05 ENCOUNTER — Encounter: Payer: Self-pay | Admitting: Family Medicine

## 2021-03-05 ENCOUNTER — Other Ambulatory Visit: Payer: Self-pay

## 2021-03-05 VITALS — BP 136/76 | HR 72 | Temp 98.5°F | Resp 16 | Ht 66.0 in | Wt 192.0 lb

## 2021-03-05 DIAGNOSIS — K8050 Calculus of bile duct without cholangitis or cholecystitis without obstruction: Secondary | ICD-10-CM | POA: Diagnosis not present

## 2021-03-05 NOTE — Progress Notes (Signed)
Subjective:    Patient ID: Alexandra Calderon, female    DOB: 05/15/1941, 80 y.o.   MRN: [id number]  Patient had a CT scan in July of last year that showed sludge in her gallbladder.  She states that she has been doing fine until this weekend.  This weekend she developed epigastric pain that radiated into the right upper quadrant and then radiated through to her subscapular area and her back.  The pain was intense and lasted approximately 1 hour to 2 hours and then subsided on its own.  She denies any melena or hematochezia.  She denies any nausea or vomiting.  She denies any constipation.  She denies any hematuria or dysuria.  She has been pain-free ever since.  Past Medical History:  Diagnosis Date  . Allergy   . Arthritis   . Breast cancer ([facility])    right  . Colon adenomas 1308,6578  . Constipation   . Diverticulosis   . Esophagitis   . GERD (gastroesophageal reflux disease)   . Hearing loss   . Hyperlipidemia   . Hypertension   . Irritable bowel syndrome 08/27/2012  . Kidney stones   . Osteopenia   . Palpitations   . Personal history of radiation therapy   . Pneumonia   . Polyp, stomach   . PVC (premature ventricular contraction)   . UTI (lower urinary tract infection)    Past Surgical History:  Procedure Laterality Date  . BREAST LUMPECTOMY Right 07/01/11   w/sentinal node bx - right side  . COLONOSCOPY W/ BIOPSIES    . DILATION AND CURETTAGE OF UTERUS    . ESOPHAGOGASTRODUODENOSCOPY    . kidney stones    . shoulder arthroscopy rt 2009    . SHOULDER SURGERY     Current Outpatient Medications on File Prior to Visit  Medication Sig Dispense Refill  . ALPRAZolam (XANAX) 0.25 MG tablet Take 1 tablet (0.25 mg total) by mouth at bedtime as needed for anxiety or sleep. 90 tablet 1  . atenolol (TENORMIN) 50 MG tablet TAKE 2 TABLETS BY MOUTH EVERY DAY 180 tablet 1  . cholecalciferol (VITAMIN D) 1000 units tablet Take 2,000 Units by mouth daily.    . Coenzyme Q10 (CO Q 10 PO)  Take 100 mg by mouth daily.    . famotidine (PEPCID) 20 MG tablet TAKE 1 TABLET BY MOUTH TWICE A DAY 180 tablet 1  . losartan (COZAAR) 25 MG tablet Take 25 mg by mouth daily.    Marland Kitchen omeprazole (PRILOSEC) 40 MG capsule Take 1 capsule (40 mg total) by mouth daily. 30 capsule 3  . OVER THE COUNTER MEDICATION Take 1 tablet by mouth daily. [facility] stool softener daily    . simvastatin (ZOCOR) 20 MG tablet TAKE 1 TABLET BY MOUTH EVERY DAY 90 tablet 1  . spironolactone (ALDACTONE) 25 MG tablet Take 1 tablet (25 mg total) by mouth daily. 90 tablet 3   No current facility-administered medications on file prior to visit.    Allergies  Allergen Reactions  . Gatifloxacin     unknown  . Levofloxacin     REACTION: rash  . Nitrofurantoin Macrocrystal     unknown  . Sulfamethoxazole-Trimethoprim Rash and Other (See Comments)    Chills, fever  . Sulfonamide Derivatives Rash and Other (See Comments)    Chills, fever   Socioeconomic History  . Marital status: Married    Spouse name: Not on file  . Number of children: 2  . Years of  education: Not on file  . Highest education level: Not on file  Social Needs  . Financial resource strain: Not on file  . Food insecurity - worry: Not on file  . Food insecurity - inability: Not on file  . Transportation needs - medical: Not on file  . Transportation needs - non-medical: Not on file  Occupational History  . Occupation: retired  Tobacco Use  . Smoking status: Never Smoker  . Smokeless tobacco: Never Used  Substance and Sexual Activity  . Alcohol use: No  . Drug use: No  . Sexual activity: Yes  Other Topics Concern  . Not on file  Social History Narrative   Spouse:  [name]   Children:   [name]- age 69- [place]- age 6- [place]      Review of Systems  Musculoskeletal: Positive for back pain.  All other systems reviewed and are negative.      Objective:    Vital signs are reviewed and are normal.   Cardiovascular is regular rate and rhythm with no murmurs rubs or gallops.  Pulmonary exam is normal.  Abdomen is soft, nondistended, nontender.     Assessment & Plan:  Biliary colic - Plan: CBC with Differential/Platelet, COMPLETE METABOLIC PANEL WITH GFR, Lipase  Schedule the patient for right upper quadrant ultrasound to confirm the diagnosis.  However I believe most of her symptoms are due to gallstones.  If gallstones are confirmed on her ultrasound, I will schedule the patient to meet with a surgeon to discuss cholecystectomy.  Meanwhile check CBC CMP and lipase.

## 2021-03-06 LAB — COMPLETE METABOLIC PANEL WITHOUT GFR
AG Ratio: 1.3 (calc) (ref 1.0–2.5)
ALT: 393 U/L — ABNORMAL HIGH (ref 6–29)
AST: 215 U/L — ABNORMAL HIGH (ref 10–35)
Albumin: 4 g/dL (ref 3.6–5.1)
Alkaline phosphatase (APISO): 230 U/L — ABNORMAL HIGH (ref 37–153)
BUN/Creatinine Ratio: 16 (calc) (ref 6–22)
BUN: 15 mg/dL (ref 7–25)
CO2: 29 mmol/L (ref 20–32)
Calcium: 9.1 mg/dL (ref 8.6–10.4)
Chloride: 107 mmol/L (ref 98–110)
Creat: 0.96 mg/dL — ABNORMAL HIGH (ref 0.60–0.93)
GFR, Est African American: 66 mL/min/{1.73_m2}
GFR, Est Non African American: 57 mL/min/{1.73_m2} — ABNORMAL LOW
Globulin: 3 g/dL (ref 1.9–3.7)
Glucose, Bld: 108 mg/dL — ABNORMAL HIGH (ref 65–99)
Potassium: 4.6 mmol/L (ref 3.5–5.3)
Sodium: 145 mmol/L (ref 135–146)
Total Bilirubin: 1 mg/dL (ref 0.2–1.2)
Total Protein: 7 g/dL (ref 6.1–8.1)

## 2021-03-06 LAB — CBC WITH DIFFERENTIAL/PLATELET
Absolute Monocytes: 691 {cells}/uL (ref 200–950)
Basophils Absolute: 22 {cells}/uL (ref 0–200)
Basophils Relative: 0.4 %
Eosinophils Absolute: 92 {cells}/uL (ref 15–500)
Eosinophils Relative: 1.7 %
HCT: 40.9 % (ref 35.0–45.0)
Hemoglobin: 13.3 g/dL (ref 11.7–15.5)
Lymphs Abs: 1755 {cells}/uL (ref 850–3900)
MCH: 29.6 pg (ref 27.0–33.0)
MCHC: 32.5 g/dL (ref 32.0–36.0)
MCV: 90.9 fL (ref 80.0–100.0)
MPV: 11.3 fL (ref 7.5–12.5)
Monocytes Relative: 12.8 %
Neutro Abs: 2840 {cells}/uL (ref 1500–7800)
Neutrophils Relative %: 52.6 %
Platelets: 198 10*3/uL (ref 140–400)
RBC: 4.5 Million/uL (ref 3.80–5.10)
RDW: 13.5 % (ref 11.0–15.0)
Total Lymphocyte: 32.5 %
WBC: 5.4 10*3/uL (ref 3.8–10.8)

## 2021-03-06 LAB — URINE CULTURE
MICRO NUMBER:: 11922535
SPECIMEN QUALITY:: ADEQUATE

## 2021-03-06 LAB — LIPASE: Lipase: 55 U/L (ref 7–60)

## 2021-03-07 ENCOUNTER — Other Ambulatory Visit: Payer: Self-pay | Admitting: *Deleted

## 2021-03-07 DIAGNOSIS — K8050 Calculus of bile duct without cholangitis or cholecystitis without obstruction: Secondary | ICD-10-CM

## 2021-03-07 DIAGNOSIS — H524 Presbyopia: Secondary | ICD-10-CM | POA: Diagnosis not present

## 2021-03-07 DIAGNOSIS — H25813 Combined forms of age-related cataract, bilateral: Secondary | ICD-10-CM | POA: Diagnosis not present

## 2021-03-07 DIAGNOSIS — R7989 Other specified abnormal findings of blood chemistry: Secondary | ICD-10-CM

## 2021-03-07 NOTE — Addendum Note (Signed)
Addended by: Sheral Flow on: 03/07/2021 12:15 PM   Modules accepted: Orders

## 2021-03-07 NOTE — Addendum Note (Signed)
Addended by: Sheral Flow on: 03/07/2021 10:27 AM   Modules accepted: Orders

## 2021-03-08 ENCOUNTER — Other Ambulatory Visit: Payer: Self-pay | Admitting: *Deleted

## 2021-03-08 ENCOUNTER — Other Ambulatory Visit: Payer: Self-pay

## 2021-03-08 ENCOUNTER — Telehealth: Payer: Self-pay | Admitting: *Deleted

## 2021-03-08 ENCOUNTER — Encounter: Payer: Self-pay | Admitting: Family Medicine

## 2021-03-08 ENCOUNTER — Ambulatory Visit (HOSPITAL_BASED_OUTPATIENT_CLINIC_OR_DEPARTMENT_OTHER)
Admission: RE | Admit: 2021-03-08 | Discharge: 2021-03-08 | Disposition: A | Discharge status: Home / Self Care | Payer: Medicare HMO | Source: Ambulatory Visit | Attending: Family Medicine | Admitting: Family Medicine

## 2021-03-08 DIAGNOSIS — K8050 Calculus of bile duct without cholangitis or cholecystitis without obstruction: Secondary | ICD-10-CM | POA: Diagnosis present

## 2021-03-08 DIAGNOSIS — K8020 Calculus of gallbladder without cholecystitis without obstruction: Secondary | ICD-10-CM | POA: Diagnosis not present

## 2021-03-08 DIAGNOSIS — K760 Fatty (change of) liver, not elsewhere classified: Secondary | ICD-10-CM | POA: Diagnosis not present

## 2021-03-08 DIAGNOSIS — K769 Liver disease, unspecified: Secondary | ICD-10-CM

## 2021-03-08 DIAGNOSIS — R7989 Other specified abnormal findings of blood chemistry: Secondary | ICD-10-CM | POA: Diagnosis present

## 2021-03-08 DIAGNOSIS — R1013 Epigastric pain: Secondary | ICD-10-CM | POA: Diagnosis not present

## 2021-03-08 NOTE — Telephone Encounter (Signed)
Received call from Imaging Department at [facility].   Reports [place] has been completed and results are as follows: IMPRESSION: 1. Cholelithiasis without sonographic evidence of acute cholecystitis. 2. Indeterminate hypoechoic lesion in the left lobe of the liver, possibly complex cyst. MRI may provide better characterization. 3. Fatty liver.  PCP made aware and recommendations are as follows: Gallbladder has multiple stones. Consult general surgery to discuss removal. Possible stone passed and caused elevation in liver function.   Order MRI for lesion in liver.   Non-alcoholic fatty liver disease (NAFLD) is the build up of extra fat in liver cells that is not caused by alcohol. It is normal for the liver to contain some fat. However, if more than 5% - 10% percent of the liver's weight is fat, then it is called a fatty liver (steatosis). NAFLD tends to develop in people who are overweight or obese or have diabetes, high cholesterol or high triglycerides. Rapid weight loss and poor eating habits also may lead to NAFLD. There are no medical treatments yet for NAFLD. Eating a healthy diet and exercising regularly may help prevent liver damage from starting or reverse it in the early stages. Try to avoid fried foods and fatty foods (red meats, pork products, whole milk products, and egg yolks) and try to eat more fresh fruits and vegetables.   Repeat CMP in 1 week.   Call placed to patient and patient made aware.   Orders placed.

## 2021-03-14 ENCOUNTER — Telehealth: Payer: Self-pay | Admitting: Pharmacist

## 2021-03-14 ENCOUNTER — Ambulatory Visit (INDEPENDENT_AMBULATORY_CARE_PROVIDER_SITE_OTHER): Payer: Medicare HMO | Admitting: Pharmacist

## 2021-03-14 ENCOUNTER — Other Ambulatory Visit: Payer: Self-pay

## 2021-03-14 ENCOUNTER — Other Ambulatory Visit: Payer: Medicare HMO

## 2021-03-14 DIAGNOSIS — K219 Gastro-esophageal reflux disease without esophagitis: Secondary | ICD-10-CM

## 2021-03-14 DIAGNOSIS — E785 Hyperlipidemia, unspecified: Secondary | ICD-10-CM

## 2021-03-14 DIAGNOSIS — I10 Essential (primary) hypertension: Secondary | ICD-10-CM | POA: Diagnosis not present

## 2021-03-14 DIAGNOSIS — R7989 Other specified abnormal findings of blood chemistry: Secondary | ICD-10-CM

## 2021-03-14 DIAGNOSIS — K8050 Calculus of bile duct without cholangitis or cholecystitis without obstruction: Secondary | ICD-10-CM

## 2021-03-14 NOTE — Progress Notes (Addendum)
    Chronic Care Management Pharmacy Assistant   Name: Alexandra Calderon  MRN: [id number] DOB: [birth year]  Reason for Encounter: Chart Prep  Medications: Outpatient Encounter Medications as of 03/14/2021  Medication Sig   alendronate (FOSAMAX) 70 MG tablet TAKE 1 TABLET BY MOUTH EVERY 7 DAYS WITH A FULL GLASS OF WATER ON AN EMPTY STOMACH.   atenolol (TENORMIN) 50 MG tablet TAKE 1 TABLET BY MOUTH TWICE A DAY   cephALEXin (KEFLEX) 500 MG capsule Take 1 capsule (500 mg total) by mouth daily as needed. (Patient not taking: Reported on 03/05/2021)   cholecalciferol (VITAMIN D) 1000 units tablet Take 2,000 Units by mouth daily.   Coenzyme Q10 (CO Q 10 PO) Take 100 mg by mouth daily.   losartan (COZAAR) 25 MG tablet Take 1 tablet (25 mg total) by mouth daily.   omeprazole (PRILOSEC) 40 MG capsule TAKE 1 CAPSULE BY MOUTH EVERY DAY   OVER THE COUNTER MEDICATION Take 1 tablet by mouth daily. [facility] stool softener daily   rosuvastatin (CRESTOR) 5 MG tablet TAKE 1 TABLET BY MOUTH EVERY DAY   spironolactone (ALDACTONE) 25 MG tablet TAKE 1 TABLET BY MOUTH EVERY DAY   Facility-Administered Encounter Medications as of 03/14/2021  Medication   0.9 %  sodium chloride infusion   Have you seen any other providers since your last visit?  Patient stated no.  Any changes in your medications or health? Patient stated no.  Any side effects from any medications? Patient state no.   Do you have an symptoms or problems not managed by your medications? Patient stated no.  Any concerns about your health right now? Patient stated no.  Has your provider asked that you check blood pressure, blood sugar, or follow special diet at home? Patient stated no.  Do you get any type of exercise on a regular basis? Patient stated she walks everyday for about 10-15 min  Can you think of a goal you would like to reach for your health? Patient stated no.  Do you have any problems getting your medications? Patient stated  no.  Is there anything that you would like to discuss during the appointment? Patient stated no.  Please bring medications and supplements to appointment, patient reminded of her OTP appointment on 03/14/21 at 9 am.   [place], [place] Pharmacist Assistant [phone]

## 2021-03-14 NOTE — Progress Notes (Signed)
Chronic Care Management Pharmacy Note  03/15/2021 Name:  Alexandra Calderon MRN:  [id number] DOB:  1941-04-22  Summary: General medication review and CCM visit with PharmD.  Patient coming in today for liver testing.  Recommendations/Changes made from today's visit: No changes made at today's visit  Plan: LFTs - resume Crestor if these return normal  Subjective: Alexandra Calderon is an 80 y.o. year old female who is a primary patient of Pickard, Cammie Mcgee, MD.  The CCM team was consulted for assistance with disease management and care coordination needs.    Engaged with patient by telephone for initial visit in response to provider referral for pharmacy case management and/or care coordination services.   Consent to Services:  The patient was given the following information about Chronic Care Management services today, agreed to services, and gave verbal consent: 1. CCM service includes personalized support from designated clinical staff supervised by the primary care provider, including individualized plan of care and coordination with other care providers 2. 24/7 contact phone numbers for assistance for urgent and routine care needs. 3. Service will only be billed when office clinical staff spend 20 minutes or more in a month to coordinate care. 4. Only one practitioner may furnish and bill the service in a calendar month. 5.The patient may stop CCM services at any time (effective at the end of the month) by phone call to the office staff. 6. The patient will be responsible for cost sharing (co-pay) of up to 20% of the service fee (after annual deductible is met). Patient agreed to services and consent obtained.  Patient Care Team: Susy Frizzle, MD as PCP - General (Family Medicine) Nahser, Wonda Cheng, MD as PCP - Cardiology (Cardiology) [name], [facility] as Pharmacist (Pharmacist)  Recent office visits: 03/05/21 Dennard Schaumann) - scheduled for R upper quadrant [place] fo confirm diagnosis of  gallstones.  Confirmed diagnosis of multiple stones.  Patient was also instructed to hold Crestor.  03/04/21 Edsel Petrin) - given Keflex ABX to take for trace glucose in urine and bacteria. To follow up with PCP for gallbladder pain.  Recent consult visits: None recent  Hospital visits: None in previous 6 months   Objective:  Lab Results  Component Value Date   CREATININE 0.95 (H) 03/14/2021   BUN 19 03/14/2021   GFR 63.38 04/29/2017   GFRNONAA 57 (L) 03/14/2021   GFRAA 66 03/14/2021   NA 141 03/14/2021   K 4.3 03/14/2021   CALCIUM 9.4 03/14/2021   CO2 30 03/14/2021   GLUCOSE 91 03/14/2021    Lab Results  Component Value Date/Time   HGBA1C 6.0 12/13/2012 11:04 AM   GFR 63.38 04/29/2017 08:00 AM   GFR 71.58 04/23/2016 08:05 AM    Last diabetic Eye exam: No results found for: HMDIABEYEEXA  Last diabetic Foot exam: No results found for: HMDIABFOOTEX   Lab Results  Component Value Date   CHOL 134 02/13/2021   HDL 37 (L) 02/13/2021   LDLCALC 76 02/13/2021   TRIG 129 02/13/2021   CHOLHDL 3.6 02/13/2021    Hepatic Function Latest Ref Rng & Units 03/14/2021 03/05/2021 02/13/2021  Total Protein 6.1 - 8.1 g/dL 7.0 7.0 6.9  Albumin 3.5 - 5.2 g/dL - - -  AST 10 - 35 U/L 18 215(H) 15  ALT 6 - 29 U/L 44(H) 393(H) 11  Alk Phosphatase 39 - 117 U/L - - -  Total Bilirubin 0.2 - 1.2 mg/dL 0.7 1.0 0.7  Bilirubin, Direct 0.0 - 0.3 mg/dL - - -  Lab Results  Component Value Date/Time   TSH 4.49 04/29/2017 08:00 AM   TSH 2.280 02/10/2017 10:10 AM   FREET4 1.0 09/19/2009 12:00 AM    CBC Latest Ref Rng & Units 03/05/2021 02/13/2021 07/16/2020  WBC 3.8 - 10.8 Thousand/uL 5.4 5.3 4.7  Hemoglobin 11.7 - 15.5 g/dL 13.3 13.4 13.6  Hematocrit 35.0 - 45.0 % 40.9 42.0 42.3  Platelets 140 - 400 Thousand/uL 198 198 190    Lab Results  Component Value Date/Time   VD25OH 62 10/19/2013 09:04 AM   VD25OH 70 10/07/2012 08:59 AM    Clinical ASCVD: No  The 10-year ASCVD risk score Mikey Bussing DC  Jr., et al., 2013) is: 30.2%   Values used to calculate the score:     Age: 30 years     Sex: Female     Is Non-Hispanic African American: No     Diabetic: No     Tobacco smoker: No     Systolic Blood Pressure: 657 mmHg     Is BP treated: Yes     HDL Cholesterol: 37 mg/dL     Total Cholesterol: 134 mg/dL    Depression screen [facility] 2/9 07/23/2020 07/21/2019 06/21/2018  Decreased Interest 0 0 0  Down, Depressed, Hopeless 0 0 0  PHQ - 2 Score 0 0 0  Some recent data might be hidden     Social History   Tobacco Use  Smoking Status Never Smoker  Smokeless Tobacco Never Used   BP Readings from Last 3 Encounters:  03/05/21 136/76  03/04/21 124/78  01/03/21 139/80   Pulse Readings from Last 3 Encounters:  03/05/21 72  03/04/21 78  01/03/21 60   Wt Readings from Last 3 Encounters:  03/05/21 192 lb (87.1 kg)  03/04/21 188 lb (85.3 kg)  01/03/21 190 lb (86.2 kg)   BMI Readings from Last 3 Encounters:  03/05/21 30.99 kg/m  03/04/21 30.34 kg/m  01/03/21 30.67 kg/m    Assessment/Interventions: Review of patient past medical history, allergies, medications, health status, including review of consultants reports, laboratory and other test data, was performed as part of comprehensive evaluation and provision of chronic care management services.   SDOH:  (Social Determinants of Health) assessments and interventions performed: Yes   Financial Resource Strain: Low Risk   . Difficulty of Paying Living Expenses: Not very hard    SDOH Screenings   Alcohol Screen: Low Risk   . Last Alcohol Screening Score (AUDIT): 0  Depression (PHQ2-9): Low Risk   . PHQ-2 Score: 0  Financial Resource Strain: Low Risk   . Difficulty of Paying Living Expenses: Not very hard  Food Insecurity: Not on file  Housing: Not on file  Physical Activity: Not on file  Social Connections: Not on file  Stress: Not on file  Tobacco Use: Low Risk   . Smoking Tobacco Use: Never Smoker  . Smokeless Tobacco  Use: Never Used  Transportation Needs: Not on file    CCM Care Plan  Allergies  Allergen Reactions  . Gatifloxacin     unknown  . Nitrofurantoin Macrocrystal     unknown  . Azithromycin Rash  . Levofloxacin Rash    REACTION: rash REACTION: rash  . Sulfamethoxazole-Trimethoprim Rash and Other (See Comments)    Chills, fever  . Sulfonamide Derivatives Rash and Other (See Comments)    Chills, fever    Medications Reviewed Today    Reviewed by [name], [facility] (Pharmacist) on 03/15/21 at 1042  Med List Status: <None>  Medication Order Taking? Sig Documenting Provider Last Dose Status Informant  0.9 %  sodium chloride infusion [id number]   Gatha Mayer, MD  Active   alendronate (FOSAMAX) 70 MG tablet [id number] Yes TAKE 1 TABLET BY MOUTH EVERY 7 DAYS WITH A FULL GLASS OF WATER ON AN EMPTY STOMACH. Susy Frizzle, MD Taking Active   atenolol (TENORMIN) 50 MG tablet [id number] Yes TAKE 1 TABLET BY MOUTH TWICE A DAY Susy Frizzle, MD Taking Active   cholecalciferol (VITAMIN D) 1000 units tablet [id number] Yes Take 2,000 Units by mouth daily. [provider] Taking Active   Coenzyme Q10 (CO Q 10 PO) [id number] No Take 100 mg by mouth daily.  Patient not taking: Reported on 03/14/2021   [provider] Not Taking Active   losartan (COZAAR) 25 MG tablet [id number] Yes Take 1 tablet (25 mg total) by mouth daily. Nahser, Wonda Cheng, MD Taking Active   omeprazole (PRILOSEC) 40 MG capsule [id number] Yes TAKE 1 CAPSULE BY MOUTH EVERY DAY Susy Frizzle, MD Taking Active   OVER THE COUNTER MEDICATION [id number] Yes Take 1 tablet by mouth daily. [facility] stool softener daily [provider] Taking Active   rosuvastatin (CRESTOR) 5 MG tablet [id number] No TAKE 1 TABLET BY MOUTH EVERY DAY  Patient not taking: Reported on 03/14/2021   Nahser, Wonda Cheng, MD Not Taking Active   spironolactone (ALDACTONE) 25 MG tablet [id number] Yes TAKE 1 TABLET BY MOUTH EVERY DAY Nahser,  Wonda Cheng, MD Taking Active           Patient Active Problem List   Diagnosis Date Noted  . PVC's (premature ventricular contractions) 02/10/2017  . Irritable bowel syndrome 08/27/2012  . Breast cancer of upper-inner quadrant of right female breast ([place]) 08/18/2011  . Malignant tumor of breast ([place]) 10/13/2010  . CYSTITIS, CHRONIC 03/29/2010  . PALPITATIONS 09/19/2009  . GERD 11/27/2008  . Osteoarthritis 08/28/2008  . ROTATOR CUFF TEAR 05/18/2008  . Dyslipidemia 07/01/2007  . Essential hypertension 04/07/2007  . Disorder of bone and cartilage 04/07/2007    Immunization History  Administered Date(s) Administered  . DTP 05/24/2007  . Fluad Quad(high Dose 65+) 06/21/2019, 07/18/2020  . Influenza Split 06/29/2012  . Influenza Whole 10/14/1999, 07/29/2007, 07/28/2008, 07/05/2009, 07/26/2010  . Influenza, High Dose Seasonal PF 07/25/2014, 08/02/2015, 06/25/2016, 06/16/2017  . Influenza,inj,Quad PF,6+ Mos 07/05/2013, 06/21/2018  . PFIZER(Purple Top)SARS-COV-2 Vaccination 10/25/2019, 11/14/2019, 08/02/2020  . Pneumococcal Conjugate-13 08/15/2013  . Pneumococcal Polysaccharide-23 10/13/2002, 04/19/2015  . Td 10/13/2006, 05/24/2007  . Zoster, Live 08/05/2016    Conditions to be addressed/monitored:  HTN, PVCs, GERD/IBS, Dyslipidemia  Care Plan : General Pharmacy (Adult)  Updates made by [name], RPH since 03/15/2021 12:00 AM    Problem: HTN, PVCs, GERD/IBS, Dyslipidemia   Priority: High  Onset Date: 03/14/2021    Long-Range Goal: Patient-Specific Goal   Start Date: 03/15/2021  Expected End Date: 09/14/2021  This Visit's Progress: On track  Priority: High  Note:   Current Barriers:  . None identified at this time  Pharmacist Clinical Goal(s):  Marland Kitchen Patient will maintain control of cholesterol as evidenced by lipid panel  . adhere to prescribed medication regimen as evidenced by pill box usage . contact provider office for questions/concerns as evidenced notation of  same in electronic health record through collaboration with PharmD and provider.   Interventions: . 1:1 collaboration with Susy Frizzle, MD regarding development and update of comprehensive plan of care as evidenced by provider attestation and co-signature .  Inter-disciplinary care team collaboration (see longitudinal plan of care) . 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 Losartan 10m daily . Atenolol 573mdaily . Spironolactone 2533maily -Medications previously tried: amlodipine, irbesartan/HCTZ, telmisartan/HCTZ  -Current home readings: :"normal" -Current exercise habits: walks daily for about 10-15 minutes -Denies hypotensive/hypertensive symptoms -Educated on BP goals and benefits of medications for prevention of heart attack, stroke and kidney damage; Exercise goal of 150 minutes per week; Importance of home blood pressure monitoring; Symptoms of hypotension and importance of maintaining adequate hydration; -Counseled to monitor BP at home periodically, document, and provide log at future appointments -Recommended to continue current medication  Hyperlipidemia: (LDL goal < 100) -Controlled -Current treatment: . Rosuvastatin 5mg61mily - holding in presence of acute liver disease -Medications previously tried: none noted  -Current exercise habits: see above -Educated on Cholesterol goals;  Benefits of statin for ASCVD risk reduction; Importance of limiting foods high in cholesterol; Strategies to manage statin-induced myalgias;- she is already taking CoQ10 -She is getting repeat liver test -Recommended to continue current medication Recommended resume Crestor if liver tests return to normal  GERD/IBS (Goal: Control symptoms) -Controlled -Current treatment  . Omeprazole 40mg60mly -Medications previously tried: none noted  -Denies symptoms  and takes medication appropriately -Working on trigger foods -Recommended to continue current medication   Patient Goals/Self-Care Activities . Patient will:  - take medications as prescribed check blood pressure periodically, document, and provide at future appointments target a minimum of 150 minutes of moderate intensity exercise weekly  Follow Up Plan: The care management team will reach out to the patient again over the next 180 days.        Medication Assistance: None required.  Patient affirms current coverage meets needs.  Compliance/Adherence/Medication fill history: Care Gaps: Zoster Vaccine    Patient's preferred pharmacy is:  [id number], [place] [address] [id number]: [phone] pill box? Yes Pt endorses 100% compliance  We discussed: Benefits of medication synchronization, packaging and delivery as well as enhanced pharmacist oversight with [employer]. Patient decided to: Continue current medication management strategy  Care Plan and Follow Up Patient Decision:  Patient agrees to Care Plan and Follow-up.  Plan: The care management team will reach out to the patient again over the next 180 days.  ChristBeverly MilchmD Clinical Pharmacist [place] [phone]

## 2021-03-15 LAB — COMPLETE METABOLIC PANEL WITHOUT GFR
AG Ratio: 1.4 (calc) (ref 1.0–2.5)
ALT: 44 U/L — ABNORMAL HIGH (ref 6–29)
AST: 18 U/L (ref 10–35)
Albumin: 4.1 g/dL (ref 3.6–5.1)
Alkaline phosphatase (APISO): 105 U/L (ref 37–153)
BUN/Creatinine Ratio: 20 (calc) (ref 6–22)
BUN: 19 mg/dL (ref 7–25)
CO2: 30 mmol/L (ref 20–32)
Calcium: 9.4 mg/dL (ref 8.6–10.4)
Chloride: 103 mmol/L (ref 98–110)
Creat: 0.95 mg/dL — ABNORMAL HIGH (ref 0.60–0.93)
GFR, Est African American: 66 mL/min/{1.73_m2}
GFR, Est Non African American: 57 mL/min/{1.73_m2} — ABNORMAL LOW
Globulin: 2.9 g/dL (ref 1.9–3.7)
Glucose, Bld: 91 mg/dL (ref 65–99)
Potassium: 4.3 mmol/L (ref 3.5–5.3)
Sodium: 141 mmol/L (ref 135–146)
Total Bilirubin: 0.7 mg/dL (ref 0.2–1.2)
Total Protein: 7 g/dL (ref 6.1–8.1)

## 2021-03-15 NOTE — Patient Instructions (Addendum)
Visit Information  Goals Addressed            This Visit's Progress   . Track and Manage My Blood Pressure-Hypertension       Timeframe:  Long-Range Goal Priority:  High Start Date:  03/15/21                           Expected End Date: 09/14/21                      Follow Up Date 06/15/21   - check blood pressure weekly - choose a place to take my blood pressure (home, clinic or office, retail store) - write blood pressure results in a log or diary    Why is this important?    You won't feel high blood pressure, but it can still hurt your blood vessels.   High blood pressure can cause heart or kidney problems. It can also cause a stroke.   Making lifestyle changes like losing a little weight or eating less salt will help.   Checking your blood pressure at home and at different times of the day can help to control blood pressure.   If the doctor prescribes medicine remember to take it the way the doctor ordered.   Call the office if you cannot afford the medicine or if there are questions about it.     Notes:       Patient Care Plan: General Pharmacy (Adult)    Problem Identified: HTN, PVCs, GERD/IBS, Dyslipidemia   Priority: High  Onset Date: 03/14/2021    Long-Range Goal: Patient-Specific Goal   Start Date: 03/15/2021  Expected End Date: 09/14/2021  This Visit's Progress: On track  Priority: High  Note:   Current Barriers:  . None identified at this time  Pharmacist Clinical Goal(s):  Marland Kitchen Patient will maintain control of cholesterol as evidenced by lipid panel  . adhere to prescribed medication regimen as evidenced by pill box usage . contact provider office for questions/concerns as evidenced notation of same in electronic health record through collaboration with PharmD and provider.   Interventions: . 1:1 collaboration with Susy Frizzle, MD regarding development and update of comprehensive plan of care as evidenced by provider attestation and  co-signature . Inter-disciplinary care team collaboration (see longitudinal plan of care) . 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 Losartan 25mg  daily . Atenolol 50mg  daily . Spironolactone 25mg  daily -Medications previously tried: amlodipine, irbesartan/HCTZ, telmisartan/HCTZ  -Current home readings: :"normal" -Current exercise habits: walks daily for about 10-15 minutes -Denies hypotensive/hypertensive symptoms -Educated on BP goals and benefits of medications for prevention of heart attack, stroke and kidney damage; Exercise goal of 150 minutes per week; Importance of home blood pressure monitoring; Symptoms of hypotension and importance of maintaining adequate hydration; -Counseled to monitor BP at home periodically, document, and provide log at future appointments -Recommended to continue current medication  Hyperlipidemia: (LDL goal < 100) -Controlled -Current treatment: . Rosuvastatin 5mg  daily - holding in presence of acute liver disease -Medications previously tried: none noted  -Current exercise habits: see above -Educated on Cholesterol goals;  Benefits of statin for ASCVD risk reduction; Importance of limiting foods high in cholesterol; Strategies to manage statin-induced myalgias;- she is already taking CoQ10 -She is getting repeat liver test -Recommended to continue current medication Recommended resume Crestor if liver tests return to normal  GERD/IBS (Goal: Control symptoms) -Controlled -Current treatment  . Omeprazole 40mg  daily -Medications previously tried: none noted  -Denies symptoms and takes medication appropriately -Working on trigger foods -Recommended to continue current medication   Patient Goals/Self-Care Activities . Patient will:  - take medications as prescribed check blood pressure periodically, document, and  provide at future appointments target a minimum of 150 minutes of moderate intensity exercise weekly  Follow Up Plan: The care management team will reach out to the patient again over the next 180 days.       Alexandra Calderon was given information about Chronic Care Management services today including:  1. CCM service includes personalized support from designated clinical staff supervised by her physician, including individualized plan of care and coordination with other care providers 2. 24/7 contact phone numbers for assistance for urgent and routine care needs. 3. Standard insurance, coinsurance, copays and deductibles apply for chronic care management only during months in which we provide at least 20 minutes of these services. Most insurances cover these services at 100%, however patients may be responsible for any copay, coinsurance and/or deductible if applicable. This service may help you avoid the need for more expensive face-to-face services. 4. Only one practitioner may furnish and bill the service in a calendar month. 5. The patient may stop CCM services at any time (effective at the end of the month) by phone call to the office staff.  Patient agreed to services and verbal consent obtained.   The patient verbalized understanding of instructions, educational materials, and care plan provided today and agreed to receive a mailed copy of patient instructions, educational materials, and care plan.  Telephone follow up appointment with pharmacy team member scheduled for: 6 months  [name], [place]  Hypertension, Adult High blood pressure (hypertension) is when the force of blood pumping through the arteries is too strong. The arteries are the blood vessels that carry blood from the heart throughout the body. Hypertension forces the heart to work harder to pump blood and may cause arteries to become narrow or stiff. Untreated or uncontrolled hypertension can cause a heart  attack, heart failure, a stroke, kidney disease, and other problems. A blood pressure reading consists of a higher number over a lower number. Ideally, your blood pressure should be below 120/80. The first ("top") number is called the systolic pressure. It is a measure of the pressure in your arteries as your heart beats. The second ("bottom") number is called the diastolic pressure. It is a measure of the pressure in your arteries as the heart relaxes. What are the causes? The exact cause of this condition is not known. There are some conditions that result in or are related to high blood pressure. What increases the risk? Some risk factors for high blood pressure are under your control. The following factors may make you more likely to develop this condition:  Smoking.  Having type 2 diabetes mellitus, high cholesterol, or both.  Not getting enough exercise or physical activity.  Being overweight.  Having too much fat, sugar, calories, or salt (sodium) in your diet.  Drinking too much alcohol. Some risk factors for high blood pressure may be difficult or impossible to change. Some of these factors include:  Having chronic kidney disease.  Having a family history of high blood pressure.  Age. Risk increases with age.  Race. You may be at higher risk if you are African American.  Gender. Men are at higher risk than women before age  9. After age 23, women are at higher risk than men.  Having obstructive sleep apnea.  Stress. What are the signs or symptoms? High blood pressure may not cause symptoms. Very high blood pressure (hypertensive crisis) may cause:  Headache.  Anxiety.  Shortness of breath.  Nosebleed.  Nausea and vomiting.  Vision changes.  Severe chest pain.  Seizures. How is this diagnosed? This condition is diagnosed by measuring your blood pressure while you are seated, with your arm resting on a flat surface, your legs uncrossed, and your feet flat on  the floor. The cuff of the blood pressure monitor will be placed directly against the skin of your upper arm at the level of your heart. It should be measured at least twice using the same arm. Certain conditions can cause a difference in blood pressure between your right and left arms. Certain factors can cause blood pressure readings to be lower or higher than normal for a short period of time:  When your blood pressure is higher when you are in a health care provider's office than when you are at home, this is called white coat hypertension. Most people with this condition do not need medicines.  When your blood pressure is higher at home than when you are in a health care provider's office, this is called masked hypertension. Most people with this condition may need medicines to control blood pressure. If you have a high blood pressure reading during one visit or you have normal blood pressure with other risk factors, you may be asked to:  Return on a different day to have your blood pressure checked again.  Monitor your blood pressure at home for 1 week or longer. If you are diagnosed with hypertension, you may have other blood or imaging tests to help your health care provider understand your overall risk for other conditions. How is this treated? This condition is treated by making healthy lifestyle changes, such as eating healthy foods, exercising more, and reducing your alcohol intake. Your health care provider may prescribe medicine if lifestyle changes are not enough to get your blood pressure under control, and if:  Your systolic blood pressure is above 130.  Your diastolic blood pressure is above 80. Your personal target blood pressure may vary depending on your medical conditions, your age, and other factors. Follow these instructions at home: Eating and drinking  Eat a diet that is high in fiber and potassium, and low in sodium, added sugar, and fat. An example eating plan is  called the DASH (Dietary Approaches to Stop Hypertension) diet. To eat this way: ? Eat plenty of fresh fruits and vegetables. Try to fill one half of your plate at each meal with fruits and vegetables. ? Eat whole grains, such as whole-wheat pasta, brown rice, or whole-grain bread. Fill about one fourth of your plate with whole grains. ? Eat or drink low-fat dairy products, such as skim milk or low-fat yogurt. ? Avoid fatty cuts of meat, processed or cured meats, and poultry with skin. Fill about one fourth of your plate with lean proteins, such as fish, chicken without skin, beans, eggs, or tofu. ? Avoid pre-made and processed foods. These tend to be higher in sodium, added sugar, and fat.  Reduce your daily sodium intake. Most people with hypertension should eat less than 1,500 mg of sodium a day.  Do not drink alcohol if: ? Your health care provider tells you not to drink. ? You are pregnant, may be pregnant, or  are planning to become pregnant.  If you drink alcohol: ? Limit how much you use to:  0-1 drink a day for women.  0-2 drinks a day for men. ? Be aware of how much alcohol is in your drink. In the [place] one drink equals one 12 oz bottle of beer (355 mL), one 5 oz glass of wine (148 mL), or one 1 oz glass of hard liquor (44 mL).   Lifestyle  Work with your health care provider to maintain a healthy body weight or to lose weight. Ask what an ideal weight is for you.  Get at least 30 minutes of exercise most days of the week. Activities may include walking, swimming, or biking.  Include exercise to strengthen your muscles (resistance exercise), such as Pilates or lifting weights, as part of your weekly exercise routine. Try to do these types of exercises for 30 minutes at least 3 days a week.  Do not use any products that contain nicotine or tobacco, such as cigarettes, e-cigarettes, and chewing tobacco. If you need help quitting, ask your health care provider.  Monitor your  blood pressure at home as told by your health care provider.  Keep all follow-up visits as told by your health care provider. This is important.   Medicines  Take over-the-counter and prescription medicines only as told by your health care provider. Follow directions carefully. Blood pressure medicines must be taken as prescribed.  Do not skip doses of blood pressure medicine. Doing this puts you at risk for problems and can make the medicine less effective.  Ask your health care provider about side effects or reactions to medicines that you should watch for. Contact a health care provider if you:  Think you are having a reaction to a medicine you are taking.  Have headaches that keep coming back (recurring).  Feel dizzy.  Have swelling in your ankles.  Have trouble with your vision. Get help right away if you:  Develop a severe headache or confusion.  Have unusual weakness or numbness.  Feel faint.  Have severe pain in your chest or abdomen.  Vomit repeatedly.  Have trouble breathing. Summary  Hypertension is when the force of blood pumping through your arteries is too strong. If this condition is not controlled, it may put you at risk for serious complications.  Your personal target blood pressure may vary depending on your medical conditions, your age, and other factors. For most people, a normal blood pressure is less than 120/80.  Hypertension is treated with lifestyle changes, medicines, or a combination of both. Lifestyle changes include losing weight, eating a healthy, low-sodium diet, exercising more, and limiting alcohol. This information is not intended to replace advice given to you by your health care provider. Make sure you discuss any questions you have with your health care provider. Document Revised: 06/09/2018 Document Reviewed: 06/09/2018 [employer]vier Patient Education  2021 [employer].

## 2021-03-18 ENCOUNTER — Ambulatory Visit: Payer: Self-pay | Admitting: Surgery

## 2021-03-18 DIAGNOSIS — R160 Hepatomegaly, not elsewhere classified: Secondary | ICD-10-CM | POA: Diagnosis not present

## 2021-03-18 DIAGNOSIS — K581 Irritable bowel syndrome with constipation: Secondary | ICD-10-CM | POA: Diagnosis not present

## 2021-03-18 DIAGNOSIS — Z86010 Personal history of colonic polyps: Secondary | ICD-10-CM | POA: Diagnosis not present

## 2021-03-18 DIAGNOSIS — K219 Gastro-esophageal reflux disease without esophagitis: Secondary | ICD-10-CM | POA: Diagnosis not present

## 2021-03-18 DIAGNOSIS — R002 Palpitations: Secondary | ICD-10-CM | POA: Diagnosis not present

## 2021-03-18 DIAGNOSIS — K8010 Calculus of gallbladder with chronic cholecystitis without obstruction: Secondary | ICD-10-CM | POA: Diagnosis not present

## 2021-03-18 DIAGNOSIS — K760 Fatty (change of) liver, not elsewhere classified: Secondary | ICD-10-CM | POA: Diagnosis not present

## 2021-03-18 NOTE — H&P (Signed)
Alexandra Calderon Appointment: 03/18/2021 9:30 AM Location: [facility] Patient #: [id number] DOB: April 20, 1941 Married / Language: English / Race: White Female  History of Present Illness Alexandra Hector MD; 03/18/2021 1:31 PM) The patient is a 80 year old female who presents for evaluation of gall stones. Note for "Gall stones": ` ` ` Patient sent for surgical consultation at the request of Alexandra Luo, MD  Chief Complaint: Abdominal pain and increased liver function tests. Possible gallstones. ` ` The patient is a pleasant woman that had nonalcoholic fatty liver disease followed by Dr. Carlean Calderon. 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 4 by [employer]. Patient's had some intermittent episodes of upper abdominal pain radiating to the right upper quadrant and back. He seemed a.m. after having oatmeal &blueberries. She's had some occasional episodes of burning and cooling sensation in her upper abdomen as well but not severe. She's had a few recurrent episodes. She has a history of reflux on omeprazole. Occasional burping. That's been relatively stable with the medication. The episodes last about an hour. She moves her bowels about twice a week. Not on any specific bowel regimen. She did have some self-professed palpitations. Seen by Dr. nausea or with cardiology. Underwhelming Holter monitor with a few PVCs/PACs. Stable on atenolol. She can walk a couple miles without much difficulty. She had an open right upper quadrant abdominal surgery for kidney stones in the 1960s. No other abdominal surgeries.  Because of worsening symptoms surgical consultation offered. She had sludge noted on ultrasound in the past. Repeat showed gallstones. She's had some documented mildly increased liver function tests. Fatty liver noted. Does not drink alcohol. Her weight relatively stable. Tends to avoid fast foods or rich meals anyway.  (Review of systems as stated  in this history (HPI) or in the review of systems. Otherwise all other 12 point ROS are negative) ` ` ###########################################`  This patient encounter took 35 minutes today to perform the following: obtain history, perform exam, review outside records, interpret tests & imaging, counsel the patient on their diagnosis; and, document this encounter, including findings & plan in the electronic health record (EHR).   Diagnostic Studies History Alexandra Forehand, Calderon; 03/18/2021 9:53 AM) Colonoscopy within last year Mammogram within last year Pap Smear >5 years ago  Allergies Alexandra Forehand, Calderon; 03/18/2021 9:55 AM) Sulfa 10 *OPHTHALMIC AGENTS* Zithromax *MACROLIDES* Allergies Reconciled  Family History Alexandra Forehand, Calderon; 03/18/2021 9:53 AM) Arthritis Mother. Cancer Father. Colon Cancer Mother.  Pregnancy / Birth History Alexandra Forehand, Calderon; 03/18/2021 9:53 AM) Age of menopause 86-55 Gravida 2 Maternal age 20-20 Para 2  Other Problems Alexandra Forehand, Calderon; 03/18/2021 9:53 AM) Arthritis Breast Cancer Cholelithiasis Gastroesophageal Reflux Disease High blood pressure Hypercholesterolemia     Review of Systems Alexandra Reams Alston Calderon; 03/18/2021 9:53 AM) General Not Present- Appetite Loss, Chills, Fatigue, Fever, Night Sweats, Weight Gain and Weight Loss. Skin Not Present- Change in Wart/Mole, Dryness, Hives, Jaundice, New Lesions, Non-Healing Wounds, Rash and Ulcer. HEENT Not Present- Earache, Hearing Loss, Hoarseness, Nose Bleed, Oral Ulcers, Ringing in the Ears, Seasonal Allergies, Sinus Pain, Sore Throat, Visual Disturbances, Wears glasses/contact lenses and Yellow Eyes. Respiratory Not Present- Bloody sputum, Chronic Cough, Difficulty Breathing, Snoring and Wheezing. Breast Not Present- Breast Mass, Breast Pain, Nipple Discharge and Skin Changes. Cardiovascular Present- Leg Cramps and Palpitations. Not Present- Chest Pain, Difficulty  Breathing Lying Down, Rapid Heart Rate, Shortness of Breath and Swelling of Extremities. Gastrointestinal Present- Excessive gas. Not Present- Abdominal Pain, Bloating, Bloody Stool, Change in  Bowel Habits, Chronic diarrhea, Constipation, Difficulty Swallowing, Gets full quickly at meals, Hemorrhoids, Indigestion, Nausea, Rectal Pain and Vomiting. Female Genitourinary Not Present- Frequency, Nocturia, Painful Urination, Pelvic Pain and Urgency. Musculoskeletal Not Present- Back Pain, Joint Pain, Joint Stiffness, Muscle Pain, Muscle Weakness and Swelling of Extremities. Neurological Not Present- Decreased Memory, Fainting, Headaches, Numbness, Seizures, Tingling, Tremor, Trouble walking and Weakness. Psychiatric Not Present- Anxiety, Bipolar, Change in Sleep Pattern, Depression, Fearful and Frequent crying. Endocrine Not Present- Cold Intolerance, Excessive Hunger, Hair Changes, Heat Intolerance, Hot flashes and New Diabetes. Hematology Not Present- Blood Thinners, Easy Bruising, Excessive bleeding, Gland problems, HIV and Persistent Infections.  Vitals (Alexandra Calderon; 03/18/2021 9:56 AM) 03/18/2021 9:55 AM Weight: 188.13 lb Height: 66in Body Surface Area: 1.95 m Body Mass Index: 30.36 kg/m  Temp.: 70F  Pulse: 65 (Regular)  P.OX: 95% (Room air) BP: 140/70(Sitting, Left Arm, Standard)        Physical Exam Alexandra Hector MD; 03/18/2021 1:32 PM)  General Mental Status-Alert. General Appearance-Not in acute distress, Not Sickly. Orientation-Oriented X3. Hydration-Well hydrated. Voice-Normal.  Integumentary Global Assessment Upon inspection and palpation of skin surfaces of the - Axillae: non-tender, no inflammation or ulceration, no drainage. and Distribution of scalp and body hair is normal. General Characteristics Temperature - normal warmth is noted.  Head and Neck Head-normocephalic, atraumatic with no lesions or palpable masses. Face Global  Assessment - atraumatic, no absence of expression. Neck Global Assessment - no abnormal movements, no bruit auscultated on the right, no bruit auscultated on the left, no decreased range of motion, non-tender. Trachea-midline. Thyroid Gland Characteristics - non-tender.  Eye Eyeball - Left-Extraocular movements intact, No Nystagmus - Left. Eyeball - Right-Extraocular movements intact, No Nystagmus - Right. Cornea - Left-No Hazy - Left. Cornea - Right-No Hazy - Right. Sclera/Conjunctiva - Left-No scleral icterus, No Discharge - Left. Sclera/Conjunctiva - Right-No scleral icterus, No Discharge - Right. Pupil - Left-Direct reaction to light normal. Pupil - Right-Direct reaction to light normal.  ENMT Ears Pinna - Left - no drainage observed, no generalized tenderness observed. Pinna - Right - no drainage observed, no generalized tenderness observed. Nose and Sinuses External Inspection of the Nose - no destructive lesion observed. Inspection of the nares - Left - quiet respiration. Inspection of the nares - Right - quiet respiration. Mouth and Throat Lips - Upper Lip - no fissures observed, no pallor noted. Lower Lip - no fissures observed, no pallor noted. Nasopharynx - no discharge present. Oral Cavity/Oropharynx - Tongue - no dryness observed. Oral Mucosa - no cyanosis observed. Hypopharynx - no evidence of airway distress observed.  Chest and Lung Exam Inspection Movements - Normal and Symmetrical. Accessory muscles - No use of accessory muscles in breathing. Palpation Palpation of the chest reveals - Non-tender. Auscultation Breath sounds - Normal and Clear.  Cardiovascular Auscultation Rhythm - Regular. Murmurs & Other Heart Sounds - Auscultation of the heart reveals - No Murmurs and No Systolic Clicks.  Abdomen Inspection Inspection of the abdomen reveals - No Visible peristalsis and No Abnormal pulsations. Umbilicus - No Bleeding, No Urine  drainage. Palpation/Percussion Palpation and Percussion of the abdomen reveal - Soft, Non Tender, No Rebound tenderness, No Rigidity (guarding) and No Cutaneous hyperesthesia. Note: Abdomen soft. Right upper quadrant transverse incision on flank consistent with prior kidney surgery. Mild epigastric discomfort but no Murphy sign. Not severely distended. No diastasis recti. No umbilical or other anterior abdominal wall hernias  Female Genitourinary Sexual Maturity Tanner 5 - Adult hair pattern. Note: No  vaginal bleeding nor discharge  Peripheral Vascular Upper Extremity Inspection - Left - No Cyanotic nailbeds - Left, Not Ischemic. Inspection - Right - No Cyanotic nailbeds - Right, Not Ischemic.  Neurologic Neurologic evaluation reveals -normal attention span and ability to concentrate, able to name objects and repeat phrases. Appropriate fund of knowledge , normal sensation and normal coordination. Mental Status Affect - not angry, not paranoid. Cranial Nerves-Normal Bilaterally. Gait-Normal.  Neuropsychiatric Mental status exam performed with findings of-able to articulate well with normal speech/language, rate, volume and coherence, thought content normal with ability to perform basic computations and apply abstract reasoning and no evidence of hallucinations, delusions, obsessions or homicidal/suicidal ideation.  Musculoskeletal Global Assessment Spine, Ribs and Pelvis - no instability, subluxation or laxity. Right Upper Extremity - no instability, subluxation or laxity.  Lymphatic Head & Neck  General Head & Neck Lymphatics: Bilateral - Description - No Localized lymphadenopathy. Axillary  General Axillary Region: Bilateral - Description - No Localized lymphadenopathy. Femoral & Inguinal  Generalized Femoral & Inguinal Lymphatics: Left - Description - No Localized lymphadenopathy. Right - Description - No Localized lymphadenopathy.    Assessment & Plan Alexandra Hector MD; 03/18/2021 10:43 AM)  CHRONIC CHOLECYSTITIS WITH CALCULUS (K80.10) Impression: Rather classic story biliary colic triggered by food. Respiratory diagnosis underwhelming.  Offer cholecystectomy. Had a long discussion about differential diagnosis, surgical intervention, etc.  Patient agrees to proceed. Husband agrees as well. There trying to find a time that will not be too inconvenient with her Alexandra Calderon travel plans. We will see if we can do this away safely.  She has rather good exercise tolerance. Had an underwhelming cardiac workup 2 years ago by Dr. nausea or. I do not think we need more aggressive evaluation.  Current Plans You are being scheduled for surgery- Our schedulers will call you.  You should hear from our office's scheduling department within 5 working days about the location, date, and time of surgery. We try to make accommodations for patient's preferences in scheduling surgery, but sometimes the OR schedule or the surgeon's schedule prevents [place] from making those accommodations.  If you have not heard from our office [phone]) in 5 working days, call the office and ask for your surgeon's nurse.  If you have other questions about your diagnosis, plan, or surgery, call the office and ask for your surgeon's nurse.  Written instructions provided Pt Education - Pamphlet Given - Laparoscopic Gallbladder Surgery: discussed with patient and provided information. The anatomy & physiology of hepatobiliary & pancreatic function was discussed. The pathophysiology of gallbladder dysfunction was discussed. Natural history risks without surgery was discussed. I feel the risks of no intervention will lead to serious problems that outweigh the operative risks; therefore, I recommended cholecystectomy to remove the pathology. I explained laparoscopic techniques with possible need for an open approach. Probable cholangiogram to evaluate the bilary tract was explained as  well.  Risks such as bleeding, infection, abscess, leak, injury to other organs, need for further treatment, heart attack, death, and other risks were discussed. I noted a good likelihood this will help address the problem. Possibility that this will not correct all abdominal symptoms was explained. Goals of post-operative recovery were discussed as well. We will work to minimize complications. An educational handout further explaining the pathology and treatment options was given as well. Questions were answered. The patient expresses understanding & wishes to proceed with surgery.  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 Perhaps she will get some increase after cholecystectomy.  Multiply defer to GI.  Current Plans Pt Education - [facility] Good Bowel Health (Alexandra Calderon) Pt Education - [facility] IBS patient info: discussed with patient and provided information.  LIVER MASS, LEFT LOBE (R16.0) Impression: Questionable small mass on left hepatic liver lobe. Most likely complexes. Hemorrhoid pending. Defer to primary care.   INTERMITTENT PALPITATIONS (R00.2) Impression: History of feeling of palpitations with negative Holter monitor 2019 and good exercise tolerance. Evaluation by Dr. nausea. Given this a low risk surgery, I do not feel strongly she needs cardiac clearance.   NAFLD (NONALCOHOLIC FATTY LIVER DISEASE) (K76.0) Impression: Mildly increased liver function tests with fatty liver disease suspected on imaging. May consider needle biopsy while there are. See what MRI shows. We will see. Different GI. She is already abstinent of alcohol and is trying to keep her rate down. Defer medication list concerns but nothing that striking on my evaluation.   HISTORY OF  ADENOMATOUS POLYP OF COLON (Z86.010) Impression: History of adenomatous polyps on colonoscopy 2016. Recent one last year by Dr. Nicky Calderon.  Current Plans Pt Education - Polyps in the Colon and Rectum (Colonic and Rectal Polyps): colonic polyps  GERD (GASTROESOPHAGEAL REFLUX DISEASE) (K21.9) Impression: History of heartburn and gastric esophageal reflux disease stable on omeprazole. Symptoms are NOT consistent with that. Defer to GI.  Alexandra Hector, MD, FACS, MASCRS Esophageal, Gastrointestinal & Colorectal Surgery Robotic and Minimally Invasive Surgery  [facility] [address] [place], [place] [address] [phone] Fax [phone] Main/Paging  CONTACT INFORMATION: Weekday (9AM-5PM) concerns: Call [facility] main office at [phone] Weeknight (5PM-9AM) or Weekend/Holiday concerns: Check [email or web address] for General Surgery [facility] coverage (Please, do not use SecureChat as it is not reliable communication to operating surgeons for immediate patient care)

## 2021-04-01 ENCOUNTER — Ambulatory Visit
Admission: RE | Admit: 2021-04-01 | Discharge: 2021-04-01 | Disposition: A | Discharge status: Home / Self Care | Payer: Medicare HMO | Source: Ambulatory Visit | Attending: Family Medicine | Admitting: Family Medicine

## 2021-04-01 DIAGNOSIS — K760 Fatty (change of) liver, not elsewhere classified: Secondary | ICD-10-CM | POA: Diagnosis not present

## 2021-04-01 DIAGNOSIS — K769 Liver disease, unspecified: Secondary | ICD-10-CM

## 2021-04-01 DIAGNOSIS — I700 Atherosclerosis of aorta: Secondary | ICD-10-CM | POA: Diagnosis not present

## 2021-04-01 MED ORDER — GADOBENATE DIMEGLUMINE 529 MG/ML IV SOLN
18.0000 mL | Freq: Once | INTRAVENOUS | Status: AC | PRN
  Administered 2021-04-01: 18 mL via INTRAVENOUS

## 2021-04-10 ENCOUNTER — Telehealth: Payer: Self-pay | Admitting: Pharmacist

## 2021-04-10 NOTE — Progress Notes (Addendum)
    Chronic Care Management Pharmacy Assistant   Name: Alexandra Calderon  MRN: [id number] DOB: [birth year]  Reason for Encounter: Adherence Review  Reviewed the patients chart for any medical/health and/or medication changes there were not any changes at this time.   Follow-Up:Pharmacist Review  Charlann Lange, [place] Pharmacist Assistant [phone]

## 2021-04-17 NOTE — Patient Instructions (Signed)
DUE TO COVID-19 ONLY ONE VISITOR IS ALLOWED TO COME WITH YOU AND STAY IN THE WAITING ROOM ONLY DURING PRE OP AND PROCEDURE DAY OF SURGERY. THE 1 VISITOR  MAY VISIT WITH YOU AFTER SURGERY IN YOUR PRIVATE ROOM DURING VISITING HOURS ONLY!               Alexandra Calderon   Your procedure is scheduled on: 04/25/21   Report to [facility] Main  Entrance   Report to admitting at : 1:30 PM     Call this number if you have problems the morning of surgery [phone]    Remember: NO SOLID FOOD AFTER MIDNIGHT THE NIGHT PRIOR TO SURGERY. NOTHING BY MOUTH EXCEPT CLEAR LIQUIDS UNTIL: 12:30 PM . PLEASE FINISH ENSURE DRINK PER SURGEON ORDER  WHICH NEEDS TO BE COMPLETED AT : 12:30 PM.   CLEAR LIQUID DIET  Foods Allowed                                                                     Foods Excluded  Coffee and tea, regular and decaf                             liquids that you cannot  Plain Jell-O any favor except red or purple                                           see through such as: Fruit ices (not with fruit pulp)                                     milk, soups, orange juice  Iced Popsicles                                    All solid food Carbonated beverages, regular and diet                                    Cranberry, grape and apple juices Sports drinks like Gatorade Lightly seasoned clear broth or consume(fat free) Sugar, honey syrup  Sample Menu Breakfast                                Lunch                                     Supper Cranberry juice                    Beef broth                            Chicken broth Jell-O  Grape juice                           Apple juice Coffee or tea                        Jell-O                                      Popsicle                                                Coffee or tea                        Coffee or tea  _____________________________________________________________________   BRUSH  YOUR TEETH MORNING OF SURGERY AND RINSE YOUR MOUTH OUT, NO CHEWING GUM CANDY OR MINTS.    Take these medicines the morning of surgery with A SIP OF WATER: omeprazole,atenolol.                               You may not have any metal on your body including hair pins and              piercings  Do not wear jewelry, make-up, lotions, powders or perfumes, deodorant             Do not wear nail polish on your fingernails.  Do not shave  48 hours prior to surgery.    Do not bring valuables to the hospital. Alexandra Calderon.  Contacts, dentures or bridgework may not be worn into surgery.  Leave suitcase in the car. After surgery it may be brought to your room.     Patients discharged the day of surgery will not be allowed to drive home. IF YOU ARE HAVING SURGERY AND GOING HOME THE SAME DAY, YOU MUST HAVE AN ADULT TO DRIVE YOU HOME AND BE WITH YOU FOR 24 HOURS. YOU MAY GO HOME BY TAXI OR UBER OR ORTHERWISE, BUT AN ADULT MUST ACCOMPANY YOU HOME AND STAY WITH YOU FOR 24 HOURS.  Name and phone number of your driver:  Special Instructions: N/A              Please read over the following fact sheets you were given: _____________________________________________________________________           [facility] - Preparing for Surgery Before surgery, you can play an important role.  Because skin is not sterile, your skin needs to be as free of germs as possible.  You can reduce the number of germs on your skin by washing with CHG (chlorahexidine gluconate) soap before surgery.  CHG is an antiseptic cleaner which kills germs and bonds with the skin to continue killing germs even after washing. Please DO NOT use if you have an allergy to CHG or antibacterial soaps.  If your skin becomes reddened/irritated stop using the CHG and inform your nurse when you arrive at Short Stay. Do not shave (including legs and underarms) for at least 48 hours prior to the first  CHG shower.   You may shave your face/neck. Please follow these instructions carefully:  1.  Shower with CHG Soap the night before surgery and the  morning of Surgery.  2.  If you choose to wash your hair, wash your hair first as usual with your  normal  shampoo.  3.  After you shampoo, rinse your hair and body thoroughly to remove the  shampoo.                           4.  Use CHG as you would any other liquid soap.  You can apply chg directly  to the skin and wash                       Gently with a scrungie or clean washcloth.  5.  Apply the CHG Soap to your body ONLY FROM THE NECK DOWN.   Do not use on face/ open                           Wound or open sores. Avoid contact with eyes, ears mouth and genitals (private parts).                       Wash face,  Genitals (private parts) with your normal soap.             6.  Wash thoroughly, paying special attention to the area where your surgery  will be performed.  7.  Thoroughly rinse your body with warm water from the neck down.  8.  DO NOT shower/wash with your normal soap after using and rinsing off  the CHG Soap.                9.  Pat yourself dry with a clean towel.            10.  Wear clean pajamas.            11.  Place clean sheets on your bed the night of your first shower and do not  sleep with pets. Day of Surgery : Do not apply any lotions/deodorants the morning of surgery.  Please wear clean clothes to the hospital/surgery center.  FAILURE TO FOLLOW THESE INSTRUCTIONS MAY RESULT IN THE CANCELLATION OF YOUR SURGERY PATIENT SIGNATURE_________________________________  NURSE SIGNATURE__________________________________  ________________________________________________________________________

## 2021-04-18 ENCOUNTER — Encounter (HOSPITAL_COMMUNITY)
Admission: RE | Admit: 2021-04-18 | Discharge: 2021-04-18 | Disposition: A | Discharge status: Home / Self Care | Payer: Medicare HMO | Source: Ambulatory Visit | Attending: Surgery | Admitting: Surgery

## 2021-04-18 ENCOUNTER — Other Ambulatory Visit: Payer: Self-pay

## 2021-04-18 ENCOUNTER — Encounter (HOSPITAL_COMMUNITY): Payer: Self-pay

## 2021-04-18 DIAGNOSIS — Z01812 Encounter for preprocedural laboratory examination: Secondary | ICD-10-CM | POA: Insufficient documentation

## 2021-04-18 HISTORY — DX: Personal history of urinary calculi: Z87.442

## 2021-04-18 HISTORY — DX: Cardiac arrhythmia, unspecified: I49.9

## 2021-04-18 HISTORY — DX: Fatty (change of) liver, not elsewhere classified: K76.0

## 2021-04-18 LAB — CBC
HCT: 42.8 % (ref 36.0–46.0)
Hemoglobin: 14 g/dL (ref 12.0–15.0)
MCH: 29.9 pg (ref 26.0–34.0)
MCHC: 32.7 g/dL (ref 30.0–36.0)
MCV: 91.3 fL (ref 80.0–100.0)
Platelets: 212 10*3/uL (ref 150–400)
RBC: 4.69 MIL/uL (ref 3.87–5.11)
RDW: 13.7 % (ref 11.5–15.5)
WBC: 4.5 10*3/uL (ref 4.0–10.5)
nRBC: 0 % (ref 0.0–0.2)

## 2021-04-18 LAB — BASIC METABOLIC PANEL WITH GFR
Anion gap: 9 (ref 5–15)
BUN: 16 mg/dL (ref 8–23)
CO2: 27 mmol/L (ref 22–32)
Calcium: 9.3 mg/dL (ref 8.9–10.3)
Chloride: 104 mmol/L (ref 98–111)
Creatinine, Ser: 0.9 mg/dL (ref 0.44–1.00)
GFR, Estimated: >60 mL/min
Glucose, Bld: 80 mg/dL (ref 70–99)
Potassium: 4.2 mmol/L (ref 3.5–5.1)
Sodium: 140 mmol/L (ref 135–145)

## 2021-04-18 NOTE — Progress Notes (Signed)
COVID Vaccine Completed: Yes Date COVID Vaccine completed: 08/02/20 [place] COVID vaccine manufacturer: Pfizer       PCP - Dr. Jenna Luo. LOV: 03/05/21 Cardiologist - Dr. Mertie Moores. LOV: 06/26/20  Chest x-ray -  EKG - 06/26/20 EPIC Stress Test -  ECHO -  Cardiac Cath -  Pacemaker/ICD device last checked:  Sleep Study -  CPAP -   Fasting Blood Sugar -  Checks Blood Sugar _____ times a day  Blood Thinner Instructions: Aspirin Instructions: Last Dose:  Anesthesia review: Hx: HTN,PVC'palpitations  Patient denies shortness of breath, fever, cough and chest pain at PAT appointment   Patient verbalized understanding of instructions that were given to them at the PAT appointment. Patient was also instructed that they will need to review over the PAT instructions again at home before surgery.

## 2021-04-24 MED ORDER — BUPIVACAINE LIPOSOME 1.3 % IJ SUSP
20.0000 mL | Freq: Once | INTRAMUSCULAR | Status: DC
  Filled 2021-04-24: qty 20

## 2021-04-24 NOTE — Anesthesia Preprocedure Evaluation (Addendum)
Anesthesia Evaluation  Patient identified by MRN, date of birth, ID band Patient awake    Reviewed: Allergy & Precautions, NPO status , Patient's Chart, lab work & pertinent test results  Airway Mallampati: II  TM Distance: >3 FB Neck ROM: Full    Dental no notable dental hx. (+) Teeth Intact, Dental Advisory Given   Pulmonary neg pulmonary ROS,    Pulmonary exam normal breath sounds clear to auscultation       Cardiovascular hypertension, Pt. on medications Normal cardiovascular exam Rhythm:Regular Rate:Normal     Neuro/Psych negative neurological ROS  negative psych ROS   GI/Hepatic Neg liver ROS, GERD  ,  Endo/Other  negative endocrine ROS  Renal/GU Renal diseaseLab Results      Component                Value               Date                      WBC                      4.5                 04/18/2021                HGB                      14.0                04/18/2021                HCT                      42.8                04/18/2021                MCV                      91.3                04/18/2021                PLT                      212                 04/18/2021                Musculoskeletal  (+) Arthritis ,   Abdominal   Peds  Hematology Lab Results      Component                Value               Date                      WBC                      4.5                 04/18/2021                HGB  14.0                04/18/2021                HCT                      42.8                04/18/2021                MCV                      91.3                04/18/2021                PLT                      212                 04/18/2021              Anesthesia Other Findings Breast ca  Reproductive/Obstetrics                           Anesthesia Physical Anesthesia Plan  ASA: 3  Anesthesia Plan: General   Post-op Pain Management:     Induction: Intravenous  PONV Risk Score and Plan: Treatment may vary due to age or medical condition, Dexamethasone and Ondansetron  Airway Management Planned: Oral ETT  Additional Equipment: None  Intra-op Plan:   Post-operative Plan: Extubation in OR  Informed Consent: I have reviewed the patients History and Physical, chart, labs and discussed the procedure including the risks, benefits and alternatives for the proposed anesthesia with the patient or authorized representative who has indicated his/her understanding and acceptance.     Dental advisory given  Plan Discussed with: CRNA and Anesthesiologist  Anesthesia Plan Comments:        Anesthesia Quick Evaluation

## 2021-04-25 ENCOUNTER — Ambulatory Visit (HOSPITAL_COMMUNITY)
Admission: RE | Admit: 2021-04-25 | Discharge: 2021-04-25 | Disposition: A | Discharge status: Home / Self Care | Payer: Medicare HMO | Source: Ambulatory Visit | Attending: Surgery | Admitting: Surgery

## 2021-04-25 ENCOUNTER — Ambulatory Visit (HOSPITAL_COMMUNITY): Payer: Medicare HMO | Admitting: Anesthesiology

## 2021-04-25 ENCOUNTER — Encounter (HOSPITAL_COMMUNITY): Payer: Self-pay | Admitting: Surgery

## 2021-04-25 ENCOUNTER — Encounter (HOSPITAL_COMMUNITY)
Admission: RE | Disposition: A | Discharge status: Home / Self Care | Payer: Self-pay | Source: Ambulatory Visit | Attending: Surgery

## 2021-04-25 ENCOUNTER — Ambulatory Visit (HOSPITAL_COMMUNITY): Payer: Medicare HMO | Admitting: Emergency Medicine

## 2021-04-25 ENCOUNTER — Ambulatory Visit (HOSPITAL_COMMUNITY): Payer: Medicare HMO

## 2021-04-25 DIAGNOSIS — K7581 Nonalcoholic steatohepatitis (NASH): Secondary | ICD-10-CM | POA: Diagnosis not present

## 2021-04-25 DIAGNOSIS — K760 Fatty (change of) liver, not elsewhere classified: Secondary | ICD-10-CM | POA: Diagnosis not present

## 2021-04-25 DIAGNOSIS — K8064 Calculus of gallbladder and bile duct with chronic cholecystitis without obstruction: Secondary | ICD-10-CM | POA: Insufficient documentation

## 2021-04-25 DIAGNOSIS — Z87442 Personal history of urinary calculi: Secondary | ICD-10-CM | POA: Insufficient documentation

## 2021-04-25 DIAGNOSIS — Z881 Allergy status to other antibiotic agents status: Secondary | ICD-10-CM | POA: Diagnosis not present

## 2021-04-25 DIAGNOSIS — Z882 Allergy status to sulfonamides status: Secondary | ICD-10-CM | POA: Diagnosis not present

## 2021-04-25 DIAGNOSIS — E785 Hyperlipidemia, unspecified: Secondary | ICD-10-CM | POA: Diagnosis not present

## 2021-04-25 DIAGNOSIS — K8010 Calculus of gallbladder with chronic cholecystitis without obstruction: Secondary | ICD-10-CM | POA: Diagnosis not present

## 2021-04-25 DIAGNOSIS — I493 Ventricular premature depolarization: Secondary | ICD-10-CM | POA: Diagnosis not present

## 2021-04-25 DIAGNOSIS — K828 Other specified diseases of gallbladder: Secondary | ICD-10-CM | POA: Diagnosis not present

## 2021-04-25 HISTORY — PX: LAPAROSCOPIC CHOLECYSTECTOMY SINGLE SITE WITH INTRAOPERATIVE CHOLANGIOGRAM: SHX6538

## 2021-04-25 LAB — ABO/RH: ABO/RH(D): AB POS

## 2021-04-25 LAB — TYPE AND SCREEN
ABO/RH(D): AB POS
Antibody Screen: NEGATIVE

## 2021-04-25 SURGERY — LAPAROSCOPIC CHOLECYSTECTOMY SINGLE SITE WITH INTRAOPERATIVE CHOLANGIOGRAM
Anesthesia: General

## 2021-04-25 MED ORDER — TRAMADOL HCL 50 MG PO TABS: 50.0000 mg | ORAL_TABLET | Freq: Four times a day (QID) | ORAL | 0 refills | Status: DC | PRN

## 2021-04-25 MED ORDER — ONDANSETRON HCL 4 MG/2ML IJ SOLN
INTRAMUSCULAR | Status: AC
  Filled 2021-04-25: qty 2

## 2021-04-25 MED ORDER — IOHEXOL 300 MG/ML  SOLN
INTRAMUSCULAR | Status: DC | PRN
  Administered 2021-04-25: 9 mL

## 2021-04-25 MED ORDER — PROPOFOL 10 MG/ML IV BOLUS
INTRAVENOUS | Status: DC | PRN
  Administered 2021-04-25: 130 mg via INTRAVENOUS

## 2021-04-25 MED ORDER — LACTATED RINGERS IV SOLN: INTRAVENOUS | Status: DC

## 2021-04-25 MED ORDER — FENTANYL CITRATE (PF) 250 MCG/5ML IJ SOLN
INTRAMUSCULAR | Status: DC | PRN
  Administered 2021-04-25 (×5): 50 ug via INTRAVENOUS

## 2021-04-25 MED ORDER — METRONIDAZOLE 500 MG/100ML IV SOLN
500.0000 mg | INTRAVENOUS | Status: AC
  Administered 2021-04-25: 500 mg via INTRAVENOUS
  Filled 2021-04-25: qty 100

## 2021-04-25 MED ORDER — LIDOCAINE 2% (20 MG/ML) 5 ML SYRINGE
INTRAMUSCULAR | Status: AC
  Filled 2021-04-25: qty 5

## 2021-04-25 MED ORDER — GABAPENTIN 300 MG PO CAPS
300.0000 mg | ORAL_CAPSULE | ORAL | Status: AC
  Administered 2021-04-25: 300 mg via ORAL
  Filled 2021-04-25: qty 1

## 2021-04-25 MED ORDER — ACETAMINOPHEN 500 MG PO TABS
1000.0000 mg | ORAL_TABLET | ORAL | Status: AC
  Administered 2021-04-25: 1000 mg via ORAL
  Filled 2021-04-25: qty 2

## 2021-04-25 MED ORDER — FENTANYL CITRATE (PF) 100 MCG/2ML IJ SOLN
INTRAMUSCULAR | Status: AC
  Administered 2021-04-25: 50 ug via INTRAVENOUS
  Filled 2021-04-25: qty 2

## 2021-04-25 MED ORDER — DEXAMETHASONE SODIUM PHOSPHATE 10 MG/ML IJ SOLN
INTRAMUSCULAR | Status: AC
  Filled 2021-04-25: qty 1

## 2021-04-25 MED ORDER — ONDANSETRON HCL 4 MG/2ML IJ SOLN
4.0000 mg | Freq: Once | INTRAMUSCULAR | Status: AC | PRN
  Administered 2021-04-25: 4 mg via INTRAVENOUS

## 2021-04-25 MED ORDER — FENTANYL CITRATE (PF) 100 MCG/2ML IJ SOLN: 25.0000 ug | INTRAMUSCULAR | Status: DC | PRN

## 2021-04-25 MED ORDER — ACETAMINOPHEN 10 MG/ML IV SOLN
INTRAVENOUS | Status: AC
  Filled 2021-04-25: qty 100

## 2021-04-25 MED ORDER — BUPIVACAINE-EPINEPHRINE 0.25% -1:200000 IJ SOLN
INTRAMUSCULAR | Status: DC | PRN
  Administered 2021-04-25: 30 mL

## 2021-04-25 MED ORDER — BUPIVACAINE-EPINEPHRINE (PF) 0.25% -1:200000 IJ SOLN
INTRAMUSCULAR | Status: AC
  Filled 2021-04-25: qty 30

## 2021-04-25 MED ORDER — PROPOFOL 10 MG/ML IV BOLUS
INTRAVENOUS | Status: AC
  Filled 2021-04-25: qty 20

## 2021-04-25 MED ORDER — STERILE WATER FOR IRRIGATION IR SOLN
Status: DC | PRN
  Administered 2021-04-25: 1000 mL

## 2021-04-25 MED ORDER — CHLORHEXIDINE GLUCONATE CLOTH 2 % EX PADS: 6.0000 | MEDICATED_PAD | Freq: Once | CUTANEOUS | Status: DC

## 2021-04-25 MED ORDER — SODIUM CHLORIDE 0.9 % IV SOLN
2.0000 g | INTRAVENOUS | Status: AC
  Administered 2021-04-25: 2 g via INTRAVENOUS
  Filled 2021-04-25: qty 20

## 2021-04-25 MED ORDER — KETOROLAC TROMETHAMINE 15 MG/ML IJ SOLN
INTRAMUSCULAR | Status: DC | PRN
  Administered 2021-04-25: 15 mg via INTRAVENOUS

## 2021-04-25 MED ORDER — DEXAMETHASONE SODIUM PHOSPHATE 10 MG/ML IJ SOLN
INTRAMUSCULAR | Status: DC | PRN
  Administered 2021-04-25: 10 mg via INTRAVENOUS

## 2021-04-25 MED ORDER — LIDOCAINE 2% (20 MG/ML) 5 ML SYRINGE
INTRAMUSCULAR | Status: DC | PRN
  Administered 2021-04-25: 60 mg via INTRAVENOUS

## 2021-04-25 MED ORDER — ORAL CARE MOUTH RINSE: 15.0000 mL | Freq: Once | OROMUCOSAL | Status: AC

## 2021-04-25 MED ORDER — ONDANSETRON HCL 4 MG/2ML IJ SOLN: INTRAMUSCULAR | Status: DC | PRN

## 2021-04-25 MED ORDER — CHLORHEXIDINE GLUCONATE 0.12 % MT SOLN
15.0000 mL | Freq: Once | OROMUCOSAL | Status: AC
  Administered 2021-04-25: 15 mL via OROMUCOSAL

## 2021-04-25 MED ORDER — ROCURONIUM BROMIDE 10 MG/ML (PF) SYRINGE
PREFILLED_SYRINGE | INTRAVENOUS | Status: AC
  Filled 2021-04-25: qty 10

## 2021-04-25 MED ORDER — SUGAMMADEX SODIUM 200 MG/2ML IV SOLN
INTRAVENOUS | Status: DC | PRN
  Administered 2021-04-25: 200 mg via INTRAVENOUS

## 2021-04-25 MED ORDER — ROCURONIUM BROMIDE 10 MG/ML (PF) SYRINGE
PREFILLED_SYRINGE | INTRAVENOUS | Status: DC | PRN
  Administered 2021-04-25: 60 mg via INTRAVENOUS

## 2021-04-25 MED ORDER — FENTANYL CITRATE (PF) 250 MCG/5ML IJ SOLN
INTRAMUSCULAR | Status: AC
  Filled 2021-04-25: qty 5

## 2021-04-25 MED ORDER — ONDANSETRON HCL 4 MG PO TABS: 4.0000 mg | ORAL_TABLET | Freq: Three times a day (TID) | ORAL | 5 refills | Status: DC | PRN

## 2021-04-25 MED ORDER — KETOROLAC TROMETHAMINE 30 MG/ML IJ SOLN
INTRAMUSCULAR | Status: AC
  Filled 2021-04-25: qty 1

## 2021-04-25 MED ORDER — 0.9 % SODIUM CHLORIDE (POUR BTL) OPTIME
TOPICAL | Status: DC | PRN
  Administered 2021-04-25: 1000 mL

## 2021-04-25 MED ORDER — ACETAMINOPHEN 10 MG/ML IV SOLN
1000.0000 mg | Freq: Once | INTRAVENOUS | Status: DC | PRN
  Administered 2021-04-25: 1000 mg via INTRAVENOUS

## 2021-04-25 MED ORDER — BUPIVACAINE LIPOSOME 1.3 % IJ SUSP
INTRAMUSCULAR | Status: DC | PRN
  Administered 2021-04-25: 20 mL

## 2021-04-25 MED ORDER — DEXAMETHASONE SODIUM PHOSPHATE 10 MG/ML IJ SOLN: INTRAMUSCULAR | Status: DC | PRN

## 2021-04-25 MED ORDER — ENSURE PRE-SURGERY PO LIQD
296.0000 mL | Freq: Once | ORAL | Status: DC
  Filled 2021-04-25: qty 296

## 2021-04-25 MED ORDER — ONDANSETRON HCL 4 MG/2ML IJ SOLN
INTRAMUSCULAR | Status: DC | PRN
  Administered 2021-04-25: 4 mg via INTRAVENOUS

## 2021-04-25 SURGICAL SUPPLY — 47 items
APL PRP STRL LF DISP 70% ISPRP (MISCELLANEOUS) ×1
APPLIER CLIP 5 13 M/L LIGAMAX5 (MISCELLANEOUS) ×2
APR CLP MED LRG 5 ANG JAW (MISCELLANEOUS) ×1
BAG COUNTER SPONGE SURGICOUNT (BAG)
BAG SPEC RTRVL 10 TROC 200 (ENDOMECHANICALS) ×1
BAG SPNG CNTER NS LX DISP (BAG)
CABLE HIGH FREQUENCY MONO STRZ (ELECTRODE) ×2
CHLORAPREP W/TINT 26 (MISCELLANEOUS) ×2
COVER MAYO STAND STRL (DRAPES) ×2
COVER SURGICAL LIGHT HANDLE (MISCELLANEOUS) ×2
DECANTER SPIKE VIAL GLASS SM (MISCELLANEOUS)
DRAIN CHANNEL 19F RND (DRAIN)
DRAPE C-ARM 42X120 X-RAY (DRAPES) ×2
DRAPE WARM FLUID 44X44 (DRAPES) ×2
DRSG TEGADERM 2-3/8X2-3/4 SM (GAUZE/BANDAGES/DRESSINGS) ×2
DRSG TEGADERM 4X4.75 (GAUZE/BANDAGES/DRESSINGS) ×2
ELECT REM PT RETURN 15FT ADLT (MISCELLANEOUS) ×2
ENDOLOOP SUT PDS II  0 18 (SUTURE) ×2
ENDOLOOP SUT PDS II 0 18 (SUTURE) ×1
EVACUATOR SILICONE 100CC (DRAIN)
GAUZE SPONGE 2X2 8PLY STRL LF (GAUZE/BANDAGES/DRESSINGS) ×1
GLOVE SURG LTX SZ8 (GLOVE) ×2
GLOVE SURG UNDER LTX SZ8 (GLOVE) ×2
GOWN STRL REUS W/TWL XL LVL3 (GOWN DISPOSABLE) ×4
IRRIG SUCT STRYKERFLOW 2 WTIP (MISCELLANEOUS) ×2
KIT BASIN OR (CUSTOM PROCEDURE TRAY) ×2
KIT TURNOVER KIT A (KITS) ×2
NDL BIOPSY 14X6 SOFT TISS (NEEDLE) IMPLANT
NEEDLE BIOPSY 14X6 SOFT TISS (NEEDLE) ×2
PAD POSITIONING PINK XL (MISCELLANEOUS) ×2
PENCIL SMOKE EVACUATOR (MISCELLANEOUS)
POUCH RETRIEVAL ECOSAC 10 (ENDOMECHANICALS) ×1
POUCH RETRIEVAL ECOSAC 10MM (ENDOMECHANICALS) ×2
PROTECTOR NERVE ULNAR (MISCELLANEOUS)
SCISSORS LAP 5X35 DISP (ENDOMECHANICALS) ×2
SET CHOLANGIOGRAPH MIX (MISCELLANEOUS) ×2
SET TUBE SMOKE EVAC HIGH FLOW (TUBING) ×2
SHEARS HARMONIC ACE PLUS 36CM (ENDOMECHANICALS) ×2
SPONGE GAUZE 2X2 STER 10/PKG (GAUZE/BANDAGES/DRESSINGS) ×1
SUT MNCRL AB 4-0 PS2 18 (SUTURE) ×2
SUT PDS AB 1 CT1 27 (SUTURE) ×6
SYR 20ML LL LF (SYRINGE) ×2
TOWEL OR 17X26 10 PK STRL BLUE (TOWEL DISPOSABLE) ×2
TOWEL OR NON WOVEN STRL DISP B (DISPOSABLE) ×2
TRAY LAPAROSCOPIC (CUSTOM PROCEDURE TRAY) ×2
TROCAR BLADELESS OPT 5 100 (ENDOMECHANICALS) ×2
TROCAR BLADELESS OPT 5 150 (ENDOMECHANICALS) ×2

## 2021-04-25 NOTE — Anesthesia Postprocedure Evaluation (Signed)
Anesthesia Post Note  Patient: Alexandra Calderon  Procedure(s) Performed: LAPAROSCOPIC CHOLECYSTECTOMY SINGLE SITE WITH INTRAOPERATIVE CHOLANGIOGRAM, POSSIBLE NEEDLE CORE LIVER BX     Patient location during evaluation: PACU Anesthesia Type: General Level of consciousness: awake and alert, oriented and patient cooperative Pain management: pain level controlled Vital Signs Assessment: post-procedure vital signs reviewed and stable Respiratory status: spontaneous breathing, nonlabored ventilation and respiratory function stable Cardiovascular status: blood pressure returned to baseline and stable Postop Assessment: no apparent nausea or vomiting Anesthetic complications: no   No notable events documented.  Last Vitals:  Vitals:   04/25/21 1745 04/25/21 1800  BP: (!) 170/79 (!) 169/77  Pulse: 63 62  Resp: 12 14  Temp:    SpO2: 99% 98%    Last Pain:  Vitals:   04/25/21 1800  TempSrc:   PainSc: [place]

## 2021-04-25 NOTE — Interval H&P Note (Signed)
History and Physical Interval Note:  04/25/2021 3:30 PM  Alexandra Calderon  has presented today for surgery, with the diagnosis of SYMPTOMATIC BILARY COLIC, PROBABLE CHRONIC CHOLECYSTITIS.  The various methods of treatment have been discussed with the patient and family. After consideration of risks, benefits and other options for treatment, the patient has consented to  Procedure(s): [place] CHOLANGIOGRAM, POSSIBLE NEEDLE CORE LIVER BX (N/A) as a surgical intervention.  The patient's history has been reviewed, patient examined, no change in status, stable for surgery.  I have reviewed the patient's chart and labs.  Questions were answered to the patient's satisfaction.    I have re-reviewed the the patient's records, history, medications, and allergies.  I have re-examined the patient.  I again discussed intraoperative plans and goals of post-operative recovery.  The patient agrees to proceed.  Alexandra Calderon  Dec 02, 1940 [id number]  Patient Care Team: Susy Frizzle, MD as PCP - General (Family Medicine) Nahser, Wonda Cheng, MD as PCP - Cardiology (Cardiology) [name], [facility] as Pharmacist (Pharmacist) [name], MD as Consulting Physician (General Surgery) Gatha Mayer, MD as Consulting Physician (Gastroenterology)  Patient Active Problem List   Diagnosis Date Noted   PVC's (premature ventricular contractions) 02/10/2017   Irritable bowel syndrome 08/27/2012   Breast cancer of upper-inner quadrant of right female breast ([place]) 08/18/2011   Malignant tumor of breast ([place]) 10/13/2010   CYSTITIS, CHRONIC 03/29/2010   PALPITATIONS 09/19/2009   GERD 11/27/2008   Osteoarthritis 08/28/2008   ROTATOR CUFF TEAR 05/18/2008   Dyslipidemia 07/01/2007   Essential hypertension 04/07/2007   Disorder of bone and cartilage 04/07/2007    Past Medical History:  Diagnosis Date   Allergy    Arthritis    Breast cancer ([place])    right    Cataract    bilateral   Colon adenomas 2003,2006   Constipation    Diverticulosis    Dysrhythmia    Esophagitis    Fatty liver    GERD (gastroesophageal reflux disease)    Hearing loss    History of kidney stones    Hyperlipidemia    Hypertension    Irritable bowel syndrome 08/27/2012   Kidney stones    Osteopenia    Palpitations    Personal history of radiation therapy    Pneumonia    Polyp, stomach    PVC (premature ventricular contraction)    PVC (premature ventricular contraction)    UTI (lower urinary tract infection)     Past Surgical History:  Procedure Laterality Date   BREAST LUMPECTOMY Right 07/01/11   w/sentinal node bx - right side   COLONOSCOPY W/ BIOPSIES     DILATION AND CURETTAGE OF UTERUS     ESOPHAGOGASTRODUODENOSCOPY     kidney stones     shoulder arthroscopy rt 2009     SHOULDER SURGERY      Social History   Socioeconomic History   Marital status: Married    Spouse name: Not on file   Number of children: 2   Years of education: Not on file   Highest education level: Not on file  Occupational History   Occupation: retired  Tobacco Use   Smoking status: Never   Smokeless tobacco: Never  Vaping Use   Vaping Use: Never used  Substance and Sexual Activity   Alcohol use: No   Drug use: No   Sexual activity: Yes  Other Topics Concern   Not on file  Social History Narrative  Spouse:  [name]   Children:   [name]- age 91- [place]- age 83- [place]   Social Determinants of Health   Financial Resource Strain: Low Risk    Difficulty of Paying Living Expenses: Not very hard  Food Insecurity: Not on file  Transportation Needs: Not on file  Physical Activity: Not on file  Stress: Not on file  Social Connections: Not on file  Intimate Partner Violence: Not on file    Family History  Problem Relation Age of Onset   Lymphoma Father    Hodgkin's lymphoma Father    Colon cancer Mother        early 42's    Diabetes Maternal Aunt    Diabetes Paternal Grandmother    Colon polyps Neg Hx    Rectal cancer Neg Hx    Stomach cancer Neg Hx    Pancreatic cancer Neg Hx    Esophageal cancer Neg Hx     Facility-Administered Medications Prior to Admission  Medication Dose Route Frequency Provider Last Rate Last Admin   0.9 %  sodium chloride infusion  500 mL Intravenous Once Gatha Mayer, MD       Medications Prior to Admission  Medication Sig Dispense Refill Last Dose   alendronate (FOSAMAX) 70 MG tablet TAKE 1 TABLET BY MOUTH EVERY 7 DAYS WITH A FULL GLASS OF WATER ON AN EMPTY STOMACH. (Patient taking differently: Take 70 mg by mouth every Saturday.) 12 tablet 3 04/13/2021   atenolol (TENORMIN) 50 MG tablet TAKE 1 TABLET BY MOUTH TWICE A DAY (Patient taking differently: Take 50 mg by mouth 2 (two) times daily.) 180 tablet 3 04/25/2021 at 0730   Cholecalciferol (VITAMIN D) 50 MCG (2000 UT) CAPS Take 2,000 Units by mouth daily.   04/24/2021   docusate sodium (COLACE) 100 MG capsule Take 100 mg by mouth daily.   04/24/2021   losartan (COZAAR) 25 MG tablet Take 1 tablet (25 mg total) by mouth daily. 90 tablet 3 04/24/2021   omeprazole (PRILOSEC) 40 MG capsule TAKE 1 CAPSULE BY MOUTH EVERY DAY (Patient taking differently: Take 40 mg by mouth daily. TAKE 1 CAPSULE BY MOUTH EVERY DAY) 90 capsule 3 04/25/2021 at 0730   polyethylene glycol powder (GLYCOLAX/MIRALAX) 17 GM/SCOOP powder Take 1 Container by mouth once.   Past Week   spironolactone (ALDACTONE) 25 MG tablet TAKE 1 TABLET BY MOUTH EVERY DAY (Patient taking differently: Take 25 mg by mouth daily.) 90 tablet 2 04/24/2021   rosuvastatin (CRESTOR) 5 MG tablet TAKE 1 TABLET BY MOUTH EVERY DAY (Patient not taking: No sig reported) 90 tablet 3 Not Taking    Current Facility-Administered Medications  Medication Dose Route Frequency Provider Last Rate Last Admin   bupivacaine liposome (EXPAREL) 1.3 % injection 266 mg  20 mL Infiltration Once [name], MD        cefTRIAXone (ROCEPHIN) 2 g in sodium chloride 0.9 % 100 mL IVPB  2 g Intravenous On Call to OR [name], MD       And   metroNIDAZOLE (FLAGYL) IVPB 500 mg  500 mg Intravenous On Call to OR [name], MD       Chlorhexidine Gluconate Cloth 2 % PADS 6 each  6 each Topical Once [name], MD       And   Chlorhexidine Gluconate Cloth 2 % PADS 6 each  6 each Topical Once [name], MD       [START ON 04/26/2021] feeding supplement (  ENSURE PRE-SURGERY) liquid 296 mL  296 mL Oral Once [name], MD       lactated ringers infusion   Intravenous Continuous Lyn Hollingshead, MD 10 mL/hr at 04/25/21 1405 New Bag at 04/25/21 1405     Allergies  Allergen Reactions   Gatifloxacin     unknown   Nitrofurantoin Macrocrystal     unknown   Azithromycin Rash   Levofloxacin Rash    REACTION: rash    Sulfamethoxazole-Trimethoprim Rash and Other (See Comments)    Chills, fever   Sulfonamide Derivatives Rash and Other (See Comments)    Chills, fever    BP (!) 143/75   Pulse 69   Temp 98.7 F (37.1 C) (Oral)   Resp 16   LMP  (LMP Unknown)   SpO2 100%   Labs: Results for orders placed or performed during the hospital encounter of 04/25/21 (from the past 48 hour(s))  ABO/Rh     Status: None   Collection Time: 04/25/21  1:55 PM  Result Value Ref Range   ABO/RH(D)      AB POS Performed at [facility], [place] [address] [place], [place] [address]     Imaging / Studies: MR LIVER W WO CONTRAST  Result Date: 04/01/2021 CLINICAL DATA:  Evaluate liver lesion EXAM: MRI ABDOMEN WITHOUT AND WITH CONTRAST TECHNIQUE: Multiplanar multisequence MR imaging of the abdomen was performed both before and after the administration of intravenous contrast. CONTRAST:  55mL MULTIHANCE GADOBENATE DIMEGLUMINE 529 MG/ML IV SOLN COMPARISON:  Ultrasound from 03/08/2021 FINDINGS: Lower chest: No acute findings. Hepatobiliary: Exam detail limited by motion artifact. Diffuse hepatic  steatosis. Multiple T2 hyperintense and T1 hypointense liver lesions are identified predominantly involving the left hepatic lobe. -Dominant lesion is in the lateral segment of left hepatic lobe measuring 2.4 cm, image 32/14. Unchanged from 04/18/2020. There is no internal enhancement associated with this structure compatible with a benign cyst. -Within segment 4a there is a septated cyst measuring 1.3 cm, image 37/16. This is unchanged from the previous exam. -additional T2 hyperintense liver lesions appear unchanged from 04/18/2020 favoring a benign process. Sludge noted within the dependent portion of the gallbladder with possible tiny stones noted dependently. No gallbladder wall thickening or pericholecystic inflammation. No intrahepatic bile duct dilatation. The CBD is normal in caliber. Pancreas: No mass, inflammatory changes, or other parenchymal abnormality identified. Spleen:  Within normal limits in size and appearance. Adrenals/Urinary Tract: No masses identified. No evidence of hydronephrosis. Stomach/Bowel: Visualized portions within the abdomen are unremarkable. Vascular/Lymphatic: Aortic atherosclerosis. No aneurysm. The upper abdominal vasculature appears patent. No adenopathy. Other:  No free fluid or fluid collections. Musculoskeletal: No suspicious bone lesions identified. Right breast lesion containing biopsy clip is noted measuring 1.4 cm, image 2/16. IMPRESSION: 1. No acute findings within the abdomen. 2. Multiple benign liver lesions are identified, with imaging characteristics compatible with benign cysts. 3. Sludge noted within the gallbladder with possible tiny stones noted dependently. No gallbladder wall thickening or pericholecystic inflammation. 4. No suspicious enhancing liver lesions identified at this time. 5.  Aortic Atherosclerosis (ICD10-I70.0). Electronically Signed   By: Kerby Moors M.D.   On: 04/01/2021 19:56     .Adin Hector, M.D., F.A.C.S. Gastrointestinal and  Minimally Invasive Surgery [facility] [address], [place], [place] [address] [phone] Main / Paging  04/25/2021 3:30 PM    Adin Hector

## 2021-04-25 NOTE — H&P (Signed)
Alexandra Calderon DOB: 1941/06/22   Patient Care Team: Susy Frizzle, MD as PCP - General (Family Medicine) Nahser, Wonda Cheng, MD as PCP - Cardiology (Cardiology) [name], [facility] as Pharmacist (Pharmacist) [name], MD as Consulting Physician (General Surgery) Gatha Mayer, MD as Consulting Physician (Gastroenterology)    Patient sent for surgical consultation at the request of Jenna Luo, MD   Chief Complaint: Abdominal pain and increased liver function tests.  Possible gallstones. ` ` The patient is a pleasant woman that had nonalcoholic fatty liver disease followed by Dr. Carlean Purl.  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  4 by [employer].  Patient's had some intermittent episodes of upper abdominal pain radiating to the right upper quadrant and back.  He seemed a.m. after having oatmeal &blueberries.  She's had some occasional episodes of burning and cooling sensation in her upper abdomen as well but not severe.  She's had a few recurrent episodes.  She has a history of reflux on omeprazole.  Occasional burping.  That's been relatively stable with the medication.  The episodes last about an hour.  She moves her bowels about twice a week.  Not on any specific bowel regimen.  She did have some self-professed palpitations.  Seen by Dr. nausea or with cardiology.  Underwhelming Holter monitor with a few PVCs/PACs.  Stable on atenolol.  She can walk a couple miles without much difficulty.  She had an open right upper quadrant abdominal surgery for kidney stones in the 1960s.  No other abdominal surgeries.   Because of worsening symptoms surgical consultation offered.  She had sludge noted on ultrasound in the past.  Repeat showed gallstones.  She's had some documented mildly increased liver function tests.  Fatty liver noted.  Does not drink alcohol.  Her weight relatively stable.  Tends to avoid fast foods or rich meals anyway.   (Review of systems as  stated in this history (HPI) or in the review of systems.  Otherwise all other 12 point ROS are negative) ` ` ###########################################`   This patient encounter took 35 minutes today to perform the following: obtain history, perform exam, review outside records, interpret tests & imaging, counsel the patient on their diagnosis; and, document this encounter, including findings & plan in the electronic health record (EHR).     Diagnostic Studies History Janeann Forehand, CNA; 03/18/2021 9:53 AM) Colonoscopy  within last year Mammogram  within last year Pap Smear  >5 years ago   Allergies Janeann Forehand, CNA; 03/18/2021 9:55 AM) Sulfa 10 *OPHTHALMIC AGENTS*  Zithromax *MACROLIDES*  Allergies Reconciled    Family History Janeann Forehand, CNA; 03/18/2021 9:53 AM) Arthritis  Mother. Cancer  Father. Colon Cancer  Mother.   Pregnancy / Birth History Janeann Forehand, CNA; 03/18/2021 9:53 AM) Age of menopause  43-55 Gravida  2 Maternal age  17-20 Para  2   Other Problems Janeann Forehand, CNA; 03/18/2021 9:53 AM) Arthritis  Breast Cancer  Cholelithiasis  Gastroesophageal Reflux Disease  High blood pressure  Hypercholesterolemia          Review of Systems   General Not Present- Appetite Loss, Chills, Fatigue, Fever, Night Sweats, Weight Gain and Weight Loss. Skin Not Present- Change in Wart/Mole, Dryness, Hives, Jaundice, New Lesions, Non-Healing Wounds, Rash and Ulcer. HEENT Not Present- Earache, Hearing Loss, Hoarseness, Nose Bleed, Oral Ulcers, Ringing in the Ears, Seasonal Allergies, Sinus Pain, Sore Throat, Visual Disturbances, Wears glasses/contact lenses and Yellow Eyes. Respiratory Not Present- Bloody sputum, Chronic Cough, Difficulty  Breathing, Snoring and Wheezing. Breast Not Present- Breast Mass, Breast Pain, Nipple Discharge and Skin Changes. Cardiovascular Present- Leg Cramps and Palpitations. Not Present- Chest Pain, Difficulty Breathing Lying Down,  Rapid Heart Rate, Shortness of Breath and Swelling of Extremities. Gastrointestinal Present- Excessive gas. Not Present- Abdominal Pain, Bloating, Bloody Stool, Change in Bowel Habits, Chronic diarrhea, Constipation, Difficulty Swallowing, Gets full quickly at meals, Hemorrhoids, Indigestion, Nausea, Rectal Pain and Vomiting. Female Genitourinary Not Present- Frequency, Nocturia, Painful Urination, Pelvic Pain and Urgency. Musculoskeletal Not Present- Back Pain, Joint Pain, Joint Stiffness, Muscle Pain, Muscle Weakness and Swelling of Extremities. Neurological Not Present- Decreased Memory, Fainting, Headaches, Numbness, Seizures, Tingling, Tremor, Trouble walking and Weakness. Psychiatric Not Present- Anxiety, Bipolar, Change in Sleep Pattern, Depression, Fearful and Frequent crying. Endocrine Not Present- Cold Intolerance, Excessive Hunger, Hair Changes, Heat Intolerance, Hot flashes and New Diabetes. Hematology Not Present- Blood Thinners, Easy Bruising, Excessive bleeding, Gland problems, HIV and Persistent Infections.   Vitals (Donyelle Alston CNA; 03/18/2021 9:56 AM) 03/18/2021 9:55 AM Weight: 188.13 lb   Height: 66 in  Body Surface Area: 1.95 m   Body Mass Index: 30.36 kg/m   Temp.: 98 F    Pulse: 65 (Regular)    P.OX: 95% (Room air) BP: 140/70(Sitting, Left Arm, Standard)    04/25/2021 LMP  (LMP Unknown)              Physical Exam   General Mental Status - Alert. General Appearance - Not in acute distress, Not Sickly. Orientation - Oriented X3. Hydration - Well hydrated. Voice - Normal.   Integumentary Global Assessment Upon inspection and palpation of skin surfaces of the - Axillae: non-tender, no inflammation or ulceration, no drainage. and Distribution of scalp and body hair is normal. General Characteristics Temperature - normal warmth is noted.   Head and Neck Head - normocephalic, atraumatic with no lesions or palpable masses. Face Global Assessment - atraumatic,  no absence of expression. Neck Global Assessment - no abnormal movements, no bruit auscultated on the right, no bruit auscultated on the left, no decreased range of motion, non-tender. Trachea - midline. Thyroid Gland Characteristics - non-tender.   Eye Eyeball - Left - Extraocular movements intact, No Nystagmus - Left. Eyeball - Right - Extraocular movements intact, No Nystagmus - Right. Cornea - Left - No Hazy - Left. Cornea - Right - No Hazy - Right. Sclera/Conjunctiva - Left - No scleral icterus, No Discharge - Left. Sclera/Conjunctiva - Right - No scleral icterus, No Discharge - Right. Pupil - Left - Direct reaction to light normal. Pupil - Right - Direct reaction to light normal.   ENMT Ears Pinna - Left - no drainage observed, no generalized tenderness observed. Pinna - Right - no drainage observed, no generalized tenderness observed. Nose and Sinuses External Inspection of the Nose - no destructive lesion observed. Inspection of the nares - Left - quiet respiration. Inspection of the nares - Right - quiet respiration. Mouth and Throat Lips - Upper Lip - no fissures observed, no pallor noted. Lower Lip - no fissures observed, no pallor noted. Nasopharynx - no discharge present. Oral Cavity/Oropharynx - Tongue - no dryness observed. Oral Mucosa - no cyanosis observed. Hypopharynx - no evidence of airway distress observed.   Chest and Lung Exam Inspection Movements - Normal and Symmetrical. Accessory muscles - No use of accessory muscles in breathing. Palpation Palpation of the chest reveals - Non-tender. Auscultation Breath sounds - Normal and Clear.   Cardiovascular Auscultation Rhythm - Regular. Murmurs &  Other Heart Sounds - Auscultation of the heart reveals - No Murmurs and No Systolic Clicks.   Abdomen Inspection Inspection of the abdomen reveals - No Visible peristalsis and No Abnormal pulsations. Umbilicus - No Bleeding, No Urine  drainage. Palpation/Percussion Palpation and Percussion of the abdomen reveal - Soft, Non Tender, No Rebound tenderness, No Rigidity (guarding) and No Cutaneous hyperesthesia. Note:  Abdomen soft.  Right upper quadrant transverse incision on flank consistent with prior kidney surgery. Mild epigastric discomfort but no Murphy sign. Not severely distended.  No diastasis recti.  No umbilical or other anterior abdominal wall hernias   Female Genitourinary Sexual Maturity Tanner 5 - Adult hair pattern. Note:  No vaginal bleeding nor discharge   Peripheral Vascular Upper Extremity Inspection - Left - No Cyanotic nailbeds - Left, Not Ischemic. Inspection - Right - No Cyanotic nailbeds - Right, Not Ischemic.   Neurologic Neurologic evaluation reveals  - normal attention span and ability to concentrate, able to name objects and repeat phrases. Appropriate fund of knowledge , normal sensation and normal coordination. Mental Status Affect - not angry, not paranoid. Cranial Nerves - Normal Bilaterally. Gait - Normal.   Neuropsychiatric Mental status exam performed with findings of - able to articulate well with normal speech/language, rate, volume and coherence, thought content normal with ability to perform basic computations and apply abstract reasoning and no evidence of hallucinations, delusions, obsessions or homicidal/suicidal ideation.   Musculoskeletal Global Assessment Spine, Ribs and Pelvis - no instability, subluxation or laxity. Right Upper Extremity - no instability, subluxation or laxity.   Lymphatic Head & Neck   General Head & Neck Lymphatics: Bilateral - Description - No Localized lymphadenopathy. Axillary   General Axillary Region: Bilateral - Description - No Localized lymphadenopathy. Femoral & Inguinal   Generalized Femoral & Inguinal Lymphatics: Left - Description - No Localized lymphadenopathy. Right - Description - No Localized lymphadenopathy.       Assessment &  Plan    CHRONIC CHOLECYSTITIS WITH CALCULUS (K80.10) Impression: Rather classic story biliary colic triggered by food. Rest of differential diagnosis underwhelming.   I offered cholecystectomy & we had a long discussion about differential diagnosis, surgical intervention, etc.   Patient agrees to proceed. Husband agrees as well. There trying to find a time that will not be too inconvenient with her [place] travel plans. We will see if we can do this away safely.   She has rather good exercise tolerance. Had an underwhelming cardiac workup 2 years ago by Dr. nausea or. I do not think we need more aggressive evaluation.   The anatomy & physiology of hepatobiliary & pancreatic function was discussed.  The pathophysiology of gallbladder dysfunction was discussed.  Natural history risks without surgery was discussed.   I feel the risks of no intervention will lead to serious problems that outweigh the operative risks; therefore, I recommended cholecystectomy to remove the pathology.  I explained laparoscopic techniques with possible need for an open approach.  Probable cholangiogram to evaluate the bilary tract was explained as well.    Risks such as bleeding, infection, abscess, leak, injury to other organs, need for further treatment, heart attack, death, and other risks were discussed.  I noted a good likelihood this will help address the problem.  Possibility that this will not correct all abdominal symptoms was explained.  Goals of post-operative recovery were discussed as well.  We will work to minimize complications.  An educational handout further explaining the pathology and treatment options was given as well.  Questions were answered.  The patient expresses understanding & wishes to proceed with surgery.      IRRITABLE BOWEL SYNDROME WITH CONSTIPATION (K58.1) Impression: I encouraged her to be on a more aggressive bowel regimen to help correct her moderate constipation. Perhaps she will get  some increase after cholecystectomy.   Multiply defer to GI.      LIVER MASS, LEFT LOBE (R16.0) Impression: Questionable small mass on left hepatic liver lobe.  MRI shows some cysts but nothing particularly suspicious.  Reassuring.  Defer to primary care.  INTERMITTENT PALPITATIONS (R00.2) Impression: History of feeling of palpitations with negative Holter monitor 2019 and good exercise tolerance. Evaluation by Dr. nausea. Given this a low risk surgery, I do not feel strongly she needs cardiac clearance.     NAFLD (NONALCOHOLIC FATTY LIVER DISEASE) (K76.0) Impression: Mildly increased liver function tests with fatty liver disease suspected on imaging. May consider needle biopsy while there are. See what MRI shows. We will see. Different GI. She is already abstinent of alcohol and is trying to keep her rate down. Defer medication list concerns but nothing that striking on my evaluation.     HISTORY OF ADENOMATOUS POLYP OF COLON (Z86.010) Impression: History of adenomatous polyps on colonoscopy 2016. Recent one last year by Dr. Nicky Pugh.   Current Plans Pt Education - Polyps in the Colon and Rectum (Colonic and Rectal Polyps): colonic polyps   GERD (GASTROESOPHAGEAL REFLUX DISEASE) (K21.9) Impression: History of heartburn and gastric esophageal reflux disease stable on omeprazole. Symptoms are NOT consistent with that. Defer to GI.   Adin Hector, MD, FACS, MASCRS Esophageal, Gastrointestinal & Colorectal Surgery Robotic and Minimally Invasive Surgery  [facility], [place]  [place]. [address] [place], [place] [address] [phone] Fax [phone] Main  CONTACT INFORMATION:  Weekday (9AM-5PM): Call [facility] main office at [phone]  Weeknight (5PM-9AM) or Weekend/Holiday: Check [email or web address] (password " [email or web address]") for General Surgery [facility] coverage  (Please, do not use SecureChat as it is not reliable  communication to operating surgeons for immediate patient care)

## 2021-04-25 NOTE — Op Note (Signed)
04/25/2021  PATIENT:  Alexandra Calderon  80 y.o. female  Patient Care Team: Susy Frizzle, MD as PCP - General (Family Medicine) Nahser, Wonda Cheng, MD as PCP - Cardiology (Cardiology) [name], [facility] as Pharmacist (Pharmacist) [name], MD as Consulting Physician (General Surgery) Gatha Mayer, MD as Consulting Physician (Gastroenterology)  PRE-OPERATIVE DIAGNOSIS:    Chronic Cholecystitis  POST-OPERATIVE DIAGNOSIS:   Chronic Calculus cholecystitis Liver: Fatty steatohepatitis  PROCEDURE:  Core Liver Biopsy (CPT code 47001) & SINGLE SITE Laparoscopic cholecystectomy with intraoperative cholangiogram (CPT code 5122582042)  SURGEON:  Adin Hector, MD, FACS.  ASSISTANT: OR Staff   ANESTHESIA:    General with endotracheal intubation Local anesthetic as a field block  EBL:  (See Anesthesia Intraoperative Record) Total I/O In: 700 [I.V.:700] Out: 175 [Urine:150; Blood:25]  Delay start of Pharmacological VTE agent (>24hrs) due to surgical blood loss or risk of bleeding:  no  DRAINS: None   SPECIMEN: Gallbladder & Core liver biopsies    DISPOSITION OF SPECIMEN:  PATHOLOGY  COUNTS:  YES  PLAN OF CARE: Discharge to home after PACU  PATIENT DISPOSITION:  PACU - hemodynamically stable.  INDICATION: Patient with symptoms suspicious for biliary colic.  Has chronic reflux and other abdominal issues.  After further treatments with persistent symptoms I offered cholecystectomy.  The anatomy & physiology of hepatobiliary & pancreatic function was discussed.  The pathophysiology of gallbladder dysfunction was discussed.  Natural history risks without surgery was discussed.   I feel the risks of no intervention will lead to serious problems that outweigh the operative risks; therefore, I recommended cholecystectomy to remove the pathology.  I explained laparoscopic techniques with possible need for an open approach.  Probable cholangiogram to evaluate the bilary tract was  explained as well.    Risks such as bleeding, infection, abscess, leak, injury to other organs, need for further treatment, heart attack, death, and other risks were discussed.  I noted a good likelihood this will help address the problem.  Possibility that this will not correct all abdominal symptoms was explained.  Goals of post-operative recovery were discussed as well.  We will work to minimize complications.  An educational handout further explaining the pathology and treatment options was given as well.  Questions were answered.  The patient expresses understanding & wishes to proceed with surgery.  OR FINDINGS: Moderate adhesions to gallbladder with thickening consistent with chronic cholecystitis.  Sand-like stones in gallbladder.  Very narrowed biliary system with cystic duct narrowing/stricturing.  Otherwise normal biliary anatomy.  Liver: Fatty change on the liver suspicious for mild steatohepatitis.  Core liver biopsies done.  DESCRIPTION:   The patient was identified & brought in the operating room. The patient was positioned supine with arms tucked. SCDs were active during the entire case. The patient underwent general anesthesia without any difficulty.  The abdomen was prepped and draped in a sterile fashion. A Surgical Timeout confirmed our plan.  I made a transverse curvilinear incision through the superior umbilical fold.  I placed a 58mm long port through the supraumbilical fascia using a modified Hassan cutdown technique with umbilical stalk fascial countertraction. I began carbon dioxide insufflation.  No change in end tidal CO2 measurement.   Camera inspection revealed no injury. There were no adhesions to the anterior abdominal wall supraumbilically.  A few small wispy adhesions in the right lower quadrant of omentum.  These were left alone I proceeded to continue with single site technique. I placed a #5 port in  left upper aspect of the wound. I placed a 5 mm atraumatic grasper  in the right inferior aspect of the wound.  I turned attention to the right upper quadrant.  I freed omental and mesocolon adhesions to the gallbladder to better expose the dome.    The gallbladder fundus was elevated cephalad. I freed adhesions to the ventral surface of the gallbladder off carefully.  I had to decompress and aspirated bile and some stones to grab it consistent with at least chronic cholecystitis.  Suspicious for perhaps an acute flare as well.  I freed the peritoneal coverings between the gallbladder and the liver on the posteriolateral and anteriomedial walls. I alternated between Harmonic & blunt Maryland dissection to help get a good critical view of the cystic artery and cystic duct.  did further dissection to free allof the gallbladder off the liver bed to get a good critical view of the infundibulum and cystic duct. I dissected out the cystic artery; and, after getting a good 360 view, ligated the anterior & posterior branches of the cystic artery close on the infundibulum using the Harmonic ultrasonic dissection.  I skeletonized the cystic duct.  I placed a clip on the infundibulum. I did a partial cystic duct-otomy and ensured patency. I placed a 5 [place] cholangiocatheter through a puncture site at the right subcostal ridge of the abdominal wall and directed it into the cystic duct.  We ran a cholangiogram with dilute radio-opaque contrast and continuous fluoroscopy. Contrast flowed from a side branch consistent with cystic duct cannulization. Contrast flowed up the common hepatic duct into the right and left intrahepatic chains out to secondary radicals. Contrast flowed down the common bile duct easily across the normal ampulla into the duodenum.  This was consistent with a normal cholangiogram.  I removed the cholangiocatheter. I ligated the cystic duct using a 0 PDS Endoloop lassoed around the gallbladder to come down to the base with good ligation of the proximal cystic duct.  I  then placed clips on the cystic duct x4.  I completed cystic duct transection. I freed the gallbladder from its remaining attachments to the liver.    Because of fatty change in liver with some increased liver function tests I decided to do core liver biopsies.  I did this with a 14-gauge Tru-Cut needle with 3 passes and got to 3 get good cores.  Assured hemostasis of the liver bed.  Placed the gallbladder inside an EcoSac..  I ensured hemostasis on the gallbladder fossa of the liver and elsewhere. I inspected the rest of the abdomen & detected no injury nor bleeding elsewhere.  I removed the gallbladder out the supraumbilical fascia. I closed the fascia transversely using #1 PDS interrupted stitches. I closed the skin using 4-0 monocryl stitch.  Sterile dressing was applied. The patient was extubated & arrived in the PACU in stable condition..  I had discussed postoperative care with the patient in the holding area. I discussed operative findings, updated the patient's status, discussed probable steps to recovery, and gave postoperative recommendations to the patient's spouse, [name].  Recommendations were made.  Questions were answered.  He expressed understanding & appreciation.  Adin Hector, M.D., F.A.C.S. Gastrointestinal and Minimally Invasive Surgery [facility] [address], [place], [place] [address] [phone] Main / Paging  04/25/2021 5:29 PM

## 2021-04-25 NOTE — Transfer of Care (Signed)
Immediate Anesthesia Transfer of Care Note  Patient: Alexandra Calderon  Procedure(s) Performed: LAPAROSCOPIC CHOLECYSTECTOMY SINGLE SITE WITH INTRAOPERATIVE CHOLANGIOGRAM, POSSIBLE NEEDLE CORE LIVER BX  Patient Location: PACU  Anesthesia Type:General  Level of Consciousness: sedated, patient cooperative and responds to stimulation  Airway & Oxygen Therapy: Patient Spontanous Breathing and Patient connected to face mask oxygen  Post-op Assessment: Report given to RN and Post -op Vital signs reviewed and stable  Post vital signs: Reviewed and stable  Last Vitals:  Vitals Value Taken Time  BP    Temp    Pulse    Resp    SpO2      Last Pain:  Vitals:   04/25/21 1400  TempSrc: Oral  PainSc: 0-No pain         Complications: No notable events documented.

## 2021-04-25 NOTE — Anesthesia Procedure Notes (Signed)
Procedure Name: Intubation Date/Time: 04/25/2021 3:49 PM Performed by: Sharlette Dense, CRNA Patient Re-evaluated:Patient Re-evaluated prior to induction Oxygen Delivery Method: Circle system utilized Preoxygenation: Pre-oxygenation with 100% oxygen Induction Type: IV induction Ventilation: Mask ventilation without difficulty and Oral airway inserted - appropriate to patient size Laryngoscope Size: Glidescope and 4 Grade View: Grade I Tube type: Oral Tube size: 7.5 mm Number of attempts: 1 Airway Equipment and Method: Video-laryngoscopy and Rigid stylet Placement Confirmation: ETT inserted through vocal cords under direct vision, positive ETCO2 and breath sounds checked- equal and bilateral Secured at: 22 cm Tube secured with: Tape Dental Injury: Teeth and Oropharynx as per pre-operative assessment  Difficulty Due To: Difficulty was anticipated, Difficult Airway- due to anterior larynx and Difficult Airway- due to limited oral opening Future Recommendations: Recommend- induction with short-acting agent, and alternative techniques readily available

## 2021-04-26 ENCOUNTER — Ambulatory Visit (INDEPENDENT_AMBULATORY_CARE_PROVIDER_SITE_OTHER): Payer: Medicare HMO | Admitting: Nurse Practitioner

## 2021-04-26 ENCOUNTER — Other Ambulatory Visit: Payer: Self-pay

## 2021-04-26 ENCOUNTER — Encounter (HOSPITAL_COMMUNITY): Payer: Self-pay | Admitting: Surgery

## 2021-04-26 ENCOUNTER — Telehealth: Payer: Self-pay | Admitting: *Deleted

## 2021-04-26 VITALS — BP 116/74 | HR 65 | Temp 98.5°F | Ht 66.0 in | Wt 182.8 lb

## 2021-04-26 DIAGNOSIS — K8010 Calculus of gallbladder with chronic cholecystitis without obstruction: Secondary | ICD-10-CM | POA: Diagnosis not present

## 2021-04-26 DIAGNOSIS — K760 Fatty (change of) liver, not elsewhere classified: Secondary | ICD-10-CM | POA: Diagnosis not present

## 2021-04-26 DIAGNOSIS — R319 Hematuria, unspecified: Secondary | ICD-10-CM | POA: Diagnosis not present

## 2021-04-26 LAB — URINALYSIS, ROUTINE W REFLEX MICROSCOPIC
Bacteria, UA: NONE SEEN /HPF
Bilirubin Urine: NEGATIVE
Glucose, UA: NEGATIVE
Hyaline Cast: NONE SEEN /LPF
Ketones, ur: NEGATIVE
Leukocytes,Ua: NEGATIVE
Nitrite: NEGATIVE
Protein, ur: NEGATIVE
Specific Gravity, Urine: 1.025 (ref 1.001–1.035)
WBC, UA: NONE SEEN /HPF (ref 0–5)
pH: 5.5 (ref 5.0–8.0)

## 2021-04-26 LAB — MICROSCOPIC MESSAGE

## 2021-04-26 NOTE — Progress Notes (Signed)
Subjective:    Patient ID: Alexandra Calderon, female    DOB: [birth year], 80 y.o.   MRN: [id number]  HPI: Alexandra Calderon is a 80 y.o. female presenting for UTI   Chief Complaint  Patient presents with   urine problem    Use otc urine stick to chk for uti, this reoccurring. Just had gallbladder surgery yesterday   URINARY SYMPTOMS Patient reports she felt her urine smelled terrible a couple of weeks ago.  This got better over time.  Reports yesterday, she had her gall bladder removed.  Today, she decided to get some Azo strips and test her urine and the strips showed a UTI.  Duration: weeks Dysuria: no Urinary frequency: no Urgency: no Small volume voids: no Symptom severity: mild Urinary incontinence: yes Foul odor: no Hematuria: yes Abdominal pain: yes Back pain: no Suprapubic pain/pressure: no Flank pain: no Fever:  no Nausea: no Vomiting: no Relief with cranberry juice: no Relief with pyridium: no Status: better Previous urinary tract infection: yes Recurrent urinary tract infection: no She is currently sexually active with 1 female partner and does report it feels like she gets a UTI every time after she has sex.   Allergies  Allergen Reactions   Gatifloxacin     unknown   Nitrofurantoin Macrocrystal     unknown   Azithromycin Rash   Levofloxacin Rash    REACTION: rash    Sulfamethoxazole-Trimethoprim Rash and Other (See Comments)    Chills, fever   Sulfonamide Derivatives Rash and Other (See Comments)    Chills, fever    Outpatient Encounter Medications as of 04/26/2021  Medication Sig   alendronate (FOSAMAX) 70 MG tablet TAKE 1 TABLET BY MOUTH EVERY 7 DAYS WITH A FULL GLASS OF WATER ON AN EMPTY STOMACH. (Patient taking differently: Take 70 mg by mouth every Saturday.)   atenolol (TENORMIN) 50 MG tablet TAKE 1 TABLET BY MOUTH TWICE A DAY (Patient taking differently: Take 50 mg by mouth 2 (two) times daily.)   Cholecalciferol (VITAMIN D) 50 MCG (2000 UT)  CAPS Take 2,000 Units by mouth daily.   docusate sodium (COLACE) 100 MG capsule Take 100 mg by mouth daily.   losartan (COZAAR) 25 MG tablet Take 1 tablet (25 mg total) by mouth daily.   omeprazole (PRILOSEC) 40 MG capsule TAKE 1 CAPSULE BY MOUTH EVERY DAY (Patient taking differently: Take 40 mg by mouth daily. TAKE 1 CAPSULE BY MOUTH EVERY DAY)   ondansetron (ZOFRAN) 4 MG tablet Take 1 tablet (4 mg total) by mouth every 8 (eight) hours as needed for nausea.   polyethylene glycol powder (GLYCOLAX/MIRALAX) 17 GM/SCOOP powder Take 1 Container by mouth once.   rosuvastatin (CRESTOR) 5 MG tablet TAKE 1 TABLET BY MOUTH EVERY DAY   spironolactone (ALDACTONE) 25 MG tablet TAKE 1 TABLET BY MOUTH EVERY DAY (Patient taking differently: Take 25 mg by mouth daily.)   traMADol (ULTRAM) 50 MG tablet Take 1-2 tablets (50-100 mg total) by mouth every 6 (six) hours as needed for moderate pain or severe pain.   Facility-Administered Encounter Medications as of 04/26/2021  Medication   0.9 %  sodium chloride infusion    Patient Active Problem List   Diagnosis Date Noted   Personal history of breast cancer 09/16/2017   PVC's (premature ventricular contractions) 02/10/2017   Irritable bowel syndrome 08/27/2012   Breast cancer of upper-inner quadrant of right female breast ([place]) 08/18/2011   Malignant tumor of breast ([place]) 10/13/2010   CYSTITIS, CHRONIC  03/29/2010   PALPITATIONS 09/19/2009   GERD 11/27/2008   Osteoarthritis 08/28/2008   ROTATOR CUFF TEAR 05/18/2008   Dyslipidemia 07/01/2007   Essential hypertension 04/07/2007   Disorder of bone and cartilage 04/07/2007    Past Medical History:  Diagnosis Date   Allergy    Arthritis    Breast cancer ([place])    right   Cataract    bilateral   Colon adenomas 2003,2006   Constipation    Diverticulosis    Dysrhythmia    Esophagitis    Fatty liver    GERD (gastroesophageal reflux disease)    Hearing loss    History of kidney stones     Hyperlipidemia    Hypertension    Irritable bowel syndrome 08/27/2012   Kidney stones    Osteopenia    Palpitations    Personal history of radiation therapy    Pneumonia    Polyp, stomach    PVC (premature ventricular contraction)    PVC (premature ventricular contraction)    UTI (lower urinary tract infection)     Relevant past medical, surgical, family and social history reviewed and updated as indicated. Interim medical history since our last visit reviewed.  Review of Systems Per HPI unless specifically indicated above     Objective:    BP 116/74   Pulse 65   Temp 98.5 F (36.9 C)   Ht 5\' 6"  (1.676 m)   Wt 182 lb 12.8 oz (82.9 kg)   LMP  (LMP Unknown)   SpO2 92%   BMI 29.50 kg/m   Wt Readings from Last 3 Encounters:  04/26/21 182 lb 12.8 oz (82.9 kg)  04/18/21 182 lb 6 oz (82.7 kg)  03/05/21 192 lb (87.1 kg)    Physical Exam Vitals and nursing note reviewed.  Constitutional:      General: She is not in acute distress.    Appearance: Normal appearance. She is not toxic-appearing.  HENT:     Head: Normocephalic and atraumatic.  Cardiovascular:     Rate and Rhythm: Normal rate.  Pulmonary:     Effort: Pulmonary effort is normal. No respiratory distress.  Abdominal:     General: Abdomen is flat. There is no distension.     Palpations: Abdomen is soft.     Tenderness: There is no abdominal tenderness.     Comments: Surgical incision to umbilicus and right upper quadrant   Skin:    General: Skin is warm and dry.     Capillary Refill: Capillary refill takes less than 2 seconds.     Coloration: Skin is not jaundiced or pale.     Findings: No erythema.  Neurological:     Mental Status: She is alert and oriented to person, place, and time.     Motor: No weakness.     Gait: Gait normal.  Psychiatric:        Mood and Affect: Mood normal.        Behavior: Behavior normal.        Thought Content: Thought content normal.        Judgment: Judgment normal.       Assessment & Plan:  1. Hematuria, unspecified type Acute.  UA in clinic today showed trace intact blood, 0-2 RBC, 0-5 epithelial cells.  Will send for culture per patient request and hold off on treatment for UTI for now.  History and symptoms are not consistent with UTI.   - Urinalysis, Routine w reflex microscopic - Urine Culture  Follow up plan: Return if symptoms worsen or fail to improve.

## 2021-04-26 NOTE — Telephone Encounter (Signed)
Received call from patient.   Reports that she feels she has blood in urine. States that she felt she was starting to have Sx of UTI x2 weeks ago, but Sx then resolved.   States that she had surgery on 04/25/2021 for gallbladder removal. Reports that she noticed urine was dark today. Reports that she used AZO strip which turned dark pink.   Advised that she requires appointment for evaluation. Appointment scheduled.

## 2021-04-26 NOTE — Telephone Encounter (Signed)
Agree with plan and disposition.

## 2021-04-26 NOTE — Patient Instructions (Signed)
D-mannose is the urinary supplement

## 2021-04-27 LAB — URINE CULTURE
MICRO NUMBER:: 12125015
Result:: NO GROWTH
SPECIMEN QUALITY:: ADEQUATE

## 2021-04-30 ENCOUNTER — Encounter (HOSPITAL_COMMUNITY): Payer: Self-pay | Admitting: Surgery

## 2021-05-02 ENCOUNTER — Other Ambulatory Visit: Payer: Self-pay | Admitting: Family Medicine

## 2021-05-02 ENCOUNTER — Encounter: Payer: Self-pay | Admitting: Family Medicine

## 2021-05-02 MED ORDER — CEPHALEXIN 500 MG PO CAPS: 500.0000 mg | ORAL_CAPSULE | Freq: Three times a day (TID) | ORAL | 0 refills | Status: DC

## 2021-05-08 ENCOUNTER — Encounter (HOSPITAL_COMMUNITY): Payer: Self-pay

## 2021-05-08 LAB — SURGICAL PATHOLOGY

## 2021-05-16 ENCOUNTER — Other Ambulatory Visit: Payer: Self-pay | Admitting: Cardiovascular Disease

## 2021-05-18 ENCOUNTER — Other Ambulatory Visit: Payer: Self-pay

## 2021-05-18 ENCOUNTER — Telehealth: Payer: Self-pay | Admitting: Physician Assistant

## 2021-05-18 ENCOUNTER — Encounter (HOSPITAL_COMMUNITY): Payer: Self-pay | Admitting: Emergency Medicine

## 2021-05-18 ENCOUNTER — Emergency Department (HOSPITAL_COMMUNITY): Payer: Medicare HMO

## 2021-05-18 ENCOUNTER — Emergency Department (HOSPITAL_COMMUNITY)
Admission: EM | Admit: 2021-05-18 | Discharge: 2021-05-18 | Disposition: A | Discharge status: Home / Self Care | Payer: Medicare HMO | Attending: Emergency Medicine | Admitting: Emergency Medicine

## 2021-05-18 DIAGNOSIS — Z79899 Other long term (current) drug therapy: Secondary | ICD-10-CM | POA: Insufficient documentation

## 2021-05-18 DIAGNOSIS — R002 Palpitations: Secondary | ICD-10-CM | POA: Diagnosis present

## 2021-05-18 DIAGNOSIS — I10 Essential (primary) hypertension: Secondary | ICD-10-CM | POA: Insufficient documentation

## 2021-05-18 DIAGNOSIS — Z20822 Contact with and (suspected) exposure to covid-19: Secondary | ICD-10-CM | POA: Diagnosis not present

## 2021-05-18 DIAGNOSIS — I4891 Unspecified atrial fibrillation: Secondary | ICD-10-CM

## 2021-05-18 DIAGNOSIS — Z853 Personal history of malignant neoplasm of breast: Secondary | ICD-10-CM | POA: Diagnosis not present

## 2021-05-18 LAB — BASIC METABOLIC PANEL WITH GFR
Anion gap: 10 (ref 5–15)
BUN: 18 mg/dL (ref 8–23)
CO2: 26 mmol/L (ref 22–32)
Calcium: 9.4 mg/dL (ref 8.9–10.3)
Chloride: 101 mmol/L (ref 98–111)
Creatinine, Ser: 1.19 mg/dL — ABNORMAL HIGH (ref 0.44–1.00)
GFR, Estimated: 47 mL/min — ABNORMAL LOW
Glucose, Bld: 166 mg/dL — ABNORMAL HIGH (ref 70–99)
Potassium: 4 mmol/L (ref 3.5–5.1)
Sodium: 137 mmol/L (ref 135–145)

## 2021-05-18 LAB — CBC
HCT: 44 % (ref 36.0–46.0)
Hemoglobin: 14 g/dL (ref 12.0–15.0)
MCH: 28.9 pg (ref 26.0–34.0)
MCHC: 31.8 g/dL (ref 30.0–36.0)
MCV: 90.7 fL (ref 80.0–100.0)
Platelets: 317 10*3/uL (ref 150–400)
RBC: 4.85 MIL/uL (ref 3.87–5.11)
RDW: 13.6 % (ref 11.5–15.5)
WBC: 5.2 10*3/uL (ref 4.0–10.5)
nRBC: 0 % (ref 0.0–0.2)

## 2021-05-18 LAB — RESP PANEL BY RT-PCR (FLU A&B, COVID) ARPGX2
Influenza A by PCR: NEGATIVE
Influenza B by PCR: NEGATIVE
SARS Coronavirus 2 by RT PCR: NEGATIVE

## 2021-05-18 LAB — MAGNESIUM: Magnesium: 2 mg/dL (ref 1.7–2.4)

## 2021-05-18 LAB — TSH: TSH: 2.201 u[IU]/mL (ref 0.350–4.500)

## 2021-05-18 MED ORDER — APIXABAN 5 MG PO TABS: 5.0000 mg | ORAL_TABLET | Freq: Two times a day (BID) | ORAL | 0 refills | Status: DC

## 2021-05-18 MED ORDER — DILTIAZEM HCL-DEXTROSE 125-5 MG/125ML-% IV SOLN (PREMIX)
5.0000 mg/h | INTRAVENOUS | Status: DC
  Administered 2021-05-18: 5 mg/h via INTRAVENOUS
  Filled 2021-05-18: qty 125

## 2021-05-18 MED ORDER — APIXABAN (ELIQUIS) EDUCATION KIT FOR DVT/PE PATIENTS: PACK | Freq: Once | Status: AC

## 2021-05-18 MED ORDER — DILTIAZEM HCL 25 MG/5ML IV SOLN: 15.0000 mg | Freq: Once | INTRAVENOUS | Status: DC

## 2021-05-18 MED ORDER — DILTIAZEM LOAD VIA INFUSION
15.0000 mg | Freq: Once | INTRAVENOUS | Status: AC
  Administered 2021-05-18: 15 mg via INTRAVENOUS
  Filled 2021-05-18: qty 15

## 2021-05-18 MED ORDER — SODIUM CHLORIDE 0.9 % IV BOLUS
1000.0000 mL | Freq: Once | INTRAVENOUS | Status: AC
  Administered 2021-05-18: 1000 mL via INTRAVENOUS

## 2021-05-18 MED ORDER — APIXABAN 5 MG PO TABS
5.0000 mg | ORAL_TABLET | Freq: Once | ORAL | Status: AC
  Administered 2021-05-18: 5 mg via ORAL
  Filled 2021-05-18: qty 1

## 2021-05-18 NOTE — Discharge Instructions (Addendum)
Continue your atenolol.  Take your next dose of Eliquis prior to bedtime if you are able to get this medication filled today, would be okay to take tomorrow morning as well.  Follow-up in atrial fibrillation clinic.

## 2021-05-18 NOTE — ED Triage Notes (Signed)
Pt states she got up to use bathroom around 3am and felt like heart was fluttering.  She coughed and it didn't improve.  Put on her Apple watch that showed Afib.  History of palpitations but no prior history of afib. Denies chest pain, sob, and dizziness.  States it feels like heart racing right now.

## 2021-05-18 NOTE — ED Provider Notes (Signed)
[facility] EMERGENCY DEPARTMENT Provider Note   CSN: [id number] Arrival date & time: 05/18/21  0921     History Chief Complaint  Patient presents with   Palpitations    Alexandra Calderon is a 80 y.o. female.  Patient states that she has had some intermittent palpitations recently but has had a fairly consistent feeling of palpitations since 3 AM.  Her apple watch told her that she might of been in atrial fibrillation.  Does not have a history of the same.  No chest pain or shortness of breath.  Did have her gallbladder removed several weeks ago.  The history is provided by the patient.  Palpitations Palpitations quality:  Fast Onset quality:  Gradual Duration:  7 hours Timing:  Intermittent Progression:  Waxing and waning Chronicity:  Recurrent Relieved by:  Nothing Worsened by:  Nothing Associated symptoms: no back pain, no chest pain, no cough, no shortness of breath and no vomiting   Risk factors: no hx of atrial fibrillation       Past Medical History:  Diagnosis Date   Allergy    Arthritis    Breast cancer ([place])    right   Cataract    bilateral   Colon adenomas 2003,2006   Constipation    Diverticulosis    Dysrhythmia    Esophagitis    Fatty liver    GERD (gastroesophageal reflux disease)    Hearing loss    History of kidney stones    Hyperlipidemia    Hypertension    Irritable bowel syndrome 08/27/2012   Kidney stones    Osteopenia    Palpitations    Personal history of radiation therapy    Pneumonia    Polyp, stomach    PVC (premature ventricular contraction)    PVC (premature ventricular contraction)    UTI (lower urinary tract infection)     Patient Active Problem List   Diagnosis Date Noted   Personal history of breast cancer 09/16/2017   PVC's (premature ventricular contractions) 02/10/2017   Irritable bowel syndrome 08/27/2012   Breast cancer of upper-inner quadrant of right female breast ([place]) 08/18/2011   Malignant  tumor of breast ([place]) 10/13/2010   CYSTITIS, CHRONIC 03/29/2010   PALPITATIONS 09/19/2009   GERD 11/27/2008   Osteoarthritis 08/28/2008   ROTATOR CUFF TEAR 05/18/2008   Dyslipidemia 07/01/2007   Essential hypertension 04/07/2007   Disorder of bone and cartilage 04/07/2007    Past Surgical History:  Procedure Laterality Date   BREAST LUMPECTOMY Right 07/01/11   w/sentinal node bx - right side   COLONOSCOPY W/ BIOPSIES     DILATION AND CURETTAGE OF UTERUS     ESOPHAGOGASTRODUODENOSCOPY     kidney stones     LAPAROSCOPIC CHOLECYSTECTOMY SINGLE SITE WITH INTRAOPERATIVE CHOLANGIOGRAM N/A 04/25/2021   Procedure: LAPAROSCOPIC CHOLECYSTECTOMY SINGLE SITE WITH INTRAOPERATIVE CHOLANGIOGRAM, POSSIBLE NEEDLE CORE LIVER BX;  Surgeon: [name], MD;  Location: [facility];  Service: General;  Laterality: N/A;   shoulder arthroscopy rt 2009     SHOULDER SURGERY       OB History   No obstetric history on file.     Family History  Problem Relation Age of Onset   Lymphoma Father    Hodgkin's lymphoma Father    Colon cancer Mother        early 76's   Diabetes Maternal Aunt    Diabetes Paternal Grandmother    Colon polyps Neg Hx    Rectal cancer Neg Hx  Stomach cancer Neg Hx    Pancreatic cancer Neg Hx    Esophageal cancer Neg Hx     Social History   Tobacco Use   Smoking status: Never   Smokeless tobacco: Never  Vaping Use   Vaping Use: Never used  Substance Use Topics   Alcohol use: No   Drug use: No    Home Medications Prior to Admission medications   Medication Sig Start Date End Date Taking? Authorizing Provider  apixaban (ELIQUIS) 5 MG TABS tablet Take 1 tablet (5 mg total) by mouth 2 (two) times daily. 05/18/21 06/17/21 Yes Kenyana Husak, DO  alendronate (FOSAMAX) 70 MG tablet TAKE 1 TABLET BY MOUTH EVERY 7 DAYS WITH A FULL GLASS OF WATER ON AN EMPTY STOMACH. Patient taking differently: Take 70 mg by mouth every Saturday. 07/09/20   Susy Frizzle, MD  atenolol  (TENORMIN) 50 MG tablet TAKE 1 TABLET BY MOUTH TWICE A DAY Patient taking differently: Take 50 mg by mouth 2 (two) times daily. 07/04/20   Susy Frizzle, MD  cephALEXin (KEFLEX) 500 MG capsule Take 1 capsule (500 mg total) by mouth 3 (three) times daily. 05/02/21   Susy Frizzle, MD  Cholecalciferol (VITAMIN D) 50 MCG (2000 UT) CAPS Take 2,000 Units by mouth daily.    [provider]  docusate sodium (COLACE) 100 MG capsule Take 100 mg by mouth daily.    [provider]  losartan (COZAAR) 25 MG tablet Take 1 tablet (25 mg total) by mouth daily. 06/26/20   Nahser, Wonda Cheng, MD  omeprazole (PRILOSEC) 40 MG capsule TAKE 1 CAPSULE BY MOUTH EVERY DAY Patient taking differently: Take 40 mg by mouth daily. TAKE 1 CAPSULE BY MOUTH EVERY DAY 09/21/20   Susy Frizzle, MD  ondansetron (ZOFRAN) 4 MG tablet Take 1 tablet (4 mg total) by mouth every 8 (eight) hours as needed for nausea. 04/25/21   [name], MD  polyethylene glycol powder (GLYCOLAX/MIRALAX) 17 GM/SCOOP powder Take 1 Container by mouth once.    [provider]  rosuvastatin (CRESTOR) 5 MG tablet TAKE 1 TABLET BY MOUTH EVERY DAY 05/16/21   Nahser, Wonda Cheng, MD  spironolactone (ALDACTONE) 25 MG tablet TAKE 1 TABLET BY MOUTH EVERY DAY Patient taking differently: Take 25 mg by mouth daily. 10/29/20   Nahser, Wonda Cheng, MD  traMADol (ULTRAM) 50 MG tablet Take 1-2 tablets (50-100 mg total) by mouth every 6 (six) hours as needed for moderate pain or severe pain. 04/25/21   [name], MD    Allergies    Gatifloxacin, Nitrofurantoin macrocrystal, Azithromycin, Levofloxacin, Sulfamethoxazole-trimethoprim, and Sulfonamide derivatives  Review of Systems   Review of Systems  Constitutional:  Negative for chills and fever.  HENT:  Negative for ear pain and sore throat.   Eyes:  Negative for pain and visual disturbance.  Respiratory:  Negative for cough and shortness of breath.   Cardiovascular:  Positive for  palpitations. Negative for chest pain.  Gastrointestinal:  Negative for abdominal pain and vomiting.  Genitourinary:  Negative for dysuria and hematuria.  Musculoskeletal:  Negative for arthralgias and back pain.  Skin:  Negative for color change and rash.  Neurological:  Negative for seizures and syncope.  All other systems reviewed and are negative.  Physical Exam Updated Vital Signs BP (!) 118/58   Pulse 65   Temp 98.1 F (36.7 C) (Oral)   Resp 19   Ht '5\' 6"'  (1.676 m)   Wt 80.3 kg   LMP  (  LMP Unknown)   SpO2 94%   BMI 28.57 kg/m   Physical Exam Vitals and nursing note reviewed.  Constitutional:      General: She is not in acute distress.    Appearance: She is well-developed. She is not ill-appearing.  HENT:     Head: Normocephalic and atraumatic.     Nose: Nose normal.     Mouth/Throat:     Mouth: Mucous membranes are moist.  Eyes:     Extraocular Movements: Extraocular movements intact.     Conjunctiva/sclera: Conjunctivae normal.     Pupils: Pupils are equal, round, and reactive to light.  Cardiovascular:     Rate and Rhythm: Tachycardia present. Rhythm irregular.     Pulses: Normal pulses.     Heart sounds: No murmur heard. Pulmonary:     Effort: Pulmonary effort is normal. No respiratory distress.     Breath sounds: Normal breath sounds.  Abdominal:     Palpations: Abdomen is soft.     Tenderness: There is no abdominal tenderness.  Musculoskeletal:     Cervical back: Neck supple.  Skin:    General: Skin is warm and dry.  Neurological:     General: No focal deficit present.     Mental Status: She is alert and oriented to person, place, and time.     Cranial Nerves: No cranial nerve deficit.     Sensory: No sensory deficit.     Motor: No weakness.     Coordination: Coordination normal.    ED Results / Procedures / Treatments   Labs (all labs ordered are listed, but only abnormal results are displayed) Labs Reviewed  BASIC METABOLIC PANEL - Abnormal;  Notable for the following components:      Result Value   Glucose, Bld 166 (*)    Creatinine, Ser 1.19 (*)    GFR, Estimated 47 (*)    All other components within normal limits  RESP PANEL BY RT-PCR (FLU A&B, COVID) ARPGX2  CBC  MAGNESIUM  TSH  T3  T4    EKG EKG Interpretation  Date/Time:  Saturday May 18 2021 10:56:40 EDT Ventricular Rate:  71 PR Interval:  206 QRS Duration: 106 QT Interval:  400 QTC Calculation: 435 R Axis:   -53 Text Interpretation: Sinus rhythm Left anterior fascicular block Abnormal R-wave progression, early transition Left ventricular hypertrophy Confirmed by Lennice Sites ([id number]) on 05/18/2021 11:04:31 AM  Radiology DG Chest Portable 1 View  Result Date: 05/18/2021 CLINICAL DATA:  Palpitations. EXAM: PORTABLE CHEST 1 VIEW COMPARISON:  02/21/2020 FINDINGS: Again noted are surgical clips in the right breast region. Both lungs are clear. Heart and mediastinum are within normal limits. Trachea is midline. Negative for a pneumothorax. No acute bone abnormality. Probable mitral annular calcifications. IMPRESSION: No active disease. Electronically Signed   By: Markus Daft M.D.   On: 05/18/2021 10:29    Procedures .Critical Care  Date/Time: 05/18/2021 12:20 PM Performed by: Lennice Sites, DO Authorized by: Lennice Sites, DO   Critical care provider statement:    Critical care time (minutes):  35   Critical care was necessary to treat or prevent imminent or life-threatening deterioration of the following conditions:  Cardiac failure (afib with RVR)   Critical care was time spent personally by me on the following activities:  Blood draw for specimens, development of treatment plan with patient or surrogate, discussions with consultants, obtaining history from patient or surrogate, ordering and performing treatments and interventions and ordering and review of laboratory studies  I assumed direction of critical care for this patient from another provider in my  specialty: no     Medications Ordered in ED Medications  apixaban (ELIQUIS) tablet 5 mg (has no administration in time range)  apixaban [facility]) Education Kit for DVT/PE patients (has no administration in time range)  sodium chloride 0.9 % bolus 1,000 mL (0 mLs Intravenous Stopped 05/18/21 1200)  diltiazem (CARDIZEM) 1 mg/mL load via infusion 15 mg (0 mg Intravenous Hold 05/18/21 1057)    ED Course  I have reviewed the triage vital signs and the nursing notes.  Pertinent labs & imaging results that were available during my care of the patient were reviewed by me and considered in my medical decision making (see chart for details).    MDM Rules/Calculators/A&P                           Alexandra Calderon is here with palpitations.  History of hypertension, PVCs.  States that her Apple Watch alerted her for tachycardia today and possibly atrial fibrillation.  EKG upon arrival here appears to be consistent with an atrial fibrillation/atrial flutter with RVR.  She states that she has had palpitations here recently but nothing sustained like today.  No history of the same.  Not on blood thinners.  Does not have any chest pain or shortness of breath.  Vital signs are otherwise unremarkable.  Patient was given a dose of IV diltiazem and heart rate quickly improved to the 60s.  Per nursing staff they state that about half of the IV bolus of diltiazem was given and then they had to pause infusion due to air bubble.  It seemed that she might of already converted to a normal sinus rhythm and so they did not give the rest of the bolus.  EKG does confirm a normal sinus rhythm at this time.  Basic labs have been ordered.  Awaiting to hear from cardiology for further recommendations.  Cardiology recommends that she stay on her home dose of atenolol.  We will start Eliquis given her CHA2DS2-VASc score of 4.  Lab work was overall unremarkable.  She will follow-up in atrial fibrillation clinic.  This chart was  dictated using voice recognition software.  Despite best efforts to proofread,  errors can occur which can change the documentation meaning.      CHA2DS2-VASc Score = 4  The patient's score is based upon: CHF History: No HTN History: Yes Diabetes History: No Stroke History: No Vascular Disease History: No Age Score: 2 Gender Score: 1     ASSESSMENT AND PLAN: Paroxysmal Atrial Fibrillation (ICD10:  I48.0) The patient's CHA2DS2-VASc score is 4, indicating a 4.8% annual risk of stroke.    Secondary Hypercoagulable State (ICD10:  D68.69) The patient is at significant risk for stroke/thromboembolism based upon her CHA2DS2-VASc Score of 4.  Start Apixaban (Eliquis).    Alexandra Milling, DO    05/18/2021 12:20 PM    Final Clinical Impression(s) / ED Diagnoses Final diagnoses:  Atrial fibrillation with RVR ([place])    Rx / DC Orders ED Discharge Orders          Ordered    Amb referral to AFIB Clinic        05/18/21 0947    apixaban (ELIQUIS) 5 MG TABS tablet  2 times daily        05/18/21 1218             Alexandra Calderon,  [place], DO 05/18/21 1221

## 2021-05-18 NOTE — Telephone Encounter (Signed)
   She woke up at 3 am to use restroom. She was having palpitations that have not stopped as they usually do.   She missed her am meds yesterday but took them last pm.  Her Smart Watch told her it was atrial fib.  What to do?  Advised her that she needed to go to the ER.  She needs rate control or rhythm control and needs blood thinners.  Advised [facility] has been very full lately, but Cardiology is only at [facility].  Can be seen by ER MD or IM at [facility].  They will go to one or the other.  Rosaria Ferries, PA-C 05/18/2021 9:04 AM

## 2021-05-19 ENCOUNTER — Other Ambulatory Visit: Payer: Self-pay | Admitting: Cardiovascular Disease

## 2021-05-20 LAB — T3: T3, Total: 130 ng/dL (ref 71–180)

## 2021-05-22 ENCOUNTER — Other Ambulatory Visit: Payer: Self-pay

## 2021-05-22 ENCOUNTER — Ambulatory Visit (HOSPITAL_COMMUNITY)
Admission: RE | Admit: 2021-05-22 | Discharge: 2021-05-22 | Disposition: A | Discharge status: Home / Self Care | Payer: Medicare HMO | Source: Ambulatory Visit | Attending: Physician Assistant | Admitting: Physician Assistant

## 2021-05-22 ENCOUNTER — Encounter (HOSPITAL_COMMUNITY): Payer: Self-pay | Admitting: Physician Assistant

## 2021-05-22 VITALS — BP 132/76 | HR 58 | Ht 66.0 in | Wt 179.0 lb

## 2021-05-22 DIAGNOSIS — I10 Essential (primary) hypertension: Secondary | ICD-10-CM | POA: Diagnosis not present

## 2021-05-22 DIAGNOSIS — I493 Ventricular premature depolarization: Secondary | ICD-10-CM | POA: Diagnosis not present

## 2021-05-22 DIAGNOSIS — I2510 Atherosclerotic heart disease of native coronary artery without angina pectoris: Secondary | ICD-10-CM | POA: Insufficient documentation

## 2021-05-22 DIAGNOSIS — Z79899 Other long term (current) drug therapy: Secondary | ICD-10-CM | POA: Insufficient documentation

## 2021-05-22 DIAGNOSIS — I4892 Unspecified atrial flutter: Secondary | ICD-10-CM | POA: Diagnosis not present

## 2021-05-22 DIAGNOSIS — I480 Paroxysmal atrial fibrillation: Secondary | ICD-10-CM | POA: Diagnosis present

## 2021-05-22 DIAGNOSIS — Z7901 Long term (current) use of anticoagulants: Secondary | ICD-10-CM | POA: Insufficient documentation

## 2021-05-22 DIAGNOSIS — R0683 Snoring: Secondary | ICD-10-CM | POA: Insufficient documentation

## 2021-05-22 DIAGNOSIS — D6869 Other thrombophilia: Secondary | ICD-10-CM | POA: Insufficient documentation

## 2021-05-22 MED ORDER — DILTIAZEM HCL 30 MG PO TABS: ORAL_TABLET | ORAL | 1 refills | Status: DC

## 2021-05-22 MED ORDER — APIXABAN 5 MG PO TABS: 5.0000 mg | ORAL_TABLET | Freq: Two times a day (BID) | ORAL | 3 refills | Status: DC

## 2021-05-22 NOTE — Progress Notes (Signed)
Primary Care Physician: Susy Frizzle, MD Primary Cardiologist: Dr Acie Fredrickson Primary Electrophysiologist: none Referring Physician: [facility]   Alexandra Calderon is a 80 y.o. female with a history of HLD, HTN, PVCs, CAD, atrial fibrillation, and atrial flutter who presents for consultation in the [facility].  The patient was initially diagnosed with atrial flutter 05/18/21 after presenting to the ED with symptoms of palpitations. ECG read as afib, appears more consistent with an atrial flutter. She was started on a diltiazem bolus and converted to SR. Patient was started on Eliquis for a CHADS2VASC score of 5. She is in SR today. She denies alcohol use but does admit to snoring, witnessed apnea, and daytime somnolence.   Today, she denies symptoms of palpitations, chest pain, shortness of breath, orthopnea, PND, lower extremity edema, dizziness, presyncope, syncope, snoring, daytime somnolence, bleeding, or neurologic sequela. The patient is tolerating medications without difficulties and is otherwise without complaint today.    Atrial Fibrillation Risk Factors:  she does have symptoms or diagnosis of sleep apnea. she does not have a history of rheumatic fever. she does not have a history of alcohol use. The patient does not have a history of early familial atrial fibrillation or other arrhythmias.  she has a BMI of Body mass index is 28.89 kg/m.Marland Kitchen Filed Weights   05/22/21 1428  Weight: 81.2 kg    Family History  Problem Relation Age of Onset   Lymphoma Father    Hodgkin's lymphoma Father    Colon cancer Mother        early 49's   Diabetes Maternal Aunt    Diabetes Paternal Grandmother    Colon polyps Neg Hx    Rectal cancer Neg Hx    Stomach cancer Neg Hx    Pancreatic cancer Neg Hx    Esophageal cancer Neg Hx      Atrial Fibrillation Management history:  Previous antiarrhythmic drugs: none Previous cardioversions: none Previous  ablations: none CHADS2VASC score: 5 Anticoagulation history: Eliquis   Past Medical History:  Diagnosis Date   Allergy    Arthritis    Breast cancer ([place])    right   Cataract    bilateral   Colon adenomas 2003,2006   Constipation    Diverticulosis    Dysrhythmia    Esophagitis    Fatty liver    GERD (gastroesophageal reflux disease)    Hearing loss    History of kidney stones    Hyperlipidemia    Hypertension    Irritable bowel syndrome 08/27/2012   Kidney stones    Osteopenia    Palpitations    Personal history of radiation therapy    Pneumonia    Polyp, stomach    PVC (premature ventricular contraction)    PVC (premature ventricular contraction)    UTI (lower urinary tract infection)    Past Surgical History:  Procedure Laterality Date   BREAST LUMPECTOMY Right 07/01/11   w/sentinal node bx - right side   COLONOSCOPY W/ BIOPSIES     DILATION AND CURETTAGE OF UTERUS     ESOPHAGOGASTRODUODENOSCOPY     kidney stones     LAPAROSCOPIC CHOLECYSTECTOMY SINGLE SITE WITH INTRAOPERATIVE CHOLANGIOGRAM N/A 04/25/2021   Procedure: LAPAROSCOPIC CHOLECYSTECTOMY SINGLE SITE WITH INTRAOPERATIVE CHOLANGIOGRAM, POSSIBLE NEEDLE CORE LIVER BX;  Surgeon: [name], MD;  Location: [facility];  Service: General;  Laterality: N/A;   shoulder arthroscopy rt 2009     SHOULDER SURGERY      Current  Outpatient Medications  Medication Sig Dispense Refill   alendronate (FOSAMAX) 70 MG tablet TAKE 1 TABLET BY MOUTH EVERY 7 DAYS WITH A FULL GLASS OF WATER ON AN EMPTY STOMACH. 12 tablet 3   apixaban (ELIQUIS) 5 MG TABS tablet Take 1 tablet (5 mg total) by mouth 2 (two) times daily. 60 tablet 0   atenolol (TENORMIN) 50 MG tablet TAKE 1 TABLET BY MOUTH TWICE A DAY 180 tablet 3   Cholecalciferol (VITAMIN D) 50 MCG (2000 UT) CAPS Take 2,000 Units by mouth daily.     losartan (COZAAR) 25 MG tablet Take 1 tablet (25 mg total) by mouth daily. 90 tablet 3   omeprazole (PRILOSEC) 40 MG capsule TAKE 1  CAPSULE BY MOUTH EVERY DAY 90 capsule 3   polyethylene glycol powder (GLYCOLAX/MIRALAX) 17 GM/SCOOP powder Take 1 Container by mouth once.     spironolactone (ALDACTONE) 25 MG tablet TAKE 1 TABLET BY MOUTH EVERY DAY 90 tablet 2   rosuvastatin (CRESTOR) 5 MG tablet TAKE 1 TABLET BY MOUTH EVERY DAY (Patient not taking: Reported on 05/22/2021) 30 tablet 0   Current Facility-Administered Medications  Medication Dose Route Frequency Provider Last Rate Last Admin   0.9 %  sodium chloride infusion  500 mL Intravenous Once Gatha Mayer, MD        Allergies  Allergen Reactions   Gatifloxacin     unknown   Nitrofurantoin Macrocrystal     unknown   Azithromycin Rash   Levofloxacin Rash    REACTION: rash    Sulfamethoxazole-Trimethoprim Rash and Other (See Comments)    Chills, fever   Sulfonamide Derivatives Rash and Other (See Comments)    Chills, fever    Social History   Socioeconomic History   Marital status: Married    Spouse name: Not on file   Number of children: 2   Years of education: Not on file   Highest education level: Not on file  Occupational History   Occupation: retired  Tobacco Use   Smoking status: Never   Smokeless tobacco: Never  Vaping Use   Vaping Use: Never used  Substance and Sexual Activity   Alcohol use: No   Drug use: No   Sexual activity: Yes  Other Topics Concern   Not on file  Social History Narrative   Spouse:  [name]   Children:   [name]- age 26- [place]   [name]- age 44- [place]   Social Determinants of Health   Financial Resource Strain: Low Risk    Difficulty of Paying Living Expenses: Not very hard  Food Insecurity: Not on file  Transportation Needs: Not on file  Physical Activity: Not on file  Stress: Not on file  Social Connections: Not on file  Intimate Partner Violence: Not on file     ROS- All systems are reviewed and negative except as per the HPI above.  Physical Exam: Vitals:   05/22/21 1428   BP: 132/76  Pulse: (!) 58  Weight: 81.2 kg  Height: '5\' 6"'$  (1.676 m)    GEN- The patient is a well appearing elderly female, alert and oriented x 3 today.   Head- normocephalic, atraumatic Eyes-  Sclera clear, conjunctiva pink Ears- hearing intact Oropharynx- clear Neck- supple  Lungs- Clear to ausculation bilaterally, normal work of breathing Heart- Regular rate and rhythm, no murmurs, rubs or gallops  GI- soft, NT, ND, + BS Extremities- no clubbing, cyanosis, or edema MS- no significant deformity or atrophy Skin- no rash  or lesion Psych- euthymic mood, full affect Neuro- strength and sensation are intact  Wt Readings from Last 3 Encounters:  05/22/21 81.2 kg  05/18/21 80.3 kg  04/26/21 82.9 kg    EKG today demonstrates  SB Vent. rate 58 BPM PR interval 184 ms QRS duration 94 ms QT/QTcB 446/437 ms  Epic records are reviewed at length today  CHA2DS2-VASc Score = 5  The patient's score is based upon: CHF History: No HTN History: Yes Diabetes History: No Stroke History: No Vascular Disease History: Yes Age Score: 2 Gender Score: 1      ASSESSMENT AND PLAN: 1. Paroxysmal atrial fibrillation/Atrial flutter The patient's CHA2DS2-VASc score is 5, indicating a 7.2% annual risk of stroke.   General education about afib provided and questions answered. We also discussed her stroke risk and the risks and benefits of anticoagulation. Apple Watch strips personally reviewed which do show true afib. We discussed therapeutic options today. Will start diltiazem 30 mg PRN q 4 hours for heart racing. Continue atenolol 50 mg daily Continue Eliquis 5 mg BID Check echocardiogram Apple watch for home monitoring.   2. Secondary Hypercoagulable State (ICD10:  D68.69) The patient is at significant risk for stroke/thromboembolism based upon her CHA2DS2-VASc Score of 5.  Continue Apixaban (Eliquis).   3. CAD Coronary calcification noted on CT On rosuvastatin  No anginal  symptoms.   4. Snoring/daytime somnolence/witnessed apnea The importance of adequate treatment of sleep apnea was discussed today in order to improve our ability to maintain sinus rhythm long term. Her husband has seen Dr Maxwell Caul. Will refer to him.   5. HTN Stable, no changes today.   Follow up in the AF clinic in one month.    [facility] [address] [place], [place] [address] [phone] 05/22/2021 2:48 PM

## 2021-05-22 NOTE — Patient Instructions (Signed)
Cardizem 30mg -- take 1 tablet every 4 hours AS NEEDED for heart rate >100 as long as top number of blood pressure >100.  

## 2021-05-24 LAB — T4: T4, Total: 11.8 ug/dL (ref 4.5–12.0)

## 2021-05-28 ENCOUNTER — Encounter: Payer: Self-pay | Admitting: Family Medicine

## 2021-06-03 ENCOUNTER — Telehealth: Payer: Self-pay | Admitting: Family Medicine

## 2021-06-03 NOTE — Telephone Encounter (Signed)
Patient requesting Rx for cough medicine she can take at night to control her cough.   Pharmacy confirmed as   [id number] [place] [id number] Phone:  [phone] Fax:  [phone] DEA #:  [id number] Please advise at [phone] [phone]

## 2021-06-03 NOTE — Telephone Encounter (Signed)
Virtual appointment scheduled for Thursday, 8/25 with provider.

## 2021-06-03 NOTE — Telephone Encounter (Signed)
Please schedule appointment - virtual with me or later this week with PCP

## 2021-06-06 ENCOUNTER — Other Ambulatory Visit: Payer: Self-pay

## 2021-06-06 ENCOUNTER — Encounter: Payer: Self-pay | Admitting: Nurse Practitioner

## 2021-06-06 ENCOUNTER — Telehealth (INDEPENDENT_AMBULATORY_CARE_PROVIDER_SITE_OTHER): Payer: Medicare HMO | Admitting: Nurse Practitioner

## 2021-06-06 DIAGNOSIS — U071 COVID-19: Secondary | ICD-10-CM | POA: Diagnosis not present

## 2021-06-06 MED ORDER — GUAIFENESIN ER 600 MG PO TB12: 600.0000 mg | ORAL_TABLET | Freq: Two times a day (BID) | ORAL | 0 refills | Status: DC | PRN

## 2021-06-06 NOTE — Progress Notes (Signed)
Subjective:    Patient ID: Alexandra Calderon, female    DOB: 10-10-1941, 80 y.o.   MRN: [id number]  HPI: Alexandra Calderon is a 80 y.o. female presenting virtually for cough.   Chief Complaint  Patient presents with   URI   UPPER RESPIRATORY TRACT INFECTION Onset: 2 weeks ago COVID-19 testing history: tested positive 8/11 COVID-19 vaccination status: has 2 shots and 1 booster Fever: yes; 99-100.4 Cough: yes, congested Shortness of breath: no Wheezing: no Chest pain: no Chest tightness: no Chest congestion: yes Nasal congestion: no Runny nose: no Post nasal drip: no Sneezing: no Sore throat: no Swollen glands: no Sinus pressure: no Headache: no Face pain: no Toothache: no Ear pain: no  Ear pressure: no  Eyes red/itching:no Eye drainage/crusting: no  Nausea: no  Vomiting: no Diarrhea: no  Change in appetite: yes; decreased  Loss of taste/smell: no  Rash: no Fatigue: no Sick contacts: yes Strep contacts: no  Context: better Recurrent sinusitis: no Treatments attempted: Tylenol Relief with OTC medications: yes  Allergies  Allergen Reactions   Gatifloxacin     unknown   Nitrofurantoin Macrocrystal     unknown   Azithromycin Rash   Levofloxacin Rash    REACTION: rash    Sulfamethoxazole-Trimethoprim Rash and Other (See Comments)    Chills, fever   Sulfonamide Derivatives Rash and Other (See Comments)    Chills, fever    Outpatient Encounter Medications as of 06/06/2021  Medication Sig   guaiFENesin (MUCINEX) 600 MG 12 hr tablet Take 1 tablet (600 mg total) by mouth 2 (two) times daily as needed for cough or to loosen phlegm.   alendronate (FOSAMAX) 70 MG tablet TAKE 1 TABLET BY MOUTH EVERY 7 DAYS WITH A FULL GLASS OF WATER ON AN EMPTY STOMACH.   apixaban (ELIQUIS) 5 MG TABS tablet Take 1 tablet (5 mg total) by mouth 2 (two) times daily.   atenolol (TENORMIN) 50 MG tablet TAKE 1 TABLET BY MOUTH TWICE A DAY   Cholecalciferol (VITAMIN D) 50 MCG (2000 UT)  CAPS Take 2,000 Units by mouth daily.   diltiazem (CARDIZEM) 30 MG tablet Take 1 tablet every 4 hours AS NEEDED for heart rate >100   losartan (COZAAR) 25 MG tablet Take 1 tablet (25 mg total) by mouth daily.   omeprazole (PRILOSEC) 40 MG capsule TAKE 1 CAPSULE BY MOUTH EVERY DAY   polyethylene glycol powder (GLYCOLAX/MIRALAX) 17 GM/SCOOP powder Take 1 Container by mouth once.   rosuvastatin (CRESTOR) 5 MG tablet TAKE 1 TABLET BY MOUTH EVERY DAY (Patient not taking: Reported on 05/22/2021)   spironolactone (ALDACTONE) 25 MG tablet TAKE 1 TABLET BY MOUTH EVERY DAY   Facility-Administered Encounter Medications as of 06/06/2021  Medication   0.9 %  sodium chloride infusion    Patient Active Problem List   Diagnosis Date Noted   Paroxysmal atrial fibrillation ([place]) 05/22/2021   Secondary hypercoagulable state ([place]) 05/22/2021   Personal history of breast cancer 09/16/2017   PVC's (premature ventricular contractions) 02/10/2017   Irritable bowel syndrome 08/27/2012   Breast cancer of upper-inner quadrant of right female breast ([place]) 08/18/2011   Malignant tumor of breast ([place]) 10/13/2010   CYSTITIS, CHRONIC 03/29/2010   PALPITATIONS 09/19/2009   GERD 11/27/2008   Osteoarthritis 08/28/2008   ROTATOR CUFF TEAR 05/18/2008   Dyslipidemia 07/01/2007   Essential hypertension 04/07/2007   Disorder of bone and cartilage 04/07/2007    Past Medical History:  Diagnosis Date   Allergy    Arthritis  Breast cancer ([name])    right   Cataract    bilateral   Colon adenomas 2003,2006   Constipation    Diverticulosis    Dysrhythmia    Esophagitis    Fatty liver    GERD (gastroesophageal reflux disease)    Hearing loss    History of kidney stones    Hyperlipidemia    Hypertension    Irritable bowel syndrome 08/27/2012   Kidney stones    Osteopenia    Palpitations    Personal history of radiation therapy    Pneumonia    Polyp, stomach    PVC (premature ventricular contraction)    PVC  (premature ventricular contraction)    UTI (lower urinary tract infection)     Relevant past medical, surgical, family and social history reviewed and updated as indicated. Interim medical history since our last visit reviewed.  Review of Systems Per HPI unless specifically indicated above     Objective:    LMP  (LMP Unknown)   Wt Readings from Last 3 Encounters:  05/22/21 179 lb (81.2 kg)  05/18/21 177 lb (80.3 kg)  04/26/21 182 lb 12.8 oz (82.9 kg)    Physical Exam Physical examination unable to be performed due to lack of equipment.    Assessment & Plan:  1. COVID-19 Acute.  Symptoms are improving and likely will continue to improve with time.  No signs of pneumonia today.  Start Mucinex to help thin secretions. Push fluids, can take Tylenol up to four times daily for fever.  Follow up if symptoms worsen, if s/s pneumonia develop, or if symptoms do not continue to improve.  - guaiFENesin (MUCINEX) 600 MG 12 hr tablet; Take 1 tablet (600 mg total) by mouth 2 (two) times daily as needed for cough or to loosen phlegm.  Dispense: 30 tablet; Refill: 0    Follow up plan: No follow-ups on file.   This visit was completed via telephone due to the restrictions of the COVID-19 pandemic. All issues as above were discussed and addressed but no physical exam was performed. If it was felt that the patient should be evaluated in the office, they were directed there. The patient verbally consented to this visit. Patient was unable to complete an audio/visual visit due to Technical difficulties.  Patient was able to join the virtual visit, however could not figure out how to unmute her microphone.  Visit was then transitioned to phone visit. Location of the patient: home Location of the provider: work Those involved with this call:  Provider: Noemi Chapel, DNP, FNP-C CMA: n/a Front Desk/Registration: Vevelyn Pat  Time spent on call:  8 minutes on the phone discussing health  concerns. 11 minutes total spent in review of patient's record and preparation of their chart. I verified patient identity using two factors (patient name and date of birth). Patient consents verbally to being seen via telemedicine visit today.

## 2021-06-12 ENCOUNTER — Other Ambulatory Visit: Payer: Self-pay

## 2021-06-12 ENCOUNTER — Ambulatory Visit (HOSPITAL_COMMUNITY)
Admission: RE | Admit: 2021-06-12 | Discharge: 2021-06-12 | Disposition: A | Discharge status: Home / Self Care | Payer: Medicare HMO | Source: Ambulatory Visit | Attending: Family Medicine | Admitting: Family Medicine

## 2021-06-12 DIAGNOSIS — I071 Rheumatic tricuspid insufficiency: Secondary | ICD-10-CM | POA: Diagnosis not present

## 2021-06-12 DIAGNOSIS — I10 Essential (primary) hypertension: Secondary | ICD-10-CM | POA: Diagnosis not present

## 2021-06-12 DIAGNOSIS — E785 Hyperlipidemia, unspecified: Secondary | ICD-10-CM | POA: Insufficient documentation

## 2021-06-12 DIAGNOSIS — I2510 Atherosclerotic heart disease of native coronary artery without angina pectoris: Secondary | ICD-10-CM | POA: Insufficient documentation

## 2021-06-12 DIAGNOSIS — I050 Rheumatic mitral stenosis: Secondary | ICD-10-CM | POA: Insufficient documentation

## 2021-06-12 DIAGNOSIS — I313 Pericardial effusion (noninflammatory): Secondary | ICD-10-CM | POA: Insufficient documentation

## 2021-06-12 DIAGNOSIS — I480 Paroxysmal atrial fibrillation: Secondary | ICD-10-CM | POA: Insufficient documentation

## 2021-06-12 DIAGNOSIS — I4892 Unspecified atrial flutter: Secondary | ICD-10-CM | POA: Insufficient documentation

## 2021-06-12 DIAGNOSIS — I358 Other nonrheumatic aortic valve disorders: Secondary | ICD-10-CM | POA: Insufficient documentation

## 2021-06-12 LAB — ECHOCARDIOGRAM COMPLETE
Area-P 1/2: 2.35 cm2
MV VTI: 1.6 cm2
S' Lateral: 2.7 cm

## 2021-06-12 NOTE — Progress Notes (Signed)
  Echocardiogram 2D Echocardiogram has been performed.  Alexandra Calderon 06/12/2021, 11:40 AM

## 2021-06-13 ENCOUNTER — Other Ambulatory Visit: Payer: Self-pay | Admitting: Family Medicine

## 2021-06-13 DIAGNOSIS — Z1231 Encounter for screening mammogram for malignant neoplasm of breast: Secondary | ICD-10-CM

## 2021-06-19 ENCOUNTER — Other Ambulatory Visit: Payer: Self-pay

## 2021-06-19 ENCOUNTER — Encounter (HOSPITAL_COMMUNITY): Payer: Self-pay | Admitting: Physician Assistant

## 2021-06-19 ENCOUNTER — Ambulatory Visit (HOSPITAL_COMMUNITY)
Admission: RE | Admit: 2021-06-19 | Discharge: 2021-06-19 | Disposition: A | Discharge status: Home / Self Care | Payer: Medicare HMO | Source: Ambulatory Visit | Attending: Physician Assistant | Admitting: Physician Assistant

## 2021-06-19 VITALS — BP 134/80 | HR 60 | Ht 66.0 in | Wt 180.6 lb

## 2021-06-19 DIAGNOSIS — D6869 Other thrombophilia: Secondary | ICD-10-CM | POA: Insufficient documentation

## 2021-06-19 DIAGNOSIS — Z79899 Other long term (current) drug therapy: Secondary | ICD-10-CM | POA: Diagnosis not present

## 2021-06-19 DIAGNOSIS — Z7901 Long term (current) use of anticoagulants: Secondary | ICD-10-CM | POA: Insufficient documentation

## 2021-06-19 DIAGNOSIS — I4892 Unspecified atrial flutter: Secondary | ICD-10-CM | POA: Diagnosis not present

## 2021-06-19 DIAGNOSIS — E785 Hyperlipidemia, unspecified: Secondary | ICD-10-CM | POA: Insufficient documentation

## 2021-06-19 DIAGNOSIS — I2510 Atherosclerotic heart disease of native coronary artery without angina pectoris: Secondary | ICD-10-CM | POA: Diagnosis not present

## 2021-06-19 DIAGNOSIS — I480 Paroxysmal atrial fibrillation: Secondary | ICD-10-CM

## 2021-06-19 DIAGNOSIS — R400 Somnolence: Secondary | ICD-10-CM | POA: Insufficient documentation

## 2021-06-19 DIAGNOSIS — I10 Essential (primary) hypertension: Secondary | ICD-10-CM | POA: Insufficient documentation

## 2021-06-19 DIAGNOSIS — R0683 Snoring: Secondary | ICD-10-CM | POA: Diagnosis not present

## 2021-06-19 NOTE — Progress Notes (Signed)
Primary Care Physician: Susy Frizzle, MD Primary Cardiologist: Dr Acie Fredrickson Primary Electrophysiologist: none Referring Physician: [facility]   Alexandra Calderon is a 80 y.o. female with a history of HLD, HTN, PVCs, CAD, atrial fibrillation, and atrial flutter who presents for follow up in the [facility].  The patient was initially diagnosed with atrial flutter 05/18/21 after presenting to the ED with symptoms of palpitations. ECG read as afib, appears more consistent with an atrial flutter. She was started on a diltiazem bolus and converted to SR. Patient was started on Eliquis for a CHADS2VASC score of 5. She denies alcohol use but does admit to snoring, witnessed apnea, and daytime somnolence.   On follow up today, patient reports that she has done well since her last visit. She has had very brief palpitations lasting 1-2 seconds, nothing sustained. She has not used her PRN CCB. She denies any bleeding issues on anticoagulation. Patient has not seen sleep medicine yet.   Today, she denies symptoms of chest pain, shortness of breath, orthopnea, PND, lower extremity edema, dizziness, presyncope, syncope, bleeding, or neurologic sequela. The patient is tolerating medications without difficulties and is otherwise without complaint today.    Atrial Fibrillation Risk Factors:  she does have symptoms or diagnosis of sleep apnea. she does not have a history of rheumatic fever. she does not have a history of alcohol use. The patient does not have a history of early familial atrial fibrillation or other arrhythmias.  she has a BMI of Body mass index is 29.15 kg/m.Marland Kitchen Filed Weights   06/19/21 1102  Weight: 81.9 kg     Family History  Problem Relation Age of Onset   Lymphoma Father    Hodgkin's lymphoma Father    Colon cancer Mother        early 24's   Diabetes Maternal Aunt    Diabetes Paternal Grandmother    Colon polyps Neg Hx    Rectal cancer Neg Hx     Stomach cancer Neg Hx    Pancreatic cancer Neg Hx    Esophageal cancer Neg Hx      Atrial Fibrillation Management history:  Previous antiarrhythmic drugs: none Previous cardioversions: none Previous ablations: none CHADS2VASC score: 5 Anticoagulation history: Eliquis   Past Medical History:  Diagnosis Date   Allergy    Arthritis    Breast cancer ([place])    right   Cataract    bilateral   Colon adenomas 2003,2006   Constipation    Diverticulosis    Dysrhythmia    Esophagitis    Fatty liver    GERD (gastroesophageal reflux disease)    Hearing loss    History of kidney stones    Hyperlipidemia    Hypertension    Irritable bowel syndrome 08/27/2012   Kidney stones    Osteopenia    Palpitations    Personal history of radiation therapy    Pneumonia    Polyp, stomach    PVC (premature ventricular contraction)    PVC (premature ventricular contraction)    UTI (lower urinary tract infection)    Past Surgical History:  Procedure Laterality Date   BREAST LUMPECTOMY Right 07/01/11   w/sentinal node bx - right side   COLONOSCOPY W/ BIOPSIES     DILATION AND CURETTAGE OF UTERUS     ESOPHAGOGASTRODUODENOSCOPY     kidney stones     LAPAROSCOPIC CHOLECYSTECTOMY SINGLE SITE WITH INTRAOPERATIVE CHOLANGIOGRAM N/A 04/25/2021   Procedure: LAPAROSCOPIC CHOLECYSTECTOMY SINGLE  SITE WITH INTRAOPERATIVE CHOLANGIOGRAM, POSSIBLE NEEDLE CORE LIVER BX;  Surgeon: [name], MD;  Location: [facility];  Service: General;  Laterality: N/A;   shoulder arthroscopy rt 2009     SHOULDER SURGERY      Current Outpatient Medications  Medication Sig Dispense Refill   apixaban (ELIQUIS) 5 MG TABS tablet Take 1 tablet (5 mg total) by mouth 2 (two) times daily. 60 tablet 3   atenolol (TENORMIN) 50 MG tablet TAKE 1 TABLET BY MOUTH TWICE A DAY 180 tablet 3   Cholecalciferol (VITAMIN D) 50 MCG (2000 UT) CAPS Take 2,000 Units by mouth daily.     diltiazem (CARDIZEM) 30 MG tablet Take 1 tablet every 4 hours  AS NEEDED for heart rate >100 30 tablet 1   guaiFENesin (MUCINEX) 600 MG 12 hr tablet Take 1 tablet (600 mg total) by mouth 2 (two) times daily as needed for cough or to loosen phlegm. 30 tablet 0   losartan (COZAAR) 25 MG tablet Take 1 tablet (25 mg total) by mouth daily. 90 tablet 3   omeprazole (PRILOSEC) 40 MG capsule TAKE 1 CAPSULE BY MOUTH EVERY DAY 90 capsule 3   rosuvastatin (CRESTOR) 5 MG tablet TAKE 1 TABLET BY MOUTH EVERY DAY 30 tablet 0   spironolactone (ALDACTONE) 25 MG tablet TAKE 1 TABLET BY MOUTH EVERY DAY 90 tablet 2   zinc gluconate 50 MG tablet Take 50 mg by mouth daily.     alendronate (FOSAMAX) 70 MG tablet TAKE 1 TABLET BY MOUTH EVERY 7 DAYS WITH A FULL GLASS OF WATER ON AN EMPTY STOMACH. (Patient not taking: Reported on 06/19/2021) 12 tablet 3   Current Facility-Administered Medications  Medication Dose Route Frequency Provider Last Rate Last Admin   0.9 %  sodium chloride infusion  500 mL Intravenous Once Gatha Mayer, MD        Allergies  Allergen Reactions   Gatifloxacin     unknown   Nitrofurantoin Macrocrystal     unknown   Azithromycin Rash   Levofloxacin Rash    REACTION: rash    Sulfamethoxazole-Trimethoprim Rash and Other (See Comments)    Chills, fever   Sulfonamide Derivatives Rash and Other (See Comments)    Chills, fever    Social History   Socioeconomic History   Marital status: Married    Spouse name: Not on file   Number of children: 2   Years of education: Not on file   Highest education level: Not on file  Occupational History   Occupation: retired  Tobacco Use   Smoking status: Never   Smokeless tobacco: Never  Vaping Use   Vaping Use: Never used  Substance and Sexual Activity   Alcohol use: No   Drug use: No   Sexual activity: Yes  Other Topics Concern   Not on file  Social History Narrative   Spouse:  [name]   Children:   [name]- age 48- [place]   [name]- age 71- [place]   Social Determinants of  Health   Financial Resource Strain: Low Risk    Difficulty of Paying Living Expenses: Not very hard  Food Insecurity: Not on file  Transportation Needs: Not on file  Physical Activity: Not on file  Stress: Not on file  Social Connections: Not on file  Intimate Partner Violence: Not on file     ROS- All systems are reviewed and negative except as per the HPI above.  Physical Exam: Vitals:   06/19/21 1102  BP: 134/80  Pulse: 60  Weight: 81.9 kg  Height: '5\' 6"'$  (1.676 m)    GEN- The patient is a well appearing elderly female, alert and oriented x 3 today.   HEENT-head normocephalic, atraumatic, sclera clear, conjunctiva pink, hearing intact, trachea midline. Lungs- Clear to ausculation bilaterally, normal work of breathing Heart- Regular rate and rhythm, no murmurs, rubs or gallops  GI- soft, NT, ND, + BS Extremities- no clubbing, cyanosis, or edema MS- no significant deformity or atrophy Skin- no rash or lesion Psych- euthymic mood, full affect Neuro- strength and sensation are intact   Wt Readings from Last 3 Encounters:  06/19/21 81.9 kg  05/22/21 81.2 kg  05/18/21 80.3 kg    EKG today demonstrates  SR Vent. rate 60 BPM PR interval 188 ms QRS duration 92 ms QT/QTcB 430/430 ms  Echo 06/12/21 demonstrated  1. Left ventricular ejection fraction, by estimation, is 60 to 65%. The left ventricle has normal function. The left ventricle has no regional wall motion abnormalities. Left ventricular diastolic parameters are indeterminate. Elevated left ventricular end-diastolic pressure.   2. Right ventricular systolic function is normal. The right ventricular  size is normal. There is moderately elevated pulmonary artery systolic pressure. The estimated right ventricular systolic pressure is [id number] mmHg.   3. The pericardial effusion is anterior to the right ventricle.   4. The mitral valve is degenerative. Trivial mitral valve regurgitation. Mild mitral stenosis. The mean  mitral valve gradient is 4.0 mmHg. Severe mitral annular calcification.   5. The aortic valve is tricuspid. Aortic valve regurgitation is not  visualized. Mild aortic valve sclerosis is present, with no evidence of aortic valve stenosis.   6. The inferior vena cava is normal in size with greater than 50%  respiratory variability, suggesting right atrial pressure of 3 mmHg.   Epic records are reviewed at length today  CHA2DS2-VASc Score = 5  The patient's score is based upon: CHF History: No HTN History: Yes Diabetes History: No Stroke History: No Vascular Disease History: Yes Age Score: 2 Gender Score: 1      ASSESSMENT AND PLAN: 1. Paroxysmal atrial fibrillation/Atrial flutter The patient's CHA2DS2-VASc score is 5, indicating a 7.2% annual risk of stroke. Patient appears to be maintaining SR. Continue diltiazem 30 mg PRN q 4 hours for heart racing. Continue atenolol 50 mg daily Continue Eliquis 5 mg BID Apple watch for home monitoring.   2. Secondary Hypercoagulable State (ICD10:  D68.69) The patient is at significant risk for stroke/thromboembolism based upon her CHA2DS2-VASc Score of 5.  Continue Apixaban (Eliquis).   3. CAD Coronary calcification noted on CT No anginal symptoms.  4. Snoring/daytime somnolence/witnessed apnea Patient referred to Dr Maxwell Caul. She will f/u to make appt.  5. HTN Stable, no changes today.   Follow up with Dr Acie Fredrickson as scheduled. AF clinic in 6 months.    [facility] [address] [place], [place] [address] [phone] 06/19/2021 11:30 AM

## 2021-06-20 ENCOUNTER — Other Ambulatory Visit: Payer: Self-pay | Admitting: Family Medicine

## 2021-06-23 ENCOUNTER — Other Ambulatory Visit: Payer: Self-pay | Admitting: Family Medicine

## 2021-06-25 ENCOUNTER — Ambulatory Visit (INDEPENDENT_AMBULATORY_CARE_PROVIDER_SITE_OTHER): Payer: Medicare HMO | Admitting: Family Medicine

## 2021-06-25 ENCOUNTER — Other Ambulatory Visit: Payer: Self-pay

## 2021-06-25 ENCOUNTER — Telehealth: Payer: Self-pay | Admitting: Pharmacist

## 2021-06-25 VITALS — BP 116/66 | HR 64 | Ht 66.0 in | Wt 179.8 lb

## 2021-06-25 DIAGNOSIS — R300 Dysuria: Secondary | ICD-10-CM

## 2021-06-25 LAB — URINALYSIS, ROUTINE W REFLEX MICROSCOPIC
Bilirubin Urine: NEGATIVE
Glucose, UA: NEGATIVE
Hgb urine dipstick: NEGATIVE
Hyaline Cast: NONE SEEN /LPF
Ketones, ur: NEGATIVE
Nitrite: NEGATIVE
Protein, ur: NEGATIVE
RBC / HPF: NONE SEEN /HPF (ref 0–2)
Specific Gravity, Urine: 1.01 (ref 1.001–1.035)
pH: 5.5 (ref 5.0–8.0)

## 2021-06-25 LAB — MICROSCOPIC MESSAGE

## 2021-06-25 MED ORDER — CIPROFLOXACIN HCL 500 MG PO TABS: 500.0000 mg | ORAL_TABLET | Freq: Two times a day (BID) | ORAL | 0 refills | Status: AC

## 2021-06-25 NOTE — Progress Notes (Addendum)
    Chronic Care Management Pharmacy Assistant   Name: Alexandra Calderon  MRN: [id number] DOB: 1941/08/12  Reason for Encounter: General Disease State Call   Conditions to be addressed/monitored: HTN, PVCs, GERD/IBS, Dyslipidemia  Recent office visits:  06/25/21 Dr. Dennard Schaumann For Dysuria. STARTED Ciprofloxacin 500 mg 2 times daily.  06/06/21 (Video Visit) Alexandra Bear, NP. For COVID-10. STARTED Guaifenesin 600 mg 2 times daily PRN.  04/26/21 Alexandra Bear, NP. For urine problems. Per note: D-mannose is the urinary supplement  Recent consult visits:  06/19/21 Cardiology [name], [name], PA For Afib. No medication changes.  05/22/21 Cardiology [name], [name], PA For Afib. STARTED Diltiazem 30 mg 1 tablet every 4 hours as needed.   Hospital visits: 06/12/21 [facility] ( 13 Hours) For echocardiogram.  05/18/21 [facility] ( 3 Hours) For Afib. STARTED Apixaban 5 mg 2 times daily. Per note: EKG, Labs were preformed.  04/25/21 [facility] Alexandra Boston, MD. (5 Hours) For surgery. Per note: Patient had LAPAROSCOPIC CHOLECYSTECTOMY SINGLE SITE WITH INTRAOPERATIVE CHOLANGIOGRAM, POSSIBLE NEEDLE CORE LIVER BX procedure. No medication changes.   Medications: Outpatient Encounter Medications as of 06/25/2021  Medication Sig   alendronate (FOSAMAX) 70 MG tablet TAKE 1 TABLET BY MOUTH EVERY 7 DAYS WITH A FULL GLASS OF WATER ON AN EMPTY STOMACH.   apixaban (ELIQUIS) 5 MG TABS tablet Take 1 tablet (5 mg total) by mouth 2 (two) times daily.   atenolol (TENORMIN) 50 MG tablet TAKE 1 TABLET BY MOUTH TWICE A DAY   Cholecalciferol (VITAMIN D) 50 MCG (2000 UT) CAPS Take 2,000 Units by mouth daily.   ciprofloxacin (CIPRO) 500 MG tablet Take 1 tablet (500 mg total) by mouth 2 (two) times daily for 10 days.   diltiazem (CARDIZEM) 30 MG tablet Take 1 tablet every 4 hours AS NEEDED for heart rate >100   guaiFENesin (MUCINEX) 600 MG 12 hr  tablet Take 1 tablet (600 mg total) by mouth 2 (two) times daily as needed for cough or to loosen phlegm.   losartan (COZAAR) 25 MG tablet Take 1 tablet (25 mg total) by mouth daily.   omeprazole (PRILOSEC) 40 MG capsule TAKE 1 CAPSULE BY MOUTH EVERY DAY   rosuvastatin (CRESTOR) 5 MG tablet TAKE 1 TABLET BY MOUTH EVERY DAY (Patient not taking: Reported on 06/25/2021)   spironolactone (ALDACTONE) 25 MG tablet TAKE 1 TABLET BY MOUTH EVERY DAY   zinc gluconate 50 MG tablet Take 50 mg by mouth daily.   Facility-Administered Encounter Medications as of 06/25/2021  Medication   0.9 %  sodium chloride infusion   GEN CALL: Patient stated she recently had COVID but she is doing very well now. Patient stated she goes walking with her husband regularly. She stated she has a good appetite and ensures that she drinks enough water. She stated she does not have any questions or concerns about her medications at this time. She did state that her Eliquis was experience but wanted to see if her coupon codes work first.   Care Gaps: Not on the list.   Star Rating Drugs: Losartan 25 mg 04/19/21 90 DS, Rosuvastatin 5 mg 05/16/21 30 DS.   Follow-Up:Pharmacist Review  Charlann Lange, [place] Pharmacist Assistant [phone]

## 2021-06-25 NOTE — Progress Notes (Signed)
Subjective:    Patient ID: Alexandra Calderon, female    DOB: [birth year], 80 y.o.   MRN: [id number]  Patient is a very pleasant 80 year old Caucasian female who presents today complaining of dysuria.  She had her gallbladder removed this summer in July.  In August she had a "bladder infection" that went away on its own.  She never took the antibiotics that I called out for her.  However she did save them.  Then at the end of August, the symptoms returned and she took Keflex 3 times daily for 7 days from August 30 through September 6.  Symptoms went away however on September 9 the symptoms of return.  For the last 4 days she has had burning dysuria with urination.  She denies any hematuria.  She denies any foul-smelling urine but she does report increased frequency and urgency.  Urinalysis today however is unremarkable except for +1 leukocyte esterase Past Medical History:  Diagnosis Date   Allergy    Arthritis    Breast cancer ([place])    right   Colon adenomas 2003,2006   Constipation    Diverticulosis    Esophagitis    GERD (gastroesophageal reflux disease)    Hearing loss    Hyperlipidemia    Hypertension    Irritable bowel syndrome 08/27/2012   Kidney stones    Osteopenia    Palpitations    Personal history of radiation therapy    Pneumonia    Polyp, stomach    PVC (premature ventricular contraction)    UTI (lower urinary tract infection)    Past Surgical History:  Procedure Laterality Date   BREAST LUMPECTOMY Right 07/01/11   w/sentinal node bx - right side   COLONOSCOPY W/ BIOPSIES     DILATION AND CURETTAGE OF UTERUS     ESOPHAGOGASTRODUODENOSCOPY     kidney stones     shoulder arthroscopy rt 2009     SHOULDER SURGERY     Current Outpatient Medications on File Prior to Visit  Medication Sig Dispense Refill   ALPRAZolam (XANAX) 0.25 MG tablet Take 1 tablet (0.25 mg total) by mouth at bedtime as needed for anxiety or sleep. 90 tablet 1   atenolol (TENORMIN) 50 MG tablet TAKE  2 TABLETS BY MOUTH EVERY DAY 180 tablet 1   cholecalciferol (VITAMIN D) 1000 units tablet Take 2,000 Units by mouth daily.     Coenzyme Q10 (CO Q 10 PO) Take 100 mg by mouth daily.     famotidine (PEPCID) 20 MG tablet TAKE 1 TABLET BY MOUTH TWICE A DAY 180 tablet 1   losartan (COZAAR) 25 MG tablet Take 25 mg by mouth daily.     omeprazole (PRILOSEC) 40 MG capsule Take 1 capsule (40 mg total) by mouth daily. 30 capsule 3   OVER THE COUNTER MEDICATION Take 1 tablet by mouth daily. [facility] stool softener daily     simvastatin (ZOCOR) 20 MG tablet TAKE 1 TABLET BY MOUTH EVERY DAY 90 tablet 1   spironolactone (ALDACTONE) 25 MG tablet Take 1 tablet (25 mg total) by mouth daily. 90 tablet 3   No current facility-administered medications on file prior to visit.    Allergies  Allergen Reactions   Gatifloxacin     unknown   Levofloxacin     REACTION: rash   Nitrofurantoin Macrocrystal     unknown   Sulfamethoxazole-Trimethoprim Rash and Other (See Comments)    Chills, fever   Sulfonamide Derivatives Rash and Other (See Comments)  Chills, fever   Socioeconomic History   Marital status: Married    Spouse name: Not on file   Number of children: 2   Years of education: Not on file   Highest education level: Not on file  Social Needs   Financial resource strain: Not on file   Food insecurity - worry: Not on file   Food insecurity - inability: Not on file   Transportation needs - medical: Not on file   Transportation needs - non-medical: Not on file  Occupational History   Occupation: retired  Tobacco Use   Smoking status: Never Smoker   Smokeless tobacco: Never Used  Substance and Sexual Activity   Alcohol use: No   Drug use: No   Sexual activity: Yes  Other Topics Concern   Not on file  Social History Narrative   Spouse:  [name]   Children:   [name]- age 32- [place]   Laurisa Farm- age 84- [place]      Review of Systems  Musculoskeletal:  Positive for back  pain.  All other systems reviewed and are negative.     Objective:    Vital signs are reviewed and are normal.  Gen-NAD, A+Ox3, Cardiovascular is regular rate and rhythm with no murmurs rubs or gallops.  Pulmonary exam is normal.  Abdomen is soft, nondistended, nontender.     Assessment & Plan:  Dysuria - Plan: Urinalysis, Routine w reflex microscopic, Urine Culture Is possible that this is a partially treated infection or perhaps a resistant infection due to her recent hospitalization.  Therefore I will send a urine culture and meanwhile I will treat her with Cipro 500 mg p.o. twice daily for 5 days.  She states that despite her listed allergy she cannot take Cipro.  If the urine culture does not show any infection however I would recommend urology consultation as she is then having dysuria for no reason and would require cystoscopy.

## 2021-06-27 LAB — URINE CULTURE
MICRO NUMBER:: 12366779
SPECIMEN QUALITY:: ADEQUATE

## 2021-07-03 DIAGNOSIS — G4753 Recurrent isolated sleep paralysis: Secondary | ICD-10-CM | POA: Diagnosis not present

## 2021-07-03 DIAGNOSIS — G4719 Other hypersomnia: Secondary | ICD-10-CM | POA: Diagnosis not present

## 2021-07-17 ENCOUNTER — Other Ambulatory Visit: Payer: Self-pay | Admitting: Cardiovascular Disease

## 2021-07-17 ENCOUNTER — Other Ambulatory Visit: Payer: Medicare HMO

## 2021-07-17 ENCOUNTER — Other Ambulatory Visit: Payer: Self-pay

## 2021-07-17 DIAGNOSIS — I10 Essential (primary) hypertension: Secondary | ICD-10-CM

## 2021-07-17 DIAGNOSIS — E785 Hyperlipidemia, unspecified: Secondary | ICD-10-CM | POA: Diagnosis not present

## 2021-07-17 DIAGNOSIS — Z136 Encounter for screening for cardiovascular disorders: Secondary | ICD-10-CM | POA: Diagnosis not present

## 2021-07-17 DIAGNOSIS — Z13220 Encounter for screening for lipoid disorders: Secondary | ICD-10-CM | POA: Diagnosis not present

## 2021-07-17 DIAGNOSIS — G4733 Obstructive sleep apnea (adult) (pediatric): Secondary | ICD-10-CM | POA: Diagnosis not present

## 2021-07-17 DIAGNOSIS — G4710 Hypersomnia, unspecified: Secondary | ICD-10-CM | POA: Diagnosis not present

## 2021-07-18 LAB — COMPLETE METABOLIC PANEL WITHOUT GFR
AG Ratio: 1.3 (calc) (ref 1.0–2.5)
ALT: 9 U/L (ref 6–29)
AST: 13 U/L (ref 10–35)
Albumin: 4 g/dL (ref 3.6–5.1)
Alkaline phosphatase (APISO): 75 U/L (ref 37–153)
BUN/Creatinine Ratio: 15 (calc) (ref 6–22)
BUN: 15 mg/dL (ref 7–25)
CO2: 29 mmol/L (ref 20–32)
Calcium: 9.5 mg/dL (ref 8.6–10.4)
Chloride: 105 mmol/L (ref 98–110)
Creat: 1.01 mg/dL — ABNORMAL HIGH (ref 0.60–1.00)
Globulin: 3.1 g/dL (ref 1.9–3.7)
Glucose, Bld: 97 mg/dL (ref 65–99)
Potassium: 4.9 mmol/L (ref 3.5–5.3)
Sodium: 141 mmol/L (ref 135–146)
Total Bilirubin: 0.7 mg/dL (ref 0.2–1.2)
Total Protein: 7.1 g/dL (ref 6.1–8.1)
eGFR: 57 mL/min/{1.73_m2} — ABNORMAL LOW

## 2021-07-18 LAB — CBC WITH DIFFERENTIAL/PLATELET
Absolute Monocytes: 428 {cells}/uL (ref 200–950)
Basophils Absolute: 18 {cells}/uL (ref 0–200)
Basophils Relative: 0.4 %
Eosinophils Absolute: 51 {cells}/uL (ref 15–500)
Eosinophils Relative: 1.1 %
HCT: 39.7 % (ref 35.0–45.0)
Hemoglobin: 13 g/dL (ref 11.7–15.5)
Lymphs Abs: 1283 {cells}/uL (ref 850–3900)
MCH: 29.6 pg (ref 27.0–33.0)
MCHC: 32.7 g/dL (ref 32.0–36.0)
MCV: 90.4 fL (ref 80.0–100.0)
MPV: 11.4 fL (ref 7.5–12.5)
Monocytes Relative: 9.3 %
Neutro Abs: 2820 {cells}/uL (ref 1500–7800)
Neutrophils Relative %: 61.3 %
Platelets: 211 10*3/uL (ref 140–400)
RBC: 4.39 Million/uL (ref 3.80–5.10)
RDW: 14.3 % (ref 11.0–15.0)
Total Lymphocyte: 27.9 %
WBC: 4.6 10*3/uL (ref 3.8–10.8)

## 2021-07-18 LAB — LIPID PANEL
Cholesterol: 221 mg/dL — ABNORMAL HIGH
HDL: 39 mg/dL — ABNORMAL LOW
LDL Cholesterol (Calc): 158 mg/dL — ABNORMAL HIGH
Non-HDL Cholesterol (Calc): 182 mg/dL — ABNORMAL HIGH
Total CHOL/HDL Ratio: 5.7 (calc) — ABNORMAL HIGH
Triglycerides: 122 mg/dL

## 2021-07-19 ENCOUNTER — Other Ambulatory Visit: Payer: Self-pay | Admitting: Cardiovascular Disease

## 2021-07-23 ENCOUNTER — Ambulatory Visit
Admission: RE | Admit: 2021-07-23 | Discharge: 2021-07-23 | Disposition: A | Discharge status: Home / Self Care | Payer: Medicare HMO | Source: Ambulatory Visit | Attending: Family Medicine | Admitting: Family Medicine

## 2021-07-23 ENCOUNTER — Other Ambulatory Visit: Payer: Self-pay

## 2021-07-23 DIAGNOSIS — Z1231 Encounter for screening mammogram for malignant neoplasm of breast: Secondary | ICD-10-CM | POA: Diagnosis not present

## 2021-07-25 ENCOUNTER — Encounter: Payer: Self-pay | Admitting: Family Medicine

## 2021-07-25 ENCOUNTER — Ambulatory Visit (INDEPENDENT_AMBULATORY_CARE_PROVIDER_SITE_OTHER): Payer: Medicare HMO | Admitting: Family Medicine

## 2021-07-25 ENCOUNTER — Other Ambulatory Visit: Payer: Self-pay

## 2021-07-25 VITALS — BP 101/68 | HR 59 | Temp 97.0°F | Resp 12 | Ht 66.0 in | Wt 178.0 lb

## 2021-07-25 DIAGNOSIS — Z0000 Encounter for general adult medical examination without abnormal findings: Secondary | ICD-10-CM

## 2021-07-25 DIAGNOSIS — C50211 Malignant neoplasm of upper-inner quadrant of right female breast: Secondary | ICD-10-CM | POA: Diagnosis not present

## 2021-07-25 DIAGNOSIS — Z23 Encounter for immunization: Secondary | ICD-10-CM

## 2021-07-25 DIAGNOSIS — I10 Essential (primary) hypertension: Secondary | ICD-10-CM

## 2021-07-25 DIAGNOSIS — I480 Paroxysmal atrial fibrillation: Secondary | ICD-10-CM | POA: Diagnosis not present

## 2021-07-25 DIAGNOSIS — E7800 Pure hypercholesterolemia, unspecified: Secondary | ICD-10-CM | POA: Diagnosis not present

## 2021-07-25 NOTE — Progress Notes (Signed)
Subjective:    Patient ID: Alexandra Calderon, female    DOB: [birth year], 80 y.o.   MRN: [id number]  HPI Patient is a very pleasant 80 year old Caucasian female here today for complete physical exam.  She had a colonoscopy earlier this year and therefore this is up-to-date.  She has a history of osteoporosis and is currently on Fosamax.  Her mammogram was performed yesterday and therefore this is still awaiting results.  Due to age she does not require Pap smear.  She received a flu shot today.  She is due for Shingrix as well as COVID booster.  Since I last saw the patient, she has had her gallbladder removed.  She also had COVID.  She was also admitted to the hospital with A. fib with rapid ventricular rate.  She was discharged home after spontaneous resolution on Eliquis.  She has been on atenolol for quite some time although they did give her diltiazem 30 mg to be used every 6 hours as needed.  She has not required the diltiazem since her hospitalization.  Overall she feels good today.  She is taking her statin because I told her to stop it when her liver was inflamed due to her gallbladder issues Immunization History  Administered Date(s) Administered   DTP 05/24/2007   Fluad Quad(high Dose 65+) 06/21/2019, 07/18/2020, 07/25/2021   Influenza Split 06/29/2012   Influenza Whole 10/14/1999, 07/29/2007, 07/28/2008, 07/05/2009, 07/26/2010   Influenza, High Dose Seasonal PF 07/25/2014, 08/02/2015, 06/25/2016, 06/16/2017   Influenza,inj,Quad PF,6+ Mos 07/05/2013, 06/21/2018   PFIZER(Purple Top)SARS-COV-2 Vaccination 10/25/2019, 11/14/2019, 08/02/2020   Pneumococcal Conjugate-13 08/15/2013   Pneumococcal Polysaccharide-23 10/13/2002, 04/19/2015   Td 10/13/2006, 05/24/2007   Zoster, Live 08/05/2016   Most recent lab work is listed below: Lab on 07/17/2021  Component Date Value Ref Range Status   WBC 07/17/2021 4.6  3.8 - 10.8 Thousand/uL Final   RBC 07/17/2021 4.39  3.80 - 5.10 Million/uL Final    Hemoglobin 07/17/2021 13.0  11.7 - 15.5 g/dL Final   HCT 07/17/2021 39.7  35.0 - 45.0 % Final   MCV 07/17/2021 90.4  80.0 - 100.0 fL Final   MCH 07/17/2021 29.6  27.0 - 33.0 pg Final   MCHC 07/17/2021 32.7  32.0 - 36.0 g/dL Final   RDW 07/17/2021 14.3  11.0 - 15.0 % Final   Platelets 07/17/2021 211  140 - 400 Thousand/uL Final   MPV 07/17/2021 11.4  7.5 - 12.5 fL Final   Neutro Abs 07/17/2021 2,820  1,500 - 7,800 cells/uL Final   Lymphs Abs 07/17/2021 1,283  850 - 3,900 cells/uL Final   Absolute Monocytes 07/17/2021 428  200 - 950 cells/uL Final   Eosinophils Absolute 07/17/2021 51  15 - 500 cells/uL Final   Basophils Absolute 07/17/2021 18  0 - 200 cells/uL Final   Neutrophils Relative % 07/17/2021 61.3  % Final   Total Lymphocyte 07/17/2021 27.9  % Final   Monocytes Relative 07/17/2021 9.3  % Final   Eosinophils Relative 07/17/2021 1.1  % Final   Basophils Relative 07/17/2021 0.4  % Final   Glucose, Bld 07/17/2021 97  65 - 99 mg/dL Final   Comment: .            Fasting reference interval .    BUN 07/17/2021 15  7 - 25 mg/dL Final   Creat 07/17/2021 1.01 (A) 0.60 - 1.00 mg/dL Final   eGFR 07/17/2021 57 (A) > OR = 60 mL/min/1.4m Final   Comment: The eGFR is  based on the CKD-EPI 2021 equation. To calculate  the new eGFR from a previous Creatinine or Cystatin C result, go to https://www.kidney.org/professionals/ kdoqi/gfr%5Fcalculator    BUN/Creatinine Ratio 07/17/2021 15  6 - 22 (calc) Final   Sodium 07/17/2021 141  135 - 146 mmol/L Final   Potassium 07/17/2021 4.9  3.5 - 5.3 mmol/L Final   Chloride 07/17/2021 105  98 - 110 mmol/L Final   CO2 07/17/2021 29  20 - 32 mmol/L Final   Calcium 07/17/2021 9.5  8.6 - 10.4 mg/dL Final   Total Protein 07/17/2021 7.1  6.1 - 8.1 g/dL Final   Albumin 07/17/2021 4.0  3.6 - 5.1 g/dL Final   Globulin 07/17/2021 3.1  1.9 - 3.7 g/dL (calc) Final   AG Ratio 07/17/2021 1.3  1.0 - 2.5 (calc) Final   Total Bilirubin 07/17/2021 0.7  0.2 - 1.2  mg/dL Final   Alkaline phosphatase (APISO) 07/17/2021 75  37 - 153 U/L Final   AST 07/17/2021 13  10 - 35 U/L Final   ALT 07/17/2021 9  6 - 29 U/L Final   Cholesterol 07/17/2021 221 (A) <200 mg/dL Final   HDL 07/17/2021 39 (A) > OR = 50 mg/dL Final   Triglycerides 07/17/2021 122  <150 mg/dL Final   LDL Cholesterol (Calc) 07/17/2021 158 (A) mg/dL (calc) Final   Comment: Reference range: <100 . Desirable range <100 mg/dL for primary prevention;   <70 mg/dL for patients with CHD or diabetic patients  with > or = 2 CHD risk factors. Marland Kitchen LDL-C is now calculated using the Martin-Hopkins  calculation, which is a validated novel method providing  better accuracy than the Friedewald equation in the  estimation of LDL-C.  Cresenciano Genre et al. Annamaria Helling. 0488;891(69): 2061-2068  (http://education.[employer].com/faq/FAQ164)    Total CHOL/HDL Ratio 07/17/2021 5.7 (A) <5.0 (calc) Final   Non-HDL Cholesterol (Calc) 07/17/2021 182 (A) <130 mg/dL (calc) Final   Comment: For patients with diabetes plus 1 major ASCVD risk  factor, treating to a non-HDL-C goal of <100 mg/dL  (LDL-C of <70 mg/dL) is considered a therapeutic  option.     Past Medical History:  Diagnosis Date   Allergy    Arthritis    Breast cancer ([place])    right   Cataract    bilateral   Colon adenomas 2003,2006   Constipation    Diverticulosis    Dysrhythmia    Esophagitis    Fatty liver    GERD (gastroesophageal reflux disease)    Hearing loss    History of kidney stones    Hyperlipidemia    Hypertension    Irritable bowel syndrome 08/27/2012   Kidney stones    Osteopenia    Palpitations    Personal history of radiation therapy    Pneumonia    Polyp, stomach    PVC (premature ventricular contraction)    PVC (premature ventricular contraction)    UTI (lower urinary tract infection)    Past Surgical History:  Procedure Laterality Date   BREAST LUMPECTOMY Right 07/01/11   w/sentinal node bx - right side    COLONOSCOPY W/ BIOPSIES     DILATION AND CURETTAGE OF UTERUS     ESOPHAGOGASTRODUODENOSCOPY     kidney stones     LAPAROSCOPIC CHOLECYSTECTOMY SINGLE SITE WITH INTRAOPERATIVE CHOLANGIOGRAM N/A 04/25/2021   Procedure: LAPAROSCOPIC CHOLECYSTECTOMY SINGLE SITE WITH INTRAOPERATIVE CHOLANGIOGRAM, POSSIBLE NEEDLE CORE LIVER BX;  Surgeon: [name], MD;  Location: [facility];  Service: General;  Laterality: N/A;   shoulder arthroscopy  rt 2009     SHOULDER SURGERY      Current Outpatient Medications on File Prior to Visit  Medication Sig Dispense Refill   alendronate (FOSAMAX) 70 MG tablet TAKE 1 TABLET BY MOUTH EVERY 7 DAYS WITH A FULL GLASS OF WATER ON AN EMPTY STOMACH. (Patient not taking: Reported on 07/25/2021) 12 tablet 3   apixaban (ELIQUIS) 5 MG TABS tablet Take 1 tablet (5 mg total) by mouth 2 (two) times daily. 60 tablet 3   atenolol (TENORMIN) 50 MG tablet TAKE 1 TABLET BY MOUTH TWICE A DAY 180 tablet 3   Cholecalciferol (VITAMIN D) 50 MCG (2000 UT) CAPS Take 2,000 Units by mouth daily.     diltiazem (CARDIZEM) 30 MG tablet Take 1 tablet every 4 hours AS NEEDED for heart rate >100 30 tablet 1   losartan (COZAAR) 25 MG tablet Take 1 tablet (25 mg total) by mouth daily. Please keep upcoming appt in November 2022 with Dr. Acie Fredrickson before anymore refills. Thank you 90 tablet 0   omeprazole (PRILOSEC) 40 MG capsule TAKE 1 CAPSULE BY MOUTH EVERY DAY 90 capsule 3   rosuvastatin (CRESTOR) 5 MG tablet TAKE 1 TABLET BY MOUTH EVERY DAY (Patient not taking: Reported on 06/25/2021) 30 tablet 0   spironolactone (ALDACTONE) 25 MG tablet Take 1 tablet (25 mg total) by mouth daily. Please keep upcoming appt in November 2022 with Dr. Acie Fredrickson before anymore refills. Thank you 90 tablet 0   zinc gluconate 50 MG tablet Take 50 mg by mouth daily.     Current Facility-Administered Medications on File Prior to Visit  Medication Dose Route Frequency Provider Last Rate Last Admin   0.9 %  sodium chloride infusion  500  mL Intravenous Once Gatha Mayer, MD       Allergies  Allergen Reactions   Gatifloxacin     unknown   Nitrofurantoin Macrocrystal     unknown   Azithromycin Rash   Levofloxacin Rash    REACTION: rash    Sulfamethoxazole-Trimethoprim Rash and Other (See Comments)    Chills, fever   Sulfonamide Derivatives Rash and Other (See Comments)    Chills, fever   Social History   Socioeconomic History   Marital status: Married    Spouse name: Not on file   Number of children: 2   Years of education: Not on file   Highest education level: Not on file  Occupational History   Occupation: retired  Tobacco Use   Smoking status: Never   Smokeless tobacco: Never  Vaping Use   Vaping Use: Never used  Substance and Sexual Activity   Alcohol use: No   Drug use: No   Sexual activity: Yes  Other Topics Concern   Not on file  Social History Narrative   Spouse:  [name]   Children:   [name]- age 2- [place]   [name]- age 37- [place]   Social Determinants of Health   Financial Resource Strain: Low Risk    Difficulty of Paying Living Expenses: Not very hard  Food Insecurity: Not on file  Transportation Needs: Not on file  Physical Activity: Not on file  Stress: Not on file  Social Connections: Not on file  Intimate Partner Violence: Not on file   Family History  Problem Relation Age of Onset   Lymphoma Father    Hodgkin's lymphoma Father    Colon cancer Mother        early 55's   Diabetes Maternal Aunt  Diabetes Paternal Grandmother    Colon polyps Neg Hx    Rectal cancer Neg Hx    Stomach cancer Neg Hx    Pancreatic cancer Neg Hx    Esophageal cancer Neg Hx      Review of Systems  All other systems reviewed and are negative.     Objective:   Physical Exam Vitals reviewed.  Constitutional:      General: She is not in acute distress.    Appearance: She is well-developed. She is not diaphoretic.  HENT:     Head: Normocephalic and atraumatic.      Right Ear: External ear normal.     Left Ear: External ear normal.     Nose: Nose normal.  Eyes:     General: No scleral icterus.       Right eye: No discharge.        Left eye: No discharge.     Conjunctiva/sclera: Conjunctivae normal.     Pupils: Pupils are equal, round, and reactive to light.  Neck:     Thyroid: No thyromegaly.     Vascular: No JVD.     Trachea: No tracheal deviation.  Cardiovascular:     Rate and Rhythm: Normal rate and regular rhythm.     Heart sounds: Normal heart sounds. No murmur heard.   No friction rub. No gallop.  Pulmonary:     Effort: Pulmonary effort is normal. No respiratory distress.     Breath sounds: Normal breath sounds. No stridor. No wheezing or rales.  Chest:     Chest wall: No tenderness.  Abdominal:     General: Bowel sounds are normal. There is no distension.     Palpations: Abdomen is soft. There is no mass.     Tenderness: There is no abdominal tenderness. There is no guarding or rebound.  Musculoskeletal:        General: No tenderness or deformity. Normal range of motion.     Cervical back: Normal range of motion and neck supple.  Lymphadenopathy:     Cervical: No cervical adenopathy.  Skin:    General: Skin is warm.     Coloration: Skin is not pale.     Findings: No erythema or rash.  Neurological:     Mental Status: She is alert and oriented to person, place, and time.     Cranial Nerves: No cranial nerve deficit.     Motor: No abnormal muscle tone.     Coordination: Coordination normal.     Deep Tendon Reflexes: Reflexes are normal and symmetric.  Psychiatric:        Behavior: Behavior normal.        Thought Content: Thought content normal.        Judgment: Judgment normal.          Assessment & Plan:  Need for immunization against influenza - Plan: Flu Vaccine QUAD High Dose(Fluad)  Medicare annual wellness visit, subsequent  Malignant neoplasm of upper-inner quadrant of right female breast, unspecified  estrogen receptor status ([place])  Paroxysmal atrial fibrillation ([place])  Essential hypertension  Pure hypercholesterolemia Patient received her flu shot today.  I recommended Shingrix and also a COVID booster.  She is appropriately anticoagulated with Eliquis.  We discussed indications for when to use the diltiazem.  I want her to resume her statin.  Her liver function test have now normalized after her gallbladder has been treated.  Her cholesterol is elevated so she needs to resume her statin.  I  encouraged her to be compliant with her Fosamax calcium and vitamin D.  Her mammogram, colonoscopy are both up-to-date.  She does not require Pap smear due to age.  Denies any falls or depression or memory loss

## 2021-08-08 ENCOUNTER — Other Ambulatory Visit: Payer: Self-pay | Admitting: Cardiovascular Disease

## 2021-08-19 ENCOUNTER — Other Ambulatory Visit: Payer: Self-pay

## 2021-08-19 ENCOUNTER — Ambulatory Visit: Payer: Medicare HMO | Admitting: Cardiovascular Disease

## 2021-08-19 ENCOUNTER — Encounter: Payer: Self-pay | Admitting: Cardiovascular Disease

## 2021-08-19 VITALS — BP 108/62 | HR 65 | Ht 66.0 in | Wt 177.8 lb

## 2021-08-19 DIAGNOSIS — I272 Pulmonary hypertension, unspecified: Secondary | ICD-10-CM | POA: Insufficient documentation

## 2021-08-19 DIAGNOSIS — I480 Paroxysmal atrial fibrillation: Secondary | ICD-10-CM | POA: Diagnosis not present

## 2021-08-19 NOTE — Progress Notes (Signed)
Cardiology Office Note   Date:  08/19/2021   ID:  Alexandra Calderon, Alexandra Calderon 26-May-1941, MRN [id number]  PCP:  Susy Frizzle, MD  Cardiologist:   Mertie Moores, MD   Chief Complaint  Patient presents with   Hypertension   Problem List 1. Essential HTN 2. Palpitations - PVCs  3. Hyperlipidemia  4. Irritable bowel syndrome     Previous notes.   Alexandra Calderon is a 80 y.o. female who is being seen today for the evaluation of palpitations  at the request of Susy Frizzle, MD.  Seen with her husband   [name]  Has had palpitations all of her life.    Over the years, they have been stable for years.  Dr. Raliegh Ip increased her atenolol which seemed to have helped.   Has rare , isolated "thumps"  And then normal HR for a while These episodes may go on for several days Seem to be worse after eating , in the evening .  Do not limit her activity .  Not associated with weakness or dizziness, no dyspnea.    Does not get any regular exercise - tries to walk some   Aug. 20, 2018  Ameia is here for follow up of her HTN and PVCs  Heart is now "quiet." Exercising some ,  No CP or dyspnea.   Aug. 27, 2019:  Feeling better.   Not as many PVCs now  Takes atenolol 50 mg BID  Discussed weight loss   Sept. 14, 2020  Alexandra Calderon is seen for further evaluation of her palpitations No further palpitations. Walks on occasion.   Gets DOE walking up a hill.  Likely due to deconditioning .   Has several questions today .  Was seen by her primary for palpitations.   Dr. Dennard Schaumann gave her Diltiazem 30 mg to take as needed.   She is still on atenolol 50 mg BID     June 26, 2020: Alexandra Calderon is seen today for follow-up visit.  She has a history of palpitations.  She also has a history of hypertension.  Walks on occasion  No CP , Has occasional palpitations  BP is normal at home  Hx of hyperlipidemia  Not fast foods for several months ,  Wt today is 192 lbs  ( down 4 lbs from last year )  She had  an abdominal CT scan which incidentally found some coronary artery calcifications.  August 19, 2021: Alexandra Calderon is seen today for a new Dx of Atrial fib and for  for follow-up of her coronary artery calcifications.  She has a history of palpitations.  Dx with atrial Fib / Atrial flutter on Aug. 6 .  Was having palpitations Apple watch said she had atrial fib   She converted back to NSR after IV Diltiazem  CHADS2VASC is 5 .   Had some liver enz elevations.   Dr. Dennard Schaumann had her stop her statin   Has had a sleep study.   Does not have OAS .  Has Diltiazem 30 mg QID to take PRN atrial fib   Echo shows normal LV systolic function  Moderate pulmonary HTN  50 mmHg .  Is on aldactone  Has lost 15 lbs   Past Medical History:  Diagnosis Date   Allergy    Arthritis    Breast cancer [facility])    right   Cataract    bilateral   Colon adenomas 2003,2006   Constipation    Diverticulosis  Dysrhythmia    Esophagitis    Fatty liver    GERD (gastroesophageal reflux disease)    Hearing loss    History of kidney stones    Hyperlipidemia    Hypertension    Irritable bowel syndrome 08/27/2012   Kidney stones    Osteopenia    Palpitations    Personal history of radiation therapy    Pneumonia    Polyp, stomach    PVC (premature ventricular contraction)    PVC (premature ventricular contraction)    UTI (lower urinary tract infection)     Past Surgical History:  Procedure Laterality Date   BREAST LUMPECTOMY Right 07/01/11   w/sentinal node bx - right side   COLONOSCOPY W/ BIOPSIES     DILATION AND CURETTAGE OF UTERUS     ESOPHAGOGASTRODUODENOSCOPY     kidney stones     LAPAROSCOPIC CHOLECYSTECTOMY SINGLE SITE WITH INTRAOPERATIVE CHOLANGIOGRAM N/A 04/25/2021   Procedure: LAPAROSCOPIC CHOLECYSTECTOMY SINGLE SITE WITH INTRAOPERATIVE CHOLANGIOGRAM, POSSIBLE NEEDLE CORE LIVER BX;  Surgeon: [name], MD;  Location: [facility];  Service: General;  Laterality: N/A;   shoulder arthroscopy rt 2009      SHOULDER SURGERY       Current Outpatient Medications  Medication Sig Dispense Refill   alendronate (FOSAMAX) 70 MG tablet TAKE 1 TABLET BY MOUTH EVERY 7 DAYS WITH A FULL GLASS OF WATER ON AN EMPTY STOMACH. 12 tablet 3   apixaban (ELIQUIS) 5 MG TABS tablet Take 1 tablet (5 mg total) by mouth 2 (two) times daily. 60 tablet 3   atenolol (TENORMIN) 50 MG tablet TAKE 1 TABLET BY MOUTH TWICE A DAY 180 tablet 3   Cholecalciferol (VITAMIN D) 50 MCG (2000 UT) CAPS Take 2,000 Units by mouth daily.     diltiazem (CARDIZEM) 30 MG tablet Take 1 tablet every 4 hours AS NEEDED for heart rate >100 30 tablet 1   losartan (COZAAR) 25 MG tablet Take 1 tablet (25 mg total) by mouth daily. Please keep upcoming appt in November 2022 with Dr. Acie Fredrickson before anymore refills. Thank you 90 tablet 0   omeprazole (PRILOSEC) 40 MG capsule TAKE 1 CAPSULE BY MOUTH EVERY DAY 90 capsule 3   rosuvastatin (CRESTOR) 5 MG tablet TAKE 1 TABLET BY MOUTH EVERY DAY 30 tablet 0   spironolactone (ALDACTONE) 25 MG tablet Take 1 tablet (25 mg total) by mouth daily. Please keep upcoming appt in November 2022 with Dr. Acie Fredrickson before anymore refills. Thank you 90 tablet 0   zinc gluconate 50 MG tablet Take 50 mg by mouth daily. (Patient not taking: Reported on 08/19/2021)     Current Facility-Administered Medications  Medication Dose Route Frequency Provider Last Rate Last Admin   0.9 %  sodium chloride infusion  500 mL Intravenous Once Gatha Mayer, MD        PAD Screen 02/10/2017  Previous PAD dx? No  Previous surgical procedure? No  Pain with walking? No  Feet/toe relief with dangling? No  Painful, non-healing ulcers? No  Extremities discolored? No      Allergies:   Gatifloxacin, Nitrofurantoin macrocrystal, Azithromycin, Levofloxacin, Sulfamethoxazole-trimethoprim, and Sulfonamide derivatives    Social History:  The patient  reports that she has never smoked. She has never used smokeless tobacco. She reports that she  does not drink alcohol and does not use drugs.   Family History:  The patient's family history includes Colon cancer in her mother; Diabetes in her maternal aunt and paternal grandmother; Hodgkin's lymphoma in her father; Lymphoma  in her father.    ROS:     Noted in current hx , otherwise negative    Physical Exam: Blood pressure 108/62, pulse 65, height 5\' 6"  (1.676 m), weight 177 lb 12.8 oz (80.6 kg), SpO2 93 %.  GEN:  Well nourished, well developed in no acute distress HEENT: Normal NECK: No JVD; No carotid bruits LYMPHATICS: No lymphadenopathy CARDIAC: RRR  ,  RESPIRATORY:  Clear to auscultation without rales, wheezing or rhonchi  ABDOMEN: Soft, non-tender, non-distended MUSCULOSKELETAL:  No edema; No deformity  SKIN: Warm and dry NEUROLOGIC:  Alert and oriented x 3     EKG:       Recent Labs: 05/18/2021: Magnesium 2.0; TSH 2.201 07/17/2021: ALT 9; BUN 15; Creat 1.01; Hemoglobin 13.0; Platelets 211; Potassium 4.9; Sodium 141    Lipid Panel    Component Value Date/Time   CHOL 221 (H) 07/17/2021 0824   TRIG 122 07/17/2021 0824   HDL 39 (L) 07/17/2021 0824   CHOLHDL 5.7 (H) 07/17/2021 0824   VLDL 24.2 04/29/2017 0800   LDLCALC 158 (H) 07/17/2021 0824      Wt Readings from Last 3 Encounters:  08/19/21 177 lb 12.8 oz (80.6 kg)  07/25/21 178 lb (80.7 kg)  06/25/21 179 lb 12.8 oz (81.6 kg)      Other studies Reviewed: Additional studies/ records that were reviewed today include: . Review of the above records demonstrates:    ASSESSMENT AND PLAN:  1.  Palpitations -       2. Hypertension:   BP is well controlled.   3.  Hyperlipidemia:    4.  Coronary artery calcifications:  . Cont statin   5.  Pulmonary hypertension:  PA pressure estimated at 50 She is on Aldactone 25 mg a day.  She still eats some extra salt.  We will have her reduce her salt intake.  She will continue to work on diet, exercise, weight loss program.   Current medicines are reviewed at  length with the patient today.  The patient does not have concerns regarding medicines.  Labs/ tests ordered today include:  No orders of the defined types were placed in this encounter.    Disposition:       Mertie Moores, MD  08/19/2021 3:51 PM    [facility] [address], [place], [place]  [address] Phone: [phone]; Fax: [phone]

## 2021-08-19 NOTE — Patient Instructions (Signed)
Medication Instructions:  Your physician recommends that you continue on your current medications as directed. Please refer to the Current Medication list given to you today.  *If you need a refill on your cardiac medications before your next appointment, please call your pharmacy*   Lab Work: NONE If you have labs (blood work) drawn today and your tests are completely normal, you will receive your results only by: [place] (if you have MyChart) OR A paper copy in the mail If you have any lab test that is abnormal or we need to change your treatment, we will call you to review the results.   Testing/Procedures: NONE   Follow-Up: At [facility], you and your health needs are our priority.  As part of our continuing mission to provide you with exceptional heart care, we have created designated Provider Care Teams.  These Care Teams include your primary Cardiologist (physician) and Advanced Practice Providers (APPs -  Physician Assistants and Nurse Practitioners) who all work together to provide you with the care you need, when you need it.  We recommend signing up for the patient portal called "MyChart".  Sign up information is provided on this After Visit Summary.  MyChart is used to connect with patients for Virtual Visits (Telemedicine).  Patients are able to view lab/test results, encounter notes, upcoming appointments, etc.  Non-urgent messages can be sent to your provider as well.   To learn more about what you can do with MyChart, go to [email or web address].    Your next appointment:   6 month(s)  The format for your next appointment:   In Person  Provider:   Robbie Lis, PA-C or Richardson Dopp, PA-C    :1}

## 2021-09-16 ENCOUNTER — Encounter: Payer: Self-pay | Admitting: Family Medicine

## 2021-09-16 ENCOUNTER — Ambulatory Visit (INDEPENDENT_AMBULATORY_CARE_PROVIDER_SITE_OTHER): Payer: Medicare HMO | Admitting: Family Medicine

## 2021-09-16 VITALS — BP 122/58 | HR 63 | Temp 97.2°F | Resp 18 | Ht 66.0 in | Wt 175.0 lb

## 2021-09-16 DIAGNOSIS — R300 Dysuria: Secondary | ICD-10-CM

## 2021-09-16 LAB — URINALYSIS, ROUTINE W REFLEX MICROSCOPIC
Bacteria, UA: NONE SEEN /HPF
Bilirubin Urine: NEGATIVE
Glucose, UA: NEGATIVE
Hyaline Cast: NONE SEEN /LPF
Ketones, ur: NEGATIVE
Leukocytes,Ua: NEGATIVE
Nitrite: NEGATIVE
Protein, ur: NEGATIVE
Specific Gravity, Urine: 1.02 (ref 1.001–1.035)
WBC, UA: NONE SEEN /HPF (ref 0–5)
pH: 5.5 (ref 5.0–8.0)

## 2021-09-16 LAB — MICROSCOPIC MESSAGE

## 2021-09-16 MED ORDER — CIPROFLOXACIN HCL 250 MG PO TABS: 250.0000 mg | ORAL_TABLET | Freq: Two times a day (BID) | ORAL | 0 refills | Status: AC

## 2021-09-16 NOTE — Progress Notes (Signed)
Subjective:    Patient ID: Alexandra Calderon, female    DOB: [birth year], 80 y.o.   MRN: [id number]  Patient is a very pleasant 80 year old Caucasian female who presents today complaining of dysuria.  Patient states that symptoms began Monday or Tuesday of last week.  Symptoms include dysuria and frequency.  She denies any hematuria or foul smell.  She denies any fevers or chills.  She denies any CVA tenderness.  She denies any abdominal pain or pelvic pain or nausea or vomiting.  She denies any change in her bowel movements.  She denies any vaginal discharge.  Did take Keflex Sunday due to the severity of her symptoms.  Urinalysis today shows trace blood but is otherwise negative.  Previous urine cultures have confirmed E. coli urinary tract infection when the patient was having symptoms. Past Medical History:  Diagnosis Date   Allergy    Arthritis    Breast cancer ([place])    right   Colon adenomas 2003,2006   Constipation    Diverticulosis    Esophagitis    GERD (gastroesophageal reflux disease)    Hearing loss    Hyperlipidemia    Hypertension    Irritable bowel syndrome 08/27/2012   Kidney stones    Osteopenia    Palpitations    Personal history of radiation therapy    Pneumonia    Polyp, stomach    PVC (premature ventricular contraction)    UTI (lower urinary tract infection)    Past Surgical History:  Procedure Laterality Date   BREAST LUMPECTOMY Right 07/01/11   w/sentinal node bx - right side   COLONOSCOPY W/ BIOPSIES     DILATION AND CURETTAGE OF UTERUS     ESOPHAGOGASTRODUODENOSCOPY     kidney stones     shoulder arthroscopy rt 2009     SHOULDER SURGERY     Current Outpatient Medications on File Prior to Visit  Medication Sig Dispense Refill   ALPRAZolam (XANAX) 0.25 MG tablet Take 1 tablet (0.25 mg total) by mouth at bedtime as needed for anxiety or sleep. 90 tablet 1   atenolol (TENORMIN) 50 MG tablet TAKE 2 TABLETS BY MOUTH EVERY DAY 180 tablet 1   cholecalciferol  (VITAMIN D) 1000 units tablet Take 2,000 Units by mouth daily.     Coenzyme Q10 (CO Q 10 PO) Take 100 mg by mouth daily.     famotidine (PEPCID) 20 MG tablet TAKE 1 TABLET BY MOUTH TWICE A DAY 180 tablet 1   losartan (COZAAR) 25 MG tablet Take 25 mg by mouth daily.     omeprazole (PRILOSEC) 40 MG capsule Take 1 capsule (40 mg total) by mouth daily. 30 capsule 3   OVER THE COUNTER MEDICATION Take 1 tablet by mouth daily. [facility] stool softener daily     simvastatin (ZOCOR) 20 MG tablet TAKE 1 TABLET BY MOUTH EVERY DAY 90 tablet 1   spironolactone (ALDACTONE) 25 MG tablet Take 1 tablet (25 mg total) by mouth daily. 90 tablet 3   No current facility-administered medications on file prior to visit.    Allergies  Allergen Reactions   Gatifloxacin     unknown   Levofloxacin     REACTION: rash   Nitrofurantoin Macrocrystal     unknown   Sulfamethoxazole-Trimethoprim Rash and Other (See Comments)    Chills, fever   Sulfonamide Derivatives Rash and Other (See Comments)    Chills, fever   Socioeconomic History   Marital status: Married    Spouse  name: Not on file   Number of children: 2   Years of education: Not on file   Highest education level: Not on file  Social Needs   Financial resource strain: Not on file   Food insecurity - worry: Not on file   Food insecurity - inability: Not on file   Transportation needs - medical: Not on file   Transportation needs - non-medical: Not on file  Occupational History   Occupation: retired  Tobacco Use   Smoking status: Never Smoker   Smokeless tobacco: Never Used  Substance and Sexual Activity   Alcohol use: No   Drug use: No   Sexual activity: Yes  Other Topics Concern   Not on file  Social History Narrative   Spouse:  [name]   Children:   [name]- age 23- [place]   Messina Kosinski- age 59- [place]      Review of Systems  Musculoskeletal:  Positive for back pain.  All other systems reviewed and are negative.      Objective:    Vital signs are reviewed and are normal.  Gen-NAD, A+Ox3, Cardiovascular is regular rate and rhythm with no murmurs rubs or gallops.  Pulmonary exam is normal.  Abdomen is soft, nondistended, nontender.     Assessment & Plan:  Dysuria - Plan: Urinalysis, Routine w reflex microscopic Urinalysis today is unremarkable however the patient is having symptoms.  I believe the urinalysis may be obscured by the fact she is taking Keflex for 24 hours.  I will send a urine culture and treat her with Cipro 250 mg twice daily for 5 days

## 2021-09-17 LAB — URINE CULTURE
MICRO NUMBER:: 12714219
Result:: NO GROWTH
SPECIMEN QUALITY:: ADEQUATE

## 2021-09-19 ENCOUNTER — Telehealth: Payer: Self-pay

## 2021-10-18 ENCOUNTER — Other Ambulatory Visit: Payer: Self-pay | Admitting: Cardiovascular Disease

## 2021-10-21 ENCOUNTER — Other Ambulatory Visit (HOSPITAL_COMMUNITY): Payer: Self-pay | Admitting: Physician Assistant

## 2021-10-31 ENCOUNTER — Telehealth: Payer: Self-pay | Admitting: Pharmacist

## 2021-10-31 NOTE — Progress Notes (Signed)
Chronic Care Management Pharmacy Assistant   Name: FIORELA PELZER  MRN: [id number] DOB: 01-06-1941   Reason for Encounter: Disease State - General Adherence Call    Recent office visits:  09/16/21 Jenna Luo MD - Family Medicine - Dysuria - Labs were ordered. ciprofloxacin (CIPRO) 250 MG tablet Take 1 tablet (250 mg total) by mouth 2 (two) times daily for 3 days prescribed. Follow up as needed.   07/25/21 Jenna Luo, MD - Family Medicine - Need for immunization - Flu vaccine administered. No medication changes. Follow up as scheduled.   Recent consult visits:  08/19/21 Grayland Jack, MD - Cardiology - Hypertension - No medication changes. Follow up in 6 months.  07/17/21 Nehemiah Settle - OSA - No notes available  07/03/21 Nehemiah Settle - OSA - No notes available.     Hospital visits:  Medication Reconciliation was completed by comparing discharge summary, patients EMR and Pharmacy list, and upon discussion with patient.  Admitted to the hospital on 05/18/21 due to A fib. Discharge date was 05/20/21. Discharged from [facility]?Medications Started at [facility] Discharge:?? apixaban (ELIQUIS) 5 MG TABS Take 1 tablet (5 mg total) by mouth 2 (two) times daily  Medication Changes at Hospital Discharge: None noted.   Medications Discontinued at Hospital Discharge: None noted.   Medications that remain the same after Hospital Discharge:??  All other medications will remain the same.    Medications: Outpatient Encounter Medications as of 10/31/2021  Medication Sig   alendronate (FOSAMAX) 70 MG tablet TAKE 1 TABLET BY MOUTH EVERY 7 DAYS WITH A FULL GLASS OF WATER ON AN EMPTY STOMACH.   atenolol (TENORMIN) 50 MG tablet TAKE 1 TABLET BY MOUTH TWICE A DAY   Cholecalciferol (VITAMIN D) 50 MCG (2000 UT) CAPS Take 2,000 Units by mouth daily.   diltiazem (CARDIZEM) 30 MG tablet Take 1 tablet every 4 hours AS NEEDED for heart rate >100   ELIQUIS 5 MG TABS tablet TAKE 1  TABLET BY MOUTH TWICE A DAY   losartan (COZAAR) 25 MG tablet TAKE 1 TABLET (25 MG TOTAL) BY MOUTH DAILY. PLEASE KEEP UPCOMING APPT IN NOVEMBER 2022 WITH DR. Acie Fredrickson BEFORE ANYMORE REFILLS. THANK YOU   omeprazole (PRILOSEC) 40 MG capsule TAKE 1 CAPSULE BY MOUTH EVERY DAY   rosuvastatin (CRESTOR) 5 MG tablet TAKE 1 TABLET BY MOUTH EVERY DAY   spironolactone (ALDACTONE) 25 MG tablet Take 1 tablet (25 mg total) by mouth daily. Please keep upcoming appt in November 2022 with Dr. Acie Fredrickson before anymore refills. Thank you   Facility-Administered Encounter Medications as of 10/31/2021  Medication   0.9 %  sodium chloride infusion    Have you had any problems recently with your health? Patient denied any problems recently with her health.   Have you had any problems with your pharmacy? Patient denied any problems with her pharmacy.  What issues or side effects are you having with your medications? Patient denied any side effects or issues with her current medications.   What would you like me to pass along to Leata Mouse, CPP for them to help you with?  Patient did not have anything to pass along to CPP at this time.   What can we do to take care of you better? Patient had not recommendations at this time. Patient is satisfied with current level of care.   Care Gaps  AWV: last done 07/23/20 Colonoscopy: done 12/14/20 DM Eye Exam: N/A DM Foot Exam:N/A Microalbumin:N/A HbgAIC:  N/A DEXA: 03/01/18 Mammogram: 07/23/21   Star Rating Drugs: Losartan 25 mg - last filled 10/18/21 90 days  Rosuvastatin 5 mg - last filled 10/21/21 30 days   Future Appointments  Date Time Provider Elizabeth  11/14/2021  3:45 PM [facility] PHARMACIST [facility] None    Jobe Gibbon, [facility] Clinical Pharmacist Assistant  [phone]

## 2021-11-10 ENCOUNTER — Other Ambulatory Visit: Payer: Self-pay | Admitting: Cardiovascular Disease

## 2021-11-13 ENCOUNTER — Other Ambulatory Visit: Payer: Self-pay

## 2021-11-13 MED ORDER — OMEPRAZOLE 40 MG PO CPDR: DELAYED_RELEASE_CAPSULE | ORAL | 3 refills | Status: DC

## 2021-11-13 NOTE — Progress Notes (Signed)
Chronic Care Management Pharmacy Note  11/15/2021 Name:  Alexandra Calderon MRN:  [id number] DOB:  05/27/1941  Summary: General medication review and CCM visit with PharmD.  Patient coming in today for liver testing.  Recommendations/Changes made from today's visit: No changes made at today's visit  Plan: LFTs - resume Crestor if these return normal  Subjective: Alexandra Calderon is an 81 y.o. year old female who is a primary patient of Pickard, Cammie Mcgee, MD.  The CCM team was consulted for assistance with disease management and care coordination needs.    Engaged with patient by telephone for follow up visit in response to provider referral for pharmacy case management and/or care coordination services.   Consent to Services:  The patient was given the following information about Chronic Care Management services today, agreed to services, and gave verbal consent: 1. CCM service includes personalized support from designated clinical staff supervised by the primary care provider, including individualized plan of care and coordination with other care providers 2. 24/7 contact phone numbers for assistance for urgent and routine care needs. 3. Service will only be billed when office clinical staff spend 20 minutes or more in a month to coordinate care. 4. Only one practitioner may furnish and bill the service in a calendar month. 5.The patient may stop CCM services at any time (effective at the end of the month) by phone call to the office staff. 6. The patient will be responsible for cost sharing (co-pay) of up to 20% of the service fee (after annual deductible is met). Patient agreed to services and consent obtained.  Patient Care Team: Susy Frizzle, MD as PCP - General (Family Medicine) Nahser, Wonda Cheng, MD as PCP - Cardiology (Cardiology) [name], [facility] as Pharmacist (Pharmacist) [name], MD as Consulting Physician (General Surgery) Gatha Mayer, MD as Consulting Physician  (Gastroenterology)  Recent office visits: 03/05/21 Dennard Schaumann) - scheduled for R upper quadrant [place] fo confirm diagnosis of gallstones.  Confirmed diagnosis of multiple stones.  Patient was also instructed to hold Crestor.  03/04/21 Edsel Petrin) - given Keflex ABX to take for trace glucose in urine and bacteria. To follow up with PCP for gallbladder pain.  Recent consult visits: None recent  Hospital visits: None in previous 6 months   Objective:  Lab Results  Component Value Date   CREATININE 1.01 (H) 07/17/2021   BUN 15 07/17/2021   GFR 63.38 04/29/2017   GFRNONAA 47 (L) 05/18/2021   GFRAA 66 03/14/2021   NA 141 07/17/2021   K 4.9 07/17/2021   CALCIUM 9.5 07/17/2021   CO2 29 07/17/2021   GLUCOSE 97 07/17/2021    Lab Results  Component Value Date/Time   HGBA1C 6.0 12/13/2012 11:04 AM   GFR 63.38 04/29/2017 08:00 AM   GFR 71.58 04/23/2016 08:05 AM    Last diabetic Eye exam: No results found for: HMDIABEYEEXA  Last diabetic Foot exam: No results found for: HMDIABFOOTEX   Lab Results  Component Value Date   CHOL 221 (H) 07/17/2021   HDL 39 (L) 07/17/2021   LDLCALC 158 (H) 07/17/2021   TRIG 122 07/17/2021   CHOLHDL 5.7 (H) 07/17/2021    Hepatic Function Latest Ref Rng & Units 07/17/2021 03/14/2021 03/05/2021  Total Protein 6.1 - 8.1 g/dL 7.1 7.0 7.0  Albumin 3.5 - 5.2 g/dL - - -  AST 10 - 35 U/L 13 18 215(H)  ALT 6 - 29 U/L 9 44(H) 393(H)  Alk Phosphatase 39 - 117 U/L - - -  Total Bilirubin 0.2 - 1.2 mg/dL 0.7 0.7 1.0  Bilirubin, Direct 0.0 - 0.3 mg/dL - - -    Lab Results  Component Value Date/Time   TSH 2.201 05/18/2021 10:30 AM   TSH 4.49 04/29/2017 08:00 AM   TSH 2.280 02/10/2017 10:10 AM   FREET4 1.0 09/19/2009 12:00 AM    CBC Latest Ref Rng & Units 07/17/2021 05/18/2021 04/18/2021  WBC 3.8 - 10.8 Thousand/uL 4.6 5.2 4.5  Hemoglobin 11.7 - 15.5 g/dL 13.0 14.0 14.0  Hematocrit 35.0 - 45.0 % 39.7 44.0 42.8  Platelets 140 - 400 Thousand/uL 211 317 212    Lab  Results  Component Value Date/Time   VD25OH 62 10/19/2013 09:04 AM   VD25OH 70 10/07/2012 08:59 AM    Clinical ASCVD: No  The 10-year ASCVD risk score (Arnett DK, et al., 2019) is: 24.9%   Values used to calculate the score:     Age: 52 years     Sex: Female     Is Non-Hispanic African American: No     Diabetic: No     Tobacco smoker: No     Systolic Blood Pressure: 854 mmHg     Is BP treated: Yes     HDL Cholesterol: 39 mg/dL     Total Cholesterol: 221 mg/dL    Depression screen [facility] 2/9 07/25/2021 07/23/2020 07/21/2019  Decreased Interest - 0 0  Down, Depressed, Hopeless 0 0 0  PHQ - 2 Score 0 0 0  Some recent data might be hidden     Social History   Tobacco Use  Smoking Status Never  Smokeless Tobacco Never   BP Readings from Last 3 Encounters:  09/16/21 (!) 122/58  08/19/21 108/62  07/25/21 101/68   Pulse Readings from Last 3 Encounters:  09/16/21 63  08/19/21 65  07/25/21 (!) 59   Wt Readings from Last 3 Encounters:  09/16/21 175 lb (79.4 kg)  08/19/21 177 lb 12.8 oz (80.6 kg)  07/25/21 178 lb (80.7 kg)   BMI Readings from Last 3 Encounters:  09/16/21 28.25 kg/m  08/19/21 28.70 kg/m  07/25/21 28.73 kg/m    Assessment/Interventions: Review of patient past medical history, allergies, medications, health status, including review of consultants reports, laboratory and other test data, was performed as part of comprehensive evaluation and provision of chronic care management services.   SDOH:  (Social Determinants of Health) assessments and interventions performed: Yes   Financial Resource Strain: Low Risk    Difficulty of Paying Living Expenses: Not very hard    SDOH Screenings   Alcohol Screen: Not on file  Depression (PHQ2-9): Low Risk    PHQ-2 Score: 0  Financial Resource Strain: Low Risk    Difficulty of Paying Living Expenses: Not very hard  Food Insecurity: Not on file  Housing: Not on file  Physical Activity: Not on file  Social  Connections: Not on file  Stress: Not on file  Tobacco Use: Low Risk    Smoking Tobacco Use: Never   Smokeless Tobacco Use: Never   Passive Exposure: Not on file  Transportation Needs: Not on file    CCM Care Plan  Allergies  Allergen Reactions   Gatifloxacin     unknown   Nitrofurantoin Macrocrystal     unknown   Azithromycin Rash   Levofloxacin Rash    REACTION: rash    Sulfamethoxazole-Trimethoprim Rash and Other (See Comments)    Chills, fever   Sulfonamide Derivatives Rash and Other (See Comments)    Chills,  fever    Medications Reviewed Today     Reviewed by [name], [facility] (Pharmacist) on 11/15/21 at 1056  Med List Status: <None>   Medication Order Taking? Sig Documenting Provider Last Dose Status Informant  0.9 %  sodium chloride infusion [id number]   Gatha Mayer, MD  Active   alendronate (FOSAMAX) 70 MG tablet [id number] Yes TAKE 1 TABLET BY MOUTH EVERY 7 DAYS WITH A FULL GLASS OF WATER ON AN EMPTY STOMACH. Susy Frizzle, MD Taking Active   atenolol (TENORMIN) 50 MG tablet [id number] Yes TAKE 1 TABLET BY MOUTH TWICE A DAY Susy Frizzle, MD Taking Active   Cholecalciferol (VITAMIN D) 50 MCG (2000 UT) CAPS [id number] Yes Take 2,000 Units by mouth daily. [provider] Taking Active Self  diltiazem (CARDIZEM) 30 MG tablet [id number] Yes Take 1 tablet every 4 hours AS NEEDED for heart rate >100 [name], [name], PA Taking Active   ELIQUIS 5 MG TABS tablet [id number] Yes TAKE 1 TABLET BY MOUTH TWICE A DAY [name], [name], PA Taking Active   losartan (COZAAR) 25 MG tablet [id number] Yes TAKE 1 TABLET (25 MG TOTAL) BY MOUTH DAILY. PLEASE KEEP UPCOMING APPT IN NOVEMBER 2022 WITH DR. Acie Fredrickson BEFORE ANYMORE REFILLS. THANK YOU Nahser, Wonda Cheng, MD Taking Active   omeprazole (PRILOSEC) 40 MG capsule [id number] Yes TAKE 1 CAPSULE BY MOUTH EVERY DAY Susy Frizzle, MD Taking Active   rosuvastatin (CRESTOR) 5 MG tablet [id number] Yes TAKE 1 TABLET BY MOUTH  EVERY DAY Nahser, Wonda Cheng, MD Taking Active   spironolactone (ALDACTONE) 25 MG tablet [id number] Yes TAKE 1 TAB BY MOUTH DAILY. PLEASE KEEP UPCOMING APPT IN NOVEMBER 2022 WITH DR. Acie Fredrickson BEFORE REFILLS Nahser, Wonda Cheng, MD Taking Active             Patient Active Problem List   Diagnosis Date Noted   Pulmonary HTN ([place]) 08/19/2021   Paroxysmal atrial fibrillation ([place]) 05/22/2021   Secondary hypercoagulable state ([place]) 05/22/2021   Personal history of breast cancer 09/16/2017   PVC's (premature ventricular contractions) 02/10/2017   Irritable bowel syndrome 08/27/2012   Breast cancer of upper-inner quadrant of right female breast ([place]) 08/18/2011   Neoplasm of breast 10/13/2010   CYSTITIS, CHRONIC 03/29/2010   Palpitations 09/19/2009   GERD 11/27/2008   Osteoarthritis 08/28/2008   ROTATOR CUFF TEAR 05/18/2008   Dyslipidemia 07/01/2007   Essential hypertension 04/07/2007   Disorder of bone and cartilage 04/07/2007    Immunization History  Administered Date(s) Administered   DTP 05/24/2007   Fluad Quad(high Dose 65+) 06/21/2019, 07/18/2020, 07/25/2021   Influenza Split 06/29/2012   Influenza Whole 10/14/1999, 07/29/2007, 07/28/2008, 07/05/2009, 07/26/2010   Influenza, High Dose Seasonal PF 07/25/2014, 08/02/2015, 06/25/2016, 06/16/2017   Influenza,inj,Quad PF,6+ Mos 07/05/2013, 06/21/2018   PFIZER(Purple Top)SARS-COV-2 Vaccination 10/25/2019, 11/14/2019, 08/02/2020   Pneumococcal Conjugate-13 08/15/2013   Pneumococcal Polysaccharide-23 10/13/2002, 04/19/2015   Td 10/13/2006, 05/24/2007   Zoster, Live 08/05/2016    Conditions to be addressed/monitored:  HTN, PVCs, GERD/IBS, Dyslipidemia  Care Plan : General Pharmacy (Adult)  Updates made by [name], RPH since 11/15/2021 12:00 AM     Problem: HTN, PVCs, GERD/IBS, Dyslipidemia   Priority: High  Onset Date: 03/14/2021     Long-Range Goal: Patient-Specific Goal   Start Date: 03/15/2021  Expected End Date:  09/14/2021  Recent Progress: On track  Priority: High  Note:   Current Barriers:  Elevated LDL  Pharmacist Clinical Goal(s):  Patient will maintain control of cholesterol  as evidenced by lipid panel  adhere to prescribed medication regimen as evidenced by pill box usage contact provider office for questions/concerns as evidenced notation of same in electronic health record through collaboration with PharmD and provider.   Interventions: 1:1 collaboration with Susy Frizzle, MD regarding development and update of comprehensive plan of care as evidenced by provider attestation and co-signature Inter-disciplinary care team collaboration (see longitudinal plan of care) Comprehensive medication review performed; medication list updated in electronic medical record  Hypertension/PVCs (BP goal <140/90) -Controlled -Current treatment: Losartan 34m daily Appropriate, Effective, Safe, Accessible Atenolol 537mdaily  Appropriate, Effective, Safe, Accessible Spironolactone 2568maily  Appropriate, Effective, Safe, Accessible -Medications previously tried: amlodipine, irbesartan/HCTZ, telmisartan/HCTZ  -Current home readings: :"normal" -Current exercise habits: walks daily for about 10-15 minutes -Denies hypotensive/hypertensive symptoms -Educated on BP goals and benefits of medications for prevention of heart attack, stroke and kidney damage; Exercise goal of 150 minutes per week; Importance of home blood pressure monitoring; Symptoms of hypotension and importance of maintaining adequate hydration; -Counseled to monitor BP at home periodically, document, and provide log at future appointments -Recommended to continue current medication  Update 11/14/21 No logs for BP readings BP controlled in office Denies any dizziness or HA Continue current medication at this time, adherence 100%  Hyperlipidemia: (LDL goal < 100) -Controlled -Current treatment: Rosuvastatin 5mg7mily Appropriate,  Query effective, -Medications previously tried: none noted  -Current exercise habits: see above -Educated on Cholesterol goals;  Benefits of statin for ASCVD risk reduction; Importance of limiting foods high in cholesterol; Strategies to manage statin-induced myalgias;- she is already taking CoQ10 -She is getting repeat liver test -Recommended to continue current medication Recommended resume Crestor if liver tests return to normal  Update 11/14/21 LDL now uncontrolled as patient was off her statin for a while due to acute liver disease. Since most recent LDL of 158, she has started back on Crestor 5mg 67mly.  Tolerating medication well since restart. Recommend recheck lipids in April, if remains elevated would consider dose increase. ASCVD risk 24.9% - high risk Continue current meds until next labs.  GERD/IBS (Goal: Control symptoms) -Controlled -Current treatment  Omeprazole 40mg 50my -Medications previously tried: none noted  -Denies symptoms and takes medication appropriately -Working on trigger foods -Recommended to continue current medication   Patient Goals/Self-Care Activities Patient will:  - take medications as prescribed check blood pressure periodically, document, and provide at future appointments target a minimum of 150 minutes of moderate intensity exercise weekly  Follow Up Plan: The care management team will reach out to the patient again over the next 180 days.            Medication Assistance: None required.  Patient affirms current coverage meets needs.  Compliance/Adherence/Medication fill history: Care Gaps: Zoster Vaccine    Patient's preferred pharmacy is:  [id number] [place] [id number] [phone] [facility] [address] [place] [phone] box? Yes Pt endorses 100% compliance  We discussed: Benefits of medication synchronization, packaging and delivery as well as enhanced pharmacist oversight with [employer]. Patient decided to: Continue current medication management strategy  Care Plan and Follow Up Patient Decision:  Patient agrees to Care Plan and Follow-up.  Plan: The care management team will reach out to the patient again over the next 180 days.  Beverly Milch, PharmD Clinical Pharmacist [place] [phone]

## 2021-11-14 ENCOUNTER — Ambulatory Visit (INDEPENDENT_AMBULATORY_CARE_PROVIDER_SITE_OTHER): Payer: Medicare HMO | Admitting: Pharmacist

## 2021-11-14 DIAGNOSIS — I10 Essential (primary) hypertension: Secondary | ICD-10-CM

## 2021-11-14 DIAGNOSIS — E7800 Pure hypercholesterolemia, unspecified: Secondary | ICD-10-CM

## 2021-11-15 NOTE — Patient Instructions (Addendum)
Visit Information   Goals Addressed             This Visit's Progress    Track and Manage My Blood Pressure-Hypertension   On track    Timeframe:  Long-Range Goal Priority:  High Start Date:  03/15/21                           Expected End Date: 09/14/21                      Follow Up Date 06/15/21   - check blood pressure weekly - choose a place to take my blood pressure (home, clinic or office, retail store) - write blood pressure results in a log or diary    Why is this important?   You won't feel high blood pressure, but it can still hurt your blood vessels.  High blood pressure can cause heart or kidney problems. It can also cause a stroke.  Making lifestyle changes like losing a little weight or eating less salt will help.  Checking your blood pressure at home and at different times of the day can help to control blood pressure.  If the doctor prescribes medicine remember to take it the way the doctor ordered.  Call the office if you cannot afford the medicine or if there are questions about it.     Notes:        Patient Care Plan: General Pharmacy (Adult)     Problem Identified: HTN, PVCs, GERD/IBS, Dyslipidemia   Priority: High  Onset Date: 03/14/2021     Long-Range Goal: Patient-Specific Goal   Start Date: 03/15/2021  Expected End Date: 09/14/2021  Recent Progress: On track  Priority: High  Note:   Current Barriers:  Elevated LDL  Pharmacist Clinical Goal(s):  Patient will maintain control of cholesterol as evidenced by lipid panel  adhere to prescribed medication regimen as evidenced by pill box usage contact provider office for questions/concerns as evidenced notation of same in electronic health record through collaboration with PharmD and provider.   Interventions: 1:1 collaboration with Susy Frizzle, MD regarding development and update of comprehensive plan of care as evidenced by provider attestation and co-signature Inter-disciplinary care  team collaboration (see longitudinal plan of care) Comprehensive medication review performed; medication list updated in electronic medical record  Hypertension/PVCs (BP goal <140/90) -Controlled -Current treatment: Losartan 25mg  daily Appropriate, Effective, Safe, Accessible Atenolol 50mg  daily  Appropriate, Effective, Safe, Accessible Spironolactone 25mg  daily  Appropriate, Effective, Safe, Accessible -Medications previously tried: amlodipine, irbesartan/HCTZ, telmisartan/HCTZ  -Current home readings: :"normal" -Current exercise habits: walks daily for about 10-15 minutes -Denies hypotensive/hypertensive symptoms -Educated on BP goals and benefits of medications for prevention of heart attack, stroke and kidney damage; Exercise goal of 150 minutes per week; Importance of home blood pressure monitoring; Symptoms of hypotension and importance of maintaining adequate hydration; -Counseled to monitor BP at home periodically, document, and provide log at future appointments -Recommended to continue current medication  Update 11/14/21 No logs for BP readings BP controlled in office Denies any dizziness or HA Continue current medication at this time, adherence 100%  Hyperlipidemia: (LDL goal < 100) -Controlled -Current treatment: Rosuvastatin 5mg  daily Appropriate, Query effective, -Medications previously tried: none noted  -Current exercise habits: see above -Educated on Cholesterol goals;  Benefits of statin for ASCVD risk reduction; Importance of limiting foods high in cholesterol; Strategies to manage statin-induced myalgias;- she is already taking CoQ10 -She  is getting repeat liver test -Recommended to continue current medication Recommended resume Crestor if liver tests return to normal  Update 11/14/21 LDL now uncontrolled as patient was off her statin for a while due to acute liver disease. Since most recent LDL of 158, she has started back on Crestor 5mg  daily.   Tolerating medication well since restart. Recommend recheck lipids in April, if remains elevated would consider dose increase. ASCVD risk 24.9% - high risk Continue current meds until next labs.  GERD/IBS (Goal: Control symptoms) -Controlled -Current treatment  Omeprazole 40mg  daily -Medications previously tried: none noted  -Denies symptoms and takes medication appropriately -Working on trigger foods -Recommended to continue current medication   Patient Goals/Self-Care Activities Patient will:  - take medications as prescribed check blood pressure periodically, document, and provide at future appointments target a minimum of 150 minutes of moderate intensity exercise weekly  Follow Up Plan: The care management team will reach out to the patient again over the next 180 days.           Patient verbalizes understanding of instructions and care plan provided today and agrees to view in [place]. Active MyChart status confirmed with patient.   Telephone follow up appointment with pharmacy team member scheduled for: 6 months  [name], [place], PharmD, [place] Clinical Pharmacist Practitioner [place] [phone]

## 2021-11-25 ENCOUNTER — Other Ambulatory Visit: Payer: Self-pay | Admitting: Cardiovascular Disease

## 2021-12-10 DIAGNOSIS — I10 Essential (primary) hypertension: Secondary | ICD-10-CM

## 2021-12-10 DIAGNOSIS — E7800 Pure hypercholesterolemia, unspecified: Secondary | ICD-10-CM | POA: Diagnosis not present

## 2021-12-25 ENCOUNTER — Ambulatory Visit (HOSPITAL_COMMUNITY)
Admission: RE | Admit: 2021-12-25 | Discharge: 2021-12-25 | Disposition: A | Discharge status: Home / Self Care | Payer: Medicare HMO | Source: Ambulatory Visit | Attending: Physician Assistant | Admitting: Physician Assistant

## 2021-12-25 ENCOUNTER — Other Ambulatory Visit: Payer: Self-pay

## 2021-12-25 ENCOUNTER — Encounter (HOSPITAL_COMMUNITY): Payer: Self-pay | Admitting: Physician Assistant

## 2021-12-25 VITALS — BP 132/76 | HR 60 | Ht 66.0 in | Wt 178.8 lb

## 2021-12-25 DIAGNOSIS — Z7901 Long term (current) use of anticoagulants: Secondary | ICD-10-CM | POA: Insufficient documentation

## 2021-12-25 DIAGNOSIS — I4510 Unspecified right bundle-branch block: Secondary | ICD-10-CM | POA: Insufficient documentation

## 2021-12-25 DIAGNOSIS — Z79899 Other long term (current) drug therapy: Secondary | ICD-10-CM | POA: Insufficient documentation

## 2021-12-25 DIAGNOSIS — I4892 Unspecified atrial flutter: Secondary | ICD-10-CM | POA: Insufficient documentation

## 2021-12-25 DIAGNOSIS — I2510 Atherosclerotic heart disease of native coronary artery without angina pectoris: Secondary | ICD-10-CM | POA: Diagnosis not present

## 2021-12-25 DIAGNOSIS — D6869 Other thrombophilia: Secondary | ICD-10-CM | POA: Diagnosis not present

## 2021-12-25 DIAGNOSIS — I493 Ventricular premature depolarization: Secondary | ICD-10-CM | POA: Diagnosis not present

## 2021-12-25 DIAGNOSIS — E785 Hyperlipidemia, unspecified: Secondary | ICD-10-CM | POA: Insufficient documentation

## 2021-12-25 DIAGNOSIS — I480 Paroxysmal atrial fibrillation: Secondary | ICD-10-CM | POA: Insufficient documentation

## 2021-12-25 DIAGNOSIS — R9431 Abnormal electrocardiogram [ECG] [EKG]: Secondary | ICD-10-CM | POA: Diagnosis not present

## 2021-12-25 DIAGNOSIS — I10 Essential (primary) hypertension: Secondary | ICD-10-CM | POA: Diagnosis not present

## 2021-12-25 NOTE — Progress Notes (Signed)
? ? ?Primary Care Physician: Susy Frizzle, MD ?Primary Cardiologist: Dr Acie Fredrickson ?Primary Electrophysiologist: none ?Referring Physician: [facility] ? ? ?Alexandra Calderon is a 81 y.o. female with a history of HLD, HTN, PVCs, CAD, atrial fibrillation, and atrial flutter who presents for follow up in the [facility].  The patient was initially diagnosed with atrial flutter 05/18/21 after presenting to the ED with symptoms of palpitations. ECG read as afib, appears more consistent with an atrial flutter. She was started on a diltiazem bolus and converted to SR. Patient was started on Eliquis for a CHADS2VASC score of 5. She denies alcohol use. ? ?On follow up today, patient reports that she has done well since her last visit. She did have one brief episode of palpitations lasting only a few seconds. She has not had to use any PRN CCB. No bleeding issues on anticoagulation.  ? ?Today, she denies symptoms of chest pain, shortness of breath, orthopnea, PND, lower extremity edema, dizziness, presyncope, syncope, bleeding, or neurologic sequela. The patient is tolerating medications without difficulties and is otherwise without complaint today.  ? ? ?Atrial Fibrillation Risk Factors: ? ?she does not have symptoms or diagnosis of sleep apnea. ?Negative sleep study. ?she does not have a history of rheumatic fever. ?she does not have a history of alcohol use. ?The patient does not have a history of early familial atrial fibrillation or other arrhythmias. ? ?she has a BMI of Body mass index is 28.86 kg/m?Marland KitchenMarland Kitchen ?Filed Weights  ? 12/25/21 1058  ?Weight: 81.1 kg  ? ? ? ? ?Family History  ?Problem Relation Age of Onset  ? Lymphoma Father   ? Hodgkin's lymphoma Father   ? Colon cancer Mother   ?     early 51's  ? Diabetes Maternal Aunt   ? Diabetes Paternal Grandmother   ? Colon polyps Neg Hx   ? Rectal cancer Neg Hx   ? Stomach cancer Neg Hx   ? Pancreatic cancer Neg Hx   ? Esophageal cancer Neg Hx    ? ? ? ?Atrial Fibrillation Management history: ? ?Previous antiarrhythmic drugs: none ?Previous cardioversions: none ?Previous ablations: none ?CHADS2VASC score: 5 ?Anticoagulation history: Eliquis ? ? ?Past Medical History:  ?Diagnosis Date  ? Allergy   ? Arthritis   ? Breast cancer ([place])   ? right  ? Cataract   ? bilateral  ? Colon adenomas 2003,2006  ? Constipation   ? Diverticulosis   ? Dysrhythmia   ? Esophagitis   ? Fatty liver   ? GERD (gastroesophageal reflux disease)   ? Hearing loss   ? History of kidney stones   ? Hyperlipidemia   ? Hypertension   ? Irritable bowel syndrome 08/27/2012  ? Kidney stones   ? Osteopenia   ? Palpitations   ? Personal history of radiation therapy   ? Pneumonia   ? Polyp, stomach   ? PVC (premature ventricular contraction)   ? PVC (premature ventricular contraction)   ? UTI (lower urinary tract infection)   ? ?Past Surgical History:  ?Procedure Laterality Date  ? BREAST LUMPECTOMY Right 07/01/11  ? w/sentinal node bx - right side  ? COLONOSCOPY W/ BIOPSIES    ? DILATION AND CURETTAGE OF UTERUS    ? ESOPHAGOGASTRODUODENOSCOPY    ? kidney stones    ? LAPAROSCOPIC CHOLECYSTECTOMY SINGLE SITE WITH INTRAOPERATIVE CHOLANGIOGRAM N/A 04/25/2021  ? Procedure: LAPAROSCOPIC CHOLECYSTECTOMY SINGLE SITE WITH INTRAOPERATIVE CHOLANGIOGRAM, POSSIBLE NEEDLE CORE LIVER BX;  Surgeon:  [name], MD;  Location: [facility];  Service: General;  Laterality: N/A;  ? shoulder arthroscopy rt 2009    ? SHOULDER SURGERY    ? ? ?Current Outpatient Medications  ?Medication Sig Dispense Refill  ? alendronate (FOSAMAX) 70 MG tablet TAKE 1 TABLET BY MOUTH EVERY 7 DAYS WITH A FULL GLASS OF WATER ON AN EMPTY STOMACH. 12 tablet 3  ? atenolol (TENORMIN) 50 MG tablet TAKE 1 TABLET BY MOUTH TWICE A DAY 180 tablet 3  ? Cholecalciferol (VITAMIN D) 50 MCG (2000 UT) CAPS Take 2,000 Units by mouth daily.    ? diltiazem (CARDIZEM) 30 MG tablet Take 1 tablet every 4 hours AS NEEDED for heart rate >100 30 tablet 1  ? ELIQUIS 5  MG TABS tablet TAKE 1 TABLET BY MOUTH TWICE A DAY 60 tablet 3  ? losartan (COZAAR) 25 MG tablet TAKE 1 TABLET (25 MG TOTAL) BY MOUTH DAILY. PLEASE KEEP UPCOMING APPT IN NOVEMBER 2022 WITH DR. Acie Fredrickson BEFORE ANYMORE REFILLS. THANK YOU 90 tablet 3  ? omeprazole (PRILOSEC) 40 MG capsule TAKE 1 CAPSULE BY MOUTH EVERY DAY 90 capsule 3  ? rosuvastatin (CRESTOR) 5 MG tablet TAKE 1 TABLET BY MOUTH EVERY DAY 30 tablet 2  ? spironolactone (ALDACTONE) 25 MG tablet TAKE 1 TAB BY MOUTH DAILY. PLEASE KEEP UPCOMING APPT IN NOVEMBER 2022 WITH DR. Acie Fredrickson BEFORE REFILLS 90 tablet 2  ? ?Current Facility-Administered Medications  ?Medication Dose Route Frequency Provider Last Rate Last Admin  ? 0.9 %  sodium chloride infusion  500 mL Intravenous Once Gatha Mayer, MD      ? ? ?Allergies  ?Allergen Reactions  ? Gatifloxacin   ?  unknown  ? Nitrofurantoin Macrocrystal   ?  unknown  ? Azithromycin Rash  ? Levofloxacin Rash  ?  REACTION: rash ?  ? Sulfamethoxazole-Trimethoprim Rash and Other (See Comments)  ?  Chills, fever  ? Sulfonamide Derivatives Rash and Other (See Comments)  ?  Chills, fever  ? ? ?Social History  ? ?Socioeconomic History  ? Marital status: Married  ?  Spouse name: Not on file  ? Number of children: 2  ? Years of education: Not on file  ? Highest education level: Not on file  ?Occupational History  ? Occupation: retired  ?Tobacco Use  ? Smoking status: Never  ? Smokeless tobacco: Never  ? Tobacco comments:  ?  Never smoke 12/25/21  ?Vaping Use  ? Vaping Use: Never used  ?Substance and Sexual Activity  ? Alcohol use: No  ? Drug use: No  ? Sexual activity: Yes  ?Other Topics Concern  ? Not on file  ?Social History Narrative  ? Spouse:  [name]  ? Children:  ? Alexandra Calderon- age 46- [place]  ? Alexandra Calderon- age 80-   ? ?Social Determinants of Health  ? ?Financial Resource Strain: Low Risk   ? Difficulty of Paying Living Expenses: Not very hard  ?Food Insecurity: Not on file  ?Transportation Needs: Not on  file  ?Physical Activity: Not on file  ?Stress: Not on file  ?Social Connections: Not on file  ?Intimate Partner Violence: Not on file  ? ? ? ?ROS- All systems are reviewed and negative except as per the HPI above. ? ?Physical Exam: ?Vitals:  ? 12/25/21 1058  ?BP: 132/76  ?Pulse: 60  ?Weight: 81.1 kg  ?Height: '5\' 6"'$  (1.676 m)  ? ? ?GEN- The patient is a well appearing elderly female, alert and oriented x 3 today.   ?  HEENT-head normocephalic, atraumatic, sclera clear, conjunctiva pink, hearing intact, trachea midline. ?Lungs- Clear to ausculation bilaterally, normal work of breathing ?Heart- Regular rate and rhythm, no murmurs, rubs or gallops  ?GI- soft, NT, ND, + BS ?Extremities- no clubbing, cyanosis, or edema ?MS- no significant deformity or atrophy ?Skin- no rash or lesion ?Psych- euthymic mood, full affect ?Neuro- strength and sensation are intact ? ? ?Wt Readings from Last 3 Encounters:  ?12/25/21 81.1 kg  ?09/16/21 79.4 kg  ?08/19/21 80.6 kg  ? ? ?EKG today demonstrates  ?SR ?Vent. rate 60 BPM ?PR interval 192 ms ?QRS duration 92 ms ?QT/QTcB 454/454 ms ? ?Echo 06/12/21 demonstrated ? 1. Left ventricular ejection fraction, by estimation, is 60 to 65%. The left ventricle has normal function. The left ventricle has no regional wall motion abnormalities. Left ventricular diastolic parameters are indeterminate. Elevated left ventricular end-diastolic pressure.  ? 2. Right ventricular systolic function is normal. The right ventricular  ?size is normal. There is moderately elevated pulmonary artery systolic pressure. The estimated right ventricular systolic pressure is 24.4 mmHg.  ? 3. The pericardial effusion is anterior to the right ventricle.  ? 4. The mitral valve is degenerative. Trivial mitral valve regurgitation. Mild mitral stenosis. The mean mitral valve gradient is 4.0 mmHg. Severe mitral annular calcification.  ? 5. The aortic valve is tricuspid. Aortic valve regurgitation is not  ?visualized. Mild aortic  valve sclerosis is present, with no evidence of aortic valve stenosis.  ? 6. The inferior vena cava is normal in size with greater than 50%  ?respiratory variability, suggesting right atrial pressure of 3 mm

## 2022-01-02 ENCOUNTER — Encounter: Payer: Self-pay | Admitting: Cardiovascular Disease

## 2022-01-14 ENCOUNTER — Other Ambulatory Visit: Payer: Medicare HMO

## 2022-01-15 NOTE — Addendum Note (Signed)
Addended by: Colman Cater on: 01/15/2022 05:22 PM ? ? Modules accepted: Orders ? ?

## 2022-01-16 ENCOUNTER — Other Ambulatory Visit: Payer: Medicare HMO

## 2022-01-16 ENCOUNTER — Other Ambulatory Visit: Payer: Self-pay

## 2022-01-16 DIAGNOSIS — I10 Essential (primary) hypertension: Secondary | ICD-10-CM | POA: Diagnosis not present

## 2022-01-16 DIAGNOSIS — E785 Hyperlipidemia, unspecified: Secondary | ICD-10-CM | POA: Diagnosis not present

## 2022-01-17 ENCOUNTER — Telehealth: Payer: Self-pay | Admitting: Pharmacist

## 2022-01-17 LAB — LIPID PANEL
Cholesterol: 130 mg/dL
HDL: 36 mg/dL — ABNORMAL LOW
LDL Cholesterol (Calc): 73 mg/dL
Non-HDL Cholesterol (Calc): 94 mg/dL
Total CHOL/HDL Ratio: 3.6 (calc)
Triglycerides: 127 mg/dL

## 2022-01-17 LAB — EXTRA LAV TOP TUBE

## 2022-01-17 NOTE — Progress Notes (Signed)
? ? ?Chronic Care Management ?Pharmacy Assistant  ? ?Name: Alexandra Calderon  MRN: [id number] DOB: [birth year] ? ? ?Reason for Encounter: Disease State - Hyperlipidemia Call  ?  ? ?Recent office visits:  ?None noted.  ? ?Recent consult visits:  ?None noted.  ? ?Hospital visits:  ?None in previous 6 months ? ?Medications: ?Outpatient Encounter Medications as of 01/17/2022  ?Medication Sig  ? alendronate (FOSAMAX) 70 MG tablet TAKE 1 TABLET BY MOUTH EVERY 7 DAYS WITH A FULL GLASS OF WATER ON AN EMPTY STOMACH.  ? atenolol (TENORMIN) 50 MG tablet TAKE 1 TABLET BY MOUTH TWICE A DAY  ? Cholecalciferol (VITAMIN D) 50 MCG (2000 UT) CAPS Take 2,000 Units by mouth daily.  ? diltiazem (CARDIZEM) 30 MG tablet Take 1 tablet every 4 hours AS NEEDED for heart rate >100  ? ELIQUIS 5 MG TABS tablet TAKE 1 TABLET BY MOUTH TWICE A DAY  ? losartan (COZAAR) 25 MG tablet TAKE 1 TABLET (25 MG TOTAL) BY MOUTH DAILY. PLEASE KEEP UPCOMING APPT IN NOVEMBER 2022 WITH DR. Acie Fredrickson BEFORE ANYMORE REFILLS. THANK YOU  ? omeprazole (PRILOSEC) 40 MG capsule TAKE 1 CAPSULE BY MOUTH EVERY DAY  ? rosuvastatin (CRESTOR) 5 MG tablet TAKE 1 TABLET BY MOUTH EVERY DAY  ? spironolactone (ALDACTONE) 25 MG tablet TAKE 1 TAB BY MOUTH DAILY. PLEASE KEEP UPCOMING APPT IN NOVEMBER 2022 WITH DR. Acie Fredrickson BEFORE REFILLS  ? ?Facility-Administered Encounter Medications as of 01/17/2022  ?Medication  ? 0.9 %  sodium chloride infusion  ? ? ?01/17/2022 ?Name: Alexandra Calderon MRN: [id number] DOB: 06/22/1941 ?Alexandra Calderon is a 81 y.o. year old female who is a primary care patient of Susy Frizzle, MD.  ?Comprehensive medication review performed; Spoke to patient regarding cholesterol ? ?Lipid Panel ?   ?Component Value Date/Time  ? CHOL 130 01/16/2022 0823  ? TRIG 127 01/16/2022 0823  ? HDL 36 (L) 01/16/2022 0823  ? [place] 73 01/16/2022 0823  ?  ?10-year ASCVD risk score: The 10-year ASCVD risk score (Arnett DK, et al., 2019) is: 28.6% ?  Values used to calculate the score: ?    Age:  81 years ?    Sex: Female ?    Is Non-Hispanic African American: No ?    Diabetic: No ?    Tobacco smoker: No ?    Systolic Blood Pressure: 588 mmHg ?    Is BP treated: Yes ?    HDL Cholesterol: 36 mg/dL ?    Total Cholesterol: 130 mg/dL ? ?Current antihyperlipidemic regimen:  ?Rosuvastatin '5mg'$  daily ? ?Previous antihyperlipidemic medications tried: None  ? ?ASCVD risk enhancing conditions: HTN ? ?What recent interventions/DTPs have been made by any provider to improve Cholesterol control since last CPP Visit:  ?Patient was having repeat Cholesterol on 01/16/22. ? ?Any recent hospitalizations or ED visits since last visit with CPP?  ?No ? ?What diet changes have been made to improve Cholesterol?  ?Lipids were just rechecked yesterday 01/16/22. ? ?What exercise is being done to improve Cholesterol?  ?Patient reported she tries to walk weather permitting.  ? ?Adherence Review: ?Does the patient have >5 day gap between last estimated fill dates? No ? ? ?Care Gaps ?  ?AWV: last done 07/23/20 ?Colonoscopy: done 12/14/20 ?DM Eye Exam: N/A ?DM Foot Exam:N/A ?Microalbumin:N/A ?HbgAIC: N/A ?DEXA: 03/01/18 ?Mammogram: 07/23/21 ?  ?  ?Star Rating Drugs: ?Losartan 25 mg - last filled 10/18/21 90 days  ?Rosuvastatin 5 mg - last filled 10/21/21 30 days  ? ? ?  Future Appointments  ?Date Time Provider [place]  ?05/29/2022 11:30 AM [facility] PHARMACIST [facility]  ? ? ? ?Liza Showfety, CCMA ?Clinical Pharmacist Assistant  ?([phone] ? ?

## 2022-01-28 ENCOUNTER — Other Ambulatory Visit: Payer: Self-pay | Admitting: Family Medicine

## 2022-01-28 ENCOUNTER — Ambulatory Visit (INDEPENDENT_AMBULATORY_CARE_PROVIDER_SITE_OTHER): Payer: Medicare HMO | Admitting: Family Medicine

## 2022-01-28 VITALS — BP 142/58 | HR 66 | Temp 98.0°F | Wt 179.0 lb

## 2022-01-28 DIAGNOSIS — J312 Chronic pharyngitis: Secondary | ICD-10-CM

## 2022-01-28 MED ORDER — CEPHALEXIN 500 MG PO CAPS: 500.0000 mg | ORAL_CAPSULE | Freq: Once | ORAL | 0 refills | Status: DC | PRN

## 2022-01-28 NOTE — Progress Notes (Signed)
? ?Subjective:  ? ? Patient ID: Alexandra Calderon, female    DOB: 1941-06-09, 81 y.o.   MRN: [id number] ? ?HPI ? ?Patient reports a chronic sore throat.  The pain is underneath the left submandibular gland.  She states that the pain comes and goes and has been there for over a year.  She denies any fevers chills or night sweats.  On examination there is no erythema in the posterior oropharynx.  There is no visible swelling or abnormality in the posterior oropharynx.  There is no lymphadenopathy in the neck.  There is no palpable mass or swelling or tenderness to palpation.  She does report having to clear her throat constantly.  I suspect that some of the irritation in her throat could be from silent laryngal esophageal reflux. ?Past Medical History:  ?Diagnosis Date  ? Allergy   ? Arthritis   ? Breast cancer ([place])   ? right  ? Cataract   ? bilateral  ? Colon adenomas 2003,2006  ? Constipation   ? Diverticulosis   ? Dysrhythmia   ? Esophagitis   ? Fatty liver   ? GERD (gastroesophageal reflux disease)   ? Hearing loss   ? History of kidney stones   ? Hyperlipidemia   ? Hypertension   ? Irritable bowel syndrome 08/27/2012  ? Kidney stones   ? Osteopenia   ? Palpitations   ? Personal history of radiation therapy   ? Pneumonia   ? Polyp, stomach   ? PVC (premature ventricular contraction)   ? PVC (premature ventricular contraction)   ? UTI (lower urinary tract infection)   ? ?Past Surgical History:  ?Procedure Laterality Date  ? BREAST LUMPECTOMY Right 07/01/11  ? w/sentinal node bx - right side  ? COLONOSCOPY W/ BIOPSIES    ? DILATION AND CURETTAGE OF UTERUS    ? ESOPHAGOGASTRODUODENOSCOPY    ? kidney stones    ? LAPAROSCOPIC CHOLECYSTECTOMY SINGLE SITE WITH INTRAOPERATIVE CHOLANGIOGRAM N/A 04/25/2021  ? Procedure: LAPAROSCOPIC CHOLECYSTECTOMY SINGLE SITE WITH INTRAOPERATIVE CHOLANGIOGRAM, POSSIBLE NEEDLE CORE LIVER BX;  Surgeon: Alexandra Boston, MD;  Location: [facility];  Service: General;  Laterality: N/A;  ? shoulder  arthroscopy rt 2009    ? SHOULDER SURGERY    ? ?Current Outpatient Medications on File Prior to Visit  ?Medication Sig Dispense Refill  ? alendronate (FOSAMAX) 70 MG tablet TAKE 1 TABLET BY MOUTH EVERY 7 DAYS WITH A FULL GLASS OF WATER ON AN EMPTY STOMACH. 12 tablet 3  ? atenolol (TENORMIN) 50 MG tablet TAKE 1 TABLET BY MOUTH TWICE A DAY 180 tablet 3  ? Cholecalciferol (VITAMIN D) 50 MCG (2000 UT) CAPS Take 2,000 Units by mouth daily.    ? diltiazem (CARDIZEM) 30 MG tablet Take 1 tablet every 4 hours AS NEEDED for heart rate >100 30 tablet 1  ? ELIQUIS 5 MG TABS tablet TAKE 1 TABLET BY MOUTH TWICE A DAY 60 tablet 3  ? losartan (COZAAR) 25 MG tablet TAKE 1 TABLET (25 MG TOTAL) BY MOUTH DAILY. PLEASE KEEP UPCOMING APPT IN NOVEMBER 2022 WITH Alexandra Calderon BEFORE ANYMORE REFILLS. THANK YOU 90 tablet 3  ? omeprazole (PRILOSEC) 40 MG capsule TAKE 1 CAPSULE BY MOUTH EVERY DAY 90 capsule 3  ? rosuvastatin (CRESTOR) 5 MG tablet TAKE 1 TABLET BY MOUTH EVERY DAY 30 tablet 2  ? spironolactone (ALDACTONE) 25 MG tablet TAKE 1 TAB BY MOUTH DAILY. PLEASE KEEP UPCOMING APPT IN NOVEMBER 2022 WITH Alexandra Calderon BEFORE REFILLS 90 tablet 2  ? ?  Current Facility-Administered Medications on File Prior to Visit  ?Medication Dose Route Frequency Provider Last Rate Last Admin  ? 0.9 %  sodium chloride infusion  500 mL Intravenous Once Gatha Mayer, MD      ? ?Allergies  ?Allergen Reactions  ? Gatifloxacin   ?  unknown  ? Nitrofurantoin Macrocrystal   ?  unknown  ? Azithromycin Rash  ? Levofloxacin Rash  ?  REACTION: rash ?  ? Sulfamethoxazole-Trimethoprim Rash and Other (See Comments)  ?  Chills, fever  ? Sulfonamide Derivatives Rash and Other (See Comments)  ?  Chills, fever  ? ?Social History  ? ?Socioeconomic History  ? Marital status: Married  ?  Spouse name: Not on file  ? Number of children: 2  ? Years of education: Not on file  ? Highest education level: Not on file  ?Occupational History  ? Occupation: retired  ?Tobacco Use  ? Smoking  status: Never  ? Smokeless tobacco: Never  ? Tobacco comments:  ?  Never smoke 12/25/21  ?Vaping Use  ? Vaping Use: Never used  ?Substance and Sexual Activity  ? Alcohol use: No  ? Drug use: No  ? Sexual activity: Yes  ?Other Topics Concern  ? Not on file  ?Social History Narrative  ? Spouse:  Alexandra Calderon  ? Children:  ? Alexandra Calderon- age 60- [place]  ? Alexandra Calderon- age 16- [place]  ? ?Social Determinants of Health  ? ?Financial Resource Strain: Low Risk   ? Difficulty of Paying Living Expenses: Not very hard  ?Food Insecurity: Not on file  ?Transportation Needs: Not on file  ?Physical Activity: Not on file  ?Stress: Not on file  ?Social Connections: Not on file  ?Intimate Partner Violence: Not on file  ? ? ?Review of Systems  ?All other systems reviewed and are negative. ? ?   ?Objective:  ? Physical Exam ?Constitutional:   ?   General: She is not in acute distress. ?   Appearance: Normal appearance. She is normal weight. She is not ill-appearing, toxic-appearing or diaphoretic.  ?HENT:  ?   Right Ear: Tympanic membrane and ear canal normal.  ?   Left Ear: Tympanic membrane and ear canal normal.  ?   Mouth/Throat:  ?   Mouth: Mucous membranes are moist. No oral lesions or angioedema.  ?   Dentition: No gingival swelling or gum lesions.  ?   Tongue: No lesions.  ?   Tonsils: No tonsillar exudate or tonsillar abscesses.  ?Neck:  ?   Thyroid: No thyroid mass.  ?Cardiovascular:  ?   Rate and Rhythm: Normal rate and regular rhythm.  ?   Pulses: Normal pulses.  ?   Heart sounds: Normal heart sounds. No murmur heard. ?  No friction rub. No gallop.  ?Pulmonary:  ?   Effort: Pulmonary effort is normal. No respiratory distress.  ?   Breath sounds: Normal breath sounds. No stridor.  ?Musculoskeletal:  ?   Cervical back: Normal range of motion and neck supple. No pain with movement.  ?Lymphadenopathy:  ?   Cervical:  ?   Right cervical: No superficial or deep cervical adenopathy. ?   Left cervical: No superficial or deep  cervical adenopathy.  ?Neurological:  ?   Mental Status: She is alert.  ? ? ? ? ? ?   ?Assessment & Plan:  ?Chronic sore throat - Plan: Ambulatory referral to ENT ?I see no abnormality today on exam to explain the chronic intermittent  sore throat.  I reassured the patient that her physical exam is normal and she denies any symptoms that make me concerned about malignancy.  Specifically she denies any hemoptysis or hematemesis or stridor or dysphagia or odynophagia.  However I would consult ENT for laryngoscopy.  I suspect silent reflux is the most likely explanation. ? ?

## 2022-01-30 ENCOUNTER — Ambulatory Visit: Payer: Medicare HMO | Admitting: Family Medicine

## 2022-02-01 ENCOUNTER — Other Ambulatory Visit: Payer: Self-pay | Admitting: Cardiovascular Disease

## 2022-02-11 DIAGNOSIS — H25813 Combined forms of age-related cataract, bilateral: Secondary | ICD-10-CM | POA: Diagnosis not present

## 2022-03-05 DIAGNOSIS — H2511 Age-related nuclear cataract, right eye: Secondary | ICD-10-CM | POA: Diagnosis not present

## 2022-03-05 DIAGNOSIS — H25811 Combined forms of age-related cataract, right eye: Secondary | ICD-10-CM | POA: Diagnosis not present

## 2022-03-05 DIAGNOSIS — H25812 Combined forms of age-related cataract, left eye: Secondary | ICD-10-CM | POA: Diagnosis not present

## 2022-03-09 ENCOUNTER — Other Ambulatory Visit (HOSPITAL_COMMUNITY): Payer: Self-pay | Admitting: Physician Assistant

## 2022-03-19 DIAGNOSIS — H25812 Combined forms of age-related cataract, left eye: Secondary | ICD-10-CM | POA: Diagnosis not present

## 2022-03-19 DIAGNOSIS — H2512 Age-related nuclear cataract, left eye: Secondary | ICD-10-CM | POA: Diagnosis not present

## 2022-03-27 ENCOUNTER — Telehealth: Payer: Medicare HMO

## 2022-04-01 DIAGNOSIS — R070 Pain in throat: Secondary | ICD-10-CM | POA: Diagnosis not present

## 2022-04-01 DIAGNOSIS — K219 Gastro-esophageal reflux disease without esophagitis: Secondary | ICD-10-CM | POA: Diagnosis not present

## 2022-04-01 DIAGNOSIS — Z79899 Other long term (current) drug therapy: Secondary | ICD-10-CM | POA: Diagnosis not present

## 2022-04-01 DIAGNOSIS — Z8379 Family history of other diseases of the digestive system: Secondary | ICD-10-CM | POA: Insufficient documentation

## 2022-04-14 DIAGNOSIS — Z0100 Encounter for examination of eyes and vision without abnormal findings: Secondary | ICD-10-CM | POA: Diagnosis not present

## 2022-04-14 DIAGNOSIS — H524 Presbyopia: Secondary | ICD-10-CM | POA: Diagnosis not present

## 2022-04-18 DIAGNOSIS — N3946 Mixed incontinence: Secondary | ICD-10-CM | POA: Insufficient documentation

## 2022-04-18 DIAGNOSIS — Z87440 Personal history of urinary (tract) infections: Secondary | ICD-10-CM | POA: Insufficient documentation

## 2022-04-18 DIAGNOSIS — R82998 Other abnormal findings in urine: Secondary | ICD-10-CM | POA: Diagnosis not present

## 2022-04-18 DIAGNOSIS — Z853 Personal history of malignant neoplasm of breast: Secondary | ICD-10-CM | POA: Diagnosis not present

## 2022-04-18 DIAGNOSIS — N390 Urinary tract infection, site not specified: Secondary | ICD-10-CM | POA: Diagnosis not present

## 2022-04-18 DIAGNOSIS — R300 Dysuria: Secondary | ICD-10-CM | POA: Diagnosis not present

## 2022-04-18 DIAGNOSIS — N952 Postmenopausal atrophic vaginitis: Secondary | ICD-10-CM | POA: Diagnosis not present

## 2022-04-18 DIAGNOSIS — R32 Unspecified urinary incontinence: Secondary | ICD-10-CM | POA: Diagnosis not present

## 2022-04-21 NOTE — Progress Notes (Unsigned)
Cardiology Office Note:    Date:  04/22/2022   ID:  Svara, Twyman 05-01-1941, MRN [id number]  PCP:  Susy Frizzle, MD  [facility] Cardiologist:  Mertie Moores, MD  [place] Electrophysiologist:  None   Chief Complaint: 6 months follow-up  History of Present Illness:    Alexandra Calderon is a 81 y.o. female with a hx of paroxysmal atrial fibrillation/flutter on Eliquis for anticoagulation, coronary calcification on CT, hypertension, hyperlipidemia and PVC's seen for follow-up.  The patient was initially diagnosed with atrial flutter 05/18/21 after presenting to the ED with symptoms of palpitations. ECG read as afib, appears more consistent with an atrial flutter. She was started on a diltiazem bolus and converted to SR. Patient was started on Eliquis for a CHADS2VASC score of 5.   Echocardiogram August 2022 with LV function of 60 to 65%, no regional wall motion abnormality, normal RV function.  RVSP 50 mmHg.  Mild mitral stenosis.  Last seen in atrial fibrillation clinic March 2023.  Here for follow-up. Overall she is doing well from a cardiac perspective. Says on 03/09/2022 she believes she went into A-fib and experienced a "fluttering episode" that lasted around 40 minutes and saw this on her Apple Watch. She could not tell her heart rate and blood pressure at that time. For this episode, she took one Cardizem tablet and this episode went away after taking this medication. Recently seen by her doctor in April who said her blood pressures were "low normal" and she stopped her Spironolactone last week and has not taken any Spironolactone this week. She has been monitoring her blood pressures at home with an arm cuff over the past week and her average readings after taking her medication have been around 105/54 and around mid 120's/60s sometimes before taking her medication. Highest blood pressure reading on her log was 140/74. She denies any orthostatic dizziness, syncope, or  presyncope. She wants to know if she needs to continue taking Spironolactone or if she needs to stop this. She denies any chest pain, shortness of breath, PND, orthopnea, weight changes, or issues with bleeding. Says she has had a history of PVCs and sometimes experiences a skipped beat but denies any palpitations. She denies any other chief complaints. She denies any CP, shortness of breath, bleeding issues, palpitations, PND, orthopnea, or significant swelling although she does state she has some mild swelling from being on her feet during the day though this does not seem to be acute and does not appear to be bothersome to her.     Past Medical History:  Diagnosis Date   Allergy    Arthritis    Breast cancer ([place])    right   Cataract    bilateral   Colon adenomas 2003,2006   Constipation    Diverticulosis    Dysrhythmia    Esophagitis    Fatty liver    GERD (gastroesophageal reflux disease)    Hearing loss    History of kidney stones    Hyperlipidemia    Hypertension    Irritable bowel syndrome 08/27/2012   Kidney stones    Osteopenia    Palpitations    Personal history of radiation therapy    Pneumonia    Polyp, stomach    PVC (premature ventricular contraction)    PVC (premature ventricular contraction)    UTI (lower urinary tract infection)     Past Surgical History:  Procedure Laterality Date   BREAST LUMPECTOMY Right 07/01/11  w/sentinal node bx - right side   COLONOSCOPY W/ BIOPSIES     DILATION AND CURETTAGE OF UTERUS     ESOPHAGOGASTRODUODENOSCOPY     kidney stones     LAPAROSCOPIC CHOLECYSTECTOMY SINGLE SITE WITH INTRAOPERATIVE CHOLANGIOGRAM N/A 04/25/2021   Procedure: LAPAROSCOPIC CHOLECYSTECTOMY SINGLE SITE WITH INTRAOPERATIVE CHOLANGIOGRAM, POSSIBLE NEEDLE CORE LIVER BX;  Surgeon: [name], MD;  Location: [facility];  Service: General;  Laterality: N/A;   shoulder arthroscopy rt 2009     SHOULDER SURGERY      Current Medications: Current Meds   Medication Sig   alendronate (FOSAMAX) 70 MG tablet TAKE 1 TABLET BY MOUTH EVERY 7 DAYS WITH A FULL GLASS OF WATER ON AN EMPTY STOMACH.   atenolol (TENORMIN) 50 MG tablet TAKE 1 TABLET BY MOUTH TWICE A DAY   cephALEXin (KEFLEX) 500 MG capsule Take 1 capsule (500 mg total) by mouth once as needed for up to 1 dose (after intercourse).   Cholecalciferol (VITAMIN D) 50 MCG (2000 UT) CAPS Take 2,000 Units by mouth daily.   diltiazem (CARDIZEM) 30 MG tablet Take 1 tablet every 4 hours AS NEEDED for heart rate >100   Docusate Sodium (DSS) 100 MG CAPS Take by mouth daily.   ELIQUIS 5 MG TABS tablet TAKE 1 TABLET BY MOUTH TWICE A DAY   losartan (COZAAR) 25 MG tablet TAKE 1 TABLET (25 MG TOTAL) BY MOUTH DAILY. PLEASE KEEP UPCOMING APPT IN NOVEMBER 2022 WITH DR. Acie Fredrickson BEFORE ANYMORE REFILLS. THANK YOU   omeprazole (PRILOSEC) 40 MG capsule TAKE 1 CAPSULE BY MOUTH EVERY DAY   rosuvastatin (CRESTOR) 5 MG tablet TAKE 1 TABLET BY MOUTH EVERY DAY   [DISCONTINUED] spironolactone (ALDACTONE) 25 MG tablet TAKE 1 TAB BY MOUTH DAILY. PLEASE KEEP UPCOMING APPT IN NOVEMBER 2022 WITH DR. Acie Fredrickson BEFORE REFILLS   Current Facility-Administered Medications for the 04/22/22 encounter (Office Visit) with Finis Bud, NP  Medication   0.9 %  sodium chloride infusion     Allergies:   Gatifloxacin, Nitrofurantoin macrocrystal, Azithromycin, Levofloxacin, Sulfamethoxazole-trimethoprim, and Sulfonamide derivatives   Social History   Socioeconomic History   Marital status: Married    Spouse name: Not on file   Number of children: 2   Years of education: Not on file   Highest education level: Not on file  Occupational History   Occupation: retired  Tobacco Use   Smoking status: Never   Smokeless tobacco: Never   Tobacco comments:    Never smoke 12/25/21  Vaping Use   Vaping Use: Never used  Substance and Sexual Activity   Alcohol use: No   Drug use: No   Sexual activity: Yes  Other Topics Concern   Not on  file  Social History Narrative   Spouse:  [name]   Children:   [name]- age 57- [place]- age 38- [place]   Social Determinants of Health   Financial Resource Strain: Low Risk  (03/15/2021)   Overall Financial Resource Strain (CARDIA)    Difficulty of Paying Living Expenses: Not very hard  Food Insecurity: Not on file  Transportation Needs: Not on file  Physical Activity: Not on file  Stress: Not on file  Social Connections: Not on file     Family History: The patient's family history includes Colon cancer in her mother; Diabetes in her maternal aunt and paternal grandmother; Hodgkin's lymphoma in her father; Lymphoma in her father. There is no history of Colon polyps, Rectal cancer, Stomach cancer, Pancreatic cancer,  or Esophageal cancer.    ROS:    Review of Systems  Constitutional: Negative.   HENT: Negative.    Eyes: Negative.   Respiratory: Negative.    Cardiovascular:  Positive for palpitations and leg swelling. Negative for chest pain, orthopnea, claudication and PND.       One episode on 03/09/2022 that was resolved with one time dose of Cardizem PRN. See HPI. States she does have some mild leg swelling from being on her feet during the day.   Gastrointestinal: Negative.   Genitourinary: Negative.   Musculoskeletal: Negative.   Skin: Negative.   Neurological: Negative.   Endo/Heme/Allergies: Negative.   Psychiatric/Behavioral: Negative.      Please see the history of present illness.    All other systems reviewed and are negative.   EKGs/Labs/Other Studies Reviewed:    EKG: 12 lead EKG was ordered today and was reviewed by me. 12 lead EKG showed sinus bradycardia, 59 bpm, and appears similar to previous EKG on 12/25/2021, with no acute changes.    The following studies were reviewed today:  Echo 05/2021  1. Left ventricular ejection fraction, by estimation, is 60 to 65%. The  left ventricle has normal function. The left ventricle has no  regional  wall motion abnormalities. Left ventricular diastolic parameters are  indeterminate. Elevated left ventricular  end-diastolic pressure.   2. Right ventricular systolic function is normal. The right ventricular  size is normal. There is moderately elevated pulmonary artery systolic  pressure. The estimated right ventricular systolic pressure is 11.9 mmHg.   3. The pericardial effusion is anterior to the right ventricle.   4. The mitral valve is degenerative. Trivial mitral valve regurgitation.  Mild mitral stenosis. The mean mitral valve gradient is 4.0 mmHg. Severe  mitral annular calcification.   5. The aortic valve is tricuspid. Aortic valve regurgitation is not  visualized. Mild aortic valve sclerosis is present, with no evidence of  aortic valve stenosis.   6. The inferior vena cava is normal in size with greater than 50%  respiratory variability, suggesting right atrial pressure of 3 mmHg.   48 hours Holter monitor on 11/25/2018 showed: NSR with occasional PAC's, Rare PVC's with single insignificant non-sustained atrial run.    Recent Labs: 05/18/2021: Magnesium 2.0; TSH 2.201 07/17/2021: ALT 9; BUN 15; Creat 1.01; Hemoglobin 13.0; Platelets 211; Potassium 4.9; Sodium 141  Recent Lipid Panel    Component Value Date/Time   CHOL 130 01/16/2022 0823   TRIG 127 01/16/2022 0823   HDL 36 (L) 01/16/2022 0823   CHOLHDL 3.6 01/16/2022 0823   VLDL 24.2 04/29/2017 0800   LDLCALC 73 01/16/2022 0823     Risk Assessment/Calculations:    CHA2DS2-VASc Score = 5   This indicates a 7.2% annual risk of stroke. The patient's score is based upon: CHF History: 0 HTN History: 1 Diabetes History: 0 Stroke History: 0 Vascular Disease History: 1 Age Score: 2 Gender Score: 1      Physical Exam:    VS:  BP 130/60   Pulse 63   Ht '5\' 6"'$  (1.676 m)   Wt 176 lb (79.8 kg)   LMP  (LMP Unknown)   SpO2 98%   BMI 28.41 kg/m     Wt Readings from Last 3 Encounters:  04/22/22 176 lb  (79.8 kg)  01/28/22 179 lb (81.2 kg)  12/25/21 178 lb 12.8 oz (81.1 kg)     GEN: Well nourished, well developed in no acute distress HEENT: Normal NECK: No JVD;  No carotid bruits CARDIAC: Overall regular rate and rhythm with rare premature beats noted, no murmurs, rubs, gallops RESPIRATORY:  Diminished and clear to auscultation without rales, wheezing or rhonchi  ABDOMEN: Soft, non-tender, non-distended MUSCULOSKELETAL:  Trace edema, nonpitting along bilateral lower legs consistent with chronic venous insufficiency; No deformity  SKIN: Warm and dry NEUROLOGIC:  Alert and oriented x 3 PSYCHIATRIC:  Normal affect   ASSESSMENT AND PLAN:    Paroxysmal A-fib/A-flutter, PVC's Patient had one episode on 03/09/22 of a fluttering episode and she believes she was in A-fib for about 40 minutes that was noted on Apple watch and this was relieved with her Cardizem medication. A 12 lead EKG was ordered today due to occasional early ventricular beats noted on ascultation and 12 lead EKG today found that patient remains in sinus rhythm, 59 bpm and regular rhythm. She is followed by the A-fib clinic and was last seen in March 2023. Continue Cardizem 30 mg Q4H PRN for heart rate > 100. Continue Atenolol 50 mg PO BID.   2. Chronic anticoagulation Pt is on Eliquis 5 mg PO BID and is tolerating this well. Denies any acute bleeding. CHA2DS2-VASc Score = 5 . Continue Eliquis 5 mg PO BID. She does not qualify for reduced dose of Eliquis.   3. CAD  Pt was noted to have aortic atherosclerosis noted on CT scan ob abdomen on 04/18/2020 and has a hx of coronary calcification seen on CT. Stable with no anginal symptoms. No indication for ischemic evaluation.    4. Hypertension Recent soft blood pressures at home and from previous visit with her primary doctor. She has stopped Spironolactone X  1 week and we will d/c this medication from her list. Discussed to monitor BP at home at least 2 hours after medications and  sitting for 5-10 minutes. Discussed with her that if her SBP <100 or consistently remains > 140 she will need to let our team know. Discussed with her that a normal blood pressure reading is <120/80. Her blood pressure today in the office is stable at 130/60. Informed her to stay well hydrated. Will continue to monitor.    5. HLD Last lipid panel stable on 01/16/22. This is being managed by her PCP. Continue current regimen of Crestor 5 mg PO daily. Will continue to monitor.   6. Chronic venous insufficieny  Her description of her mild lower extremity swelling bilaterally appears to be due to chronic venous insufficiency since this occurs during the day while she is on her feet. I informed her to wear compression stockings during the day while she is on her feet. Will continue to monitor.   7. Disposition: F/U with an APP in 6 months or sooner if needed.   Medication Adjustments/Labs and Tests Ordered: Current medicines are reviewed at length with the patient today.  Concerns regarding medicines are outlined above.  Orders Placed This Encounter  Procedures   EKG 12-Lead   No orders of the defined types were placed in this encounter.   Patient Instructions  Medication Instructions:  Your physician recommends that you continue on your current medications as directed. Please refer to the Current Medication list given to you today. REMOVED: spironolactone (Aldactone) from your medication list  *If you need a refill on your cardiac medications before your next appointment, please call your pharmacy*   Lab Work: NONE If you have labs (blood work) drawn today and your tests are completely normal, you will receive your results only by: [employer] (  if you have MyChart) OR A paper copy in the mail If you have any lab test that is abnormal or we need to change your treatment, we will call you to review the results.   Testing/Procedures: NONE   Follow-Up: At [facility], you and  your health needs are our priority.  As part of our continuing mission to provide you with exceptional heart care, we have created designated Provider Care Teams.  These Care Teams include your primary Cardiologist (physician) and Advanced Practice Providers (APPs -  Physician Assistants and Nurse Practitioners) who all work together to provide you with the care you need, when you need it.   Your next appointment:   6 month(s)  The format for your next appointment:   In Person  Provider:   Mertie Moores, MD     Other Instructions Please call the office or send a my chart message if your Blood pressures run low <110   Important Information About Sugar        Signed, Finis Bud, NP  04/22/2022 12:04 PM    [place]

## 2022-04-22 ENCOUNTER — Ambulatory Visit: Payer: Medicare HMO | Admitting: Nurse Practitioner

## 2022-04-22 ENCOUNTER — Encounter: Payer: Self-pay | Admitting: Physician Assistant

## 2022-04-22 VITALS — BP 130/60 | HR 63 | Ht 66.0 in | Wt 176.0 lb

## 2022-04-22 DIAGNOSIS — I480 Paroxysmal atrial fibrillation: Secondary | ICD-10-CM

## 2022-04-22 DIAGNOSIS — I10 Essential (primary) hypertension: Secondary | ICD-10-CM | POA: Diagnosis not present

## 2022-04-22 DIAGNOSIS — I493 Ventricular premature depolarization: Secondary | ICD-10-CM

## 2022-04-22 DIAGNOSIS — I2510 Atherosclerotic heart disease of native coronary artery without angina pectoris: Secondary | ICD-10-CM

## 2022-04-22 DIAGNOSIS — I872 Venous insufficiency (chronic) (peripheral): Secondary | ICD-10-CM

## 2022-04-22 DIAGNOSIS — Z7901 Long term (current) use of anticoagulants: Secondary | ICD-10-CM | POA: Diagnosis not present

## 2022-04-22 NOTE — Patient Instructions (Signed)
Medication Instructions:  Your physician recommends that you continue on your current medications as directed. Please refer to the Current Medication list given to you today. REMOVED: spironolactone (Aldactone) from your medication list  *If you need a refill on your cardiac medications before your next appointment, please call your pharmacy*   Lab Work: NONE If you have labs (blood work) drawn today and your tests are completely normal, you will receive your results only by: [employer] (if you have MyChart) OR A paper copy in the mail If you have any lab test that is abnormal or we need to change your treatment, we will call you to review the results.   Testing/Procedures: NONE   Follow-Up: At [facility], you and your health needs are our priority.  As part of our continuing mission to provide you with exceptional heart care, we have created designated Provider Care Teams.  These Care Teams include your primary Cardiologist (physician) and Advanced Practice Providers (APPs -  Physician Assistants and Nurse Practitioners) who all work together to provide you with the care you need, when you need it.   Your next appointment:   6 month(s)  The format for your next appointment:   In Person  Provider:   Mertie Moores, MD     Other Instructions Please call the office or send a my chart message if your Blood pressures run low <110   Important Information About Sugar

## 2022-05-22 NOTE — Progress Notes (Signed)
Chronic Care Management Pharmacy Note  06/05/2022 Name:  Alexandra Calderon MRN:  [id number] DOB:  1941/03/29  Summary: PharmD FU.  Back on Crestor.  Counseled on importance of adherence and importance of Eliquis in stroke prevention with Afib.  Dose is appropriate.  Recommendations/Changes made from today's visit: No changes made at today's visit  Plan: Fu 1 year  Subjective: Alexandra Calderon is an 81 y.o. year old female who is a primary patient of Pickard, Cammie Mcgee, MD.  The CCM team was consulted for assistance with disease management and care coordination needs.    Engaged with patient by telephone for follow up visit in response to provider referral for pharmacy case management and/or care coordination services.   Consent to Services:  The patient was given the following information about Chronic Care Management services today, agreed to services, and gave verbal consent: 1. CCM service includes personalized support from designated clinical staff supervised by the primary care provider, including individualized plan of care and coordination with other care providers 2. 24/7 contact phone numbers for assistance for urgent and routine care needs. 3. Service will only be billed when office clinical staff spend 20 minutes or more in a month to coordinate care. 4. Only one practitioner may furnish and bill the service in a calendar month. 5.The patient may stop CCM services at any time (effective at the end of the month) by phone call to the office staff. 6. The patient will be responsible for cost sharing (co-pay) of up to 20% of the service fee (after annual deductible is met). Patient agreed to services and consent obtained.  Patient Care Team: Susy Frizzle, MD as PCP - General (Family Medicine) Nahser, Wonda Cheng, MD as PCP - Cardiology (Cardiology) [name], [facility] as Pharmacist (Pharmacist) [name], MD as Consulting Physician (General Surgery) Gatha Mayer, MD as  Consulting Physician (Gastroenterology)  Recent office visits: 03/05/21 Dennard Schaumann) - scheduled for R upper quadrant [place] fo confirm diagnosis of gallstones.  Confirmed diagnosis of multiple stones.  Patient was also instructed to hold Crestor.  03/04/21 Edsel Petrin) - given Keflex ABX to take for trace glucose in urine and bacteria. To follow up with PCP for gallbladder pain.  Recent consult visits: 04/22/22 (Cardio) - no changes to medications, RTC 6 months for FU.  Eliquis dose is stable.  Hospital visits: None in previous 6 months   Objective:  Lab Results  Component Value Date   CREATININE 1.01 (H) 07/17/2021   BUN 15 07/17/2021   GFR 63.38 04/29/2017   GFRNONAA 47 (L) 05/18/2021   GFRAA 66 03/14/2021   NA 141 07/17/2021   K 4.9 07/17/2021   CALCIUM 9.5 07/17/2021   CO2 29 07/17/2021   GLUCOSE 97 07/17/2021    Lab Results  Component Value Date/Time   HGBA1C 6.0 12/13/2012 11:04 AM   GFR 63.38 04/29/2017 08:00 AM   GFR 71.58 04/23/2016 08:05 AM    Last diabetic Eye exam: No results found for: "HMDIABEYEEXA"  Last diabetic Foot exam: No results found for: "HMDIABFOOTEX"   Lab Results  Component Value Date   CHOL 130 01/16/2022   HDL 36 (L) 01/16/2022   LDLCALC 73 01/16/2022   TRIG 127 01/16/2022   CHOLHDL 3.6 01/16/2022       Latest Ref Rng & Units 07/17/2021    8:24 AM 03/14/2021    9:58 AM 03/05/2021    3:47 PM  Hepatic Function  Total Protein 6.1 - 8.1 g/dL 7.1  7.0  7.0   AST 10 - 35 U/L 13  18  215   ALT 6 - 29 U/L 9  44  393   Total Bilirubin 0.2 - 1.2 mg/dL 0.7  0.7  1.0     Lab Results  Component Value Date/Time   TSH 2.201 05/18/2021 10:30 AM   TSH 4.49 04/29/2017 08:00 AM   TSH 2.280 02/10/2017 10:10 AM   FREET4 1.0 09/19/2009 12:00 AM       Latest Ref Rng & Units 07/17/2021    8:24 AM 05/18/2021    9:37 AM 04/18/2021   11:08 AM  CBC  WBC 3.8 - 10.8 Thousand/uL 4.6  5.2  4.5   Hemoglobin 11.7 - 15.5 g/dL 13.0  14.0  14.0   Hematocrit 35.0 -  45.0 % 39.7  44.0  42.8   Platelets 140 - 400 Thousand/uL 211  317  212     Lab Results  Component Value Date/Time   VD25OH 62 10/19/2013 09:04 AM   VD25OH 70 10/07/2012 08:59 AM    Clinical ASCVD: No  The 10-year ASCVD risk score (Arnett DK, et al., 2019) is: 30.7%   Values used to calculate the score:     Age: 34 years     Sex: Female     Is Non-Hispanic African American: No     Diabetic: No     Tobacco smoker: No     Systolic Blood Pressure: 614 mmHg     Is BP treated: Yes     HDL Cholesterol: 36 mg/dL     Total Cholesterol: 130 mg/dL       07/25/2021   10:31 AM 07/23/2020   10:33 AM 07/21/2019    9:32 AM  Depression screen PHQ 2/9  Decreased Interest  0 0  Down, Depressed, Hopeless 0 0 0  PHQ - 2 Score 0 0 0     Social History   Tobacco Use  Smoking Status Never  Smokeless Tobacco Never  Tobacco Comments   Never smoke 12/25/21   BP Readings from Last 3 Encounters:  04/22/22 130/60  01/28/22 (!) 142/58  12/25/21 132/76   Pulse Readings from Last 3 Encounters:  04/22/22 63  01/28/22 66  12/25/21 60   Wt Readings from Last 3 Encounters:  04/22/22 176 lb (79.8 kg)  01/28/22 179 lb (81.2 kg)  12/25/21 178 lb 12.8 oz (81.1 kg)   BMI Readings from Last 3 Encounters:  04/22/22 28.41 kg/m  01/28/22 28.89 kg/m  12/25/21 28.86 kg/m    Assessment/Interventions: Review of patient past medical history, allergies, medications, health status, including review of consultants reports, laboratory and other test data, was performed as part of comprehensive evaluation and provision of chronic care management services.   SDOH:  (Social Determinants of Health) assessments and interventions performed: Yes   Financial Resource Strain: Low Risk  (06/05/2022)   Overall Financial Resource Strain (CARDIA)    Difficulty of Paying Living Expenses: Not hard at all    SDOH Screenings   Alcohol Screen: Low Risk  (07/23/2020)   Alcohol Screen    Last Alcohol Screening  Score (AUDIT): 0  Depression (PHQ2-9): Low Risk  (07/25/2021)   Depression (PHQ2-9)    PHQ-2 Score: 0  Financial Resource Strain: Low Risk  (06/05/2022)   Overall Financial Resource Strain (CARDIA)    Difficulty of Paying Living Expenses: Not hard at all  Food Insecurity: Not on file  Housing: Not on file  Physical Activity: Not on file  Social Connections: Not on file  Stress: Not on file  Tobacco Use: Low Risk  (04/22/2022)   Patient History    Smoking Tobacco Use: Never    Smokeless Tobacco Use: Never    Passive Exposure: Not on file  Transportation Needs: Not on file    [place]  Allergies  Allergen Reactions   Gatifloxacin     unknown   Nitrofurantoin Macrocrystal     unknown   Azithromycin Rash   Levofloxacin Rash    REACTION: rash    Sulfamethoxazole-Trimethoprim Rash and Other (See Comments)    Chills, fever   Sulfonamide Derivatives Rash and Other (See Comments)    Chills, fever    Medications Reviewed Today     Reviewed by [name], [facility] (Pharmacist) on 06/05/22 at 1137  Med List Status: <None>   Medication Order Taking? Sig Documenting Provider Last Dose Status Informant  0.9 %  sodium chloride infusion [id number]   Gatha Mayer, MD  Active   alendronate (FOSAMAX) 70 MG tablet [id number] Yes TAKE 1 TABLET BY MOUTH EVERY 7 DAYS WITH A FULL GLASS OF WATER ON AN EMPTY STOMACH. Susy Frizzle, MD Taking Active   atenolol (TENORMIN) 50 MG tablet [id number] Yes TAKE 1 TABLET BY MOUTH TWICE A DAY Susy Frizzle, MD Taking Active   cephALEXin (KEFLEX) 500 MG capsule [id number] Yes Take 1 capsule (500 mg total) by mouth once as needed for up to 1 dose (after intercourse). Susy Frizzle, MD Taking Active   Cholecalciferol (VITAMIN D) 50 MCG (2000 UT) CAPS [id number] Yes Take 2,000 Units by mouth daily. [provider] Taking Active Self  diltiazem (CARDIZEM) 30 MG tablet [id number] Yes Take 1 tablet every 4 hours AS NEEDED for heart rate  >100 [name], [name], PA Taking Active   Docusate Sodium (DSS) 100 MG CAPS [id number] Yes Take by mouth daily. [provider] Taking Active   ELIQUIS 5 MG TABS tablet [id number] Yes TAKE 1 TABLET BY MOUTH TWICE A DAY [name], [name], PA Taking Active   losartan (COZAAR) 25 MG tablet [id number] Yes TAKE 1 TABLET (25 MG TOTAL) BY MOUTH DAILY. PLEASE KEEP UPCOMING APPT IN NOVEMBER 2022 WITH DR. Acie Fredrickson BEFORE ANYMORE REFILLS. THANK YOU Nahser, Wonda Cheng, MD Taking Active   omeprazole (PRILOSEC) 40 MG capsule [id number] Yes TAKE 1 CAPSULE BY MOUTH EVERY DAY Susy Frizzle, MD Taking Active   rosuvastatin (CRESTOR) 5 MG tablet [id number] Yes TAKE 1 TABLET BY MOUTH EVERY DAY Nahser, Wonda Cheng, MD Taking Active             Patient Active Problem List   Diagnosis Date Noted   Pulmonary HTN ([place]) 08/19/2021   Paroxysmal atrial fibrillation ([place]) 05/22/2021   Secondary hypercoagulable state ([place]) 05/22/2021   Personal history of breast cancer 09/16/2017   PVC's (premature ventricular contractions) 02/10/2017   Irritable bowel syndrome 08/27/2012   Breast cancer of upper-inner quadrant of right female breast ([place]) 08/18/2011   Neoplasm of breast 10/13/2010   CYSTITIS, CHRONIC 03/29/2010   Palpitations 09/19/2009   GERD 11/27/2008   Osteoarthritis 08/28/2008   ROTATOR CUFF TEAR 05/18/2008   Dyslipidemia 07/01/2007   Essential hypertension 04/07/2007   Disorder of bone and cartilage 04/07/2007    Immunization History  Administered Date(s) Administered   DTP 05/24/2007   Fluad Quad(high Dose 65+) 06/21/2019, 07/18/2020, 07/25/2021   Influenza Split 06/29/2012   Influenza Whole 10/14/1999, 07/29/2007, 07/28/2008, 07/05/2009, 07/26/2010   Influenza, High Dose Seasonal  PF 07/25/2014, 08/02/2015, 06/25/2016, 06/16/2017   Influenza,inj,Quad PF,6+ Mos 07/05/2013, 06/21/2018   PFIZER(Purple Top)SARS-COV-2 Vaccination 10/25/2019, 11/14/2019, 08/02/2020   Pneumococcal Conjugate-13 08/15/2013    Pneumococcal Polysaccharide-23 10/13/2002, 04/19/2015   Td 10/13/2006, 05/24/2007   Zoster, Live 08/05/2016    Conditions to be addressed/monitored:  HTN, PVCs, GERD/IBS, Dyslipidemia  Care Plan : General Pharmacy (Adult)  Updates made by [name], RPH since 06/05/2022 12:00 AM     Problem: HTN, PVCs, GERD/IBS, Dyslipidemia   Priority: High  Onset Date: 03/14/2021     Long-Range Goal: Patient-Specific Goal   Start Date: 03/15/2021  Expected End Date: 09/14/2021  Recent Progress: On track  Priority: High  Note:   Current Barriers:  Elevated LDL  Pharmacist Clinical Goal(s):  Patient will maintain control of cholesterol as evidenced by lipid panel  adhere to prescribed medication regimen as evidenced by pill box usage contact provider office for questions/concerns as evidenced notation of same in electronic health record through collaboration with PharmD and provider.   Interventions: 1:1 collaboration with Susy Frizzle, MD regarding development and update of comprehensive plan of care as evidenced by provider attestation and co-signature Inter-disciplinary care team collaboration (see longitudinal plan of care) Comprehensive medication review performed; medication list updated in electronic medical record  Hypertension/PVCs (BP goal <140/90) 06/05/22 -Controlled -Current treatment: Losartan 29m daily Appropriate, Effective, Safe, Accessible Atenolol 553mdaily  Appropriate, Effective, Safe, Accessible -Medications previously tried: amlodipine, irbesartan/HCTZ, telmisartan/HCTZ, spironolactone -Current home readings: 112/62, 121/65 recent readings -Current exercise habits: walks daily for about 10-15 minutes -Denies hypotensive/hypertensive symptoms -Educated on BP goals and benefits of medications for prevention of heart attack, stroke and kidney damage; Exercise goal of 150 minutes per week; Importance of home blood pressure monitoring; -Since stopping  spironolactone she has not had anymore soft BP.  Denies any recent dizziness HA, etc. -Continue medications as current, continue to monitor and let usKoreanow if BP is not at goal.  Update 11/14/21 No logs for BP readings BP controlled in office Denies any dizziness or HA Continue current medication at this time, adherence 100%  Hyperlipidemia: (LDL goal < 100) 06/05/22 -Controlled -Current treatment: Rosuvastatin 65m56maily Appropriate, Effective, Safe, Accessible -Medications previously tried: none noted  -Current exercise habits: see above -Educated on Cholesterol goals;  Benefits of statin for ASCVD risk reduction; Importance of limiting foods high in cholesterol; -She is back on Crestor after liver test normalized.  LDL is close to goal at 73. No changes needed at this time, continue on current dose and recheck at next OV.  Update 11/14/21 LDL now uncontrolled as patient was off her statin for a while due to acute liver disease. Since most recent LDL of 158, she has started back on Crestor 65mg31mily.  Tolerating medication well since restart. Recommend recheck lipids in April, if remains elevated would consider dose increase. ASCVD risk 24.9% - high risk Continue current meds until next labs.  GERD/IBS (Goal: Control symptoms) -Controlled -Current treatment  Omeprazole 40mg60mly -Medications previously tried: none noted  -Denies symptoms and takes medication appropriately -Working on trigger foods -Recommended to continue current medication   Patient Goals/Self-Care Activities Patient will:  - take medications as prescribed check blood pressure periodically, document, and provide at future appointments target a minimum of 150 minutes of moderate intensity exercise weekly  Follow Up Plan: The care management team will reach out to the patient again over the next 180 days.  Medication Assistance: None required.  Patient affirms current coverage meets  needs.  Compliance/Adherence/Medication fill history: Care Gaps: Zoster Vaccine  Star Medications: Rosuvastain 86m 0424/23 90ds - confirms supply on hand Losartan 232m04/06/23 90ds - confims supply on hand Atenolol 5028m4/06/23 90ds - she confirms supply on hand  Patient's preferred pharmacy is:  [id number] [place] - [address]: [phone]: [phone] [address] [place]: [phone]: [phone] pill box? Yes Pt endorses 100% compliance  We discussed: Benefits of medication synchronization, packaging and delivery as well as enhanced pharmacist oversight with [employer]. Patient decided to: Continue current medication management strategy  Care Plan and Follow Up Patient Decision:  Patient agrees to Care Plan and Follow-up.  Plan: The care management team will reach out to the patient again over the next 180 days.  ChriBeverly MilcharmD Clinical Pharmacist [phone]

## 2022-05-28 DIAGNOSIS — R300 Dysuria: Secondary | ICD-10-CM | POA: Diagnosis not present

## 2022-05-29 ENCOUNTER — Telehealth: Payer: Self-pay

## 2022-06-05 ENCOUNTER — Ambulatory Visit: Payer: Medicare HMO | Admitting: Pharmacist

## 2022-06-05 DIAGNOSIS — I10 Essential (primary) hypertension: Secondary | ICD-10-CM

## 2022-06-05 DIAGNOSIS — E7800 Pure hypercholesterolemia, unspecified: Secondary | ICD-10-CM

## 2022-06-05 NOTE — Patient Instructions (Addendum)
Visit Information   Goals Addressed             This Visit's Progress    Track and Manage My Blood Pressure-Hypertension   On track    Timeframe:  Long-Range Goal Priority:  High Start Date:  03/15/21                           Expected End Date: 09/14/21                      Follow Up Date 06/15/21   - check blood pressure weekly - choose a place to take my blood pressure (home, clinic or office, retail store) - write blood pressure results in a log or diary    Why is this important?   You won't feel high blood pressure, but it can still hurt your blood vessels.  High blood pressure can cause heart or kidney problems. It can also cause a stroke.  Making lifestyle changes like losing a little weight or eating less salt will help.  Checking your blood pressure at home and at different times of the day can help to control blood pressure.  If the doctor prescribes medicine remember to take it the way the doctor ordered.  Call the office if you cannot afford the medicine or if there are questions about it.     Notes:        Patient Care Plan: General Pharmacy (Adult)     Problem Identified: HTN, PVCs, GERD/IBS, Dyslipidemia   Priority: High  Onset Date: 03/14/2021     Long-Range Goal: Patient-Specific Goal   Start Date: 03/15/2021  Expected End Date: 09/14/2021  Recent Progress: On track  Priority: High  Note:   Current Barriers:  Elevated LDL  Pharmacist Clinical Goal(s):  Patient will maintain control of cholesterol as evidenced by lipid panel  adhere to prescribed medication regimen as evidenced by pill box usage contact provider office for questions/concerns as evidenced notation of same in electronic health record through collaboration with PharmD and provider.   Interventions: 1:1 collaboration with Susy Frizzle, MD regarding development and update of comprehensive plan of care as evidenced by provider attestation and co-signature Inter-disciplinary care  team collaboration (see longitudinal plan of care) Comprehensive medication review performed; medication list updated in electronic medical record  Hypertension/PVCs (BP goal <140/90) 06/05/22 -Controlled -Current treatment: Losartan '25mg'$  daily Appropriate, Effective, Safe, Accessible Atenolol '50mg'$  daily  Appropriate, Effective, Safe, Accessible -Medications previously tried: amlodipine, irbesartan/HCTZ, telmisartan/HCTZ, spironolactone -Current home readings: 112/62, 121/65 recent readings -Current exercise habits: walks daily for about 10-15 minutes -Denies hypotensive/hypertensive symptoms -Educated on BP goals and benefits of medications for prevention of heart attack, stroke and kidney damage; Exercise goal of 150 minutes per week; Importance of home blood pressure monitoring; -Since stopping spironolactone she has not had anymore soft BP.  Denies any recent dizziness HA, etc. -Continue medications as current, continue to monitor and let us know if BP is not at goal.  Update 11/14/21 No logs for BP readings BP controlled in office Denies any dizziness or HA Continue current medication at this time, adherence 100%  Hyperlipidemia: (LDL goal < 100) 06/05/22 -Controlled -Current treatment: Rosuvastatin '5mg'$  daily Appropriate, Effective, Safe, Accessible -Medications previously tried: none noted  -Current exercise habits: see above -Educated on Cholesterol goals;  Benefits of statin for ASCVD risk reduction; Importance of limiting foods high in cholesterol; -She is back on Crestor after liver  test normalized.  LDL is close to goal at 73. No changes needed at this time, continue on current dose and recheck at next OV.  Update 11/14/21 LDL now uncontrolled as patient was off her statin for a while due to acute liver disease. Since most recent LDL of 158, she has started back on Crestor '5mg'$  daily.  Tolerating medication well since restart. Recommend recheck lipids in April, if  remains elevated would consider dose increase. ASCVD risk 24.9% - high risk Continue current meds until next labs.  GERD/IBS (Goal: Control symptoms) -Controlled -Current treatment  Omeprazole '40mg'$  daily -Medications previously tried: none noted  -Denies symptoms and takes medication appropriately -Working on trigger foods -Recommended to continue current medication   Patient Goals/Self-Care Activities Patient will:  - take medications as prescribed check blood pressure periodically, document, and provide at future appointments target a minimum of 150 minutes of moderate intensity exercise weekly  Follow Up Plan: The care management team will reach out to the patient again over the next 180 days.           The patient verbalized understanding of instructions, educational materials, and care plan provided today and DECLINED offer to receive copy of patient instructions, educational materials, and care plan.  Telephone follow up appointment with pharmacy team member scheduled for: 1 year  [name], [place], PharmD, [place] Clinical Pharmacist Practitioner [place] [phone]

## 2022-06-09 ENCOUNTER — Other Ambulatory Visit: Payer: Self-pay | Admitting: Family Medicine

## 2022-06-09 DIAGNOSIS — Z1231 Encounter for screening mammogram for malignant neoplasm of breast: Secondary | ICD-10-CM

## 2022-06-20 ENCOUNTER — Other Ambulatory Visit (HOSPITAL_COMMUNITY): Payer: Self-pay

## 2022-06-20 ENCOUNTER — Telehealth (HOSPITAL_COMMUNITY): Payer: Self-pay

## 2022-06-20 MED ORDER — APIXABAN 5 MG PO TABS: 5.0000 mg | ORAL_TABLET | Freq: Two times a day (BID) | ORAL | 0 refills | Status: DC

## 2022-06-20 NOTE — Telephone Encounter (Signed)
Patient Assistance Application# [id number] approved from 06/20/22-10/12/2022

## 2022-07-01 DIAGNOSIS — L723 Sebaceous cyst: Secondary | ICD-10-CM | POA: Diagnosis not present

## 2022-07-01 DIAGNOSIS — N9089 Other specified noninflammatory disorders of vulva and perineum: Secondary | ICD-10-CM | POA: Diagnosis not present

## 2022-07-15 DIAGNOSIS — Z961 Presence of intraocular lens: Secondary | ICD-10-CM | POA: Diagnosis not present

## 2022-07-17 IMAGING — MR MR ABDOMEN WO/W CM
17 series · 48 of 48 positions shown · IV contrast (18 ml multihance)
Comparison: Ultrasound from 03/08/2021

CLINICAL DATA: Evaluate liver lesion

EXAM:
MRI ABDOMEN WITHOUT AND WITH CONTRAST
TECHNIQUE: Multiplanar multisequence MR imaging of the abdomen was performed
both before and after the administration of intravenous contrast.
CONTRAST:  18mL MULTIHANCE GADOBENATE DIMEGLUMINE 529 MG/ML IV SOLN

[Series 3: T2 · coronal · 5.0mm · 1.56mm/px · 1 of 36 slices shown (1 of 3)]
[im 1/36]
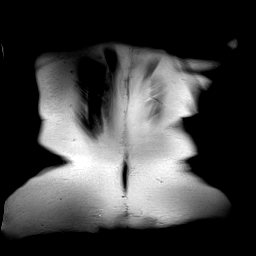

[Series 4: T1 · axial · 3.0mm · 1.19mm/px · z∈[-103,+110]mm · 5 of 144 slices shown]
[im 1/144]
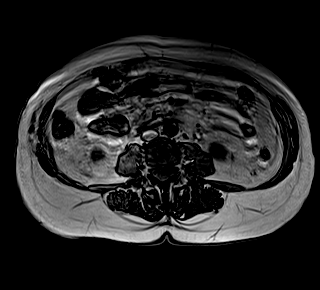
[im 36/144]
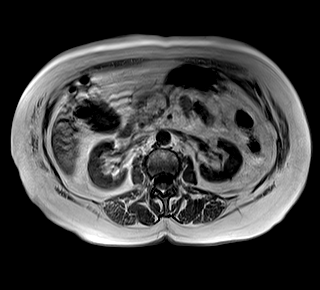
[im 72/144]
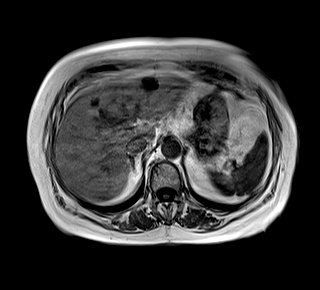
[im 108/144]
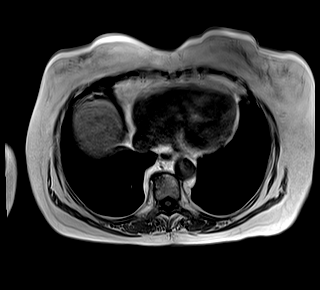
[im 144/144]
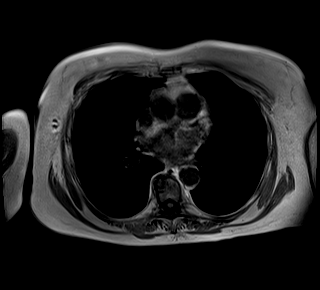

[Series 5: bSSFP · axial · 5.0mm · 1.25mm/px · z∈[-118,+104]mm · 2 of 38 slices shown]
[im 1/38]
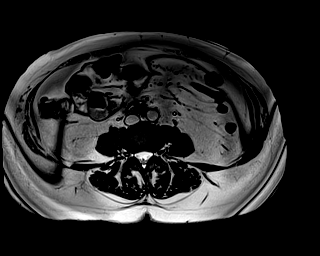
[im 38/38]
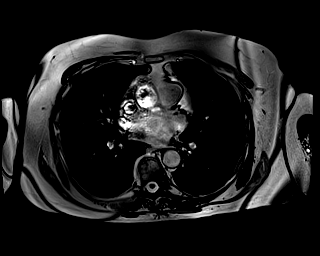

[Series 6: T2 · axial · 5.0mm · 1.48mm/px · z∈[-115,+107]mm · 2 of 38 slices shown (2 of 3)]
[im 1/38]
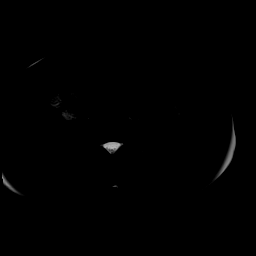
[im 38/38]
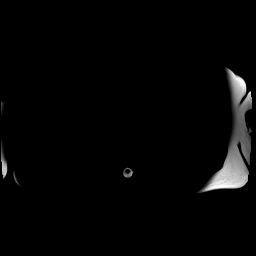

[Series 7: DWI · axial · 5.0mm · 1.42mm/px · z∈[-109,+101]mm · 5 of 108 slices shown (1 of 2)]
[im 1/108]
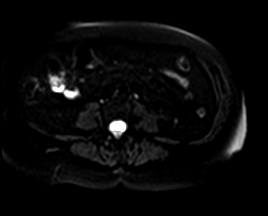
[im 27/108]
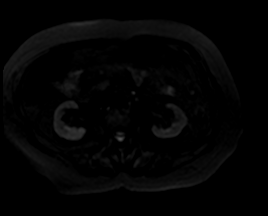
[im 54/108]
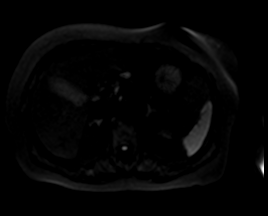
[im 81/108]
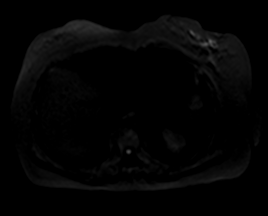
[im 108/108]
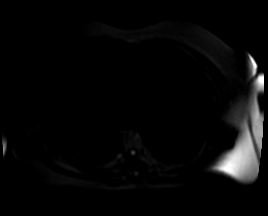

[Series 8: DWI · axial · 5.0mm · 1.42mm/px · z∈[-109,+101]mm · 2 of 36 slices shown (2 of 2)]
[im 1/36]
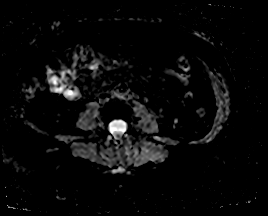
[im 36/36]
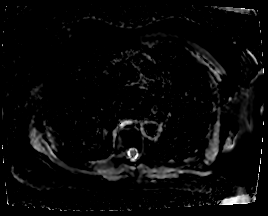

[Series 10: T2 · axial · 6.0mm · 1.19mm/px · 1 of 30 slices shown (3 of 3)]
[im 1/30]
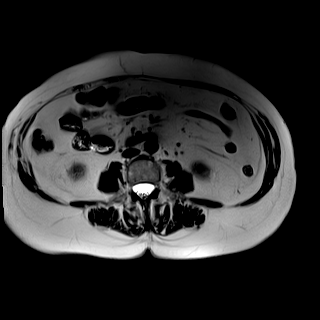

[Series 11: T1 dynamic · axial · non-contrast · 3.0mm · 1.25mm/px · z∈[-114,+99]mm · 3 of 72 slices shown]
[im 1/72]
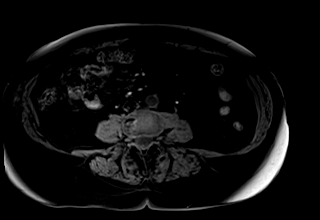
[im 36/72]
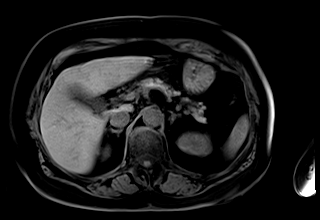
[im 72/72]
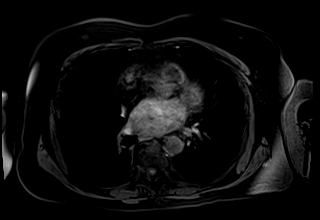

[Series 12: T1 dynamic post-contrast · axial · 3.0mm · 1.25mm/px · z∈[-114,+99]mm · 3 of 72 slices shown (1 of 9)]
[im 1/72]
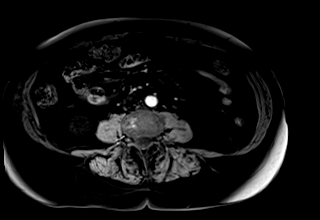
[im 36/72]
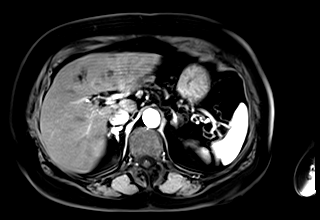
[im 72/72]
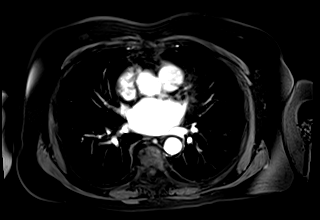

[Series 13: T1 dynamic post-contrast · axial · 3.0mm · 1.25mm/px · z∈[-114,+99]mm · 3 of 72 slices shown (2 of 9)]
[im 1/72]
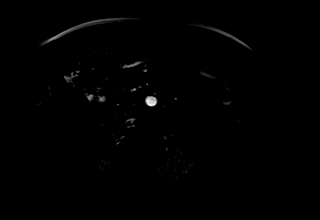
[im 36/72]
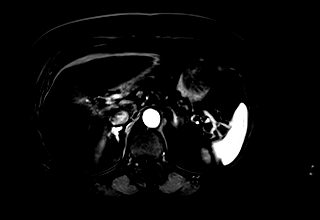
[im 72/72]
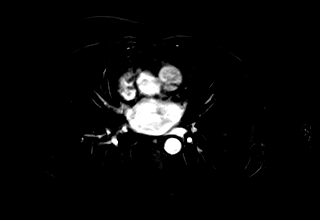

[Series 14: T1 dynamic post-contrast · axial · 3.0mm · 1.25mm/px · z∈[-114,+99]mm · 3 of 72 slices shown (3 of 9)]
[im 1/72]
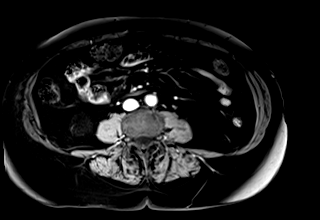
[im 36/72]
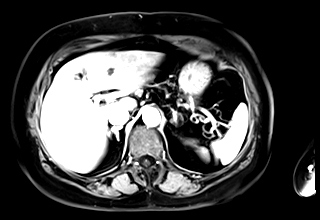
[im 72/72]
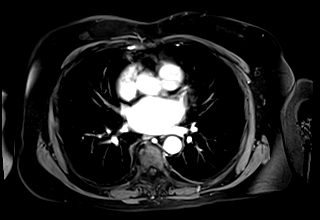

[Series 15: T1 dynamic post-contrast · axial · 3.0mm · 1.25mm/px · z∈[-114,+99]mm · 3 of 72 slices shown (4 of 9)]
[im 1/72]
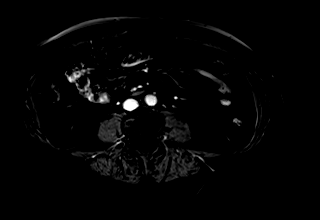
[im 36/72]
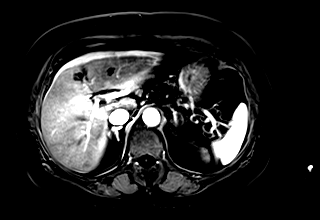
[im 72/72]
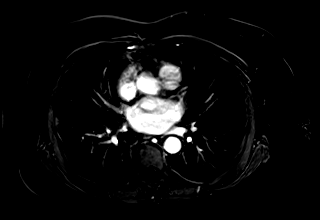

[Series 16: T1 dynamic post-contrast · axial · 3.0mm · 1.25mm/px · z∈[-114,+99]mm · 3 of 72 slices shown (5 of 9)]
[im 1/72]
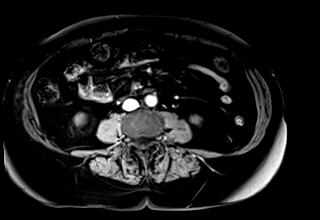
[im 36/72]
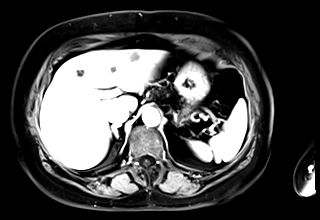
[im 72/72]
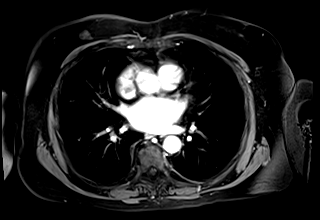

[Series 17: T1 dynamic post-contrast · axial · 3.0mm · 1.25mm/px · z∈[-114,+99]mm · 3 of 72 slices shown (6 of 9)]
[im 1/72]
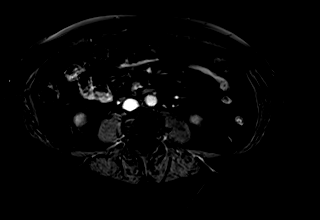
[im 36/72]
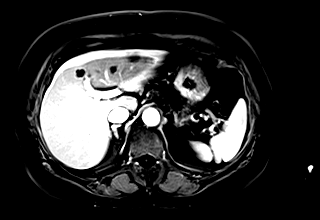
[im 72/72]
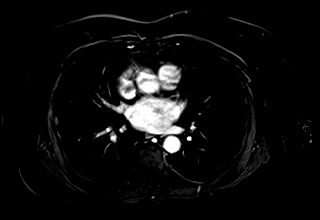

[Series 18: T1 dynamic post-contrast · coronal · 3.0mm · 1.25mm/px · 3 of 72 slices shown (7 of 9)]
[im 1/72]
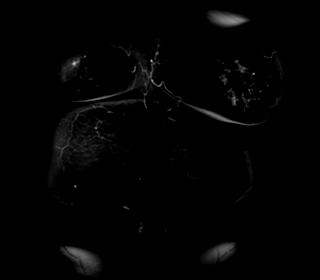
[im 36/72]
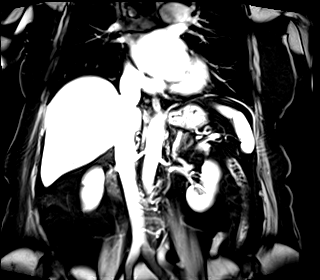
[im 72/72]
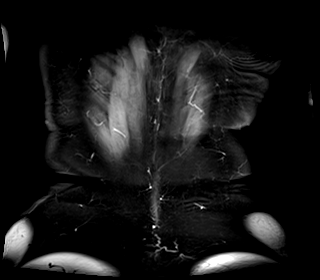

[Series 19: T1 dynamic post-contrast · axial · 3.0mm · 1.25mm/px · z∈[-114,+99]mm · 3 of 72 slices shown (8 of 9)]
[im 1/72]
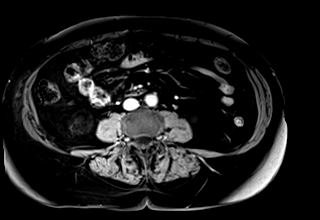
[im 36/72]
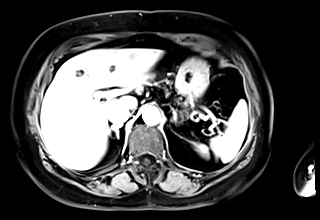
[im 72/72]
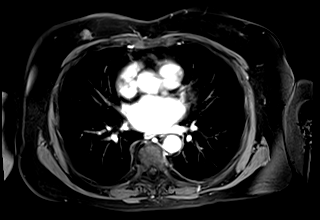

[Series 20: T1 dynamic post-contrast · axial · 3.0mm · 1.25mm/px · z∈[-114,+99]mm · 3 of 72 slices shown (9 of 9)]
[im 1/72]
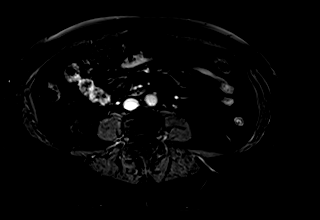
[im 36/72]
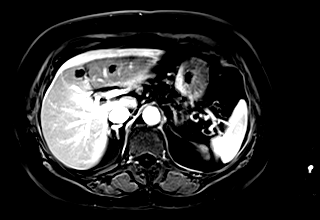
[im 72/72]
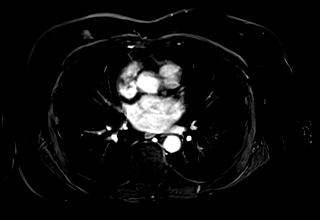

[48 of 48 positions shown; findings below may reference images not displayed]

FINDINGS: Lower chest: No acute findings.

Hepatobiliary: Exam detail limited by motion artifact.

Diffuse hepatic steatosis. Multiple T2 hyperintense and T1
hypointense liver lesions are identified predominantly involving the
left hepatic lobe.

-Dominant lesion is in the lateral segment of left hepatic lobe
measuring 2.4 cm, image 32/14. Unchanged from 04/18/2020. There is
no internal enhancement associated with this structure compatible
with a benign cyst.

-Within segment 4a there is a septated cyst measuring 1.3 cm, image
37/16. This is unchanged from the previous exam.

-additional T2 hyperintense liver lesions appear unchanged from
04/18/2020 favoring a benign process.

Sludge noted within the dependent portion of the gallbladder with
possible tiny stones noted dependently. No gallbladder wall
thickening or pericholecystic inflammation. No intrahepatic bile
duct dilatation. The CBD is normal in caliber.

Pancreas: No mass, inflammatory changes, or other parenchymal
abnormality identified.

Spleen:  Within normal limits in size and appearance.

Adrenals/Urinary Tract: No masses identified. No evidence of
hydronephrosis.

Stomach/Bowel: Visualized portions within the abdomen are
unremarkable.

Vascular/Lymphatic: Aortic atherosclerosis. No aneurysm. The upper
abdominal vasculature appears patent. No adenopathy.

Other:  No free fluid or fluid collections.

Musculoskeletal: No suspicious bone lesions identified. Right breast
lesion containing biopsy clip is noted measuring 1.4 cm, image [DATE].
IMPRESSION: 1. No acute findings within the abdomen.
2. Multiple benign liver lesions are identified, with imaging
characteristics compatible with benign cysts.
3. Sludge noted within the gallbladder with possible tiny stones
noted dependently. No gallbladder wall thickening or pericholecystic
inflammation.
4. No suspicious enhancing liver lesions identified at this time.
5.  Aortic Atherosclerosis ([id number]).

## 2022-07-23 ENCOUNTER — Other Ambulatory Visit: Payer: Medicare HMO

## 2022-07-23 DIAGNOSIS — I480 Paroxysmal atrial fibrillation: Secondary | ICD-10-CM | POA: Diagnosis not present

## 2022-07-23 DIAGNOSIS — E785 Hyperlipidemia, unspecified: Secondary | ICD-10-CM

## 2022-07-23 DIAGNOSIS — I10 Essential (primary) hypertension: Secondary | ICD-10-CM

## 2022-07-23 DIAGNOSIS — E7800 Pure hypercholesterolemia, unspecified: Secondary | ICD-10-CM

## 2022-07-24 ENCOUNTER — Ambulatory Visit
Admission: RE | Admit: 2022-07-24 | Discharge: 2022-07-24 | Disposition: A | Discharge status: Home / Self Care | Payer: Medicare HMO | Source: Ambulatory Visit | Attending: Family Medicine | Admitting: Family Medicine

## 2022-07-24 DIAGNOSIS — Z1231 Encounter for screening mammogram for malignant neoplasm of breast: Secondary | ICD-10-CM | POA: Diagnosis not present

## 2022-07-24 LAB — LIPID PANEL
Cholesterol: 143 mg/dL
HDL: 42 mg/dL — ABNORMAL LOW
LDL Cholesterol (Calc): 79 mg/dL
Non-HDL Cholesterol (Calc): 101 mg/dL
Total CHOL/HDL Ratio: 3.4 (calc)
Triglycerides: 121 mg/dL

## 2022-07-24 LAB — COMPREHENSIVE METABOLIC PANEL WITH GFR
AG Ratio: 1.3 (calc) (ref 1.0–2.5)
ALT: 11 U/L (ref 6–29)
AST: 15 U/L (ref 10–35)
Albumin: 4.1 g/dL (ref 3.6–5.1)
Alkaline phosphatase (APISO): 72 U/L (ref 37–153)
BUN: 13 mg/dL (ref 7–25)
CO2: 30 mmol/L (ref 20–32)
Calcium: 9.1 mg/dL (ref 8.6–10.4)
Chloride: 107 mmol/L (ref 98–110)
Creat: 0.96 mg/dL (ref 0.60–1.00)
Globulin: 3.1 g/dL (ref 1.9–3.7)
Glucose, Bld: 95 mg/dL (ref 65–99)
Potassium: 4.5 mmol/L (ref 3.5–5.3)
Sodium: 144 mmol/L (ref 135–146)
Total Bilirubin: 0.6 mg/dL (ref 0.2–1.2)
Total Protein: 7.2 g/dL (ref 6.1–8.1)

## 2022-07-24 LAB — CBC WITH DIFFERENTIAL/PLATELET
Absolute Monocytes: 418 {cells}/uL (ref 200–950)
Basophils Absolute: 19 {cells}/uL (ref 0–200)
Basophils Relative: 0.4 %
Eosinophils Absolute: 52 {cells}/uL (ref 15–500)
Eosinophils Relative: 1.1 %
HCT: 40 % (ref 35.0–45.0)
Hemoglobin: 13.2 g/dL (ref 11.7–15.5)
Lymphs Abs: 1340 {cells}/uL (ref 850–3900)
MCH: 29 pg (ref 27.0–33.0)
MCHC: 33 g/dL (ref 32.0–36.0)
MCV: 87.9 fL (ref 80.0–100.0)
MPV: 11.3 fL (ref 7.5–12.5)
Monocytes Relative: 8.9 %
Neutro Abs: 2872 {cells}/uL (ref 1500–7800)
Neutrophils Relative %: 61.1 %
Platelets: 194 10*3/uL (ref 140–400)
RBC: 4.55 Million/uL (ref 3.80–5.10)
RDW: 13.1 % (ref 11.0–15.0)
Total Lymphocyte: 28.5 %
WBC: 4.7 10*3/uL (ref 3.8–10.8)

## 2022-07-28 ENCOUNTER — Ambulatory Visit (INDEPENDENT_AMBULATORY_CARE_PROVIDER_SITE_OTHER): Payer: Medicare HMO | Admitting: Family Medicine

## 2022-07-28 VITALS — BP 126/72 | HR 62 | Ht 66.0 in | Wt 178.2 lb

## 2022-07-28 DIAGNOSIS — E785 Hyperlipidemia, unspecified: Secondary | ICD-10-CM | POA: Diagnosis not present

## 2022-07-28 DIAGNOSIS — I10 Essential (primary) hypertension: Secondary | ICD-10-CM

## 2022-07-28 DIAGNOSIS — M8580 Other specified disorders of bone density and structure, unspecified site: Secondary | ICD-10-CM | POA: Diagnosis not present

## 2022-07-28 DIAGNOSIS — Z853 Personal history of malignant neoplasm of breast: Secondary | ICD-10-CM | POA: Diagnosis not present

## 2022-07-28 DIAGNOSIS — Z23 Encounter for immunization: Secondary | ICD-10-CM

## 2022-07-28 DIAGNOSIS — I480 Paroxysmal atrial fibrillation: Secondary | ICD-10-CM | POA: Diagnosis not present

## 2022-07-28 DIAGNOSIS — Z0000 Encounter for general adult medical examination without abnormal findings: Secondary | ICD-10-CM | POA: Diagnosis not present

## 2022-07-28 NOTE — Progress Notes (Signed)
Subjective:    Patient ID: Alexandra Calderon, female    DOB: 07-Sep-1941, 81 y.o.   MRN: [id number]  HPI patient is a 81 year old Caucasian female here today for complete physical exam.  She is due for a flu shot.  She is due for COVID booster.  She is due for Shingrix.  She is due for tetanus shot.  She is due for RSV.  She had a mammogram recently that was normal.  She is overdue for a bone density test.  She is on about the fourth year of Fosamax.  She needs 1 additional year before she can temporarily discontinue that however I would like to repeat a bone density test first.  Her colonoscopy was performed last year and I recommended against any further routine colonoscopies unless the patient is symptomatic.  She does not require Pap smear due to age.  Otherwise she is doing well.  Her most recent lab work is listed below Immunization History  Administered Date(s) Administered   DTP 05/24/2007   Fluad Quad(high Dose 65+) 06/21/2019, 07/18/2020, 07/25/2021, 07/28/2022   Influenza Split 06/29/2012   Influenza Whole 10/14/1999, 07/29/2007, 07/28/2008, 07/05/2009, 07/26/2010   Influenza, High Dose Seasonal PF 07/25/2014, 08/02/2015, 06/25/2016, 06/16/2017   Influenza,inj,Quad PF,6+ Mos 07/05/2013, 06/21/2018   PFIZER(Purple Top)SARS-COV-2 Vaccination 10/25/2019, 11/14/2019, 08/02/2020   Pneumococcal Conjugate-13 08/15/2013   Pneumococcal Polysaccharide-23 10/13/2002, 04/19/2015   Td 10/13/2006, 05/24/2007   Zoster, Live 08/05/2016   Most recent lab work is listed below: Lab on 07/23/2022  Component Date Value Ref Range Status   WBC 07/23/2022 4.7  3.8 - 10.8 Thousand/uL Final   RBC 07/23/2022 4.55  3.80 - 5.10 Million/uL Final   Hemoglobin 07/23/2022 13.2  11.7 - 15.5 g/dL Final   HCT 07/23/2022 40.0  35.0 - 45.0 % Final   MCV 07/23/2022 87.9  80.0 - 100.0 fL Final   MCH 07/23/2022 29.0  27.0 - 33.0 pg Final   MCHC 07/23/2022 33.0  32.0 - 36.0 g/dL Final   RDW 07/23/2022 13.1  11.0 - 15.0  % Final   Platelets 07/23/2022 194  140 - 400 Thousand/uL Final   MPV 07/23/2022 11.3  7.5 - 12.5 fL Final   Neutro Abs 07/23/2022 2,872  1,500 - 7,800 cells/uL Final   Lymphs Abs 07/23/2022 1,340  850 - 3,900 cells/uL Final   Absolute Monocytes 07/23/2022 418  200 - 950 cells/uL Final   Eosinophils Absolute 07/23/2022 52  15 - 500 cells/uL Final   Basophils Absolute 07/23/2022 19  0 - 200 cells/uL Final   Neutrophils Relative % 07/23/2022 61.1  % Final   Total Lymphocyte 07/23/2022 28.5  % Final   Monocytes Relative 07/23/2022 8.9  % Final   Eosinophils Relative 07/23/2022 1.1  % Final   Basophils Relative 07/23/2022 0.4  % Final   Glucose, Bld 07/23/2022 95  65 - 99 mg/dL Final   Comment: .            Fasting reference interval .    BUN 07/23/2022 13  7 - 25 mg/dL Final   Creat 07/23/2022 0.96  0.60 - 1.00 mg/dL Final   BUN/Creatinine Ratio 07/23/2022 SEE NOTE:  6 - 22 (calc) Final   Comment:    Not Reported: BUN and Creatinine are within    reference range. .    Sodium 07/23/2022 144  135 - 146 mmol/L Final   Potassium 07/23/2022 4.5  3.5 - 5.3 mmol/L Final   Chloride 07/23/2022 107  98 -  110 mmol/L Final   CO2 07/23/2022 30  20 - 32 mmol/L Final   Calcium 07/23/2022 9.1  8.6 - 10.4 mg/dL Final   Total Protein 07/23/2022 7.2  6.1 - 8.1 g/dL Final   Albumin 07/23/2022 4.1  3.6 - 5.1 g/dL Final   Globulin 07/23/2022 3.1  1.9 - 3.7 g/dL (calc) Final   AG Ratio 07/23/2022 1.3  1.0 - 2.5 (calc) Final   Total Bilirubin 07/23/2022 0.6  0.2 - 1.2 mg/dL Final   Alkaline phosphatase (APISO) 07/23/2022 72  37 - 153 U/L Final   AST 07/23/2022 15  10 - 35 U/L Final   ALT 07/23/2022 11  6 - 29 U/L Final   Cholesterol 07/23/2022 143  <200 mg/dL Final   HDL 07/23/2022 42 (L)  > OR = 50 mg/dL Final   Triglycerides 07/23/2022 121  <150 mg/dL Final   LDL Cholesterol (Calc) 07/23/2022 79  mg/dL (calc) Final   Comment: Reference range: <100 . Desirable range <100 mg/dL for primary  prevention;   <70 mg/dL for patients with CHD or diabetic patients  with > or = 2 CHD risk factors. Marland Kitchen LDL-C is now calculated using the Martin-Hopkins  calculation, which is a validated novel method providing  better accuracy than the Friedewald equation in the  estimation of LDL-C.  Cresenciano Genre et al. Annamaria Helling. 2951;884(16): 2061-2068  (http://education.[employer].com/faq/FAQ164)    Total CHOL/HDL Ratio 07/23/2022 3.4  <5.0 (calc) Final   Non-HDL Cholesterol (Calc) 07/23/2022 101  <130 mg/dL (calc) Final   Comment: For patients with diabetes plus 1 major ASCVD risk  factor, treating to a non-HDL-C goal of <100 mg/dL  (LDL-C of <70 mg/dL) is considered a therapeutic  option.     Past Medical History:  Diagnosis Date   Allergy    Arthritis    Breast cancer ([place])    right   Cataract    bilateral   Colon adenomas 2003,2006   Constipation    Diverticulosis    Dysrhythmia    Esophagitis    Fatty liver    GERD (gastroesophageal reflux disease)    Hearing loss    History of kidney stones    Hyperlipidemia    Hypertension    Irritable bowel syndrome 08/27/2012   Kidney stones    Osteopenia    Palpitations    Personal history of radiation therapy    Pneumonia    Polyp, stomach    PVC (premature ventricular contraction)    PVC (premature ventricular contraction)    UTI (lower urinary tract infection)    Past Surgical History:  Procedure Laterality Date   BREAST LUMPECTOMY Right 07/01/11   w/sentinal node bx - right side   COLONOSCOPY W/ BIOPSIES     DILATION AND CURETTAGE OF UTERUS     ESOPHAGOGASTRODUODENOSCOPY     kidney stones     LAPAROSCOPIC CHOLECYSTECTOMY SINGLE SITE WITH INTRAOPERATIVE CHOLANGIOGRAM N/A 04/25/2021   Procedure: LAPAROSCOPIC CHOLECYSTECTOMY SINGLE SITE WITH INTRAOPERATIVE CHOLANGIOGRAM, POSSIBLE NEEDLE CORE LIVER BX;  Surgeon: [name], MD;  Location: [facility];  Service: General;  Laterality: N/A;   shoulder arthroscopy rt 2009     SHOULDER  SURGERY      Current Outpatient Medications on File Prior to Visit  Medication Sig Dispense Refill   alendronate (FOSAMAX) 70 MG tablet TAKE 1 TABLET BY MOUTH EVERY 7 DAYS WITH A FULL GLASS OF WATER ON AN EMPTY STOMACH. 12 tablet 3   apixaban (ELIQUIS) 5 MG TABS tablet Take 1 tablet (5  mg total) by mouth 2 (two) times daily. 56 tablet 0   atenolol (TENORMIN) 50 MG tablet TAKE 1 TABLET BY MOUTH TWICE A DAY 180 tablet 3   cephALEXin (KEFLEX) 500 MG capsule Take 1 capsule (500 mg total) by mouth once as needed for up to 1 dose (after intercourse). 30 capsule 0   Cholecalciferol (VITAMIN D) 50 MCG (2000 UT) CAPS Take 2,000 Units by mouth daily.     diltiazem (CARDIZEM) 30 MG tablet Take 1 tablet every 4 hours AS NEEDED for heart rate >100 30 tablet 1   Docusate Sodium (DSS) 100 MG CAPS Take by mouth daily.     losartan (COZAAR) 25 MG tablet TAKE 1 TABLET (25 MG TOTAL) BY MOUTH DAILY. PLEASE KEEP UPCOMING APPT IN NOVEMBER 2022 WITH DR. Acie Fredrickson BEFORE ANYMORE REFILLS. THANK YOU 90 tablet 3   omeprazole (PRILOSEC) 40 MG capsule TAKE 1 CAPSULE BY MOUTH EVERY DAY 90 capsule 3   rosuvastatin (CRESTOR) 5 MG tablet TAKE 1 TABLET BY MOUTH EVERY DAY 90 tablet 2   Current Facility-Administered Medications on File Prior to Visit  Medication Dose Route Frequency Provider Last Rate Last Admin   0.9 %  sodium chloride infusion  500 mL Intravenous Once Gatha Mayer, MD       Allergies  Allergen Reactions   Gatifloxacin     unknown   Nitrofurantoin Macrocrystal     unknown   Azithromycin Rash   Levofloxacin Rash    REACTION: rash    Sulfamethoxazole-Trimethoprim Rash and Other (See Comments)    Chills, fever   Sulfonamide Derivatives Rash and Other (See Comments)    Chills, fever   Social History   Socioeconomic History   Marital status: Married    Spouse name: Not on file   Number of children: 2   Years of education: Not on file   Highest education level: Not on file  Occupational History    Occupation: retired  Tobacco Use   Smoking status: Never   Smokeless tobacco: Never   Tobacco comments:    Never smoke 12/25/21  Vaping Use   Vaping Use: Never used  Substance and Sexual Activity   Alcohol use: No   Drug use: No   Sexual activity: Yes  Other Topics Concern   Not on file  Social History Narrative   Spouse:  [name]   Children:   [name]- age 16- [place]   [name]- age 23- [place]   Social Determinants of Health   Financial Resource Strain: Low Risk  (06/05/2022)   Overall Financial Resource Strain (CARDIA)    Difficulty of Paying Living Expenses: Not hard at all  Food Insecurity: Not on file  Transportation Needs: Not on file  Physical Activity: Not on file  Stress: Not on file  Social Connections: Not on file  Intimate Partner Violence: Not on file   Family History  Problem Relation Age of Onset   Lymphoma Father    Hodgkin's lymphoma Father    Colon cancer Mother        early 68's   Diabetes Maternal Aunt    Diabetes Paternal Grandmother    Colon polyps Neg Hx    Rectal cancer Neg Hx    Stomach cancer Neg Hx    Pancreatic cancer Neg Hx    Esophageal cancer Neg Hx      Review of Systems  All other systems reviewed and are negative.      Objective:   Physical  Exam Vitals reviewed.  Constitutional:      General: She is not in acute distress.    Appearance: She is well-developed. She is not diaphoretic.  HENT:     Head: Normocephalic and atraumatic.     Right Ear: External ear normal.     Left Ear: External ear normal.     Nose: Nose normal.  Eyes:     General: No scleral icterus.       Right eye: No discharge.        Left eye: No discharge.     Conjunctiva/sclera: Conjunctivae normal.     Pupils: Pupils are equal, round, and reactive to light.  Neck:     Thyroid: No thyromegaly.     Vascular: No JVD.     Trachea: No tracheal deviation.  Cardiovascular:     Rate and Rhythm: Normal rate and regular rhythm.      Heart sounds: Normal heart sounds. No murmur heard.    No friction rub. No gallop.  Pulmonary:     Effort: Pulmonary effort is normal. No respiratory distress.     Breath sounds: Normal breath sounds. No stridor. No wheezing or rales.  Chest:     Chest wall: No tenderness.  Abdominal:     General: Bowel sounds are normal. There is no distension.     Palpations: Abdomen is soft. There is no mass.     Tenderness: There is no abdominal tenderness. There is no guarding or rebound.  Musculoskeletal:        General: No tenderness or deformity. Normal range of motion.     Cervical back: Normal range of motion and neck supple.  Lymphadenopathy:     Cervical: No cervical adenopathy.  Skin:    General: Skin is warm.     Coloration: Skin is not pale.     Findings: No erythema or rash.  Neurological:     Mental Status: She is alert and oriented to person, place, and time.     Cranial Nerves: No cranial nerve deficit.     Motor: No abnormal muscle tone.     Coordination: Coordination normal.     Deep Tendon Reflexes: Reflexes are normal and symmetric.  Psychiatric:        Behavior: Behavior normal.        Thought Content: Thought content normal.        Judgment: Judgment normal.           Assessment & Plan:  Osteopenia, unspecified location - Plan: DG Bone Density  Essential hypertension - Plan: Flu Vaccine QUAD High Dose(Fluad)  Flu vaccine need - Plan: Flu Vaccine QUAD High Dose(Fluad)  Medicare annual wellness visit, subsequent  Dyslipidemia  Paroxysmal atrial fibrillation ([place])  Personal history of breast cancer Mammogram is up-to-date.  Colonoscopy is up-to-date.  I will schedule the patient for a bone density test.  Patient received her flu shot today.  Recommended a COVID booster, Shingrix, we discussed a tetanus shot.  We discussed RSV.  If the bone density test is stable, and in 1 year we will discontinue Fosamax for therapy vacation although continue calcium and  vitamin D.  Lab work is outstanding.  Patient denies any falls, depression, or memory loss

## 2022-08-02 ENCOUNTER — Other Ambulatory Visit: Payer: Self-pay | Admitting: Cardiovascular Disease

## 2022-08-02 ENCOUNTER — Other Ambulatory Visit: Payer: Self-pay | Admitting: Family Medicine

## 2022-08-04 ENCOUNTER — Other Ambulatory Visit: Payer: Self-pay

## 2022-08-04 NOTE — Telephone Encounter (Signed)
Received eFax from pharmacy to request refill of or new script for  alendronate (FOSAMAX) 70 MG tablet [[id number]]    Order Details Dose, Route, Frequency: As Directed  Dispense Quantity: 12 tablet Refills: 3        Sig: TAKE 1 TABLET BY MOUTH EVERY 7 DAYS WITH A FULL GLASS OF WATER ON AN EMPTY STOMACH.       Start Date: 06/24/21 End Date: --  Written Date: 06/24/21 Expiration Date: 06/24/22  Original Order:  alendronate (FOSAMAX) 70 MG tablet [[id number]]  .  Pharmacy: [id number] [place], [place] .  LOV: 07/28/22

## 2022-08-05 NOTE — Telephone Encounter (Signed)
Requested medications are due for refill today.  yes  Requested medications are on the active medications list.  yes  Last refill. 06/24/2022 #12 3 rf  Future visit scheduled.   no  Notes to clinic.  Missing expired labs.    Requested Prescriptions  Pending Prescriptions Disp Refills   alendronate (FOSAMAX) 70 MG tablet 12 tablet 3    Sig: Take with a full glass of water on an empty stomach.     Endocrinology:  Bisphosphonates Failed - 08/04/2022  3:38 PM      Failed - Vitamin D in normal range and within 360 days    Vit D, 25-Hydroxy  Date Value Ref Range Status  10/19/2013 62 30 - 89 ng/mL Final    Comment:    This assay accurately quantifies Vitamin D, which is the sum of the25-Hydroxy forms of Vitamin D2 and D3.  Studies have shown that theoptimum concentration of 25-Hydroxy Vitamin D is 30 ng/mL or higher. Concentrations of Vitamin D between 20 and 29 ng/mL  are considered tobe insufficient and concentrations less than 20 ng/mL are consideredto be deficient for Vitamin D.         Failed - Mg Level in normal range and within 360 days    Magnesium  Date Value Ref Range Status  05/18/2021 2.0 1.7 - 2.4 mg/dL Final    Comment:    Performed at [facility], [place] [address] [place], [place] [address]         Failed - Phosphate in normal range and within 360 days    No results found for: "PHOS"       Failed - eGFR is 30 or above and within 360 days    GFR, Est African American  Date Value Ref Range Status  03/14/2021 66 > OR = 60 mL/min/1.47m Final   GFR, Est Non African American  Date Value Ref Range Status  03/14/2021 57 (L) > OR = 60 mL/min/1.746mFinal   GFR, Estimated  Date Value Ref Range Status  05/18/2021 47 (L) >60 mL/min Final    Comment:    (NOTE) Calculated using the CKD-EPI Creatinine Equation (2021)    GFR  Date Value Ref Range Status  04/29/2017 63.38 >60.00 mL/min Final   eGFR  Date Value Ref Range Status  07/17/2021 57 (L) > OR = 60  mL/min/1.7338minal    Comment:    The eGFR is based on the CKD-EPI 2021 equation. To calculate  the new eGFR from a previous Creatinine or Cystatin C result, go to https://www.kidney.org/professionals/ kdoqi/gfr%5Fcalculator          Failed - Bone Mineral Density or Dexa Scan completed in the last 2 years      Passed - Ca in normal range and within 360 days    Calcium  Date Value Ref Range Status  07/23/2022 9.1 8.6 - 10.4 mg/dL Final  02/12/2016 9.3 8.4 - 10.4 mg/dL Final   Calcium, Ion  Date Value Ref Range Status  03/06/2014 1.20 1.13 - 1.30 mmol/L Final         Passed - Cr in normal range and within 360 days    Creatinine  Date Value Ref Range Status  02/12/2016 1.0 0.6 - 1.1 mg/dL Final   Creat  Date Value Ref Range Status  07/23/2022 0.96 0.60 - 1.00 mg/dL Final         Passed - Valid encounter within last 12 months    Recent Outpatient Visits  6 months ago Chronic sore throat   [place] Pickard, Cammie Mcgee, MD   10 months ago [place] Denn[facility] Schaumann, Cammie Mcgee, MD   1 year ago Need for immunization against influenza   [place] Pickard, Cammie Mcgee, MD   1 year ago [place] Denn[facility] Schaumann Cammie Mcgee, MD   1 year ago [place], NP       Future Appointments             In 2 months Nahser, Wonda Cheng, MD [facility] [place], [facility]

## 2022-08-06 NOTE — Telephone Encounter (Signed)
Received eFax from pharmacy to request refill of   alendronate (FOSAMAX) 70 MG tablet   Pharmacy:   [id number] [place] [place] [address] [place] [place] Phone:  [phone] Fax:  [phone] DEA #:  [id number] LOV: CPE on 07/28/2022  Please advise pharmacist at [phone]

## 2022-08-07 MED ORDER — ALENDRONATE SODIUM 70 MG PO TABS: ORAL_TABLET | ORAL | 3 refills | Status: DC

## 2022-08-07 NOTE — Telephone Encounter (Signed)
As advised by pharmacist, patient called to follow up on refill request for alendronate (FOSAMAX) 70 MG tablet   Patient stated she has 3 pills left.  Please advise patient at [phone].

## 2022-08-11 DIAGNOSIS — Z87440 Personal history of urinary (tract) infections: Secondary | ICD-10-CM | POA: Diagnosis not present

## 2022-10-21 ENCOUNTER — Encounter: Payer: Self-pay | Admitting: Cardiovascular Disease

## 2022-10-21 NOTE — Progress Notes (Unsigned)
Cardiology Office Note   Date:  10/21/2022   ID:  Alexandra Calderon, Alexandra Calderon [birth year], MRN [id number]  PCP:  Susy Frizzle, MD  Cardiologist:   Mertie Moores, MD   Chief Complaint  Patient presents with   Atrial Fibrillation   Hypertension        Problem List 1. Essential HTN 2. Palpitations - PVCs  3. Hyperlipidemia  4. Irritable bowel syndrome     Previous notes.   Alexandra Calderon is a 82 y.o. female who is being seen today for the evaluation of palpitations  at the request of Susy Frizzle, MD.  Seen with her husband   Alexandra Calderon  Has had palpitations all of her life.    Over the years, they have been stable for years.  Dr. Raliegh Ip increased her atenolol which seemed to have helped.   Has rare , isolated "thumps"  And then normal HR for a while These episodes may go on for several days Seem to be worse after eating , in the evening .  Do not limit her activity .  Not associated with weakness or dizziness, no dyspnea.    Does not get any regular exercise - tries to walk some   Aug. 20, 2018  Alexandra Calderon is here for follow up of her HTN and PVCs  Heart is now "quiet." Exercising some ,  No CP or dyspnea.   Aug. 27, 2019:  Feeling better.   Not as many PVCs now  Takes atenolol 50 mg BID  Discussed weight loss   Sept. 14, 2020  Alexandra Calderon is seen for further evaluation of her palpitations No further palpitations. Walks on occasion.   Gets DOE walking up a hill.  Likely due to deconditioning .   Has several questions today .  Was seen by her primary for palpitations.   Dr. Dennard Schaumann gave her Diltiazem 30 mg to take as needed.   She is still on atenolol 50 mg BID     June 26, 2020: Alexandra Calderon is seen today for follow-up visit.  She has a history of palpitations.  She also has a history of hypertension.  Walks on occasion  No CP , Has occasional palpitations  BP is normal at home  Hx of hyperlipidemia  Not fast foods for several months ,  Wt today is 192 lbs  ( down 4  lbs from last year )  She had an abdominal CT scan which incidentally found some coronary artery calcifications.  August 19, 2021: Alexandra Calderon is seen today for a new Dx of Atrial fib and for  for follow-up of her coronary artery calcifications.  She has a history of palpitations.  Dx with atrial Fib / Atrial flutter on Aug. 6 .  Was having palpitations Apple watch said she had atrial fib   She converted back to NSR after IV Diltiazem  CHADS2VASC is 5 .   Had some liver enz elevations.   Dr. Dennard Schaumann had her stop her statin   Has had a sleep study.   Does not have OAS .  Has Diltiazem 30 mg QID to take PRN atrial fib   Echo shows normal LV systolic function  Moderate pulmonary HTN  50 mmHg .  Is on aldactone  Has lost 15 lbs    Jan. 10, 2024 Alexandra Calderon is seen for further eval of her HTN, Afib, pulmonary HTN CHADS2VASC is 5 Is on aldactone for pulmonary HTN   Past Medical History:  Diagnosis Date  Allergy    Arthritis    Breast cancer ([place])    right   Cataract    bilateral   Colon adenomas 2003,2006   Constipation    Diverticulosis    Dysrhythmia    Esophagitis    Fatty liver    GERD (gastroesophageal reflux disease)    Hearing loss    History of kidney stones    Hyperlipidemia    Hypertension    Irritable bowel syndrome 08/27/2012   Kidney stones    Osteopenia    Palpitations    Personal history of radiation therapy    Pneumonia    Polyp, stomach    PVC (premature ventricular contraction)    PVC (premature ventricular contraction)    UTI (lower urinary tract infection)     Past Surgical History:  Procedure Laterality Date   BREAST LUMPECTOMY Right 07/01/11   w/sentinal node bx - right side   COLONOSCOPY W/ BIOPSIES     DILATION AND CURETTAGE OF UTERUS     ESOPHAGOGASTRODUODENOSCOPY     kidney stones     LAPAROSCOPIC CHOLECYSTECTOMY SINGLE SITE WITH INTRAOPERATIVE CHOLANGIOGRAM N/A 04/25/2021   Procedure: LAPAROSCOPIC CHOLECYSTECTOMY SINGLE SITE WITH  INTRAOPERATIVE CHOLANGIOGRAM, POSSIBLE NEEDLE CORE LIVER BX;  Surgeon: [name], MD;  Location: [facility];  Service: General;  Laterality: N/A;   shoulder arthroscopy rt 2009     SHOULDER SURGERY       Current Outpatient Medications  Medication Sig Dispense Refill   alendronate (FOSAMAX) 70 MG tablet TAKE 1 TABLET BY MOUTH EVERY 7 DAYS WITH A FULL GLASS OF WATER ON AN EMPTY STOMACH. 12 tablet 3   apixaban (ELIQUIS) 5 MG TABS tablet Take 1 tablet (5 mg total) by mouth 2 (two) times daily. 56 tablet 0   atenolol (TENORMIN) 50 MG tablet TAKE 1 TABLET BY MOUTH TWICE A DAY 180 tablet 3   cephALEXin (KEFLEX) 500 MG capsule Take 1 capsule (500 mg total) by mouth once as needed for up to 1 dose (after intercourse). 30 capsule 0   Cholecalciferol (VITAMIN D) 50 MCG (2000 UT) CAPS Take 2,000 Units by mouth daily.     diltiazem (CARDIZEM) 30 MG tablet Take 1 tablet every 4 hours AS NEEDED for heart rate >100 30 tablet 1   Docusate Sodium (DSS) 100 MG CAPS Take by mouth daily.     losartan (COZAAR) 25 MG tablet TAKE 1 TABLET BY MOUTH EVERY DAY 90 tablet 2   omeprazole (PRILOSEC) 40 MG capsule TAKE 1 CAPSULE BY MOUTH EVERY DAY 90 capsule 3   rosuvastatin (CRESTOR) 5 MG tablet TAKE 1 TABLET BY MOUTH EVERY DAY 90 tablet 2   Current Facility-Administered Medications  Medication Dose Route Frequency Provider Last Rate Last Admin   0.9 %  sodium chloride infusion  500 mL Intravenous Once Gatha Mayer, MD           02/10/2017    8:50 AM  PAD Screen  Previous PAD dx? No  Previous surgical procedure? No  Pain with walking? No  Feet/toe relief with dangling? No  Painful, non-healing ulcers? No  Extremities discolored? No      Allergies:   Gatifloxacin, Nitrofurantoin macrocrystal, Azithromycin, Levofloxacin, Sulfamethoxazole-trimethoprim, and Sulfonamide derivatives    Social History:  The patient  reports that she has never smoked. She has never used smokeless tobacco. She reports that she  does not drink alcohol and does not use drugs.   Family History:  The patient's family history includes Colon cancer in  her mother; Diabetes in her maternal aunt and paternal grandmother; Hodgkin's lymphoma in her father; Lymphoma in her father.    ROS:     Noted in current hx , otherwise negative    Physical Exam: There were no vitals taken for this visit.  No BP recorded.  {Refresh Note OR Click here to enter BP  :1}***    GEN:  Well nourished, well developed in no acute distress HEENT: Normal NECK: No JVD; No carotid bruits LYMPHATICS: No lymphadenopathy CARDIAC: RRR ***, no murmurs, rubs, gallops RESPIRATORY:  Clear to auscultation without rales, wheezing or rhonchi  ABDOMEN: Soft, non-tender, non-distended MUSCULOSKELETAL:  No edema; No deformity  SKIN: Warm and dry NEUROLOGIC:  Alert and oriented x 3     EKG:       Recent Labs: 07/23/2022: ALT 11; BUN 13; Creat 0.96; Hemoglobin 13.2; Platelets 194; Potassium 4.5; Sodium 144    Lipid Panel    Component Value Date/Time   CHOL 143 07/23/2022 0808   TRIG 121 07/23/2022 0808   HDL 42 (L) 07/23/2022 0808   CHOLHDL 3.4 07/23/2022 0808   VLDL 24.2 04/29/2017 0800   LDLCALC 79 07/23/2022 0808      Wt Readings from Last 3 Encounters:  07/28/22 178 lb 3.2 oz (80.8 kg)  04/22/22 176 lb (79.8 kg)  01/28/22 179 lb (81.2 kg)      Other studies Reviewed: Additional studies/ records that were reviewed today include: . Review of the above records demonstrates:    ASSESSMENT AND PLAN:  1.  Palpitations -       2. Hypertension:      3.  Hyperlipidemia:    4.  Coronary artery calcifications:  .    5.  Pulmonary hypertension:  .   Current medicines are reviewed at length with the patient today.  The patient does not have concerns regarding medicines.  Labs/ tests ordered today include:  No orders of the defined types were placed in this encounter.     Disposition:       Mertie Moores, MD  10/21/2022  6:36 AM    [address], [place], [place]  [address] Phone: [phone]; Fax: [phone]

## 2022-10-22 ENCOUNTER — Encounter: Payer: Self-pay | Admitting: Cardiovascular Disease

## 2022-10-22 ENCOUNTER — Ambulatory Visit: Payer: Medicare HMO | Attending: Cardiovascular Disease | Admitting: Cardiovascular Disease

## 2022-10-22 VITALS — BP 120/60 | HR 63 | Ht 66.0 in | Wt 183.2 lb

## 2022-10-22 DIAGNOSIS — I10 Essential (primary) hypertension: Secondary | ICD-10-CM | POA: Diagnosis not present

## 2022-10-22 DIAGNOSIS — I480 Paroxysmal atrial fibrillation: Secondary | ICD-10-CM | POA: Diagnosis not present

## 2022-10-22 MED ORDER — DILTIAZEM HCL 30 MG PO TABS: ORAL_TABLET | ORAL | 4 refills | Status: DC

## 2022-10-22 NOTE — Patient Instructions (Signed)
Medication Instructions:  Your physician recommends that you continue on your current medications as directed. Please refer to the Current Medication list given to you today.  *If you need a refill on your cardiac medications before your next appointment, please call your pharmacy*   Lab Work: NONE If you have labs (blood work) drawn today and your tests are completely normal, you will receive your results only by: MyChart Message (if you have MyChart) OR A paper copy in the mail If you have any lab test that is abnormal or we need to change your treatment, we will call you to review the results.   Testing/Procedures: NONE   Follow-Up: At [facility], you and your health needs are our priority.  As part of our continuing mission to provide you with exceptional heart care, we have created designated Provider Care Teams.  These Care Teams include your primary Cardiologist (physician) and Advanced Practice Providers (APPs -  Physician Assistants and Nurse Practitioners) who all work together to provide you with the care you need, when you need it.  We recommend signing up for the patient portal called "MyChart".  Sign up information is provided on this After Visit Summary.  MyChart is used to connect with patients for Virtual Visits (Telemedicine).  Patients are able to view lab/test results, encounter notes, upcoming appointments, etc.  Non-urgent messages can be sent to your provider as well.   To learn more about what you can do with MyChart, go to [email or web address].    Your next appointment:   1 year(s)  The format for your next appointment:   In Person  Provider:   Philip Nahser, MD       Important Information About Sugar       

## 2022-10-27 ENCOUNTER — Other Ambulatory Visit: Payer: Self-pay | Admitting: Cardiovascular Disease

## 2022-10-27 ENCOUNTER — Other Ambulatory Visit: Payer: Self-pay | Admitting: Family Medicine

## 2022-10-27 NOTE — Telephone Encounter (Signed)
Requested Prescriptions  Pending Prescriptions Disp Refills   omeprazole (PRILOSEC) 40 MG capsule [Pharmacy Med Name: OMEPRAZOLE DR 40 MG CAPSULE] 90 capsule 0    Sig: TAKE 1 CAPSULE BY MOUTH EVERY DAY     Gastroenterology: Proton Pump Inhibitors Passed - 10/27/2022  8:33 AM      Passed - Valid encounter within last 12 months    Recent Outpatient Visits           9 months ago Chronic sore throat   [place] Dennard Schaumann, Cammie Mcgee, MD   1 year ago [place] Susy Frizzle, MD   1 year ago Need for immunization against influenza   [place] Susy Frizzle, MD   1 year ago [place] Susy Frizzle, MD   1 year ago [place] [name], NP       Future Appointments             Tomorrow Pickard, Cammie Mcgee, MD [place], [place]

## 2022-10-28 ENCOUNTER — Ambulatory Visit (INDEPENDENT_AMBULATORY_CARE_PROVIDER_SITE_OTHER): Payer: Medicare HMO | Admitting: Family Medicine

## 2022-10-28 ENCOUNTER — Encounter: Payer: Self-pay | Admitting: Family Medicine

## 2022-10-28 VITALS — BP 130/82 | HR 64 | Ht 66.0 in | Wt 181.8 lb

## 2022-10-28 DIAGNOSIS — I272 Pulmonary hypertension, unspecified: Secondary | ICD-10-CM | POA: Diagnosis not present

## 2022-10-28 MED ORDER — CEPHALEXIN 500 MG PO CAPS: 500.0000 mg | ORAL_CAPSULE | Freq: Once | ORAL | 0 refills | Status: DC | PRN

## 2022-10-28 NOTE — Progress Notes (Signed)
Subjective:    Patient ID: Alexandra Calderon, female    DOB: [birth year], 82 y.o.   MRN: [id number]  HPI Patient is a very sweet 82 year old Caucasian female here today to discuss her echocardiogram.  In 2022 she had an echocardiogram that showed possible elevated pulmonary artery systolic pressure of 50.  In her recent consultation with her cardiologist he mention this to her and discussed possibly doing a right heart catheterization if she developed dyspnea on exertion.  The patient is here today simply to ask questions about what this means.  She denies any dyspnea on exertion.  She denies any chest pain.  She denies any orthopnea.  She denies any leg swelling or signs of right failure Past Medical History:  Diagnosis Date   Allergy    Arthritis    Breast cancer ([place])    right   Cataract    bilateral   Colon adenomas 2003,2006   Constipation    Diverticulosis    Dysrhythmia    Esophagitis    Fatty liver    GERD (gastroesophageal reflux disease)    Hearing loss    History of kidney stones    Hyperlipidemia    Hypertension    Irritable bowel syndrome 08/27/2012   Kidney stones    Osteopenia    Palpitations    Personal history of radiation therapy    Pneumonia    Polyp, stomach    PVC (premature ventricular contraction)    PVC (premature ventricular contraction)    UTI (lower urinary tract infection)    Past Surgical History:  Procedure Laterality Date   BREAST LUMPECTOMY Right 07/01/11   w/sentinal node bx - right side   COLONOSCOPY W/ BIOPSIES     DILATION AND CURETTAGE OF UTERUS     ESOPHAGOGASTRODUODENOSCOPY     kidney stones     LAPAROSCOPIC CHOLECYSTECTOMY SINGLE SITE WITH INTRAOPERATIVE CHOLANGIOGRAM N/A 04/25/2021   Procedure: LAPAROSCOPIC CHOLECYSTECTOMY SINGLE SITE WITH INTRAOPERATIVE CHOLANGIOGRAM, POSSIBLE NEEDLE CORE LIVER BX;  Surgeon: [name], MD;  Location: [facility];  Service: General;  Laterality: N/A;   shoulder arthroscopy rt 2009     SHOULDER SURGERY      Current Outpatient Medications on File Prior to Visit  Medication Sig Dispense Refill   alendronate (FOSAMAX) 70 MG tablet TAKE 1 TABLET BY MOUTH EVERY 7 DAYS WITH A FULL GLASS OF WATER ON AN EMPTY STOMACH. 12 tablet 3   apixaban (ELIQUIS) 5 MG TABS tablet Take 1 tablet (5 mg total) by mouth 2 (two) times daily. 56 tablet 0   atenolol (TENORMIN) 50 MG tablet TAKE 1 TABLET BY MOUTH TWICE A DAY 180 tablet 3   cephALEXin (KEFLEX) 500 MG capsule Take 1 capsule (500 mg total) by mouth once as needed for up to 1 dose (after intercourse). 30 capsule 0   Cholecalciferol (VITAMIN D) 50 MCG (2000 UT) CAPS Take 2,000 Units by mouth daily.     diltiazem (CARDIZEM) 30 MG tablet Take 1 tablet every 4 hours AS NEEDED for heart rate >100 30 tablet 4   Docusate Sodium (DSS) 100 MG CAPS Take by mouth daily.     losartan (COZAAR) 25 MG tablet TAKE 1 TABLET BY MOUTH EVERY DAY 90 tablet 2   omeprazole (PRILOSEC) 40 MG capsule TAKE 1 CAPSULE BY MOUTH EVERY DAY 90 capsule 0   rosuvastatin (CRESTOR) 5 MG tablet TAKE 1 TABLET BY MOUTH EVERY DAY 90 tablet 3   Current Facility-Administered Medications on File Prior to Visit  Medication Dose Route Frequency Provider Last Rate Last Admin   0.9 %  sodium chloride infusion  500 mL Intravenous Once Gatha Mayer, MD       Allergies  Allergen Reactions   Gatifloxacin     unknown   Nitrofurantoin Macrocrystal     unknown   Azithromycin Rash   Levofloxacin Rash    REACTION: rash    Sulfamethoxazole-Trimethoprim Rash and Other (See Comments)    Chills, fever   Sulfonamide Derivatives Rash and Other (See Comments)    Chills, fever   Social History   Socioeconomic History   Marital status: Married    Spouse name: Not on file   Number of children: 2   Years of education: Not on file   Highest education level: Not on file  Occupational History   Occupation: retired  Tobacco Use   Smoking status: Never   Smokeless tobacco: Never   Tobacco comments:     Never smoke 12/25/21  Vaping Use   Vaping Use: Never used  Substance and Sexual Activity   Alcohol use: No   Drug use: No   Sexual activity: Yes  Other Topics Concern   Not on file  Social History Narrative   Spouse:  [name]   Children:   [name]- age 41- [place]- age 63-    Social Determinants of Health   Financial Resource Strain: Low Risk  (06/05/2022)   Overall Financial Resource Strain (CARDIA)    Difficulty of Paying Living Expenses: Not hard at all  Food Insecurity: Not on file  Transportation Needs: Not on file  Physical Activity: Not on file  Stress: Not on file  Social Connections: Not on file  Intimate Partner Violence: Not on file    Review of Systems  All other systems reviewed and are negative.      Objective:   Physical Exam Constitutional:      General: She is not in acute distress.    Appearance: Normal appearance. She is normal weight. She is not ill-appearing, toxic-appearing or diaphoretic.  HENT:     Right Ear: Tympanic membrane and ear canal normal.     Left Ear: Tympanic membrane and ear canal normal.     Mouth/Throat:     Mouth: Mucous membranes are moist. No oral lesions or angioedema.     Dentition: No gingival swelling or gum lesions.     Tongue: No lesions.     Tonsils: No tonsillar exudate or tonsillar abscesses.  Neck:     Thyroid: No thyroid mass.  Cardiovascular:     Rate and Rhythm: Normal rate and regular rhythm.     Pulses: Normal pulses.     Heart sounds: Normal heart sounds. No murmur heard.    No friction rub. No gallop.  Pulmonary:     Effort: Pulmonary effort is normal. No respiratory distress.     Breath sounds: Normal breath sounds. No stridor.  Musculoskeletal:     Cervical back: Normal range of motion and neck supple. No pain with movement.  Lymphadenopathy:     Cervical:     Right cervical: No superficial or deep cervical adenopathy.    Left cervical: No superficial or deep  cervical adenopathy.  Neurological:     Mental Status: She is alert.           Assessment & Plan:  Pulmonary hypertension ([place]) I did the best I could to try to answer the patient's questions.  I explained to her that this was an estimated pulmonary artery pressure.  The only way to truly gauge her pulmonary artery pressure is to do a right heart catheterization.  I explained to her that if she develops shortness of breath or dyspnea on exertion we would want to pursue that.  I also discussed with pulmonary artery hypertension is, the symptoms that it causes, and the 5 potential classifications.  At the present time she is asymptomatic.

## 2022-12-15 ENCOUNTER — Encounter: Payer: Self-pay | Admitting: Family Medicine

## 2022-12-16 DIAGNOSIS — C50911 Malignant neoplasm of unspecified site of right female breast: Secondary | ICD-10-CM | POA: Diagnosis not present

## 2022-12-24 ENCOUNTER — Ambulatory Visit (INDEPENDENT_AMBULATORY_CARE_PROVIDER_SITE_OTHER): Payer: Medicare HMO | Admitting: Family Medicine

## 2022-12-24 ENCOUNTER — Encounter: Payer: Self-pay | Admitting: Family Medicine

## 2022-12-24 VITALS — BP 120/60 | HR 70 | Temp 98.4°F | Ht 66.0 in | Wt 183.0 lb

## 2022-12-24 DIAGNOSIS — R350 Frequency of micturition: Secondary | ICD-10-CM | POA: Diagnosis not present

## 2022-12-24 DIAGNOSIS — N3001 Acute cystitis with hematuria: Secondary | ICD-10-CM | POA: Diagnosis not present

## 2022-12-24 LAB — URINALYSIS, ROUTINE W REFLEX MICROSCOPIC
Bilirubin Urine: NEGATIVE
Glucose, UA: NEGATIVE
Hyaline Cast: NONE SEEN /LPF
Ketones, ur: NEGATIVE
Nitrite: POSITIVE — AB
Protein, ur: NEGATIVE
Specific Gravity, Urine: 1.02 (ref 1.001–1.035)
pH: 5.5 (ref 5.0–8.0)

## 2022-12-24 LAB — MICROSCOPIC MESSAGE

## 2022-12-24 MED ORDER — CEPHALEXIN 500 MG PO CAPS: 500.0000 mg | ORAL_CAPSULE | Freq: Two times a day (BID) | ORAL | 0 refills | Status: DC

## 2022-12-24 NOTE — Assessment & Plan Note (Signed)
Urine dipstick shows positive for RBC's, positive for nitrates, and positive for leukocytes.  Micro exam: 10-20 WBC's per HPF, 0-2 RBC's per HPF, and many+ bacteria. Start Cephalexin '500mg'$  BID for 7 days. Return to office if symptoms persist or worsen.

## 2022-12-24 NOTE — Addendum Note (Signed)
Addended by: Rubie Maid on: 12/24/2022 09:54 AM   Modules accepted: Orders

## 2022-12-24 NOTE — Progress Notes (Signed)
   Acute Office Visit  Subjective:     Patient ID: Alexandra Calderon, female    DOB: 07/23/1941, 82 y.o.   MRN: [id number]  Chief Complaint  Patient presents with   Acute Visit    UTI? sx: frequency, odor   Urinary Tract Infection    Urinary Tract Infection    Patient is in today for 2 days of urinary frequency and malodor. She does have a medical history of frequent UTIs. Denies dysuria, fever, chills, body aches, abdominal pain, flank pain, discharge, bleeding, or itching. Has tried nothing.   Review of Systems  All other systems reviewed and are negative.       Objective:    BP 120/60   Pulse 70   Temp 98.4 F (36.9 C) (Oral)   Ht 5\' 6"  (1.676 m)   Wt 183 lb (83 kg)   LMP  (LMP Unknown)   SpO2 97%   BMI 29.54 kg/m    Physical Exam Vitals and nursing note reviewed.  Constitutional:      Appearance: Normal appearance. She is normal weight.  HENT:     Head: Normocephalic and atraumatic.  Abdominal:     Tenderness: There is no abdominal tenderness. There is no right CVA tenderness or left CVA tenderness.  Skin:    General: Skin is warm and dry.  Neurological:     General: No focal deficit present.     Mental Status: She is alert and oriented to person, place, and time. Mental status is at baseline.  Psychiatric:        Mood and Affect: Mood normal.        Behavior: Behavior normal.        Thought Content: Thought content normal.        Judgment: Judgment normal.     No results found for any visits on 12/24/22.      Assessment & Plan:   Problem List Items Addressed This Visit       Genitourinary   Acute cystitis with hematuria - Primary    Urine dipstick shows positive for RBC's, positive for nitrates, and positive for leukocytes.  Micro exam: 10-20 WBC's per HPF, 0-2 RBC's per HPF, and many+ bacteria. Start Cephalexin 500mg  BID for 7 days. Return to office if symptoms persist or worsen.        Meds ordered this encounter  Medications    cephALEXin (KEFLEX) 500 MG capsule    Sig: Take 1 capsule (500 mg total) by mouth 2 (two) times daily.    Dispense:  14 capsule    Refill:  0    Order Specific Question:   Supervising Provider    Answer:   Jenna Luo T [[id number]]    Return if symptoms worsen or fail to improve.  Rubie Maid, FNP

## 2022-12-25 ENCOUNTER — Ambulatory Visit (HOSPITAL_COMMUNITY)
Admission: RE | Admit: 2022-12-25 | Discharge: 2022-12-25 | Disposition: A | Discharge status: Home / Self Care | Payer: Medicare HMO | Source: Ambulatory Visit | Attending: Physician Assistant | Admitting: Physician Assistant

## 2022-12-25 VITALS — BP 150/68 | HR 67 | Ht 66.0 in | Wt 182.8 lb

## 2022-12-25 DIAGNOSIS — E785 Hyperlipidemia, unspecified: Secondary | ICD-10-CM | POA: Diagnosis not present

## 2022-12-25 DIAGNOSIS — I10 Essential (primary) hypertension: Secondary | ICD-10-CM | POA: Diagnosis not present

## 2022-12-25 DIAGNOSIS — I4892 Unspecified atrial flutter: Secondary | ICD-10-CM | POA: Insufficient documentation

## 2022-12-25 DIAGNOSIS — Z7901 Long term (current) use of anticoagulants: Secondary | ICD-10-CM | POA: Diagnosis not present

## 2022-12-25 DIAGNOSIS — I480 Paroxysmal atrial fibrillation: Secondary | ICD-10-CM | POA: Diagnosis present

## 2022-12-25 DIAGNOSIS — D6869 Other thrombophilia: Secondary | ICD-10-CM | POA: Diagnosis not present

## 2022-12-25 DIAGNOSIS — I2510 Atherosclerotic heart disease of native coronary artery without angina pectoris: Secondary | ICD-10-CM | POA: Insufficient documentation

## 2022-12-25 MED ORDER — APIXABAN 5 MG PO TABS: 5.0000 mg | ORAL_TABLET | Freq: Two times a day (BID) | ORAL | 6 refills | Status: DC

## 2022-12-25 NOTE — Progress Notes (Signed)
Primary Care Physician: Susy Frizzle, MD Primary Cardiologist: Dr Acie Fredrickson Primary Electrophysiologist: none Referring Physician: [facility]   Alexandra Calderon is a 82 y.o. female with a history of HLD, HTN, PVCs, CAD, atrial fibrillation, and atrial flutter who presents for follow up in the [facility].  The patient was initially diagnosed with atrial flutter 05/18/21 after presenting to the ED with symptoms of palpitations. ECG read as afib, appears more consistent with an atrial flutter. She was started on a diltiazem bolus and converted to SR. Patient was started on Eliquis for a CHADS2VASC score of 5. She denies alcohol use.  On follow up today, patient reports that she has done well since her last visit. She has brief palpitations lasting only a few seconds but they do occur at least daily. There are no specific triggers that she can identify. She has an Visual merchandiser. No bleeding issues on anticoagulation.   Today, she denies symptoms of chest pain, shortness of breath, orthopnea, PND, lower extremity edema, dizziness, presyncope, syncope, bleeding, or neurologic sequela. The patient is tolerating medications without difficulties and is otherwise without complaint today.    Atrial Fibrillation Risk Factors:  she does not have symptoms or diagnosis of sleep apnea. Negative sleep study. she does not have a history of rheumatic fever. she does not have a history of alcohol use. The patient does not have a history of early familial atrial fibrillation or other arrhythmias.  she has a BMI of Body mass index is 29.5 kg/m.Marland Kitchen Filed Weights   12/25/22 0959  Weight: 82.9 kg    Family History  Problem Relation Age of Onset   Lymphoma Father    Hodgkin's lymphoma Father    Colon cancer Mother        early 98's   Diabetes Maternal Aunt    Diabetes Paternal Grandmother    Colon polyps Neg Hx    Rectal cancer Neg Hx    Stomach cancer Neg Hx    Pancreatic  cancer Neg Hx    Esophageal cancer Neg Hx      Atrial Fibrillation Management history:  Previous antiarrhythmic drugs: none Previous cardioversions: none Previous ablations: none CHADS2VASC score: 5 Anticoagulation history: Eliquis   Past Medical History:  Diagnosis Date   Allergy    Arthritis    Breast cancer ([place])    right   Cataract    bilateral   Colon adenomas 2003,2006   Constipation    Diverticulosis    Dysrhythmia    Esophagitis    Fatty liver    GERD (gastroesophageal reflux disease)    Hearing loss    History of kidney stones    Hyperlipidemia    Hypertension    Irritable bowel syndrome 08/27/2012   Kidney stones    Osteopenia    Palpitations    Personal history of radiation therapy    Pneumonia    Polyp, stomach    PVC (premature ventricular contraction)    PVC (premature ventricular contraction)    UTI (lower urinary tract infection)    Past Surgical History:  Procedure Laterality Date   BREAST LUMPECTOMY Right 07/01/11   w/sentinal node bx - right side   COLONOSCOPY W/ BIOPSIES     DILATION AND CURETTAGE OF UTERUS     ESOPHAGOGASTRODUODENOSCOPY     kidney stones     LAPAROSCOPIC CHOLECYSTECTOMY SINGLE SITE WITH INTRAOPERATIVE CHOLANGIOGRAM N/A 04/25/2021   Procedure: LAPAROSCOPIC CHOLECYSTECTOMY SINGLE SITE WITH INTRAOPERATIVE CHOLANGIOGRAM, POSSIBLE  NEEDLE CORE LIVER BX;  Surgeon: [name], MD;  Location: [facility];  Service: General;  Laterality: N/A;   shoulder arthroscopy rt 2009     SHOULDER SURGERY      Current Outpatient Medications  Medication Sig Dispense Refill   alendronate (FOSAMAX) 70 MG tablet TAKE 1 TABLET BY MOUTH EVERY 7 DAYS WITH A FULL GLASS OF WATER ON AN EMPTY STOMACH. 12 tablet 3   atenolol (TENORMIN) 50 MG tablet TAKE 1 TABLET BY MOUTH TWICE A DAY 180 tablet 3   cephALEXin (KEFLEX) 500 MG capsule Take 1 capsule (500 mg total) by mouth 2 (two) times daily. 14 capsule 0   Cholecalciferol (VITAMIN D) 50 MCG (2000 UT) CAPS  Take 2,000 Units by mouth daily.     diltiazem (CARDIZEM) 30 MG tablet Take 1 tablet every 4 hours AS NEEDED for heart rate >100 30 tablet 4   Docusate Sodium (DSS) 100 MG CAPS Take 1 capsule by mouth daily.     losartan (COZAAR) 25 MG tablet TAKE 1 TABLET BY MOUTH EVERY DAY 90 tablet 2   omeprazole (PRILOSEC) 40 MG capsule TAKE 1 CAPSULE BY MOUTH EVERY DAY 90 capsule 0   Probiotic Product (PROBIOTIC DAILY PO) Take 1 tablet by mouth every morning.     rosuvastatin (CRESTOR) 5 MG tablet TAKE 1 TABLET BY MOUTH EVERY DAY 90 tablet 3   apixaban (ELIQUIS) 5 MG TABS tablet Take 1 tablet (5 mg total) by mouth 2 (two) times daily. 60 tablet 6   Current Facility-Administered Medications  Medication Dose Route Frequency Provider Last Rate Last Admin   0.9 %  sodium chloride infusion  500 mL Intravenous Once Gatha Mayer, MD        Allergies  Allergen Reactions   Gatifloxacin     unknown   Nitrofurantoin Macrocrystal     unknown   Azithromycin Rash   Levofloxacin Rash    REACTION: rash    Sulfamethoxazole-Trimethoprim Rash and Other (See Comments)    Chills, fever   Sulfonamide Derivatives Rash and Other (See Comments)    Chills, fever    Social History   Socioeconomic History   Marital status: Married    Spouse name: Not on file   Number of children: 2   Years of education: Not on file   Highest education level: Not on file  Occupational History   Occupation: retired  Tobacco Use   Smoking status: Never   Smokeless tobacco: Never   Tobacco comments:    Never smoke 12/25/21  Vaping Use   Vaping Use: Never used  Substance and Sexual Activity   Alcohol use: No   Drug use: No   Sexual activity: Yes  Other Topics Concern   Not on file  Social History Narrative   Spouse:  [name]   Children:   [name]- age 36- [place]- age 21- [place]   Social Determinants of Health   Financial Resource Strain: Low Risk  (06/05/2022)   Overall Financial  Resource Strain (CARDIA)    Difficulty of Paying Living Expenses: Not hard at all  Food Insecurity: Not on file  Transportation Needs: Not on file  Physical Activity: Not on file  Stress: Not on file  Social Connections: Not on file  Intimate Partner Violence: Not on file     ROS- All systems are reviewed and negative except as per the HPI above.  Physical Exam: Vitals:   12/25/22 0959  BP: (!) 150/68  Pulse: 67  Weight: 82.9 kg  Height: '5\' 6"'$  (1.676 m)     GEN- The patient is a well appearing elderly female, alert and oriented x 3 today.   HEENT-head normocephalic, atraumatic, sclera clear, conjunctiva pink, hearing intact, trachea midline. Lungs- Clear to ausculation bilaterally, normal work of breathing Heart- Regular rate and rhythm, no murmurs, rubs or gallops  GI- soft, NT, ND, + BS Extremities- no clubbing, cyanosis, or edema MS- no significant deformity or atrophy Skin- no rash or lesion Psych- euthymic mood, full affect Neuro- strength and sensation are intact   Wt Readings from Last 3 Encounters:  12/25/22 82.9 kg  12/24/22 83 kg  10/28/22 82.5 kg    EKG today demonstrates  SR, LAFB Vent. rate 67 BPM PR interval 188 ms QRS duration 106 ms QT/QTcB 434/458 ms  Echo 06/12/21 demonstrated  1. Left ventricular ejection fraction, by estimation, is 60 to 65%. The left ventricle has normal function. The left ventricle has no regional wall motion abnormalities. Left ventricular diastolic parameters are indeterminate. Elevated left ventricular end-diastolic pressure.   2. Right ventricular systolic function is normal. The right ventricular  size is normal. There is moderately elevated pulmonary artery systolic pressure. The estimated right ventricular systolic pressure is [id number] mmHg.   3. The pericardial effusion is anterior to the right ventricle.   4. The mitral valve is degenerative. Trivial mitral valve regurgitation. Mild mitral stenosis. The mean mitral valve  gradient is 4.0 mmHg. Severe mitral annular calcification.   5. The aortic valve is tricuspid. Aortic valve regurgitation is not  visualized. Mild aortic valve sclerosis is present, with no evidence of aortic valve stenosis.   6. The inferior vena cava is normal in size with greater than 50%  respiratory variability, suggesting right atrial pressure of 3 mmHg.   Epic records are reviewed at length today  CHA2DS2-VASc Score = 5  The patient's score is based upon: CHF History: 0 HTN History: 1 Diabetes History: 0 Stroke History: 0 Vascular Disease History: 1 Age Score: 2 Gender Score: 1        ASSESSMENT AND PLAN: 1. Paroxysmal atrial fibrillation/Atrial flutter The patient's CHA2DS2-VASc score is 5, indicating a 7.2% annual risk of stroke. Patient in [place] today but having frequent palpitations. We discussed wearing a cardiac monitor vs monitoring with her smart watch. She would like to continue monitoring with her Apple Watch, demonstrated how to take an ECG on her watch. She will call if she notes any afib.  Continue diltiazem 30 mg PRN q 4 hours for heart racing. Continue atenolol 50 mg daily Continue Eliquis 5 mg BID  2. Secondary Hypercoagulable State (ICD10:  D68.69) The patient is at significant risk for stroke/thromboembolism based upon her CHA2DS2-VASc Score of 5.  Continue Apixaban (Eliquis).   3. CAD Coronary calcification noted on CT No anginal symptoms.  4. HTN Stable, no changes today.   Follow up with Dr Acie Fredrickson per recall.    [facility] [address] [place], [place] [address] [phone] 12/25/2022 10:37 AM

## 2022-12-26 LAB — URINE CULTURE
MICRO NUMBER:: 14687173
SPECIMEN QUALITY:: ADEQUATE

## 2023-01-01 DIAGNOSIS — C50911 Malignant neoplasm of unspecified site of right female breast: Secondary | ICD-10-CM | POA: Diagnosis not present

## 2023-01-05 ENCOUNTER — Ambulatory Visit (INDEPENDENT_AMBULATORY_CARE_PROVIDER_SITE_OTHER): Payer: Medicare HMO | Admitting: Family Medicine

## 2023-01-05 ENCOUNTER — Encounter: Payer: Self-pay | Admitting: Family Medicine

## 2023-01-05 VITALS — BP 130/60 | HR 61 | Temp 97.9°F | Ht 66.0 in | Wt 185.0 lb

## 2023-01-05 DIAGNOSIS — N390 Urinary tract infection, site not specified: Secondary | ICD-10-CM | POA: Diagnosis not present

## 2023-01-05 DIAGNOSIS — N3001 Acute cystitis with hematuria: Secondary | ICD-10-CM

## 2023-01-05 LAB — URINALYSIS, ROUTINE W REFLEX MICROSCOPIC
Bilirubin Urine: NEGATIVE
Glucose, UA: NEGATIVE
Hyaline Cast: NONE SEEN /LPF
Ketones, ur: NEGATIVE
Nitrite: NEGATIVE
Protein, ur: NEGATIVE
Specific Gravity, Urine: 1.025 (ref 1.001–1.035)
pH: 5.5 (ref 5.0–8.0)

## 2023-01-05 LAB — MICROSCOPIC MESSAGE

## 2023-01-05 MED ORDER — AMOXICILLIN-POT CLAVULANATE 500-125 MG PO TABS: 1.0000 | ORAL_TABLET | Freq: Two times a day (BID) | ORAL | 0 refills | Status: AC

## 2023-01-05 NOTE — Assessment & Plan Note (Signed)
Culture sent. UA positive. Will treat with Augmentin 500-125mg  BID for 7 days. Encouraged her to return to urology for cystoscopy to further evaluate hematuria and recurrent UTIs. Return to office if symptoms persist or worsen.

## 2023-01-05 NOTE — Progress Notes (Signed)
Acute Office Visit  Subjective:     Patient ID: Alexandra Calderon, female    DOB: 03-Jun-1941, 82 y.o.   MRN: [id number]  Chief Complaint  Patient presents with   Urinary Tract Infection    Urinary Tract Infection    Patient is in today for recurring UTI symptoms. She was treated on 3/13 for malodorous urine with Cephalexin. Her symptoms improved and returned 4 days ago and now she is having dysuria, frequency, and less output. Denies abdominal pain, flank pain, fever, vaginal itching, discharge, or bleeding, or hematuria. Has tried Azo. Has been to urology before and cystoscopy was recommended but she did not follow through on this.  Review of Systems  All other systems reviewed and are negative.       Objective:    BP 130/60   Pulse 61   Temp 97.9 F (36.6 C) (Oral)   Ht 5\' 6"  (1.676 m)   Wt 185 lb (83.9 kg)   LMP  (LMP Unknown)   SpO2 97%   BMI 29.86 kg/m    Physical Exam Vitals and nursing note reviewed.  Constitutional:      Appearance: Normal appearance. She is normal weight.  HENT:     Head: Normocephalic and atraumatic.  Abdominal:     General: There is no distension.     Tenderness: There is no abdominal tenderness. There is no right CVA tenderness or left CVA tenderness.  Skin:    General: Skin is warm and dry.  Neurological:     General: No focal deficit present.     Mental Status: She is alert and oriented to person, place, and time. Mental status is at baseline.  Psychiatric:        Mood and Affect: Mood normal.        Behavior: Behavior normal.        Thought Content: Thought content normal.        Judgment: Judgment normal.     Results for orders placed or performed in visit on 01/05/23  Urinalysis, Routine w reflex microscopic  Result Value Ref Range   Color, Urine YELLOW YELLOW   APPearance SLIGHTLY CLOUDY (A) CLEAR   Specific Gravity, Urine 1.025 1.001 - 1.035   pH 5.5 5.0 - 8.0   Glucose, UA NEGATIVE NEGATIVE   Bilirubin Urine  NEGATIVE NEGATIVE   Ketones, ur NEGATIVE NEGATIVE   Hgb urine dipstick 1+ (A) NEGATIVE   Protein, ur NEGATIVE NEGATIVE   Nitrite NEGATIVE NEGATIVE   Leukocytes,Ua TRACE (A) NEGATIVE   WBC, UA 0-5 0 - 5 /HPF   RBC / HPF 3-10 (A) 0 - 2 /HPF   Squamous Epithelial / HPF 0-5 < OR = 5 /HPF   Bacteria, UA FEW (A) NONE SEEN /HPF   Hyaline Cast NONE SEEN NONE SEEN /LPF  Microscopic Message  Result Value Ref Range   Note          Assessment & Plan:   Problem List Items Addressed This Visit       Genitourinary   Acute cystitis with hematuria - Primary    Culture sent. UA positive. Will treat with Augmentin 500-125mg  BID for 7 days. Encouraged her to return to urology for cystoscopy to further evaluate hematuria and recurrent UTIs. Return to office if symptoms persist or worsen.      Relevant Medications   amoxicillin-clavulanate (AUGMENTIN) 500-125 MG tablet   Other Relevant Orders   Urinalysis, Routine w reflex microscopic (Completed)   Ambulatory referral  to Urology   Microscopic Message (Completed)    Meds ordered this encounter  Medications   amoxicillin-clavulanate (AUGMENTIN) 500-125 MG tablet    Sig: Take 1 tablet by mouth 2 (two) times daily for 7 days.    Dispense:  14 tablet    Refill:  0    Order Specific Question:   Supervising Provider    Answer:   Jenna Luo T [[id number]]    No follow-ups on file.  Rubie Maid, FNP

## 2023-01-20 ENCOUNTER — Ambulatory Visit
Admission: RE | Admit: 2023-01-20 | Discharge: 2023-01-20 | Disposition: A | Discharge status: Home / Self Care | Payer: Medicare HMO | Source: Ambulatory Visit | Attending: Family Medicine | Admitting: Family Medicine

## 2023-01-20 DIAGNOSIS — M85851 Other specified disorders of bone density and structure, right thigh: Secondary | ICD-10-CM | POA: Diagnosis not present

## 2023-01-20 DIAGNOSIS — Z780 Asymptomatic menopausal state: Secondary | ICD-10-CM | POA: Diagnosis not present

## 2023-01-20 DIAGNOSIS — M8580 Other specified disorders of bone density and structure, unspecified site: Secondary | ICD-10-CM

## 2023-01-23 ENCOUNTER — Other Ambulatory Visit: Payer: Self-pay | Admitting: Family Medicine

## 2023-01-23 NOTE — Telephone Encounter (Signed)
Requested Prescriptions  Pending Prescriptions Disp Refills   omeprazole (PRILOSEC) 40 MG capsule [Pharmacy Med Name: OMEPRAZOLE DR 40 MG CAPSULE] 90 capsule 0    Sig: TAKE 1 CAPSULE BY MOUTH EVERY DAY     Gastroenterology: Proton Pump Inhibitors Passed - 01/23/2023  2:24 AM      Passed - Valid encounter within last 12 months    Recent Outpatient Visits           12 months ago Chronic sore throat   [facility] Tanya Nones, Priscille Heidelberg, MD   1 year ago Dysuria   [facility] Donita Brooks, MD   1 year ago Need for immunization against influenza   [facility] Donita Brooks, MD   1 year ago Dysuria   [facility] Donita Brooks, MD   1 year ago COVID-19   [facility] Valentino Nose, NP       Future Appointments             In 1 week Pickard, Priscille Heidelberg, MD [facility]

## 2023-02-03 ENCOUNTER — Ambulatory Visit (INDEPENDENT_AMBULATORY_CARE_PROVIDER_SITE_OTHER): Payer: Medicare HMO | Admitting: Family Medicine

## 2023-02-03 ENCOUNTER — Encounter: Payer: Self-pay | Admitting: Family Medicine

## 2023-02-03 VITALS — BP 126/72 | HR 65 | Ht 66.0 in | Wt 185.6 lb

## 2023-02-03 DIAGNOSIS — H8111 Benign paroxysmal vertigo, right ear: Secondary | ICD-10-CM

## 2023-02-03 DIAGNOSIS — G4753 Recurrent isolated sleep paralysis: Secondary | ICD-10-CM | POA: Insufficient documentation

## 2023-02-03 DIAGNOSIS — R270 Ataxia, unspecified: Secondary | ICD-10-CM

## 2023-02-03 DIAGNOSIS — G4733 Obstructive sleep apnea (adult) (pediatric): Secondary | ICD-10-CM | POA: Insufficient documentation

## 2023-02-03 DIAGNOSIS — R400 Somnolence: Secondary | ICD-10-CM | POA: Insufficient documentation

## 2023-02-03 HISTORY — DX: Obstructive sleep apnea (adult) (pediatric): G47.33

## 2023-02-03 NOTE — Progress Notes (Signed)
Subjective:    Patient ID: Alexandra Calderon, female    DOB: [birth year], 82 y.o.   MRN: [id number]  HPI Patient reports ataxia for 1 month.  She states if she turns her head to the right while walking, she will lose her balance and staggering.  She states that if she is lying in bed and she rolls over, she will lose her balance and staggering the walls but she tries to get up and walk to the bathroom.  She denies any room spinning but she does report feeling off balance.  This typically occurs if she turns her head to the right.  Cranial nerves II through XII are grossly intact today.  Muscle strength is 5/5 equal and symmetric in the upper and lower extremities.  There is no neurologic deficit.  Romberg testing is normal.  She has a normal gait.  There is no visible ataxia today in the clinic.  Symptoms are occurred off and on for the last month seem to be getting slightly better.  She only has room spinning when she rolls over in bed.  However if she turns her head to the right, there is a feeling of out of balance and disequilibrium that causes her to stagger to the right.  This symptom is not present if she keeps her head straight. Past Medical History:  Diagnosis Date   Allergy    Arthritis    Breast cancer ([facility])    right   Cataract    bilateral   Colon adenomas 2003,2006   Constipation    Diverticulosis    Dysrhythmia    Esophagitis    Fatty liver    GERD (gastroesophageal reflux disease)    Hearing loss    History of kidney stones    Hyperlipidemia    Hypertension    Irritable bowel syndrome 08/27/2012   Kidney stones    Osteopenia    Palpitations    Personal history of radiation therapy    Pneumonia    Polyp, stomach    PVC (premature ventricular contraction)    PVC (premature ventricular contraction)    UTI (lower urinary tract infection)    Past Surgical History:  Procedure Laterality Date   BREAST LUMPECTOMY Right 07/01/11   w/sentinal node bx - right side    COLONOSCOPY W/ BIOPSIES     DILATION AND CURETTAGE OF UTERUS     ESOPHAGOGASTRODUODENOSCOPY     kidney stones     LAPAROSCOPIC CHOLECYSTECTOMY SINGLE SITE WITH INTRAOPERATIVE CHOLANGIOGRAM N/A 04/25/2021   Procedure: LAPAROSCOPIC CHOLECYSTECTOMY SINGLE SITE WITH INTRAOPERATIVE CHOLANGIOGRAM, POSSIBLE NEEDLE CORE LIVER BX;  Surgeon: Karie Soda, MD;  Location: [facility];  Service: General;  Laterality: N/A;   shoulder arthroscopy rt 2009     SHOULDER SURGERY     Current Outpatient Medications on File Prior to Visit  Medication Sig Dispense Refill   alendronate (FOSAMAX) 70 MG tablet TAKE 1 TABLET BY MOUTH EVERY 7 DAYS WITH A FULL GLASS OF WATER ON AN EMPTY STOMACH. 12 tablet 3   apixaban (ELIQUIS) 5 MG TABS tablet Take 1 tablet (5 mg total) by mouth 2 (two) times daily. 60 tablet 6   atenolol (TENORMIN) 50 MG tablet TAKE 1 TABLET BY MOUTH TWICE A DAY 180 tablet 3   Cholecalciferol (VITAMIN D) 50 MCG (2000 UT) CAPS Take 2,000 Units by mouth daily.     diltiazem (CARDIZEM) 30 MG tablet Take 1 tablet every 4 hours AS NEEDED for heart rate >100 30 tablet 4  Docusate Sodium (DSS) 100 MG CAPS Take 1 capsule by mouth daily.     losartan (COZAAR) 25 MG tablet TAKE 1 TABLET BY MOUTH EVERY DAY 90 tablet 2   omeprazole (PRILOSEC) 40 MG capsule TAKE 1 CAPSULE BY MOUTH EVERY DAY 90 capsule 0   Probiotic Product (PROBIOTIC DAILY PO) Take 1 tablet by mouth every morning.     rosuvastatin (CRESTOR) 5 MG tablet TAKE 1 TABLET BY MOUTH EVERY DAY 90 tablet 3   Current Facility-Administered Medications on File Prior to Visit  Medication Dose Route Frequency Provider Last Rate Last Admin   0.9 %  sodium chloride infusion  500 mL Intravenous Once Iva Boop, MD       Allergies  Allergen Reactions   Gatifloxacin     unknown   Nitrofurantoin Macrocrystal     unknown   Azithromycin Rash   Levofloxacin Rash    REACTION: rash    Sulfamethoxazole-Trimethoprim Rash and Other (See Comments)    Chills, fever    Sulfonamide Derivatives Rash and Other (See Comments)    Chills, fever   Social History   Socioeconomic History   Marital status: Married    Spouse name: Not on file   Number of children: 2   Years of education: Not on file   Highest education level: Not on file  Occupational History   Occupation: retired  Tobacco Use   Smoking status: Never   Smokeless tobacco: Never   Tobacco comments:    Never smoke 12/25/21  Vaping Use   Vaping Use: Never used  Substance and Sexual Activity   Alcohol use: No   Drug use: No   Sexual activity: Yes  Other Topics Concern   Not on file  Social History Narrative   Spouse:  [name]   Children:   [name]- age 49- [place]   Ulis Rias- age 23-    Social Determinants of Health   Financial Resource Strain: Low Risk  (01/04/2023)   Overall Financial Resource Strain (CARDIA)    Difficulty of Paying Living Expenses: Not hard at all  Food Insecurity: No Food Insecurity (01/04/2023)   Hunger Vital Sign    Worried About Running Out of Food in the Last Year: Never true    Ran Out of Food in the Last Year: Never true  Transportation Needs: No Transportation Needs (01/04/2023)   PRAPARE - Administrator, Civil Service (Medical): No    Lack of Transportation (Non-Medical): No  Physical Activity: Unknown (01/04/2023)   Exercise Vital Sign    Days of Exercise per Week: Patient declined    Minutes of Exercise per Session: Not on file  Stress: Patient Declined (01/04/2023)   [employer] of Occupational Health - Occupational Stress Questionnaire    Feeling of Stress : Patient declined  Social Connections: Unknown (01/04/2023)   Social Connection and Isolation Panel [NHANES]    Frequency of Communication with Friends and Family: Patient declined    Frequency of Social Gatherings with Friends and Family: Patient declined    Attends Religious Services: Patient declined    Database administrator or Organizations: Yes     Attends Banker Meetings: Patient declined    Marital Status: Married  Catering manager Violence: Not on file    Review of Systems  All other systems reviewed and are negative.      Objective:   Physical Exam Constitutional:      General: She is not in acute  distress.    Appearance: Normal appearance. She is normal weight. She is not ill-appearing, toxic-appearing or diaphoretic.  HENT:     Right Ear: Tympanic membrane and ear canal normal.     Left Ear: Tympanic membrane and ear canal normal.     Mouth/Throat:     Mouth: Mucous membranes are moist. No oral lesions or angioedema.     Dentition: No gingival swelling or gum lesions.     Tongue: No lesions.     Tonsils: No tonsillar exudate or tonsillar abscesses.  Neck:     Thyroid: No thyroid mass.  Cardiovascular:     Rate and Rhythm: Normal rate and regular rhythm.     Pulses: Normal pulses.     Heart sounds: Normal heart sounds. No murmur heard.    No friction rub. No gallop.  Pulmonary:     Effort: Pulmonary effort is normal. No respiratory distress.     Breath sounds: Normal breath sounds. No stridor.  Musculoskeletal:     Cervical back: Normal range of motion and neck supple. No pain with movement.  Lymphadenopathy:     Cervical:     Right cervical: No superficial or deep cervical adenopathy.    Left cervical: No superficial or deep cervical adenopathy.  Neurological:     General: No focal deficit present.     Mental Status: She is alert and oriented to person, place, and time. Mental status is at baseline.     Cranial Nerves: No cranial nerve deficit.     Sensory: No sensory deficit.     Motor: No weakness.     Coordination: Coordination normal.     Gait: Gait normal.     Deep Tendon Reflexes: Reflexes normal.           Assessment & Plan:  Benign paroxysmal positional vertigo of right ear - Plan: Ambulatory referral to Physical Therapy  Ataxia  Symptoms sound consistent with BPPV to the  right.  I do not believe the patient has any evidence of a cerebellar infarct on her physical exam.  There is no pronator drift.  She has normal gait.  The ataxia only occurs when she turns her head to the right.  Therefore I will refer the patient to physical therapy for Epley maneuvers to see if we can treat the symptoms.  If worsening or persistent, would recommend an MRI of the brain

## 2023-02-04 ENCOUNTER — Encounter: Payer: Self-pay | Admitting: Physical Therapy

## 2023-02-04 ENCOUNTER — Telehealth: Payer: Self-pay

## 2023-02-04 ENCOUNTER — Other Ambulatory Visit: Payer: Self-pay

## 2023-02-04 ENCOUNTER — Ambulatory Visit: Payer: Medicare HMO | Admitting: Physical Therapy

## 2023-02-04 DIAGNOSIS — R2681 Unsteadiness on feet: Secondary | ICD-10-CM | POA: Diagnosis not present

## 2023-02-04 DIAGNOSIS — R42 Dizziness and giddiness: Secondary | ICD-10-CM | POA: Diagnosis not present

## 2023-02-04 NOTE — Telephone Encounter (Signed)
Pt called in stating that physical therapist that was out to see her today suggested that pt may need to ask for a referral for an MRI. Please advise  Cb#: [phone]

## 2023-02-04 NOTE — Therapy (Addendum)
OUTPATIENT PHYSICAL THERAPY VESTIBULAR EVALUATION     Patient Name: Alexandra Calderon MRN: [id number] DOB:November 07, 1940, 82 y.o., female Today's Date: 02/04/2023  END OF SESSION:  PT End of Session - 02/04/23 1529     Visit Number 1    Number of Visits 8    Date for PT Re-Evaluation 04/01/23    Authorization Type [employer]; $20 copay    Progress Note Due on Visit 10    PT Start Time 1430    PT Stop Time 1512    PT Time Calculation (min) 42 min    Activity Tolerance Patient tolerated treatment well    Behavior During Therapy [facility] for tasks assessed/performed             Past Medical History:  Diagnosis Date   Allergy    Arthritis    Breast cancer    right   Cataract    bilateral   Colon adenomas 2003,2006   Constipation    Diverticulosis    Dysrhythmia    Esophagitis    Fatty liver    GERD (gastroesophageal reflux disease)    Hearing loss    History of kidney stones    Hyperlipidemia    Hypertension    Irritable bowel syndrome 08/27/2012   Kidney stones    Obstructive sleep apnea (adult) (pediatric) 02/03/2023   Osteopenia    Palpitations    Personal history of radiation therapy    Pneumonia    Polyp, stomach    PVC (premature ventricular contraction)    PVC (premature ventricular contraction)    UTI (lower urinary tract infection)    Past Surgical History:  Procedure Laterality Date   BREAST LUMPECTOMY Right 07/01/11   w/sentinal node bx - right side   COLONOSCOPY W/ BIOPSIES     DILATION AND CURETTAGE OF UTERUS     ESOPHAGOGASTRODUODENOSCOPY     kidney stones     LAPAROSCOPIC CHOLECYSTECTOMY SINGLE SITE WITH INTRAOPERATIVE CHOLANGIOGRAM N/A 04/25/2021   Procedure: LAPAROSCOPIC CHOLECYSTECTOMY SINGLE SITE WITH INTRAOPERATIVE CHOLANGIOGRAM, POSSIBLE NEEDLE CORE LIVER BX;  Surgeon: Karie Soda, MD;  Location: [facility];  Service: General;  Laterality: N/A;   shoulder arthroscopy rt 2009     SHOULDER SURGERY     Patient Active Problem List   Diagnosis  Date Noted   Daytime somnolence 02/03/2023   Obstructive sleep apnea (adult) (pediatric) 02/03/2023   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   Acute cystitis with hematuria 12/24/2022   History of recurrent UTI (urinary tract infection) 04/18/2022   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/18/2022   Family history of gastroesophageal reflux disease 04/01/2022   Pulmonary HTN 08/19/2021   Paroxysmal atrial fibrillation 05/22/2021   Hypercoagulable state due to paroxysmal atrial fibrillation 05/22/2021   Personal history of breast cancer 09/16/2017   Primary malignant neoplasm of breast 09/16/2017   PVC's (premature ventricular contractions) 02/10/2017   Irritable bowel syndrome 08/27/2012   Breast cancer of upper-inner quadrant of right female breast 08/18/2011   Neoplasm of breast 10/13/2010   CYSTITIS, CHRONIC 03/29/2010   Palpitations 09/19/2009   GERD 11/27/2008   Osteoarthritis 08/28/2008   ROTATOR CUFF TEAR 05/18/2008   Dyslipidemia 07/01/2007   Essential hypertension 04/07/2007   Disorder of bone and cartilage 04/07/2007    PCP: Donita Brooks, MD REFERRING PROVIDER: Donita Brooks, MD  REFERRING DIAG: H81.11 (ICD-10-CM) - Benign paroxysmal positional vertigo of right ear  THERAPY DIAG:  Dizziness and giddiness - Plan: PT plan of care cert/re-cert  Unsteadiness  on feet - Plan: PT plan of care cert/re-cert  ONSET DATE: 01/04/23  Rationale for Evaluation and Treatment: Rehabilitation  SUBJECTIVE:   SUBJECTIVE STATEMENT: Pt reports about a one month history of vertigo.  She notices more staggering when walking in busy environments; and coming back up to sitting. Pt accompanied by: self  PERTINENT HISTORY: arthritis, breast cancer, hearing loss, HTN, osteopenia, PNA  PAIN:  Are you having pain? No  PRECAUTIONS: Fall  WEIGHT BEARING RESTRICTIONS: No  FALLS: Has patient fallen in last 6 months? No  LIVING ENVIRONMENT: Lives with: lives with  their spouse Lives in: House/apartment  PLOF: Independent and Leisure: spend time with family; does walk occasionally  PATIENT GOALS: improve dizziness  OBJECTIVE:   DIAGNOSTIC FINDINGS: MRI if not improved  COGNITION: Overall cognitive status: Within functional limits for tasks assessed   SENSATION: WFL  GAIT: 02/04/23 Gait pattern: wide BOS Distance walked: 100' Assistive device utilized: None Level of assistance: Complete Independence Comments: mildly wide BOS otherwise no significant deviations noted  BALANCE Unable to maintain balance on compliant surface with eyes closed; LOB posteriorly   VESTIBULAR ASSESSMENT:  GENERAL OBSERVATION: Reports she feels "a little funny" currently; rated 4/10; up to 7-8/10   SYMPTOM BEHAVIOR: Subjective history: see subjective Non-Vestibular symptoms:  none Type of dizziness: Imbalance (Disequilibrium), Lightheadedness/Faint, and "Funny feeling in the head" Frequency: constant Duration: constant Aggravating factors: Induced by position change: returning upright from bending forward and Induced by motion: turning body quickly and turning head quickly Relieving factors:  sitting and resting Progression of symptoms: better  OCULOMOTOR EXAM: Ocular Alignment: normal Ocular ROM: intact Spontaneous Nystagmus: absent Gaze-Induced Nystagmus: absent Smooth Pursuits: intact Saccades: intact and mild symptoms   VESTIBULAR - OCULAR REFLEX:  Slow VOR: Normal Head-Impulse Test: HIT Right: negative HIT Left: negative    POSITIONAL TESTING: Right Dix-Hallpike: no nystagmus Left Dix-Hallpike: no nystagmus Right Roll Test: no nystagmus Left Roll Test: no nystagmus   OTHOSTATICS:  Supine: 152/80 with HR 63  Standing: 166/85 with HR 67;  Standing x 3 min: 170/85 with HR 65   VESTIBULAR TREATMENT:                                                                                                   DATE:  02/04/23 Gaze Adaptation: x1  Viewing Horizontal: Position: sitting, Time: 30 sec, and Comment: demonstrated for HEP and x1 Viewing Vertical:  Position: sitting, Time: 30 sec, and Comment: demonstrated for HEP  Other: Corner balance exercises on compliant surface: feet together and eyes closed; feet apart, eyes closed and head turns  PATIENT EDUCATION: Education details: HEP, clinical findings Person educated: Patient Education method: Explanation, Demonstration, and Handouts Education comprehension: verbalized understanding, returned demonstration, and needs further education  HOME EXERCISE PROGRAM: Access Code: [email or web address] URL: [email or web address] Date: 02/04/2023 Prepared by: Moshe Cipro  Exercises - Seated Gaze Stabilization with Head Rotation  - 2 x daily - 7 x weekly - 1 sets - 30 seconds - Seated Gaze Stabilization with Head Nod  - 2 x daily - 7 x weekly - 1 sets - 30 seconds -  Corner Balance Feet Together With Eyes Closed  - 2 x daily - 7 x weekly - 1 sets - 5 reps - 10 sec hold - Corner Balance Feet Apart: Eyes Closed With Head Turns  - 2 x daily - 7 x weekly - 1 sets - 10 reps  Patient Education - Gaze Stabilization  GOALS: Goals reviewed with patient? Yes  SHORT TERM GOALS: Target date: 03/04/2023  Independent with HEP Goal status: INITIAL   LONG TERM GOALS: Target date: 04/01/2023  Report 50% reduction in symptoms with positional changes Goal status: INITIAL  2.  Independent with final HEP  Goal status: INITIAL  3.  Demonstrate improved balance by standing on compliant surface with feet together and eyes closed at least 20 sec Goal status: INITIAL   ASSESSMENT:  CLINICAL IMPRESSION: Patient is a 82 y.o. female who was seen today for physical therapy evaluation and treatment for vertigo.  Symptoms and clinical findings most consistent with vestibular hypofunction, but cannot completely rule out CVA or orthostatic hypotension.  Orthostatic hypotension testing negative  today in the clinic, but pt reporting those numbers are higher than what she gets at home, so recommended retesting at home and provided instructions.  She demonstrates decreased balance and vestibular input affecting functional mobility.  She will benefit from PT to address deficits listed.   OBJECTIVE IMPAIRMENTS: Abnormal gait, decreased balance, decreased coordination, decreased mobility, and dizziness.   ACTIVITY LIMITATIONS: lifting, bending, standing, squatting, and locomotion level  PARTICIPATION LIMITATIONS: meal prep, cleaning, laundry, and community activity  PERSONAL FACTORS: 3+ comorbidities: arthritis, breast cancer, hearing loss, HTN, osteopenia, PNA  are also affecting patient's functional outcome.   REHAB POTENTIAL: Good  CLINICAL DECISION MAKING: Evolving/moderate complexity  EVALUATION COMPLEXITY: Moderate   PLAN:  PT FREQUENCY: 1x/week; plan to see biweekly but will see up to 1x/wk PRN  PT DURATION: 8 weeks  PLANNED INTERVENTIONS: Therapeutic exercises, Therapeutic activity, Neuromuscular re-education, Balance training, Gait training, Patient/Family education, Self Care, Vestibular training, Canalith repositioning, Aquatic Therapy, Cryotherapy, Moist heat, Manual therapy, and Re-evaluation  PLAN FOR NEXT SESSION: review HEP and reassess PRN   Clarita Crane, PT, DPT 02/04/23 3:34 PM

## 2023-02-05 ENCOUNTER — Other Ambulatory Visit: Payer: Self-pay | Admitting: Family Medicine

## 2023-02-05 DIAGNOSIS — R270 Ataxia, unspecified: Secondary | ICD-10-CM

## 2023-02-09 ENCOUNTER — Encounter: Payer: Self-pay | Admitting: Family Medicine

## 2023-02-17 ENCOUNTER — Ambulatory Visit: Payer: Medicare HMO | Admitting: Physical Therapy

## 2023-02-17 ENCOUNTER — Encounter: Payer: Self-pay | Admitting: Physical Therapy

## 2023-02-17 DIAGNOSIS — R42 Dizziness and giddiness: Secondary | ICD-10-CM | POA: Diagnosis not present

## 2023-02-17 DIAGNOSIS — R2681 Unsteadiness on feet: Secondary | ICD-10-CM

## 2023-02-17 NOTE — Therapy (Addendum)
OUTPATIENT PHYSICAL THERAPY VESTIBULAR TREATMENT DISCHARGE SUMMARY     Patient Name: Alexandra Calderon MRN: [id number] DOB:1941-08-02, 82 y.o., female Today's Date: 02/17/2023  END OF SESSION:  PT End of Session - 02/17/23 1258     Visit Number 2    Number of Visits 8    Date for PT Re-Evaluation 04/01/23    Authorization Type [employer] Medicare; $20 copay    Progress Note Due on Visit 10    PT Start Time 1302    PT Stop Time 1342    PT Time Calculation (min) 40 min    Activity Tolerance Patient tolerated treatment well    Behavior During Therapy [facility] for tasks assessed/performed              Past Medical History:  Diagnosis Date   Allergy    Arthritis    Breast cancer (HCC)    right   Cataract    bilateral   Colon adenomas 2003,2006   Constipation    Diverticulosis    Dysrhythmia    Esophagitis    Fatty liver    GERD (gastroesophageal reflux disease)    Hearing loss    History of kidney stones    Hyperlipidemia    Hypertension    Irritable bowel syndrome 08/27/2012   Kidney stones    Obstructive sleep apnea (adult) (pediatric) 02/03/2023   Osteopenia    Palpitations    Personal history of radiation therapy    Pneumonia    Polyp, stomach    PVC (premature ventricular contraction)    PVC (premature ventricular contraction)    UTI (lower urinary tract infection)    Past Surgical History:  Procedure Laterality Date   BREAST LUMPECTOMY Right 07/01/11   w/sentinal node bx - right side   COLONOSCOPY W/ BIOPSIES     DILATION AND CURETTAGE OF UTERUS     ESOPHAGOGASTRODUODENOSCOPY     kidney stones     LAPAROSCOPIC CHOLECYSTECTOMY SINGLE SITE WITH INTRAOPERATIVE CHOLANGIOGRAM N/A 04/25/2021   Procedure: LAPAROSCOPIC CHOLECYSTECTOMY SINGLE SITE WITH INTRAOPERATIVE CHOLANGIOGRAM, POSSIBLE NEEDLE CORE LIVER BX;  Surgeon: Karie Soda, MD;  Location: [facility];  Service: General;  Laterality: N/A;   shoulder arthroscopy rt 2009     SHOULDER SURGERY     Patient Active  Problem List   Diagnosis Date Noted   Daytime somnolence 02/03/2023   Obstructive sleep apnea (adult) (pediatric) 02/03/2023   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   Acute cystitis with hematuria 12/24/2022   History of recurrent UTI (urinary tract infection) 04/18/2022   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/18/2022   Family history of gastroesophageal reflux disease 04/01/2022   Pulmonary HTN (HCC) 08/19/2021   Paroxysmal atrial fibrillation (HCC) 05/22/2021   Hypercoagulable state due to paroxysmal atrial fibrillation (HCC) 05/22/2021   Personal history of breast cancer 09/16/2017   Primary malignant neoplasm of breast (HCC) 09/16/2017   PVC's (premature ventricular contractions) 02/10/2017   Irritable bowel syndrome 08/27/2012   Breast cancer of upper-inner quadrant of right female breast (HCC) 08/18/2011   Neoplasm of breast 10/13/2010   CYSTITIS, CHRONIC 03/29/2010   Palpitations 09/19/2009   GERD 11/27/2008   Osteoarthritis 08/28/2008   ROTATOR CUFF TEAR 05/18/2008   Dyslipidemia 07/01/2007   Essential hypertension 04/07/2007   Disorder of bone and cartilage 04/07/2007    PCP: Donita Brooks, MD REFERRING PROVIDER: Donita Brooks, MD  REFERRING DIAG: H81.11 (ICD-10-CM) - Benign paroxysmal positional vertigo of right ear  THERAPY DIAG:  Dizziness and giddiness  Unsteadiness on feet  ONSET DATE: 01/04/23  Rationale for Evaluation and Treatment: Rehabilitation  SUBJECTIVE:   SUBJECTIVE STATEMENT: Has MRI scheduled on the 21st.  Feels symptoms are better; not spinning around.  Still having some lightheadedness when up and walking around.  Isn't has consistent with HEP as she should be.  Pt accompanied by: self  PERTINENT HISTORY: arthritis, breast cancer, hearing loss, HTN, osteopenia, PNA  PAIN:  Are you having pain? No  PRECAUTIONS: Fall  WEIGHT BEARING RESTRICTIONS: No  FALLS: Has patient fallen in last 6 months? No  LIVING  ENVIRONMENT: Lives with: lives with their spouse Lives in: House/apartment  PLOF: Independent and Leisure: spend time with family; does walk occasionally  PATIENT GOALS: improve dizziness  OBJECTIVE:   DIAGNOSTIC FINDINGS: MRI if not improved  COGNITION: Overall cognitive status: Within functional limits for tasks assessed   SENSATION: WFL  GAIT: 02/04/23 Gait pattern: wide BOS Distance walked: 100' Assistive device utilized: None Level of assistance: Complete Independence Comments: mildly wide BOS otherwise no significant deviations noted  BALANCE Unable to maintain balance on compliant surface with eyes closed; LOB posteriorly   VESTIBULAR ASSESSMENT:  GENERAL OBSERVATION: Reports she feels "a little funny" currently; rated 4/10; up to 7-8/10   SYMPTOM BEHAVIOR: Subjective history: see subjective Non-Vestibular symptoms:  none Type of dizziness: Imbalance (Disequilibrium), Lightheadedness/Faint, and "Funny feeling in the head" Frequency: constant Duration: constant Aggravating factors: Induced by position change: returning upright from bending forward and Induced by motion: turning body quickly and turning head quickly Relieving factors:  sitting and resting Progression of symptoms: better  OCULOMOTOR EXAM: Ocular Alignment: normal Ocular ROM: intact Spontaneous Nystagmus: absent Gaze-Induced Nystagmus: absent Smooth Pursuits: intact Saccades: intact and mild symptoms   VESTIBULAR - OCULAR REFLEX:  Slow VOR: Normal Head-Impulse Test: HIT Right: negative HIT Left: negative    POSITIONAL TESTING: Right Dix-Hallpike: no nystagmus Left Dix-Hallpike: no nystagmus Right Roll Test: no nystagmus Left Roll Test: no nystagmus   OTHOSTATICS:  02/04/23 Supine: 152/80 with HR 63  Standing: 166/85 with HR 67;  Standing x 3 min: 170/85 with HR 65  02/17/23 Supine: 138/78 with HR 62  Standing: 146/82 with HR 59;  Standing x 3 min: 145/83 with HR 62   VESTIBULAR  TREATMENT:                                                                                                   DATE:  02/17/23 Reassessed orthostatics - see above Standing gaze x 1 with full field stimulus x 30 sec horizontal/vertical Walking gaze x 1 horizontal and vertical 2x30' each; increased sway and staggering Walking with head turns: horizontal and vertical 2x30' each with increased sway and deviation > 8 in out of path Standing on compliant surface: EC static and EC with horizontal and vertical head turns: LOB x 1  02/04/23 Gaze Adaptation: x1 Viewing Horizontal: Position: sitting, Time: 30 sec, and Comment: demonstrated for HEP and x1 Viewing Vertical:  Position: sitting, Time: 30 sec, and Comment: demonstrated for HEP  Other: Corner balance exercises on compliant surface: feet together and eyes  closed; feet apart, eyes closed and head turns  PATIENT EDUCATION: Education details: HEP, clinical findings Person educated: Patient Education method: Explanation, Demonstration, and Handouts Education comprehension: verbalized understanding, returned demonstration, and needs further education  HOME EXERCISE PROGRAM: Access Code: [email or web address] URL: [email or web address] Date: 02/04/2023 Prepared by: Moshe Cipro  Exercises - Seated Gaze Stabilization with Head Rotation  - 2 x daily - 7 x weekly - 1 sets - 30 seconds - Seated Gaze Stabilization with Head Nod  - 2 x daily - 7 x weekly - 1 sets - 30 seconds - Corner Balance Feet Together With Eyes Closed  - 2 x daily - 7 x weekly - 1 sets - 5 reps - 10 sec hold - Corner Balance Feet Apart: Eyes Closed With Head Turns  - 2 x daily - 7 x weekly - 1 sets - 10 reps  Patient Education - Gaze Stabilization  GOALS: Goals reviewed with patient? Yes  SHORT TERM GOALS: Target date: 03/04/2023  Independent with HEP Goal status: INITIAL   LONG TERM GOALS: Target date: 04/01/2023  Report 50% reduction in symptoms with  positional changes Goal status: INITIAL  2.  Independent with final HEP  Goal status: INITIAL  3.  Demonstrate improved balance by standing on compliant surface with feet together and eyes closed at least 20 sec Goal status: INITIAL   ASSESSMENT:  CLINICAL IMPRESSION: Orthostatic hypotension negative again today so feel this is unlikely cause of symptoms.  At this time she denies any true spinning and no symptoms with lying down or rolling over so BPPV unlikely as well.  Still has difficulty with dynamic gait activities and when needing increased vestibular input so plan to continue focus on this.  She has an MRI scheduled to rule out a CVA.  Will continue to benefit from PT to maximize function.  OBJECTIVE IMPAIRMENTS: Abnormal gait, decreased balance, decreased coordination, decreased mobility, and dizziness.   ACTIVITY LIMITATIONS: lifting, bending, standing, squatting, and locomotion level  PARTICIPATION LIMITATIONS: meal prep, cleaning, laundry, and community activity  PERSONAL FACTORS: 3+ comorbidities: arthritis, breast cancer, hearing loss, HTN, osteopenia, PNA  are also affecting patient's functional outcome.   REHAB POTENTIAL: Good  CLINICAL DECISION MAKING: Evolving/moderate complexity  EVALUATION COMPLEXITY: Moderate   PLAN:  PT FREQUENCY: 1x/week; plan to see biweekly but will see up to 1x/wk PRN  PT DURATION: 8 weeks  PLANNED INTERVENTIONS: Therapeutic exercises, Therapeutic activity, Neuromuscular re-education, Balance training, Gait training, Patient/Family education, Self Care, Vestibular training, Canalith repositioning, Aquatic Therapy, Cryotherapy, Moist heat, Manual therapy, and Re-evaluation  PLAN FOR NEXT SESSION: review updated HEP, see what MRI shows, continue vestibular exercises   Clarita Crane, PT, DPT 02/17/23 2:03 PM   PHYSICAL THERAPY DISCHARGE SUMMARY  Visits from Start of Care: 2  Current functional level related to goals /  functional outcomes: See above   Remaining deficits: See above   Education / Equipment: HEP   Patient agrees to discharge. Patient goals were not met. Patient is being discharged due to  a change in medical status based on MRI findings.  Clarita Crane, PT, DPT 04/20/23 3:41 PM  [facility] [address] [place], [place], [address] Phone: [phone]   Fax:  [phone]

## 2023-03-03 ENCOUNTER — Ambulatory Visit
Admission: RE | Admit: 2023-03-03 | Discharge: 2023-03-03 | Disposition: A | Discharge status: Home / Self Care | Payer: Medicare HMO | Source: Ambulatory Visit | Attending: Family Medicine | Admitting: Family Medicine

## 2023-03-03 DIAGNOSIS — G319 Degenerative disease of nervous system, unspecified: Secondary | ICD-10-CM | POA: Diagnosis not present

## 2023-03-03 DIAGNOSIS — R270 Ataxia, unspecified: Secondary | ICD-10-CM

## 2023-03-03 MED ORDER — GADOPICLENOL 0.5 MMOL/ML IV SOLN
9.0000 mL | Freq: Once | INTRAVENOUS | Status: AC | PRN
  Administered 2023-03-03: 9 mL via INTRAVENOUS

## 2023-03-04 ENCOUNTER — Telehealth: Payer: Self-pay | Admitting: Family Medicine

## 2023-03-04 ENCOUNTER — Telehealth: Payer: Self-pay

## 2023-03-04 NOTE — Telephone Encounter (Signed)
Patient called to follow up on MRI results; requesting for provider to return her call.  Please advise at [phone].

## 2023-03-04 NOTE — Telephone Encounter (Signed)
Alexandra Calderon from [facility] called with MRI report. Impression below:  IMPRESSION: 1. 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 2. Moderate chronic small vessel ischemic changes within the cerebral white matter 3. 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 4. Severe left sphenoid sinusitis.

## 2023-03-05 ENCOUNTER — Other Ambulatory Visit: Payer: Self-pay

## 2023-03-05 ENCOUNTER — Encounter: Payer: Medicare HMO | Admitting: Physical Therapy

## 2023-03-05 ENCOUNTER — Other Ambulatory Visit: Payer: Self-pay | Admitting: Family Medicine

## 2023-03-05 DIAGNOSIS — D333 Benign neoplasm of cranial nerves: Secondary | ICD-10-CM

## 2023-03-10 ENCOUNTER — Ambulatory Visit: Payer: Medicare HMO | Admitting: Family Medicine

## 2023-03-23 DIAGNOSIS — J323 Chronic sphenoidal sinusitis: Secondary | ICD-10-CM | POA: Insufficient documentation

## 2023-03-23 DIAGNOSIS — H903 Sensorineural hearing loss, bilateral: Secondary | ICD-10-CM | POA: Insufficient documentation

## 2023-03-23 DIAGNOSIS — H9313 Tinnitus, bilateral: Secondary | ICD-10-CM | POA: Diagnosis not present

## 2023-03-23 DIAGNOSIS — D333 Benign neoplasm of cranial nerves: Secondary | ICD-10-CM | POA: Diagnosis not present

## 2023-03-26 DIAGNOSIS — H903 Sensorineural hearing loss, bilateral: Secondary | ICD-10-CM | POA: Diagnosis not present

## 2023-04-07 DIAGNOSIS — R269 Unspecified abnormalities of gait and mobility: Secondary | ICD-10-CM | POA: Diagnosis not present

## 2023-04-07 DIAGNOSIS — H903 Sensorineural hearing loss, bilateral: Secondary | ICD-10-CM | POA: Diagnosis not present

## 2023-04-07 DIAGNOSIS — R2689 Other abnormalities of gait and mobility: Secondary | ICD-10-CM | POA: Diagnosis not present

## 2023-04-07 DIAGNOSIS — D3610 Benign neoplasm of peripheral nerves and autonomic nervous system, unspecified: Secondary | ICD-10-CM | POA: Diagnosis not present

## 2023-04-07 DIAGNOSIS — D333 Benign neoplasm of cranial nerves: Secondary | ICD-10-CM | POA: Diagnosis not present

## 2023-04-07 DIAGNOSIS — H9313 Tinnitus, bilateral: Secondary | ICD-10-CM | POA: Diagnosis not present

## 2023-04-09 DIAGNOSIS — Z923 Personal history of irradiation: Secondary | ICD-10-CM | POA: Diagnosis not present

## 2023-04-09 DIAGNOSIS — H903 Sensorineural hearing loss, bilateral: Secondary | ICD-10-CM | POA: Diagnosis not present

## 2023-04-09 DIAGNOSIS — Z853 Personal history of malignant neoplasm of breast: Secondary | ICD-10-CM | POA: Diagnosis not present

## 2023-04-09 DIAGNOSIS — D333 Benign neoplasm of cranial nerves: Secondary | ICD-10-CM | POA: Diagnosis not present

## 2023-04-09 DIAGNOSIS — Z9221 Personal history of antineoplastic chemotherapy: Secondary | ICD-10-CM | POA: Diagnosis not present

## 2023-04-09 DIAGNOSIS — F40240 Claustrophobia: Secondary | ICD-10-CM | POA: Diagnosis not present

## 2023-04-19 ENCOUNTER — Other Ambulatory Visit: Payer: Self-pay | Admitting: Cardiovascular Disease

## 2023-04-19 ENCOUNTER — Other Ambulatory Visit: Payer: Self-pay | Admitting: Family Medicine

## 2023-04-19 DIAGNOSIS — I10 Essential (primary) hypertension: Secondary | ICD-10-CM

## 2023-04-20 NOTE — Telephone Encounter (Signed)
OV 02/03/23 Requested Prescriptions  Pending Prescriptions Disp Refills   omeprazole (PRILOSEC) 40 MG capsule [Pharmacy Med Name: OMEPRAZOLE DR 40 MG CAPSULE] 90 capsule 0    Sig: TAKE 1 CAPSULE BY MOUTH EVERY DAY     Gastroenterology: Proton Pump Inhibitors Failed - 04/19/2023  8:50 PM      Failed - Valid encounter within last 12 months    Recent Outpatient Visits           1 year ago Chronic sore throat   [facility] Donita Brooks, MD   1 year ago Dysuria   [facility] Donita Brooks, MD   1 year ago Need for immunization against influenza   [facility] Tanya Nones Priscille Heidelberg, MD   1 year ago Dysuria   [facility] Donita Brooks, MD   1 year ago COVID-19   [facility] Valentino Nose, NP

## 2023-04-30 ENCOUNTER — Encounter: Payer: Self-pay | Admitting: Pharmacist

## 2023-04-30 NOTE — Progress Notes (Signed)
Patient previously followed by [facility] pharmacist. Per clinical review, no pharmacist appointment needed at this time.

## 2023-05-05 DIAGNOSIS — R2689 Other abnormalities of gait and mobility: Secondary | ICD-10-CM | POA: Insufficient documentation

## 2023-05-05 DIAGNOSIS — D333 Benign neoplasm of cranial nerves: Secondary | ICD-10-CM | POA: Diagnosis not present

## 2023-05-07 ENCOUNTER — Encounter: Payer: Medicare HMO | Admitting: Pharmacist

## 2023-05-18 ENCOUNTER — Ambulatory Visit: Payer: Medicare HMO | Admitting: Family Medicine

## 2023-05-19 ENCOUNTER — Encounter: Payer: Self-pay | Admitting: Family Medicine

## 2023-05-19 ENCOUNTER — Ambulatory Visit (INDEPENDENT_AMBULATORY_CARE_PROVIDER_SITE_OTHER): Payer: Medicare HMO | Admitting: Family Medicine

## 2023-05-19 VITALS — BP 118/68 | HR 64 | Temp 97.6°F | Ht 66.0 in | Wt 184.0 lb

## 2023-05-19 DIAGNOSIS — J323 Chronic sphenoidal sinusitis: Secondary | ICD-10-CM | POA: Diagnosis not present

## 2023-05-19 DIAGNOSIS — D3611 Benign neoplasm of peripheral nerves and autonomic nervous system of face, head, and neck: Secondary | ICD-10-CM

## 2023-05-19 MED ORDER — AMOXICILLIN-POT CLAVULANATE 875-125 MG PO TABS: 1.0000 | ORAL_TABLET | Freq: Two times a day (BID) | ORAL | 0 refills | Status: DC

## 2023-05-19 NOTE — Progress Notes (Signed)
Subjective:    Patient ID: Alexandra Calderon, female    DOB: 14-Feb-1941, 82 y.o.   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  Patient is scheduled to have radiation therapy in September.  However she was also found to have coincidental finding of near complete opacification of sphenoid sinus.  This was felt to be due to secretions.   IMPRESSION: 1. 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 2. Moderate chronic small vessel ischemic changes within the cerebral white matter 3. 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 4. Severe left sphenoid sinusitis.   Patient is now having sinus drainage postnasal drip.  Currently during provide patient requesting treatment for the sinus infection.  She denies any headaches although she continues to have constant dizziness.  Past Medical History:  Diagnosis Date   Allergy    Arthritis    Breast cancer (HCC)    right   Cataract    bilateral   Colon adenomas 2003,2006   Constipation    Diverticulosis    Dysrhythmia    Esophagitis    Fatty liver    GERD (gastroesophageal reflux disease)    Hearing loss    History of kidney stones    Hyperlipidemia    Hypertension    Irritable bowel syndrome 08/27/2012   Kidney stones    Obstructive sleep apnea (adult) (pediatric) 02/03/2023   Osteopenia    Palpitations    Personal history of radiation therapy    Pneumonia    Polyp, stomach    PVC (premature ventricular contraction)    PVC (premature ventricular contraction)    UTI (lower urinary tract infection)    Past Surgical History:  Procedure Laterality Date   BREAST LUMPECTOMY Right 07/01/11   w/sentinal node bx - right side   COLONOSCOPY W/ BIOPSIES     DILATION AND CURETTAGE OF UTERUS     ESOPHAGOGASTRODUODENOSCOPY     kidney stones      LAPAROSCOPIC CHOLECYSTECTOMY SINGLE SITE WITH INTRAOPERATIVE CHOLANGIOGRAM N/A 04/25/2021   Procedure: LAPAROSCOPIC CHOLECYSTECTOMY SINGLE SITE WITH INTRAOPERATIVE CHOLANGIOGRAM, POSSIBLE NEEDLE CORE LIVER BX;  Surgeon: Karie Soda, MD;  Location: [facility];  Service: General;  Laterality: N/A;   shoulder arthroscopy rt 2009     SHOULDER SURGERY     Current Outpatient Medications on File Prior to Visit  Medication Sig Dispense Refill   alendronate (FOSAMAX) 70 MG tablet TAKE 1 TABLET BY MOUTH EVERY 7 DAYS WITH A FULL GLASS OF WATER ON AN EMPTY STOMACH. 12 tablet 3   apixaban (ELIQUIS) 5 MG TABS tablet Take 1 tablet (5 mg total) by mouth 2 (two) times daily. 60 tablet 6   atenolol (TENORMIN) 50 MG tablet TAKE 1 TABLET BY MOUTH TWICE A DAY 180 tablet 3   Cholecalciferol (VITAMIN D) 50 MCG (2000 UT) CAPS Take 2,000 Units by mouth daily.     diltiazem (CARDIZEM) 30 MG tablet Take 1 tablet every 4 hours AS NEEDED for heart rate >100 30 tablet 4   Docusate Sodium (DSS) 100 MG CAPS Take 1 capsule by mouth daily.     losartan (COZAAR) 25 MG tablet TAKE 1 TABLET BY MOUTH EVERY DAY 90 tablet 2   omeprazole (PRILOSEC) 40 MG capsule TAKE 1 CAPSULE BY MOUTH EVERY DAY 90 capsule 0   Probiotic Product (PROBIOTIC DAILY PO) Take 1  tablet by mouth every morning.     rosuvastatin (CRESTOR) 5 MG tablet TAKE 1 TABLET BY MOUTH EVERY DAY 90 tablet 3   Current Facility-Administered Medications on File Prior to Visit  Medication Dose Route Frequency Provider Last Rate Last Admin   0.9 %  sodium chloride infusion  500 mL Intravenous Once Iva Boop, MD       Allergies  Allergen Reactions   Gatifloxacin     unknown   Nitrofurantoin Macrocrystal     unknown   Azithromycin Rash   Levofloxacin Rash    REACTION: rash    Sulfamethoxazole-Trimethoprim Rash and Other (See Comments)    Chills, fever   Sulfonamide Derivatives Rash and Other (See Comments)    Chills, fever   Social History   Socioeconomic  History   Marital status: Married    Spouse name: Not on file   Number of children: 2   Years of education: Not on file   Highest education level: Not on file  Occupational History   Occupation: retired  Tobacco Use   Smoking status: Never   Smokeless tobacco: Never   Tobacco comments:    Never smoke 12/25/21  Vaping Use   Vaping status: Never Used  Substance and Sexual Activity   Alcohol use: No   Drug use: No   Sexual activity: Yes  Other Topics Concern   Not on file  Social History Narrative   Spouse:  [name]   Children:   [name]- age 80- [place]   Ulis Rias- age 73- [place]   Social Determinants of Health   Financial Resource Strain: Low Risk  (01/04/2023)   Overall Financial Resource Strain (CARDIA)    Difficulty of Paying Living Expenses: Not hard at all  Food Insecurity: Low Risk  (04/09/2023)   [facility]   Food vital sign    Within the past 12 months, you worried that your food would run out before you got money to buy more: Never true    Within the past 12 months, the food you bought just didn't last and you didn't have money to get more. : Never true  Transportation Needs: Not on file (04/09/2023)  Physical Activity: Unknown (01/04/2023)   Exercise Vital Sign    Days of Exercise per Week: Patient declined    Minutes of Exercise per Session: Not on file  Stress: Patient Declined (01/04/2023)   [employer] of Occupational Health - Occupational Stress Questionnaire    Feeling of Stress : Patient declined  Social Connections: Unknown (01/04/2023)   Social Connection and Isolation Panel [NHANES]    Frequency of Communication with Friends and Family: Patient declined    Frequency of Social Gatherings with Friends and Family: Patient declined    Attends Religious Services: Patient declined    Database administrator or Organizations: Yes    Attends Banker Meetings: Patient declined    Marital Status:  Married  Catering manager Violence: Not on file    Review of Systems  All other systems reviewed and are negative.      Objective:   Physical Exam Constitutional:      General: She is not in acute distress.    Appearance: Normal appearance. She is normal weight. She is not ill-appearing, toxic-appearing or diaphoretic.  HENT:     Right Ear: Tympanic membrane and ear canal normal.     Left Ear: Tympanic membrane and ear canal normal.  Nose: Congestion and rhinorrhea present.     Mouth/Throat:     Mouth: Mucous membranes are moist. No oral lesions or angioedema.     Dentition: No gingival swelling or gum lesions.     Tongue: No lesions.     Tonsils: No tonsillar exudate or tonsillar abscesses.  Neck:     Thyroid: No thyroid mass.  Cardiovascular:     Rate and Rhythm: Normal rate and regular rhythm.     Pulses: Normal pulses.     Heart sounds: Normal heart sounds. No murmur heard.    No friction rub. No gallop.  Pulmonary:     Effort: Pulmonary effort is normal. No respiratory distress.     Breath sounds: Normal breath sounds. No stridor.  Musculoskeletal:     Cervical back: Normal range of motion and neck supple. No pain with movement.  Lymphadenopathy:     Cervical:     Right cervical: No superficial or deep cervical adenopathy.    Left cervical: No superficial or deep cervical adenopathy.  Neurological:     Mental Status: She is alert.           Assessment & Plan:  Chronic sphenoidal sinusitis We will try Augmentin 875 mg twice daily for 14 days and then reassess.

## 2023-05-25 ENCOUNTER — Ambulatory Visit (INDEPENDENT_AMBULATORY_CARE_PROVIDER_SITE_OTHER): Payer: Medicare HMO | Admitting: Family Medicine

## 2023-05-25 ENCOUNTER — Encounter: Payer: Self-pay | Admitting: Family Medicine

## 2023-05-25 VITALS — BP 124/72 | HR 66 | Temp 98.3°F | Ht 66.0 in | Wt 184.0 lb

## 2023-05-25 DIAGNOSIS — K209 Esophagitis, unspecified without bleeding: Secondary | ICD-10-CM

## 2023-05-25 MED ORDER — SUCRALFATE 1 GM/10ML PO SUSP: 1.0000 g | Freq: Three times a day (TID) | ORAL | 0 refills | Status: DC

## 2023-05-25 NOTE — Progress Notes (Signed)
Subjective:    Patient ID: Alexandra Calderon, female    DOB: May 09, 1941, 82 y.o.   MRN: [id number] The patient recently took Fosamax.  She developed a burning pain in her esophagus.  She states that she feels a burning pain in her epigastric area that radiates up her throat into her esophagus.  It also radiates into her back.  She denies any melena or hematochezia.  She quit taking Fosamax and has not taken it since.  She is not taking any NSAIDs although she is on Eliquis.  She denies any chest pain or shortness of breath.  She is on omeprazole 40 mg a day Past Medical History:  Diagnosis Date   Allergy    Arthritis    Breast cancer (HCC)    right   Cataract    bilateral   Colon adenomas 2003,2006   Constipation    Diverticulosis    Dysrhythmia    Esophagitis    Fatty liver    GERD (gastroesophageal reflux disease)    Hearing loss    History of kidney stones    Hyperlipidemia    Hypertension    Irritable bowel syndrome 08/27/2012   Kidney stones    Obstructive sleep apnea (adult) (pediatric) 02/03/2023   Osteopenia    Palpitations    Personal history of radiation therapy    Pneumonia    Polyp, stomach    PVC (premature ventricular contraction)    PVC (premature ventricular contraction)    Schwannoma of nerve of head    UTI (lower urinary tract infection)    Past Surgical History:  Procedure Laterality Date   BREAST LUMPECTOMY Right 07/01/11   w/sentinal node bx - right side   COLONOSCOPY W/ BIOPSIES     DILATION AND CURETTAGE OF UTERUS     ESOPHAGOGASTRODUODENOSCOPY     kidney stones     LAPAROSCOPIC CHOLECYSTECTOMY SINGLE SITE WITH INTRAOPERATIVE CHOLANGIOGRAM N/A 04/25/2021   Procedure: LAPAROSCOPIC CHOLECYSTECTOMY SINGLE SITE WITH INTRAOPERATIVE CHOLANGIOGRAM, POSSIBLE NEEDLE CORE LIVER BX;  Surgeon: Karie Soda, MD;  Location: [facility];  Service: General;  Laterality: N/A;   shoulder arthroscopy rt 2009     SHOULDER SURGERY     Current Outpatient Medications on File  Prior to Visit  Medication Sig Dispense Refill   alendronate (FOSAMAX) 70 MG tablet TAKE 1 TABLET BY MOUTH EVERY 7 DAYS WITH A FULL GLASS OF WATER ON AN EMPTY STOMACH. 12 tablet 3   amoxicillin-clavulanate (AUGMENTIN) 875-125 MG tablet Take 1 tablet by mouth 2 (two) times daily. 28 tablet 0   apixaban (ELIQUIS) 5 MG TABS tablet Take 1 tablet (5 mg total) by mouth 2 (two) times daily. 60 tablet 6   atenolol (TENORMIN) 50 MG tablet TAKE 1 TABLET BY MOUTH TWICE A DAY 180 tablet 3   Cholecalciferol (VITAMIN D) 50 MCG (2000 UT) CAPS Take 2,000 Units by mouth daily.     diltiazem (CARDIZEM) 30 MG tablet Take 1 tablet every 4 hours AS NEEDED for heart rate >100 30 tablet 4   Docusate Sodium (DSS) 100 MG CAPS Take 1 capsule by mouth daily.     losartan (COZAAR) 25 MG tablet TAKE 1 TABLET BY MOUTH EVERY DAY 90 tablet 2   omeprazole (PRILOSEC) 40 MG capsule TAKE 1 CAPSULE BY MOUTH EVERY DAY 90 capsule 0   Probiotic Product (PROBIOTIC DAILY PO) Take 1 tablet by mouth every morning.     rosuvastatin (CRESTOR) 5 MG tablet TAKE 1 TABLET BY MOUTH EVERY DAY 90  tablet 3   Current Facility-Administered Medications on File Prior to Visit  Medication Dose Route Frequency Provider Last Rate Last Admin   0.9 %  sodium chloride infusion  500 mL Intravenous Once Iva Boop, MD       Allergies  Allergen Reactions   Gatifloxacin     unknown   Nitrofurantoin Macrocrystal     unknown   Azithromycin Rash   Levofloxacin Rash    REACTION: rash    Sulfamethoxazole-Trimethoprim Rash and Other (See Comments)    Chills, fever   Sulfonamide Derivatives Rash and Other (See Comments)    Chills, fever   Social History   Socioeconomic History   Marital status: Married    Spouse name: Not on file   Number of children: 2   Years of education: Not on file   Highest education level: Not on file  Occupational History   Occupation: retired  Tobacco Use   Smoking status: Never   Smokeless tobacco: Never    Tobacco comments:    Never smoke 12/25/21  Vaping Use   Vaping status: Never Used  Substance and Sexual Activity   Alcohol use: No   Drug use: No   Sexual activity: Yes  Other Topics Concern   Not on file  Social History Narrative   Spouse:  [name]   Children:   [name]- age 89- [place]   Ulis Rias- age 49-    Social Determinants of Health   Financial Resource Strain: Low Risk  (01/04/2023)   Overall Financial Resource Strain (CARDIA)    Difficulty of Paying Living Expenses: Not hard at all  Food Insecurity: Low Risk  (04/09/2023)   [facility]   Food vital sign    Within the past 12 months, you worried that your food would run out before you got money to buy more: Never true    Within the past 12 months, the food you bought just didn't last and you didn't have money to get more. : Never true  Transportation Needs: Not on file (04/09/2023)  Physical Activity: Unknown (01/04/2023)   Exercise Vital Sign    Days of Exercise per Week: Patient declined    Minutes of Exercise per Session: Not on file  Stress: Patient Declined (01/04/2023)   [employer] of Occupational Health - Occupational Stress Questionnaire    Feeling of Stress : Patient declined  Social Connections: Unknown (01/04/2023)   Social Connection and Isolation Panel [NHANES]    Frequency of Communication with Friends and Family: Patient declined    Frequency of Social Gatherings with Friends and Family: Patient declined    Attends Religious Services: Patient declined    Database administrator or Organizations: Yes    Attends Banker Meetings: Patient declined    Marital Status: Married  Catering manager Violence: Not on file    Review of Systems  All other systems reviewed and are negative.      Objective:   Physical Exam Constitutional:      General: She is not in acute distress.    Appearance: Normal appearance. She is normal weight. She  is not ill-appearing, toxic-appearing or diaphoretic.  HENT:     Mouth/Throat:     Mouth: No oral lesions or angioedema.     Dentition: No gingival swelling or gum lesions.     Tongue: No lesions.     Tonsils: No tonsillar exudate or tonsillar abscesses.  Neck:  Thyroid: No thyroid mass.  Cardiovascular:     Rate and Rhythm: Normal rate and regular rhythm.     Pulses: Normal pulses.     Heart sounds: Normal heart sounds. No murmur heard.    No friction rub. No gallop.  Pulmonary:     Effort: Pulmonary effort is normal. No respiratory distress.     Breath sounds: Normal breath sounds. No stridor.  Abdominal:     General: Bowel sounds are normal. There is no distension.     Palpations: Abdomen is soft.     Tenderness: There is no abdominal tenderness. There is no guarding.  Musculoskeletal:     Cervical back: Normal range of motion and neck supple. No pain with movement.  Lymphadenopathy:     Cervical:     Right cervical: No superficial or deep cervical adenopathy.    Left cervical: No superficial or deep cervical adenopathy.  Neurological:     Mental Status: She is alert.           Assessment & Plan:  Esophagitis I suspect esophagitis triggered by alendronate.  Discontinue alendronate.  We will revisit whether to resume bisphosphonate therapy around Christmas time.  May try Prolia instead.  Meanwhile add sucralfate 1 g p.o. q. ACH S to her omeprazole.  As long as the symptoms improve and resolve over the next 2 weeks no further workup is necessary.  If not improving would recommend EGD to evaluate further.

## 2023-06-02 DIAGNOSIS — J323 Chronic sphenoidal sinusitis: Secondary | ICD-10-CM | POA: Diagnosis not present

## 2023-06-02 DIAGNOSIS — H9313 Tinnitus, bilateral: Secondary | ICD-10-CM | POA: Diagnosis not present

## 2023-06-02 DIAGNOSIS — R42 Dizziness and giddiness: Secondary | ICD-10-CM | POA: Diagnosis not present

## 2023-06-02 DIAGNOSIS — D333 Benign neoplasm of cranial nerves: Secondary | ICD-10-CM | POA: Diagnosis not present

## 2023-06-02 DIAGNOSIS — H903 Sensorineural hearing loss, bilateral: Secondary | ICD-10-CM | POA: Diagnosis not present

## 2023-06-02 DIAGNOSIS — I6782 Cerebral ischemia: Secondary | ICD-10-CM | POA: Diagnosis not present

## 2023-06-02 DIAGNOSIS — H832X1 Labyrinthine dysfunction, right ear: Secondary | ICD-10-CM | POA: Insufficient documentation

## 2023-06-05 ENCOUNTER — Ambulatory Visit (INDEPENDENT_AMBULATORY_CARE_PROVIDER_SITE_OTHER): Payer: Medicare HMO | Admitting: Family Medicine

## 2023-06-05 VITALS — BP 130/72 | HR 63 | Temp 97.7°F | Wt 182.0 lb

## 2023-06-05 DIAGNOSIS — K219 Gastro-esophageal reflux disease without esophagitis: Secondary | ICD-10-CM

## 2023-06-05 DIAGNOSIS — R1013 Epigastric pain: Secondary | ICD-10-CM | POA: Diagnosis not present

## 2023-06-05 MED ORDER — PANTOPRAZOLE SODIUM 40 MG PO TBEC: 40.0000 mg | DELAYED_RELEASE_TABLET | Freq: Two times a day (BID) | ORAL | 3 refills | Status: DC

## 2023-06-05 MED ORDER — FAMOTIDINE 40 MG PO TABS: 40.0000 mg | ORAL_TABLET | Freq: Every day | ORAL | 1 refills | Status: DC

## 2023-06-05 NOTE — Progress Notes (Signed)
Subjective:    Patient ID: Alexandra Calderon, female    DOB: 23-Feb-1941, 82 y.o.   MRN: [id number] 05/25/23 The patient recently took Fosamax.  She developed a burning pain in her esophagus.  She states that she feels a burning pain in her epigastric area that radiates up her throat into her esophagus.  It also radiates into her back.  She denies any melena or hematochezia.  She quit taking Fosamax and has not taken it since.  She is not taking any NSAIDs although she is on Eliquis.  She denies any chest pain or shortness of breath.  She is on omeprazole 40 mg a day.  At that time, my plan was: I suspect esophagitis triggered by alendronate.  Discontinue alendronate.  We will revisit whether to resume bisphosphonate therapy around Christmas time.  May try Prolia instead.  Meanwhile add sucralfate 1 g p.o. q. ACH S to her omeprazole.  As long as the symptoms improve and resolve over the next 2 weeks no further workup is necessary.  If not improving would recommend EGD to evaluate further.  06/05/23 Patient states at best, she may be 30% better.  She continues to have breakthrough burning in the center of her chest and in her epigastric area.  Occasionally it radiates into her back.  Typically occurs more in the evening.  She has tried elevating the head of her bed but the symptoms persist.  She denies any melena or hematochezia.  She denies any hematemesis.  She denies any dysphagia. Wt Readings from Last 3 Encounters:  06/05/23 182 lb (82.6 kg)  05/25/23 184 lb (83.5 kg)  05/19/23 184 lb (83.5 kg)   She has lost 2 pounds since her last visit.  She is scheduled to have surgery to treat the schwannoma on September 5 Past Medical History:  Diagnosis Date   Allergy    Arthritis    Breast cancer [facility])    right   Cataract    bilateral   Colon adenomas 2003,2006   Constipation    Diverticulosis    Dysrhythmia    Esophagitis    Fatty liver    GERD (gastroesophageal reflux disease)    Hearing loss     History of kidney stones    Hyperlipidemia    Hypertension    Irritable bowel syndrome 08/27/2012   Kidney stones    Obstructive sleep apnea (adult) (pediatric) 02/03/2023   Osteopenia    Palpitations    Personal history of radiation therapy    Pneumonia    Polyp, stomach    PVC (premature ventricular contraction)    PVC (premature ventricular contraction)    Schwannoma of nerve of head    UTI (lower urinary tract infection)    Past Surgical History:  Procedure Laterality Date   BREAST LUMPECTOMY Right 07/01/11   w/sentinal node bx - right side   COLONOSCOPY W/ BIOPSIES     DILATION AND CURETTAGE OF UTERUS     ESOPHAGOGASTRODUODENOSCOPY     kidney stones     LAPAROSCOPIC CHOLECYSTECTOMY SINGLE SITE WITH INTRAOPERATIVE CHOLANGIOGRAM N/A 04/25/2021   Procedure: LAPAROSCOPIC CHOLECYSTECTOMY SINGLE SITE WITH INTRAOPERATIVE CHOLANGIOGRAM, POSSIBLE NEEDLE CORE LIVER BX;  Surgeon: Karie Soda, MD;  Location: [facility];  Service: General;  Laterality: N/A;   shoulder arthroscopy rt 2009     SHOULDER SURGERY     Current Outpatient Medications on File Prior to Visit  Medication Sig Dispense Refill   alendronate (FOSAMAX) 70 MG tablet TAKE 1 TABLET  BY MOUTH EVERY 7 DAYS WITH A FULL GLASS OF WATER ON AN EMPTY STOMACH. 12 tablet 3   amoxicillin-clavulanate (AUGMENTIN) 875-125 MG tablet Take 1 tablet by mouth 2 (two) times daily. 28 tablet 0   apixaban (ELIQUIS) 5 MG TABS tablet Take 1 tablet (5 mg total) by mouth 2 (two) times daily. 60 tablet 6   atenolol (TENORMIN) 50 MG tablet TAKE 1 TABLET BY MOUTH TWICE A DAY 180 tablet 3   Cholecalciferol (VITAMIN D) 50 MCG (2000 UT) CAPS Take 2,000 Units by mouth daily.     diltiazem (CARDIZEM) 30 MG tablet Take 1 tablet every 4 hours AS NEEDED for heart rate >100 30 tablet 4   Docusate Sodium (DSS) 100 MG CAPS Take 1 capsule by mouth daily.     losartan (COZAAR) 25 MG tablet TAKE 1 TABLET BY MOUTH EVERY DAY 90 tablet 2   omeprazole (PRILOSEC) 40 MG  capsule TAKE 1 CAPSULE BY MOUTH EVERY DAY 90 capsule 0   Probiotic Product (PROBIOTIC DAILY PO) Take 1 tablet by mouth every morning.     rosuvastatin (CRESTOR) 5 MG tablet TAKE 1 TABLET BY MOUTH EVERY DAY 90 tablet 3   sucralfate (CARAFATE) 1 GM/10ML suspension Take 10 mLs (1 g total) by mouth 4 (four) times daily -  with meals and at bedtime. 420 mL 0   Current Facility-Administered Medications on File Prior to Visit  Medication Dose Route Frequency Provider Last Rate Last Admin   0.9 %  sodium chloride infusion  500 mL Intravenous Once Iva Boop, MD       Allergies  Allergen Reactions   Gatifloxacin     unknown   Nitrofurantoin Macrocrystal     unknown   Azithromycin Rash   Levofloxacin Rash    REACTION: rash    Sulfamethoxazole-Trimethoprim Rash and Other (See Comments)    Chills, fever   Sulfonamide Derivatives Rash and Other (See Comments)    Chills, fever   Social History   Socioeconomic History   Marital status: Married    Spouse name: Not on file   Number of children: 2   Years of education: Not on file   Highest education level: Not on file  Occupational History   Occupation: retired  Tobacco Use   Smoking status: Never   Smokeless tobacco: Never   Tobacco comments:    Never smoke 12/25/21  Vaping Use   Vaping status: Never Used  Substance and Sexual Activity   Alcohol use: No   Drug use: No   Sexual activity: Yes  Other Topics Concern   Not on file  Social History Narrative   Spouse:  [name]   Children:   [name]- age 60- [place]   Ulis Rias- age 53- [place]   Social Determinants of Health   Financial Resource Strain: Low Risk  (01/04/2023)   Overall Financial Resource Strain (CARDIA)    Difficulty of Paying Living Expenses: Not hard at all  Food Insecurity: Low Risk  (04/09/2023)   [facility]   Food vital sign    Within the past 12 months, you worried that your food would run out before you got  money to buy more: Never true    Within the past 12 months, the food you bought just didn't last and you didn't have money to get more. : Never true  Transportation Needs: Not on file (04/09/2023)  Physical Activity: Unknown (01/04/2023)   Exercise Vital Sign  Days of Exercise per Week: Patient declined    Minutes of Exercise per Session: Not on file  Stress: Patient Declined (01/04/2023)   [employer] of Occupational Health - Occupational Stress Questionnaire    Feeling of Stress : Patient declined  Social Connections: Unknown (01/04/2023)   Social Connection and Isolation Panel [NHANES]    Frequency of Communication with Friends and Family: Patient declined    Frequency of Social Gatherings with Friends and Family: Patient declined    Attends Religious Services: Patient declined    Database administrator or Organizations: Yes    Attends Banker Meetings: Patient declined    Marital Status: Married  Catering manager Violence: Not on file    Review of Systems  All other systems reviewed and are negative.      Objective:   Physical Exam Constitutional:      General: She is not in acute distress.    Appearance: Normal appearance. She is normal weight. She is not ill-appearing, toxic-appearing or diaphoretic.  HENT:     Mouth/Throat:     Mouth: No oral lesions or angioedema.     Dentition: No gingival swelling or gum lesions.     Tongue: No lesions.     Tonsils: No tonsillar exudate or tonsillar abscesses.  Neck:     Thyroid: No thyroid mass.  Cardiovascular:     Rate and Rhythm: Normal rate and regular rhythm.     Pulses: Normal pulses.     Heart sounds: Normal heart sounds. No murmur heard.    No friction rub. No gallop.  Pulmonary:     Effort: Pulmonary effort is normal. No respiratory distress.     Breath sounds: Normal breath sounds. No stridor.  Abdominal:     General: Bowel sounds are normal. There is no distension.     Palpations: Abdomen is  soft.     Tenderness: There is no abdominal tenderness. There is no guarding.  Musculoskeletal:     Cervical back: Normal range of motion and neck supple. No pain with movement.  Lymphadenopathy:     Cervical:     Right cervical: No superficial or deep cervical adenopathy.    Left cervical: No superficial or deep cervical adenopathy.  Neurological:     Mental Status: She is alert.           Assessment & Plan:  Epigastric pain - Plan: CBC with Differential/Platelet, COMPLETE METABOLIC PANEL WITH GFR, Lipase  Gastroesophageal reflux disease, unspecified whether esophagitis present - Plan: Ambulatory referral to Gastroenterology I believe the patient is dealing with refractory GERD.  Discontinue omeprazole.  Replace with Protonix 40 mg twice daily to get better 24-hour coverage and add Pepcid 40 mg at night.  Allow 2 weeks to work I suspect the symptoms will improve by the time of her surgery.  Rule out other potential causes by obtaining a CBC a CMP and a lipase.  If lab abnormalities exist, may need to get CT scan of the abdomen and pelvis.  Consult GI.  If symptoms or not improving she will need an EGD.

## 2023-06-06 LAB — COMPLETE METABOLIC PANEL WITHOUT GFR
AG Ratio: 1.4 (calc) (ref 1.0–2.5)
ALT: 14 U/L (ref 6–29)
AST: 15 U/L (ref 10–35)
Albumin: 3.9 g/dL (ref 3.6–5.1)
Alkaline phosphatase (APISO): 67 U/L (ref 37–153)
BUN: 19 mg/dL (ref 7–25)
CO2: 31 mmol/L (ref 20–32)
Calcium: 9.1 mg/dL (ref 8.6–10.4)
Chloride: 106 mmol/L (ref 98–110)
Creat: 0.92 mg/dL (ref 0.60–0.95)
Globulin: 2.8 g/dL (ref 1.9–3.7)
Glucose, Bld: 127 mg/dL — ABNORMAL HIGH (ref 65–99)
Potassium: 3.7 mmol/L (ref 3.5–5.3)
Sodium: 145 mmol/L (ref 135–146)
Total Bilirubin: 0.5 mg/dL (ref 0.2–1.2)
Total Protein: 6.7 g/dL (ref 6.1–8.1)
eGFR: 63 mL/min/{1.73_m2}

## 2023-06-06 LAB — CBC WITH DIFFERENTIAL/PLATELET
Absolute Monocytes: 482 {cells}/uL (ref 200–950)
Basophils Absolute: 21 {cells}/uL (ref 0–200)
Basophils Relative: 0.4 %
Eosinophils Absolute: 53 {cells}/uL (ref 15–500)
Eosinophils Relative: 1 %
HCT: 38.7 % (ref 35.0–45.0)
Hemoglobin: 12.7 g/dL (ref 11.7–15.5)
Lymphs Abs: 1595 {cells}/uL (ref 850–3900)
MCH: 29.3 pg (ref 27.0–33.0)
MCHC: 32.8 g/dL (ref 32.0–36.0)
MCV: 89.4 fL (ref 80.0–100.0)
MPV: 11.4 fL (ref 7.5–12.5)
Monocytes Relative: 9.1 %
Neutro Abs: 3148 {cells}/uL (ref 1500–7800)
Neutrophils Relative %: 59.4 %
Platelets: 189 10*3/uL (ref 140–400)
RBC: 4.33 Million/uL (ref 3.80–5.10)
RDW: 14.1 % (ref 11.0–15.0)
Total Lymphocyte: 30.1 %
WBC: 5.3 10*3/uL (ref 3.8–10.8)

## 2023-06-06 LAB — LIPASE: Lipase: 60 U/L (ref 7–60)

## 2023-06-08 ENCOUNTER — Telehealth: Payer: Self-pay | Admitting: Family Medicine

## 2023-06-08 NOTE — Telephone Encounter (Signed)
Patient called to speak with nurse regarding lab results. Please advise at [phone].

## 2023-06-18 DIAGNOSIS — D333 Benign neoplasm of cranial nerves: Secondary | ICD-10-CM | POA: Diagnosis not present

## 2023-06-18 DIAGNOSIS — H938X2 Other specified disorders of left ear: Secondary | ICD-10-CM | POA: Diagnosis not present

## 2023-06-18 DIAGNOSIS — Z510 Encounter for antineoplastic radiation therapy: Secondary | ICD-10-CM | POA: Diagnosis not present

## 2023-06-22 ENCOUNTER — Other Ambulatory Visit: Payer: Self-pay | Admitting: Family Medicine

## 2023-06-22 DIAGNOSIS — Z1231 Encounter for screening mammogram for malignant neoplasm of breast: Secondary | ICD-10-CM

## 2023-06-27 ENCOUNTER — Other Ambulatory Visit: Payer: Self-pay | Admitting: Family Medicine

## 2023-06-29 NOTE — Telephone Encounter (Signed)
Requested medication (s) are due for refill today - yes  Requested medication (s) are on the active medication list -yes  Future visit scheduled -yes  Last refill: 06/05/23  Notes to clinic: Pharmacy request: 90 day supply- original Rx dose not cover this amount- sent for review   Requested Prescriptions  Pending Prescriptions Disp Refills   famotidine (PEPCID) 40 MG tablet [Pharmacy Med Name: FAMOTIDINE 40 MG TABLET] 90 tablet 1    Sig: TAKE 1 TABLET BY MOUTH EVERY DAY     Gastroenterology:  H2 Antagonists Failed - 06/27/2023  1:30 PM      Failed - Valid encounter within last 12 months    Recent Outpatient Visits           1 year ago Chronic sore throat   [facility] Pickard, Priscille Heidelberg, MD   1 year ago Dysuria   [facility] Tanya Nones, Priscille Heidelberg, MD   1 year ago Need for immunization against influenza   [facility] Tanya Nones, Priscille Heidelberg, MD   2 years ago Dysuria   [facility] Tanya Nones, Priscille Heidelberg, MD   2 years ago COVID-19   [facility] Valentino Nose, NP       Future Appointments             In 1 month Pickard, Priscille Heidelberg, MD [facility], [facility]   In 3 months Nahser, Deloris Ping, MD  [facility] at [facility]               Requested Prescriptions  Pending Prescriptions Disp Refills   famotidine (PEPCID) 40 MG tablet [Pharmacy Med Name: FAMOTIDINE 40 MG TABLET] 90 tablet 1    Sig: TAKE 1 TABLET BY MOUTH EVERY DAY     Gastroenterology:  H2 Antagonists Failed - 06/27/2023  1:30 PM      Failed - Valid encounter within last 12 months    Recent Outpatient Visits           1 year ago Chronic sore throat   [facility] Donita Brooks, MD   1 year ago Dysuria   [facility] Donita Brooks, MD   1 year ago Need for immunization against influenza   [facility] Donita Brooks, MD   2  years ago Dysuria   [facility] Donita Brooks, MD   2 years ago COVID-19   [facility] Valentino Nose, NP       Future Appointments             In 1 month Pickard, Priscille Heidelberg, MD [facility], [facility]   In 3 months Nahser, Deloris Ping, MD [facility]

## 2023-07-07 ENCOUNTER — Encounter: Payer: Self-pay | Admitting: Gastroenterology

## 2023-07-07 ENCOUNTER — Ambulatory Visit: Payer: Medicare HMO | Admitting: Gastroenterology

## 2023-07-07 ENCOUNTER — Encounter: Payer: Self-pay | Admitting: Internal Medicine

## 2023-07-07 VITALS — BP 126/74 | HR 85 | Ht 65.0 in | Wt 176.0 lb

## 2023-07-07 DIAGNOSIS — K219 Gastro-esophageal reflux disease without esophagitis: Secondary | ICD-10-CM

## 2023-07-07 DIAGNOSIS — R1013 Epigastric pain: Secondary | ICD-10-CM

## 2023-07-07 MED ORDER — SUCRALFATE 1 G PO TABS: 1.0000 g | ORAL_TABLET | Freq: Two times a day (BID) | ORAL | 0 refills | Status: DC

## 2023-07-07 NOTE — H&P (View-Only) (Signed)
Chief Complaint: GERD Primary GI MD: Dr. Leone Payor  HPI: 82 year old female history of paroxysmal A-fib (on Eliquis), and other medical history as listed below presents for evaluation of GERD.  Patient states since July 20 she has been dealing with burning in her epigastrium that has been worse with eating.  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  She was seen by PCP in August and at that time she was put on Protonix 40 Mg twice daily and famotidine 40 Mg at bedtime.  CBC and CMP were unrevealing.  She states this provided some improvement in addition to Mylanta as needed.  Though she states she still has breakthrough symptoms.  She does not have symptoms daily but will have it several days a week.  She does note she has lost about 10 pounds over the course of a month due to change in her diet.  She states now she eats small frequent meals throughout the day.  Tries to avoid spicy/fatty foods.  And her last meal is typically around 3:00.  Denies nocturnal symptoms.  She states she is fearful to eat dinner as she does not want to feel burning in her epigastrium afterwards.  Denies NSAID use.  She does note she had cheeseburger and fries a few days ago that made it worse. She frequently consumes little Debbie cakes that are chocolate and that can make it work as well.  Used Carafate suspension in the past with relief but unable to afford.    last colonoscopy was in 2022 with 2 diminutive polyps in sigmoid and transverse colon.  External hemorrhoids.  Past Medical History:  Diagnosis Date   Allergy    Arthritis    Benign tumor of ear canal    Breast cancer (HCC)    right   Cataract    bilateral   Colon adenomas 2003,2006   Constipation    Diverticulosis    Dysrhythmia    Esophagitis    Fatty liver    GERD (gastroesophageal reflux disease)     Hearing loss    History of kidney stones    Hyperlipidemia    Hypertension    Irritable bowel syndrome 08/27/2012   Kidney stones    Obstructive sleep apnea (adult) (pediatric) 02/03/2023   Osteopenia    Palpitations    Personal history of radiation therapy    Pneumonia    Polyp, stomach    PVC (premature ventricular contraction)    PVC (premature ventricular contraction)    Schwannoma of nerve of head    UTI (lower urinary tract infection)     Past Surgical History:  Procedure Laterality Date   ACOUSTIC NEUROMA RESECTION     BREAST LUMPECTOMY Right 07/01/2011   w/sentinal node bx - right side   COLONOSCOPY W/ BIOPSIES     DILATION AND CURETTAGE OF UTERUS     ESOPHAGOGASTRODUODENOSCOPY     kidney stones     LAPAROSCOPIC CHOLECYSTECTOMY SINGLE SITE WITH INTRAOPERATIVE CHOLANGIOGRAM N/A 04/25/2021   Procedure: LAPAROSCOPIC CHOLECYSTECTOMY SINGLE SITE WITH INTRAOPERATIVE CHOLANGIOGRAM, POSSIBLE NEEDLE CORE LIVER BX;  Surgeon: Karie Soda, MD;  Location: [facility];  Service: General;  Laterality: N/A;   shoulder arthroscopy rt 2009     SHOULDER SURGERY      Current Outpatient Medications  Medication Sig Dispense Refill   apixaban (ELIQUIS) 5 MG TABS tablet Take 1  tablet (5 mg total) by mouth 2 (two) times daily. 60 tablet 6   atenolol (TENORMIN) 50 MG tablet TAKE 1 TABLET BY MOUTH TWICE A DAY 180 tablet 3   Cholecalciferol (VITAMIN D) 50 MCG (2000 UT) CAPS Take 2,000 Units by mouth daily.     diltiazem (CARDIZEM) 30 MG tablet Take 1 tablet every 4 hours AS NEEDED for heart rate >100 30 tablet 4   Docusate Sodium (DSS) 100 MG CAPS Take 1 capsule by mouth daily.     famotidine (PEPCID) 40 MG tablet Take 1 tablet (40 mg total) by mouth daily. 30 tablet 1   losartan (COZAAR) 25 MG tablet TAKE 1 TABLET BY MOUTH EVERY DAY 90 tablet 2   pantoprazole (PROTONIX) 40 MG tablet Take 1 tablet (40 mg total) by mouth 2 (two) times daily. 60 tablet 3   Probiotic Product (PROBIOTIC DAILY PO)  Take 1 tablet by mouth every morning.     rosuvastatin (CRESTOR) 5 MG tablet TAKE 1 TABLET BY MOUTH EVERY DAY 90 tablet 3   Current Facility-Administered Medications  Medication Dose Route Frequency Provider Last Rate Last Admin   0.9 %  sodium chloride infusion  500 mL Intravenous Once Iva Boop, MD        Allergies as of 07/07/2023 - Review Complete 07/07/2023  Allergen Reaction Noted   Gatifloxacin     Nitrofurantoin macrocrystal     Azithromycin Rash 09/29/2017   Levofloxacin Rash 12/06/2009   Sulfamethoxazole-trimethoprim Rash and Other (See Comments) 12/26/2009   Sulfonamide derivatives Rash and Other (See Comments)     Family History  Problem Relation Age of Onset   Lymphoma Father    Hodgkin's lymphoma Father    Colon cancer Mother        early 46's   Diabetes Maternal Aunt    Diabetes Paternal Grandmother    Colon polyps Neg Hx    Rectal cancer Neg Hx    Stomach cancer Neg Hx    Pancreatic cancer Neg Hx    Esophageal cancer Neg Hx     Social History   Socioeconomic History   Marital status: Married    Spouse name: Not on file   Number of children: 2   Years of education: Not on file   Highest education level: Not on file  Occupational History   Occupation: retired  Tobacco Use   Smoking status: Never   Smokeless tobacco: Never   Tobacco comments:    Never smoke 12/25/21  Vaping Use   Vaping status: Never Used  Substance and Sexual Activity   Alcohol use: No   Drug use: No   Sexual activity: Yes  Other Topics Concern   Not on file  Social History Narrative   Spouse:  [name]   Children:   [name]- age 83- [place]   Ulis Rias- age 61- [place]   Social Determinants of Health   Financial Resource Strain: Low Risk  (01/04/2023)   Overall Financial Resource Strain (CARDIA)    Difficulty of Paying Living Expenses: Not hard at all  Food Insecurity: Low Risk  (04/09/2023)   [facility]   Hunger Vital  Sign    Worried About Running Out of Food in the Last Year: Never true    Ran Out of Food in the Last Year: Never true  Transportation Needs: Not on file (04/09/2023)  Physical Activity: Unknown (01/04/2023)   Exercise Vital Sign    Days of Exercise per  Week: Patient declined    Minutes of Exercise per Session: Not on file  Stress: Patient Declined (01/04/2023)   [employer] of Occupational Health - Occupational Stress Questionnaire    Feeling of Stress : Patient declined  Social Connections: Unknown (01/04/2023)   Social Connection and Isolation Panel [NHANES]    Frequency of Communication with Friends and Family: Patient declined    Frequency of Social Gatherings with Friends and Family: Patient declined    Attends Religious Services: Patient declined    Database administrator or Organizations: Yes    Attends Engineer, structural: Patient declined    Marital Status: Married  Catering manager Violence: Not on file    Review of Systems:    Constitutional: No weight loss, fever, chills, weakness or fatigue HEENT: Eyes: No change in vision               Ears, Nose, Throat:  No change in hearing or congestion Skin: No rash or itching Cardiovascular: No chest pain, chest pressure or palpitations   Respiratory: No SOB or cough Gastrointestinal: See HPI and otherwise negative Genitourinary: No dysuria or change in urinary frequency Neurological: No headache, dizziness or syncope Musculoskeletal: No new muscle or joint pain Hematologic: No bleeding or bruising Psychiatric: No history of depression or anxiety    Physical Exam:  Vital signs: BP 126/74   Pulse 85   Ht 5\' 5"  (1.651 m)   Wt 79.8 kg   LMP  (LMP Unknown)   BMI 29.29 kg/m   Constitutional: NAD, Well developed, Well nourished, alert and cooperative Head:  Normocephalic and atraumatic. Eyes:   PEERL, EOMI. No icterus. Conjunctiva pink. Respiratory: Respirations even and unlabored. Lungs clear to  auscultation bilaterally.   No wheezes, crackles, or rhonchi.  Cardiovascular:  Regular rate and rhythm. No peripheral edema, cyanosis or pallor.  Gastrointestinal:  Soft, nondistended, nontender. No rebound or guarding. Normal bowel sounds. No appreciable masses or hepatomegaly. Rectal:  Not performed.  Msk:  Symmetrical without gross deformities. Without edema, no deformity or joint abnormality.  Neurologic:  Alert and  oriented x4;  grossly normal neurologically.  Skin:   Dry and intact without significant lesions or rashes. Psychiatric: Oriented to person, place and time. Demonstrates good judgement and reason without abnormal affect or behaviors.   RELEVANT LABS AND IMAGING: CBC    Component Value Date/Time   WBC 5.3 06/05/2023 1536   RBC 4.33 06/05/2023 1536   HGB 12.7 06/05/2023 1536   HGB 13.1 02/12/2016 1316   HCT 38.7 06/05/2023 1536   HCT 39.0 02/12/2016 1316   PLT 189 06/05/2023 1536   PLT 213 02/12/2016 1316   MCV 89.4 06/05/2023 1536   MCV 86.7 02/12/2016 1316   MCH 29.3 06/05/2023 1536   MCHC 32.8 06/05/2023 1536   RDW 14.1 06/05/2023 1536   RDW 13.9 02/12/2016 1316   LYMPHSABS 1,595 06/05/2023 1536   LYMPHSABS 1.9 02/12/2016 1316   MONOABS 0.5 04/29/2017 0800   MONOABS 0.5 02/12/2016 1316   EOSABS 53 06/05/2023 1536   EOSABS 0.1 02/12/2016 1316   BASOSABS 21 06/05/2023 1536   BASOSABS 0.0 02/12/2016 1316    CMP     Component Value Date/Time   NA 145 06/05/2023 1536   NA 142 02/24/2017 1002   NA 143 02/12/2016 1316   K 3.7 06/05/2023 1536   K 3.5 02/12/2016 1316   CL 106 06/05/2023 1536   CL 99 10/07/2012 0859   CO2 31 06/05/2023 1536  CO2 30 (H) 02/12/2016 1316   GLUCOSE 127 (H) 06/05/2023 1536   GLUCOSE 95 02/12/2016 1316   GLUCOSE 114 (H) 10/07/2012 0859   BUN 19 06/05/2023 1536   BUN 14 02/24/2017 1002   BUN 14.8 02/12/2016 1316   CREATININE 0.92 06/05/2023 1536   CREATININE 1.0 02/12/2016 1316   CALCIUM 9.1 06/05/2023 1536   CALCIUM 9.3  02/12/2016 1316   PROT 6.7 06/05/2023 1536   PROT 7.2 02/12/2016 1316   ALBUMIN 3.8 04/29/2017 0800   ALBUMIN 3.7 02/12/2016 1316   AST 15 06/05/2023 1536   AST 18 02/12/2016 1316   ALT 14 06/05/2023 1536   ALT 19 02/12/2016 1316   ALKPHOS 82 04/29/2017 0800   ALKPHOS 108 02/12/2016 1316   BILITOT 0.5 06/05/2023 1536   BILITOT 0.61 02/12/2016 1316   GFRNONAA 47 (L) 05/18/2021 0937   GFRNONAA 57 (L) 03/14/2021 0958   GFRAA 66 03/14/2021 0958     Assessment/Plan:   GERD Epigastric pain New onset GERD/epigastric pain in the setting of recent stress.  No NSAIDs.  Breakthrough symptoms on PPI twice daily and famotidine at bedtime.  Discussed conservative treatment versus EGD.  Joint decision making with patient and husband ultimately led to proceeding with EGD for further evaluation  - EGD to evaluate for gastritis, esophagitis, PUD - Will get blood thinner cease from cardiologist and hold Eliquis x 2 days - I thoroughly discussed the procedure with the patient (at bedside) to include nature of the procedure, alternatives, benefits, and risks (including but not limited to bleeding, infection, perforation, anesthesia/cardiac pulmonary complications).  Patient verbalized understanding and gave verbal consent to proceed with procedure. - Carafate twice daily (warned of constipation side effects) - Educated patient on lifestyle modifications and provided patient education handout  Weight loss 10 pound weight loss over the course of a month after decreased caloric intake and smaller more frequent meals.  Suspect diet changes led to weight loss. - EGD for further evaluation - If persistent weight loss and EGD is negative we could consider CT scan  Donz[facility] 07/07/2023, 10:56 AM  Cc: Donita Brooks, MD

## 2023-07-07 NOTE — Progress Notes (Signed)
Chief Complaint: GERD Primary GI MD: Dr. Leone Payor  HPI: 82 year old female history of paroxysmal A-fib (on Eliquis), and other medical history as listed below presents for evaluation of GERD.  Patient states since July 20 she has been dealing with burning in her epigastrium that has been worse with eating.  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  She was seen by PCP in August and at that time she was put on Protonix 40 Mg twice daily and famotidine 40 Mg at bedtime.  CBC and CMP were unrevealing.  She states this provided some improvement in addition to Mylanta as needed.  Though she states she still has breakthrough symptoms.  She does not have symptoms daily but will have it several days a week.  She does note she has lost about 10 pounds over the course of a month due to change in her diet.  She states now she eats small frequent meals throughout the day.  Tries to avoid spicy/fatty foods.  And her last meal is typically around 3:00.  Denies nocturnal symptoms.  She states she is fearful to eat dinner as she does not want to feel burning in her epigastrium afterwards.  Denies NSAID use.  She does note she had cheeseburger and fries a few days ago that made it worse. She frequently consumes little Debbie cakes that are chocolate and that can make it work as well.  Used Carafate suspension in the past with relief but unable to afford.    last colonoscopy was in 2022 with 2 diminutive polyps in sigmoid and transverse colon.  External hemorrhoids.  Past Medical History:  Diagnosis Date   Allergy    Arthritis    Benign tumor of ear canal    Breast cancer (HCC)    right   Cataract    bilateral   Colon adenomas 2003,2006   Constipation    Diverticulosis    Dysrhythmia    Esophagitis    Fatty liver    GERD (gastroesophageal reflux disease)     Hearing loss    History of kidney stones    Hyperlipidemia    Hypertension    Irritable bowel syndrome 08/27/2012   Kidney stones    Obstructive sleep apnea (adult) (pediatric) 02/03/2023   Osteopenia    Palpitations    Personal history of radiation therapy    Pneumonia    Polyp, stomach    PVC (premature ventricular contraction)    PVC (premature ventricular contraction)    Schwannoma of nerve of head    UTI (lower urinary tract infection)     Past Surgical History:  Procedure Laterality Date   ACOUSTIC NEUROMA RESECTION     BREAST LUMPECTOMY Right 07/01/2011   w/sentinal node bx - right side   COLONOSCOPY W/ BIOPSIES     DILATION AND CURETTAGE OF UTERUS     ESOPHAGOGASTRODUODENOSCOPY     kidney stones     LAPAROSCOPIC CHOLECYSTECTOMY SINGLE SITE WITH INTRAOPERATIVE CHOLANGIOGRAM N/A 04/25/2021   Procedure: LAPAROSCOPIC CHOLECYSTECTOMY SINGLE SITE WITH INTRAOPERATIVE CHOLANGIOGRAM, POSSIBLE NEEDLE CORE LIVER BX;  Surgeon: Karie Soda, MD;  Location: [facility];  Service: General;  Laterality: N/A;   shoulder arthroscopy rt 2009     SHOULDER SURGERY      Current Outpatient Medications  Medication Sig Dispense Refill   apixaban (ELIQUIS) 5 MG TABS tablet Take 1  tablet (5 mg total) by mouth 2 (two) times daily. 60 tablet 6   atenolol (TENORMIN) 50 MG tablet TAKE 1 TABLET BY MOUTH TWICE A DAY 180 tablet 3   Cholecalciferol (VITAMIN D) 50 MCG (2000 UT) CAPS Take 2,000 Units by mouth daily.     diltiazem (CARDIZEM) 30 MG tablet Take 1 tablet every 4 hours AS NEEDED for heart rate >100 30 tablet 4   Docusate Sodium (DSS) 100 MG CAPS Take 1 capsule by mouth daily.     famotidine (PEPCID) 40 MG tablet Take 1 tablet (40 mg total) by mouth daily. 30 tablet 1   losartan (COZAAR) 25 MG tablet TAKE 1 TABLET BY MOUTH EVERY DAY 90 tablet 2   pantoprazole (PROTONIX) 40 MG tablet Take 1 tablet (40 mg total) by mouth 2 (two) times daily. 60 tablet 3   Probiotic Product (PROBIOTIC DAILY PO)  Take 1 tablet by mouth every morning.     rosuvastatin (CRESTOR) 5 MG tablet TAKE 1 TABLET BY MOUTH EVERY DAY 90 tablet 3   Current Facility-Administered Medications  Medication Dose Route Frequency Provider Last Rate Last Admin   0.9 %  sodium chloride infusion  500 mL Intravenous Once Iva Boop, MD        Allergies as of 07/07/2023 - Review Complete 07/07/2023  Allergen Reaction Noted   Gatifloxacin     Nitrofurantoin macrocrystal     Azithromycin Rash 09/29/2017   Levofloxacin Rash 12/06/2009   Sulfamethoxazole-trimethoprim Rash and Other (See Comments) 12/26/2009   Sulfonamide derivatives Rash and Other (See Comments)     Family History  Problem Relation Age of Onset   Lymphoma Father    Hodgkin's lymphoma Father    Colon cancer Mother        early 46's   Diabetes Maternal Aunt    Diabetes Paternal Grandmother    Colon polyps Neg Hx    Rectal cancer Neg Hx    Stomach cancer Neg Hx    Pancreatic cancer Neg Hx    Esophageal cancer Neg Hx     Social History   Socioeconomic History   Marital status: Married    Spouse name: Not on file   Number of children: 2   Years of education: Not on file   Highest education level: Not on file  Occupational History   Occupation: retired  Tobacco Use   Smoking status: Never   Smokeless tobacco: Never   Tobacco comments:    Never smoke 12/25/21  Vaping Use   Vaping status: Never Used  Substance and Sexual Activity   Alcohol use: No   Drug use: No   Sexual activity: Yes  Other Topics Concern   Not on file  Social History Narrative   Spouse:  [name]   Children:   [name]- age 83- [place]   Ulis Rias- age 61- [place]   Social Determinants of Health   Financial Resource Strain: Low Risk  (01/04/2023)   Overall Financial Resource Strain (CARDIA)    Difficulty of Paying Living Expenses: Not hard at all  Food Insecurity: Low Risk  (04/09/2023)   [facility]   Hunger Vital  Sign    Worried About Running Out of Food in the Last Year: Never true    Ran Out of Food in the Last Year: Never true  Transportation Needs: Not on file (04/09/2023)  Physical Activity: Unknown (01/04/2023)   Exercise Vital Sign    Days of Exercise per  Week: Patient declined    Minutes of Exercise per Session: Not on file  Stress: Patient Declined (01/04/2023)   [employer] of Occupational Health - Occupational Stress Questionnaire    Feeling of Stress : Patient declined  Social Connections: Unknown (01/04/2023)   Social Connection and Isolation Panel [NHANES]    Frequency of Communication with Friends and Family: Patient declined    Frequency of Social Gatherings with Friends and Family: Patient declined    Attends Religious Services: Patient declined    Database administrator or Organizations: Yes    Attends Engineer, structural: Patient declined    Marital Status: Married  Catering manager Violence: Not on file    Review of Systems:    Constitutional: No weight loss, fever, chills, weakness or fatigue HEENT: Eyes: No change in vision               Ears, Nose, Throat:  No change in hearing or congestion Skin: No rash or itching Cardiovascular: No chest pain, chest pressure or palpitations   Respiratory: No SOB or cough Gastrointestinal: See HPI and otherwise negative Genitourinary: No dysuria or change in urinary frequency Neurological: No headache, dizziness or syncope Musculoskeletal: No new muscle or joint pain Hematologic: No bleeding or bruising Psychiatric: No history of depression or anxiety    Physical Exam:  Vital signs: BP 126/74   Pulse 85   Ht 5\' 5"  (1.651 m)   Wt 79.8 kg   LMP  (LMP Unknown)   BMI 29.29 kg/m   Constitutional: NAD, Well developed, Well nourished, alert and cooperative Head:  Normocephalic and atraumatic. Eyes:   PEERL, EOMI. No icterus. Conjunctiva pink. Respiratory: Respirations even and unlabored. Lungs clear to  auscultation bilaterally.   No wheezes, crackles, or rhonchi.  Cardiovascular:  Regular rate and rhythm. No peripheral edema, cyanosis or pallor.  Gastrointestinal:  Soft, nondistended, nontender. No rebound or guarding. Normal bowel sounds. No appreciable masses or hepatomegaly. Rectal:  Not performed.  Msk:  Symmetrical without gross deformities. Without edema, no deformity or joint abnormality.  Neurologic:  Alert and  oriented x4;  grossly normal neurologically.  Skin:   Dry and intact without significant lesions or rashes. Psychiatric: Oriented to person, place and time. Demonstrates good judgement and reason without abnormal affect or behaviors.   RELEVANT LABS AND IMAGING: CBC    Component Value Date/Time   WBC 5.3 06/05/2023 1536   RBC 4.33 06/05/2023 1536   HGB 12.7 06/05/2023 1536   HGB 13.1 02/12/2016 1316   HCT 38.7 06/05/2023 1536   HCT 39.0 02/12/2016 1316   PLT 189 06/05/2023 1536   PLT 213 02/12/2016 1316   MCV 89.4 06/05/2023 1536   MCV 86.7 02/12/2016 1316   MCH 29.3 06/05/2023 1536   MCHC 32.8 06/05/2023 1536   RDW 14.1 06/05/2023 1536   RDW 13.9 02/12/2016 1316   LYMPHSABS 1,595 06/05/2023 1536   LYMPHSABS 1.9 02/12/2016 1316   MONOABS 0.5 04/29/2017 0800   MONOABS 0.5 02/12/2016 1316   EOSABS 53 06/05/2023 1536   EOSABS 0.1 02/12/2016 1316   BASOSABS 21 06/05/2023 1536   BASOSABS 0.0 02/12/2016 1316    CMP     Component Value Date/Time   NA 145 06/05/2023 1536   NA 142 02/24/2017 1002   NA 143 02/12/2016 1316   K 3.7 06/05/2023 1536   K 3.5 02/12/2016 1316   CL 106 06/05/2023 1536   CL 99 10/07/2012 0859   CO2 31 06/05/2023 1536  CO2 30 (H) 02/12/2016 1316   GLUCOSE 127 (H) 06/05/2023 1536   GLUCOSE 95 02/12/2016 1316   GLUCOSE 114 (H) 10/07/2012 0859   BUN 19 06/05/2023 1536   BUN 14 02/24/2017 1002   BUN 14.8 02/12/2016 1316   CREATININE 0.92 06/05/2023 1536   CREATININE 1.0 02/12/2016 1316   CALCIUM 9.1 06/05/2023 1536   CALCIUM 9.3  02/12/2016 1316   PROT 6.7 06/05/2023 1536   PROT 7.2 02/12/2016 1316   ALBUMIN 3.8 04/29/2017 0800   ALBUMIN 3.7 02/12/2016 1316   AST 15 06/05/2023 1536   AST 18 02/12/2016 1316   ALT 14 06/05/2023 1536   ALT 19 02/12/2016 1316   ALKPHOS 82 04/29/2017 0800   ALKPHOS 108 02/12/2016 1316   BILITOT 0.5 06/05/2023 1536   BILITOT 0.61 02/12/2016 1316   GFRNONAA 47 (L) 05/18/2021 0937   GFRNONAA 57 (L) 03/14/2021 0958   GFRAA 66 03/14/2021 0958     Assessment/Plan:   GERD Epigastric pain New onset GERD/epigastric pain in the setting of recent stress.  No NSAIDs.  Breakthrough symptoms on PPI twice daily and famotidine at bedtime.  Discussed conservative treatment versus EGD.  Joint decision making with patient and husband ultimately led to proceeding with EGD for further evaluation  - EGD to evaluate for gastritis, esophagitis, PUD - Will get blood thinner cease from cardiologist and hold Eliquis x 2 days - I thoroughly discussed the procedure with the patient (at bedside) to include nature of the procedure, alternatives, benefits, and risks (including but not limited to bleeding, infection, perforation, anesthesia/cardiac pulmonary complications).  Patient verbalized understanding and gave verbal consent to proceed with procedure. - Carafate twice daily (warned of constipation side effects) - Educated patient on lifestyle modifications and provided patient education handout  Weight loss 10 pound weight loss over the course of a month after decreased caloric intake and smaller more frequent meals.  Suspect diet changes led to weight loss. - EGD for further evaluation - If persistent weight loss and EGD is negative we could consider CT scan  [facility] 07/07/2023, 10:56 AM  Cc: Donita Brooks, MD

## 2023-07-07 NOTE — Patient Instructions (Signed)
_______________________________________________________  If your blood pressure at your visit was 140/90 or greater, please contact your primary care physician to follow up on this.  _______________________________________________________  If you are age 80 or older, your body mass index should be between 23-30. Your Body mass index is 29.29 kg/m. If this is out of the aforementioned range listed, please consider follow up with your Primary Care Provider.  If you are age 63 or younger, your body mass index should be between 19-25. Your Body mass index is 29.29 kg/m. If this is out of the aformentioned range listed, please consider follow up with your Primary Care Provider.   ________________________________________________________  The  GI providers would like to encourage you to use [facility] to communicate with providers for non-urgent requests or questions.  Due to long hold times on the telephone, sending your provider a message by [facility] may be a faster and more efficient way to get a response.  Please allow 48 business hours for a response.  Please remember that this is for non-urgent requests.  _______________________________________________________  Alexandra Calderon have been scheduled for an endoscopy. Please follow written instructions given to you at your visit today.  If you use inhalers (even only as needed), please bring them with you on the day of your procedure.  If you take any of the following medications, they will need to be adjusted prior to your procedure:   DO NOT TAKE 7 DAYS PRIOR TO TEST- Trulicity (dulaglutide) Ozempic, Wegovy (semaglutide) Mounjaro (tirzepatide) Bydureon Bcise (exanatide extended release)  DO NOT TAKE 1 DAY PRIOR TO YOUR TEST Rybelsus (semaglutide) Adlyxin (lixisenatide) Victoza (liraglutide) Byetta (exanatide) ___________________________________________________________________________  Due to recent changes in healthcare laws, you may see the  results of your imaging and laboratory studies on MyChart before your provider has had a chance to review them.  We understand that in some cases there may be results that are confusing or concerning to you. Not all laboratory results come back in the same time frame and the provider may be waiting for multiple results in order to interpret others.  Please give [place] 48 hours in order for your provider to thoroughly review all the results before contacting the office for clarification of your results.   It was a pleasure to see you today!  Thank you for trusting me with your gastrointestinal care!

## 2023-07-14 ENCOUNTER — Other Ambulatory Visit: Payer: Self-pay

## 2023-07-14 ENCOUNTER — Telehealth: Payer: Self-pay | Admitting: *Deleted

## 2023-07-14 ENCOUNTER — Telehealth: Payer: Self-pay

## 2023-07-14 DIAGNOSIS — T884XXA Failed or difficult intubation, initial encounter: Secondary | ICD-10-CM | POA: Insufficient documentation

## 2023-07-14 DIAGNOSIS — K219 Gastro-esophageal reflux disease without esophagitis: Secondary | ICD-10-CM

## 2023-07-14 DIAGNOSIS — R1013 Epigastric pain: Secondary | ICD-10-CM

## 2023-07-14 NOTE — Telephone Encounter (Signed)
[facility] Pre-operative Risk Assessment     Request for surgical clearance:     Endoscopy Procedure  What type of surgery is being performed?     EGD    When is this surgery scheduled?     07-20-2023  What type of clearance is required ?   Pharmacy  Are there any medications that need to be held prior to surgery and how long? Yes, 2 days before  Practice name and name of physician performing surgery?      [facility]  What is your office phone and fax number?      Phone- [phone]  Fax- [phone]  Anesthesia type (None, local, MAC, general) ?       MAC

## 2023-07-14 NOTE — Telephone Encounter (Signed)
Pharmacy please advise on holding Eliquis prior to EGD scheduled for 10 7/24. Thank you.

## 2023-07-14 NOTE — Telephone Encounter (Signed)
Patient with diagnosis of afib on Eliquis for anticoagulation.    Procedure: EGD Date of procedure: scheduled on 07/27/23 not 07/20/23  CHA2DS2-VASc Score = 5  This indicates a 7.2% annual risk of stroke. The patient's score is based upon: CHF History: 0 HTN History: 1 Diabetes History: 0 Stroke History: 0 Vascular Disease History: 1 Age Score: 2 Gender Score: 1   CrCl 52mL/min using adjusted body weight Platelet count 189K  Per office protocol, patient can hold Eliquis for 2 days prior to procedure as requested.    **This guidance is not considered finalized until pre-operative APP has relayed final recommendations.**

## 2023-07-14 NOTE — Telephone Encounter (Signed)
Dr. Leone Payor,   This pt is a documented difficult intubation and her procedure will need to be done at the hospital.   Thanks,  Cathlyn Parsons

## 2023-07-14 NOTE — Telephone Encounter (Signed)
Pt chart was reviewed and noted:  Gaston Islam., NP      07/14/23 12:03 PM Note     Patient Name: Alexandra Calderon  DOB: [birth year] MRN: [id number]   Primary Cardiologist: Kristeen Miss, MD   Clinical pharmacists have reviewed the patient's past medical history, labs, and current medications as part of preoperative protocol coverage. The following recommendations have been made:   Per office protocol, patient can hold Eliquis for 2 days prior to procedure as requested.    I will route this recommendation to the requesting party via Epic fax function and remove from pre-op pool.   Please call with questions.   Napoleon Form, Leodis Rains, NP 07/14/2023, 12:02 PM       Pt made aware. Pt verbalized understanding with all questions answered.

## 2023-07-14 NOTE — Telephone Encounter (Signed)
Pt made aware of recent recommendations by Dr. Leone Payor. Procedure was canceled for the [facility] .Pt made aware  Pt was ordered and scheduled to have an EGD on 08/06/2023 at 10:15 AM  at [facility]. Pt made aware: Case ID  Prep instructions sent to pt via my chart.  Ambulatory referral to GI placed in Epic Clearance was sent to provider Alphonzo Severance PA at the [facility] who manages her Eliquis. Pt made aware Pt verbalized understanding with all questions answered.

## 2023-07-14 NOTE — Telephone Encounter (Signed)
Alexandra Calderon - please inform patient and cancel the [facility] case  Schedule her for next available hospital slot  SHE HAS TO HOLD ELIQUIS X 2 DAYS BEFORE THIS DOES NOT LOOK LIKE IT HAS BEEN SET UP YET?

## 2023-07-14 NOTE — Telephone Encounter (Signed)
[facility] Pre-operative Risk Assessment     Request for surgical clearance:     Endoscopy Procedure  What type of surgery is being performed?     Upper Endoscopy  When is this surgery scheduled?     08/06/2023  What type of clearance is required ?   Pharmacy  Are there any medications that need to be held prior to surgery and how long? Eliquis/ 2 days  Practice name and name of physician performing surgery?      [facility]/ Dr. Leone Payor  What is your office phone and fax number?      Phone- [phone]  Fax- [phone]  Anesthesia type (None, local, MAC, general) ?       MAC

## 2023-07-14 NOTE — Telephone Encounter (Signed)
Patient Name: Alexandra Calderon  DOB: 1941-07-19 MRN: [id number]  Primary Cardiologist: Kristeen Miss, MD  Clinical pharmacists have reviewed the patient's past medical history, labs, and current medications as part of preoperative protocol coverage. The following recommendations have been made:  Per office protocol, patient can hold Eliquis for 2 days prior to procedure as requested.   I will route this recommendation to the requesting party via Epic fax function and remove from pre-op pool.  Please call with questions.  Napoleon Form, Leodis Rains, NP 07/14/2023, 12:02 PM

## 2023-07-16 NOTE — Telephone Encounter (Signed)
Patient has been notified and aware to hold her Eliquis 2 days before the procedure. No additional questions at this time.

## 2023-07-17 ENCOUNTER — Other Ambulatory Visit: Payer: Self-pay | Admitting: Family Medicine

## 2023-07-17 ENCOUNTER — Telehealth: Payer: Self-pay

## 2023-07-17 MED ORDER — CEPHALEXIN 500 MG PO CAPS: 500.0000 mg | ORAL_CAPSULE | Freq: Three times a day (TID) | ORAL | 0 refills | Status: DC

## 2023-07-17 NOTE — Telephone Encounter (Signed)
Pt made aware to hold Eliquis 2 days prior to procedure. Pt verbalized understanding with all questions answered.

## 2023-07-17 NOTE — Telephone Encounter (Signed)
Pt called in asking if pcp would send in something for a UTI. Pt states that she is having burning, and a foul order with urination. Pt stated that she does have some amoxicillin left from a previous prescription but is afraid to take this med as it upsets her stomach. Please advise.  Cb#: [phone]

## 2023-07-20 ENCOUNTER — Encounter: Payer: Medicare HMO | Admitting: Internal Medicine

## 2023-07-22 ENCOUNTER — Other Ambulatory Visit: Payer: Self-pay | Admitting: Family Medicine

## 2023-07-22 ENCOUNTER — Other Ambulatory Visit: Payer: Self-pay | Admitting: Gastroenterology

## 2023-07-22 NOTE — Telephone Encounter (Signed)
Requested Prescriptions  Pending Prescriptions Disp Refills   pantoprazole (PROTONIX) 40 MG tablet [Pharmacy Med Name: PANTOPRAZOLE SOD DR 40 MG TAB] 180 tablet 1    Sig: TAKE 1 TABLET BY MOUTH TWICE A DAY     Gastroenterology: Proton Pump Inhibitors Failed - 07/22/2023 10:48 AM      Failed - Valid encounter within last 12 months    Recent Outpatient Visits           1 year ago Chronic sore throat   [facility] Pickard, Priscille Heidelberg, MD   1 year ago Dysuria   [facility] Tanya Nones, Priscille Heidelberg, MD   1 year ago Need for immunization against influenza   [facility] Tanya Nones, Priscille Heidelberg, MD   2 years ago Dysuria   [facility] Tanya Nones, Priscille Heidelberg, MD   2 years ago COVID-19   [facility] Valentino Nose, NP       Future Appointments             In 2 weeks Tanya Nones, Priscille Heidelberg, MD [facility], [facility]   In 3 months Nahser, Deloris Ping, MD [facility]             atenolol (TENORMIN) 50 MG tablet [Pharmacy Med Name: ATENOLOL 50 MG TABLET] 180 tablet 3    Sig: TAKE 1 TABLET BY MOUTH TWICE A DAY     Cardiovascular: Beta Blockers 2 Failed - 07/22/2023 10:48 AM      Failed - Valid encounter within last 6 months    Recent Outpatient Visits           1 year ago Chronic sore throat   [facility] Pickard, Priscille Heidelberg, MD   1 year ago Dysuria   [facility] Tanya Nones, Priscille Heidelberg, MD   1 year ago Need for immunization against influenza   [facility] Tanya Nones, Priscille Heidelberg, MD   2 years ago Dysuria   [facility] Tanya Nones, Priscille Heidelberg, MD   2 years ago COVID-19   [facility] Valentino Nose, NP       Future Appointments             In 2 weeks Tanya Nones, Priscille Heidelberg, MD [facility], [facility]   In 3 months Nahser, Deloris Ping, MD [facility]            Passed - Cr in normal range and within 360 days    Creatinine  Date Value Ref Range Status  02/12/2016 1.0 0.6 - 1.1 mg/dL Final   Creat  Date Value Ref Range Status  06/05/2023 0.92 0.60 - 0.95 mg/dL Final         Passed - Last BP in normal range    BP Readings from Last 1 Encounters:  07/07/23 126/74         Passed - Last Heart Rate in normal range    Pulse Readings from Last 1 Encounters:  07/07/23 85

## 2023-07-28 ENCOUNTER — Ambulatory Visit
Admission: RE | Admit: 2023-07-28 | Discharge: 2023-07-28 | Disposition: A | Discharge status: Home / Self Care | Payer: Medicare HMO | Source: Ambulatory Visit | Attending: Family Medicine | Admitting: Family Medicine

## 2023-07-28 DIAGNOSIS — Z1231 Encounter for screening mammogram for malignant neoplasm of breast: Secondary | ICD-10-CM | POA: Diagnosis not present

## 2023-07-30 ENCOUNTER — Ambulatory Visit (INDEPENDENT_AMBULATORY_CARE_PROVIDER_SITE_OTHER): Payer: Medicare HMO

## 2023-07-30 VITALS — Ht 65.0 in | Wt 176.0 lb

## 2023-07-30 DIAGNOSIS — Z0000 Encounter for general adult medical examination without abnormal findings: Secondary | ICD-10-CM | POA: Diagnosis not present

## 2023-07-30 NOTE — Progress Notes (Signed)
Subjective:   Alexandra Calderon is a 82 y.o. female who presents for Medicare Annual (Subsequent) preventive examination.  Visit Complete: Virtual I connected with  [name] on 07/30/23 by a audio enabled telemedicine application and verified that I am speaking with the correct person using two identifiers.  Patient Location: Home  Provider Location: Home Office  I discussed the limitations of evaluation and management by telemedicine. The patient expressed understanding and agreed to proceed.  Vital Signs: Because this visit was a virtual/telehealth visit, some criteria may be missing or patient reported. Any vitals not documented were not able to be obtained and vitals that have been documented are patient reported.  Cardiac Risk Factors include: advanced age (>82men, >29 women);hypertension;dyslipidemia;sedentary lifestyle     Objective:    Today's Vitals   07/30/23 1110  Weight: 176 lb (79.8 kg)  Height: 5\' 5"  (1.651 m)   Body mass index is 29.29 kg/m.     07/30/2023   11:14 AM 02/04/2023    2:29 PM 07/25/2021   10:39 AM 04/18/2021   11:19 AM 07/23/2020   10:36 AM 02/11/2017    2:49 PM 02/11/2017   10:45 AM  Advanced Directives  Does Patient Have a Medical Advance Directive? No No Yes No Yes No No  Type of Advance Directive   Healthcare Power of Attorney      Does patient want to make changes to medical advance directive?   Yes (ED - Information included in AVS)      Copy of Healthcare Power of Attorney in Chart?   No - copy requested      Would patient like information on creating a medical advance directive? Yes (MAU/Ambulatory/Procedural Areas - Information given) No - Patient declined         Current Medications (verified) Outpatient Encounter Medications as of 07/30/2023  Medication Sig   apixaban (ELIQUIS) 5 MG TABS tablet Take 1 tablet (5 mg total) by mouth 2 (two) times daily.   atenolol (TENORMIN) 50 MG tablet TAKE 1 TABLET BY MOUTH TWICE A DAY    Cholecalciferol (VITAMIN D) 50 MCG (2000 UT) CAPS Take 2,000 Units by mouth daily.   diltiazem (CARDIZEM) 30 MG tablet Take 1 tablet every 4 hours AS NEEDED for heart rate >100   Docusate Sodium (DSS) 100 MG CAPS Take 1 capsule by mouth daily.   famotidine (PEPCID) 40 MG tablet Take 1 tablet (40 mg total) by mouth daily.   losartan (COZAAR) 25 MG tablet TAKE 1 TABLET BY MOUTH EVERY DAY   pantoprazole (PROTONIX) 40 MG tablet TAKE 1 TABLET BY MOUTH TWICE A DAY   Probiotic Product (PROBIOTIC DAILY PO) Take 1 tablet by mouth every morning.   rosuvastatin (CRESTOR) 5 MG tablet TAKE 1 TABLET BY MOUTH EVERY DAY   sucralfate (CARAFATE) 1 g tablet Take 1 tablet (1 g total) by mouth 2 (two) times daily.   [DISCONTINUED] cephALEXin (KEFLEX) 500 MG capsule Take 1 capsule (500 mg total) by mouth 3 (three) times daily.   Facility-Administered Encounter Medications as of 07/30/2023  Medication   0.9 %  sodium chloride infusion    Allergies (verified) Gatifloxacin, Nitrofurantoin macrocrystal, Azithromycin, Levofloxacin, Sulfamethoxazole-trimethoprim, and Sulfonamide derivatives   History: Past Medical History:  Diagnosis Date   Allergy    Arthritis    Benign tumor of ear canal    Breast cancer (HCC)    right   Cataract    bilateral   Colon adenomas 2003,2006   Constipation  Diverticulosis    Dysrhythmia    Esophagitis    Fatty liver    GERD (gastroesophageal reflux disease)    Hearing loss    History of kidney stones    Hyperlipidemia    Hypertension    Irritable bowel syndrome 08/27/2012   Kidney stones    Obstructive sleep apnea (adult) (pediatric) 02/03/2023   Osteopenia    Palpitations    Personal history of radiation therapy    Pneumonia    Polyp, stomach    PVC (premature ventricular contraction)    PVC (premature ventricular contraction)    Schwannoma of nerve of head    UTI (lower urinary tract infection)    Past Surgical History:  Procedure Laterality Date    ACOUSTIC NEUROMA RESECTION     BREAST LUMPECTOMY Right 07/01/2011   w/sentinal node bx - right side   COLONOSCOPY W/ BIOPSIES     DILATION AND CURETTAGE OF UTERUS     ESOPHAGOGASTRODUODENOSCOPY     kidney stones     LAPAROSCOPIC CHOLECYSTECTOMY SINGLE SITE WITH INTRAOPERATIVE CHOLANGIOGRAM N/A 04/25/2021   Procedure: LAPAROSCOPIC CHOLECYSTECTOMY SINGLE SITE WITH INTRAOPERATIVE CHOLANGIOGRAM, POSSIBLE NEEDLE CORE LIVER BX;  Surgeon: Karie Soda, MD;  Location: [facility];  Service: General;  Laterality: N/A;   shoulder arthroscopy rt 2009     SHOULDER SURGERY     Family History  Problem Relation Age of Onset   Lymphoma Father    Hodgkin's lymphoma Father    Colon cancer Mother        early 56's   Diabetes Maternal Aunt    Diabetes Paternal Grandmother    Colon polyps Neg Hx    Rectal cancer Neg Hx    Stomach cancer Neg Hx    Pancreatic cancer Neg Hx    Esophageal cancer Neg Hx    Social History   Socioeconomic History   Marital status: Married    Spouse name: Not on file   Number of children: 2   Years of education: Not on file   Highest education level: Not on file  Occupational History   Occupation: retired  Tobacco Use   Smoking status: Never   Smokeless tobacco: Never   Tobacco comments:    Never smoke 12/25/21  Vaping Use   Vaping status: Never Used  Substance and Sexual Activity   Alcohol use: No   Drug use: No   Sexual activity: Yes  Other Topics Concern   Not on file  Social History Narrative   Spouse:  Alexandra Calderon   Children:   Alexandra Calderon- age 16- [place]   Alexandra Calderon- age 66- [place]   Social Determinants of Health   Financial Resource Strain: Low Risk  (07/30/2023)   Overall Financial Resource Strain (CARDIA)    Difficulty of Paying Living Expenses: Not hard at all  Food Insecurity: No Food Insecurity (07/30/2023)   Hunger Vital Sign    Worried About Running Out of Food in the Last Year: Never true    Ran Out of Food in the Last Year: Never  true  Transportation Needs: No Transportation Needs (07/30/2023)   PRAPARE - Administrator, Civil Service (Medical): No    Lack of Transportation (Non-Medical): No  Physical Activity: Inactive (07/30/2023)   Exercise Vital Sign    Days of Exercise per Week: 0 days    Minutes of Exercise per Session: 0 min  Stress: No Stress Concern Present (07/30/2023)   [employer] of Occupational Health - Occupational Stress  Questionnaire    Feeling of Stress : Not at all  Social Connections: Moderately Integrated (07/30/2023)   Social Connection and Isolation Panel [NHANES]    Frequency of Communication with Friends and Family: More than three times a week    Frequency of Social Gatherings with Friends and Family: Three times a week    Attends Religious Services: 1 to 4 times per year    Active Member of Clubs or Organizations: No    Attends Banker Meetings: Never    Marital Status: Married    Tobacco Counseling Counseling given: Not Answered Tobacco comments: Never smoke 12/25/21   Clinical Intake:  Pre-visit preparation completed: Yes  Pain : No/denies pain     Diabetes: No  How often do you need to have someone help you when you read instructions, pamphlets, or other written materials from your doctor or pharmacy?: 1 - Never  Interpreter Needed?: No  Information entered by :: Kandis Fantasia LPN   Activities of Daily Living    07/30/2023   11:14 AM  In your present state of health, do you have any difficulty performing the following activities:  Hearing? 0  Vision? 0  Difficulty concentrating or making decisions? 0  Walking or climbing stairs? 0  Dressing or bathing? 0  Doing errands, shopping? 0  Preparing Food and eating ? N  Using the Toilet? N  In the past six months, have you accidently leaked urine? N  Do you have problems with loss of bowel control? N  Managing your Medications? N  Managing your Finances? N  Housekeeping or  managing your Housekeeping? N    Patient Care Team: Donita Brooks, MD as PCP - General (Family Medicine) Nahser, Deloris Ping, MD as PCP - Cardiology (Cardiology) Erroll Luna, [facility] (Inactive) as Pharmacist (Pharmacist) Karie Soda, MD as Consulting Physician (General Surgery) Iva Boop, MD as Consulting Physician (Gastroenterology)  Indicate any recent Medical Services you may have received from other than [facility] providers in the past year (date may be approximate).     Assessment:   This is a routine wellness examination for Kristianne.  Hearing/Vision screen Hearing Screening - Comments:: Some hearing loss; followed by audiology  Vision Screening - Comments:: Wears rx glasses - up to date with routine eye exams     Goals Addressed             This Visit's Progress    Remain active and independent       COMPLETED: Track and Manage My Blood Pressure-Hypertension       Timeframe:  Long-Range Goal Priority:  High Start Date:  03/15/21                           Expected End Date: 09/14/21                      Follow Up Date 06/15/21   - check blood pressure weekly - choose a place to take my blood pressure (home, clinic or office, retail store) - write blood pressure results in a log or diary    Why is this important?   You won't feel high blood pressure, but it can still hurt your blood vessels.  High blood pressure can cause heart or kidney problems. It can also cause a stroke.  Making lifestyle changes like losing a little weight or eating less salt will help.  Checking your blood pressure at  home and at different times of the day can help to control blood pressure.  If the doctor prescribes medicine remember to take it the way the doctor ordered.  Call the office if you cannot afford the medicine or if there are questions about it.     Notes:       Depression Screen    07/30/2023   11:13 AM 02/03/2023   10:12 AM 07/28/2022   10:37 AM 07/25/2021    10:31 AM 07/23/2020   10:33 AM 07/21/2019    9:32 AM 06/21/2018   10:24 AM  PHQ 2/9 Scores  PHQ - 2 Score 0 0 0 0 0 0 0    Fall Risk    07/30/2023   11:14 AM 02/03/2023   10:12 AM 07/28/2022   10:37 AM 07/25/2021   10:31 AM 07/23/2020   10:33 AM  Fall Risk   Falls in the past year? 0 0 0 0 0  Number falls in past yr: 0 0 0 0 0  Injury with Fall? 0 0 0 0 0  Risk for fall due to : No Fall Risks No Fall Risks No Fall Risks    Follow up Falls prevention discussed;Education provided;Falls evaluation completed Falls prevention discussed Falls prevention discussed Falls evaluation completed     MEDICARE RISK AT HOME: Medicare Risk at Home Any stairs in or around the home?: No If so, are there any without handrails?: No Home free of loose throw rugs in walkways, pet beds, electrical cords, etc?: Yes Adequate lighting in your home to reduce risk of falls?: Yes Life alert?: No Use of a cane, walker or w/c?: No Grab bars in the bathroom?: Yes Shower chair or bench in shower?: No Elevated toilet seat or a handicapped toilet?: Yes  TIMED UP AND GO:  Was the test performed?  No    Cognitive Function:        07/30/2023   11:14 AM 07/23/2020   10:34 AM  6CIT Screen  What Year? 0 points 0 points  What month? 0 points 0 points  What time? 0 points 0 points  Count back from 20 0 points 0 points  Months in reverse 2 points 0 points  Repeat phrase 2 points 0 points  Total Score 4 points 0 points    Immunizations Immunization History  Administered Date(s) Administered   DTP 05/24/2007   Fluad Quad(high Dose 65+) 06/21/2019, 07/18/2020, 07/25/2021, 07/28/2022   Influenza Split 06/29/2012   Influenza Whole 10/14/1999, 07/29/2007, 07/28/2008, 07/05/2009, 07/26/2010   Influenza, High Dose Seasonal PF 07/25/2014, 08/02/2015, 06/25/2016, 06/16/2017   Influenza,inj,Quad PF,6+ Mos 07/05/2013, 06/21/2018   PFIZER(Purple Top)SARS-COV-2 Vaccination 10/25/2019, 11/14/2019, 08/02/2020    Pneumococcal Conjugate-13 08/15/2013   Pneumococcal Polysaccharide-23 10/13/2002, 04/19/2015   Td 10/13/2006, 05/24/2007   Zoster, Live 08/05/2016    TDAP status: Up to date  Flu Vaccine status: Due, Education has been provided regarding the importance of this vaccine. Advised may receive this vaccine at local pharmacy or Health Dept. Aware to provide a copy of the vaccination record if obtained from local pharmacy or Health Dept. Verbalized acceptance and understanding.  Pneumococcal vaccine status: Up to date  Covid-19 vaccine status: Information provided on how to obtain vaccines.   Qualifies for Shingles Vaccine? Yes   Zostavax completed Yes   Shingrix Completed?: No.    Education has been provided regarding the importance of this vaccine. Patient has been advised to call insurance company to determine out of pocket expense if they have not  yet received this vaccine. Advised may also receive vaccine at local pharmacy or Health Dept. Verbalized acceptance and understanding.  Screening Tests Health Maintenance  Topic Date Due   Zoster Vaccines- Shingrix (1 of 2) 01/28/1960   COVID-19 Vaccine (4 - 2023-24 season) 06/14/2023   INFLUENZA VACCINE  01/11/2024 (Originally 05/14/2023)   Medicare Annual Wellness (AWV)  07/29/2024   Pneumonia Vaccine 28+ Years old  Completed   DEXA SCAN  Completed   HPV VACCINES  Aged Out   DTaP/Tdap/Td  Discontinued   Colonoscopy  Discontinued   Hepatitis C Screening  Discontinued    Health Maintenance  Health Maintenance Due  Topic Date Due   Zoster Vaccines- Shingrix (1 of 2) 01/28/1960   COVID-19 Vaccine (4 - 2023-24 season) 06/14/2023    Colorectal cancer screening: No longer required.   Mammogram status: Completed 07/28/23. Repeat every year  Bone Density status: Completed 01/20/23. Results reflect: Bone density results: OSTEOPENIA. Repeat every 2 years.  Lung Cancer Screening: (Low Dose CT Chest recommended if Age 30-80 years, 20 pack-year  currently smoking OR have quit w/in 15years.) does not qualify.   Lung Cancer Screening Referral: n/a  Additional Screening:  Hepatitis C Screening: does not qualify  Vision Screening: Recommended annual ophthalmology exams for early detection of glaucoma and other disorders of the eye. Is the patient up to date with their annual eye exam?  Yes  Who is the provider or what is the name of the office in which the patient attends annual eye exams? none If pt is not established with a provider, would they like to be referred to a provider to establish care? No .   Dental Screening: Recommended annual dental exams for proper oral hygiene  Community Resource Referral / Chronic Care Management: CRR required this visit?  No   CCM required this visit?  No     Plan:     I have personally reviewed and noted the following in the patient's chart:   Medical and social history Use of alcohol, tobacco or illicit drugs  Current medications and supplements including opioid prescriptions. Patient is not currently taking opioid prescriptions. Functional ability and status Nutritional status Physical activity Advanced directives List of other physicians Hospitalizations, surgeries, and ER visits in previous 12 months Vitals Screenings to include cognitive, depression, and falls Referrals and appointments  In addition, I have reviewed and discussed with patient certain preventive protocols, quality metrics, and best practice recommendations. A written personalized care plan for preventive services as well as general preventive health recommendations were provided to patient.     Kandis Fantasia [place], [place]   [id number]/29/5621   After Visit Summary: (MyChart) Due to this being a telephonic visit, the after visit summary with patients personalized plan was offered to patient via MyChart   Nurse Notes: No concerns at this time

## 2023-07-30 NOTE — Patient Instructions (Signed)
Ms. Morrissette , Thank you for taking time to come for your Medicare Wellness Visit. I appreciate your ongoing commitment to your health goals. Please review the following plan we discussed and let me know if I can assist you in the future.   Referrals/Orders/Follow-Ups/Clinician Recommendations: Aim for 30 minutes of exercise or brisk walking, 6-8 glasses of water, and 5 servings of fruits and vegetables each day.  This is a list of the screening recommended for you and due dates:  Health Maintenance  Topic Date Due   Zoster (Shingles) Vaccine (1 of 2) [birth year]28/1962   COVID-19 Vaccine (4 - 2023-24 season) 06/14/2023   Flu Shot  01/11/2024*   Medicare Annual Wellness Visit  07/29/2024   Pneumonia Vaccine  Completed   DEXA scan (bone density measurement)  Completed   HPV Vaccine  Aged Out   DTaP/Tdap/Td vaccine  Discontinued   Colon Cancer Screening  Discontinued   Hepatitis C Screening  Discontinued  *Topic was postponed. The date shown is not the original due date.    Advanced directives: (ACP Link)Information on Advanced Care Planning can be found at [facility] Advance Health Care Directives Advance Health Care Directives ([email or web address]   Next Medicare Annual Wellness Visit scheduled for next year: Yes

## 2023-08-03 ENCOUNTER — Other Ambulatory Visit: Payer: Medicare HMO

## 2023-08-03 DIAGNOSIS — I10 Essential (primary) hypertension: Secondary | ICD-10-CM

## 2023-08-03 DIAGNOSIS — E785 Hyperlipidemia, unspecified: Secondary | ICD-10-CM

## 2023-08-04 ENCOUNTER — Other Ambulatory Visit: Payer: Self-pay | Admitting: Family Medicine

## 2023-08-04 LAB — CBC WITH DIFFERENTIAL/PLATELET
Absolute Lymphocytes: 1143 {cells}/uL (ref 850–3900)
Absolute Monocytes: 398 {cells}/uL (ref 200–950)
Basophils Absolute: 12 {cells}/uL (ref 0–200)
Basophils Relative: 0.3 %
Eosinophils Absolute: 39 {cells}/uL (ref 15–500)
Eosinophils Relative: 1 %
HCT: 41.8 % (ref 35.0–45.0)
Hemoglobin: 13.2 g/dL (ref 11.7–15.5)
MCH: 28.7 pg (ref 27.0–33.0)
MCHC: 31.6 g/dL — ABNORMAL LOW (ref 32.0–36.0)
MCV: 90.9 fL (ref 80.0–100.0)
MPV: 11.2 fL (ref 7.5–12.5)
Monocytes Relative: 10.2 %
Neutro Abs: 2309 {cells}/uL (ref 1500–7800)
Neutrophils Relative %: 59.2 %
Platelets: 184 10*3/uL (ref 140–400)
RBC: 4.6 Million/uL (ref 3.80–5.10)
RDW: 14.3 % (ref 11.0–15.0)
Total Lymphocyte: 29.3 %
WBC: 3.9 10*3/uL (ref 3.8–10.8)

## 2023-08-04 LAB — COMPLETE METABOLIC PANEL WITHOUT GFR
AG Ratio: 1.3 (calc) (ref 1.0–2.5)
ALT: 11 U/L (ref 6–29)
AST: 17 U/L (ref 10–35)
Albumin: 3.9 g/dL (ref 3.6–5.1)
Alkaline phosphatase (APISO): 80 U/L (ref 37–153)
BUN/Creatinine Ratio: 15 (calc) (ref 6–22)
BUN: 15 mg/dL (ref 7–25)
CO2: 30 mmol/L (ref 20–32)
Calcium: 9.1 mg/dL (ref 8.6–10.4)
Chloride: 104 mmol/L (ref 98–110)
Creat: 1 mg/dL — ABNORMAL HIGH (ref 0.60–0.95)
Globulin: 3.1 g/dL (ref 1.9–3.7)
Glucose, Bld: 99 mg/dL (ref 65–99)
Potassium: 4.1 mmol/L (ref 3.5–5.3)
Sodium: 145 mmol/L (ref 135–146)
Total Bilirubin: 0.6 mg/dL (ref 0.2–1.2)
Total Protein: 7 g/dL (ref 6.1–8.1)
eGFR: 57 mL/min/{1.73_m2} — ABNORMAL LOW

## 2023-08-04 LAB — LIPID PANEL
Cholesterol: 134 mg/dL
HDL: 42 mg/dL — ABNORMAL LOW
LDL Cholesterol (Calc): 71 mg/dL
Non-HDL Cholesterol (Calc): 92 mg/dL
Total CHOL/HDL Ratio: 3.2 (calc)
Triglycerides: 131 mg/dL

## 2023-08-04 NOTE — Telephone Encounter (Signed)
Prescription Request  08/04/2023  LOV: 06/05/2023  What is the name of the medication or equipment?   famotidine (PEPCID) 40 MG tablet   Have you contacted your pharmacy to request a refill? Yes   Which pharmacy would you like this sent to?  [id number] [place], [place] - [address] [place] [address] Phone: [phone] Fax: [phone]    Patient notified that their request is being sent to the clinical staff for review and that they should receive a response within 2 business days.   Please advise pharmacist.

## 2023-08-05 ENCOUNTER — Telehealth: Payer: Self-pay | Admitting: Internal Medicine

## 2023-08-05 MED ORDER — FAMOTIDINE 40 MG PO TABS: 40.0000 mg | ORAL_TABLET | Freq: Every day | ORAL | 1 refills | Status: DC

## 2023-08-05 NOTE — Telephone Encounter (Signed)
Requested Prescriptions  Pending Prescriptions Disp Refills   famotidine (PEPCID) 40 MG tablet 30 tablet 1    Sig: Take 1 tablet (40 mg total) by mouth daily.     Gastroenterology:  H2 Antagonists Failed - 08/04/2023 11:34 AM      Failed - Valid encounter within last 12 months    Recent Outpatient Visits           1 year ago Chronic sore throat   [facility] Tanya Nones, Priscille Heidelberg, MD   1 year ago Dysuria   [facility] Tanya Nones Priscille Heidelberg, MD   2 years ago Need for immunization against influenza   [facility] Tanya Nones Priscille Heidelberg, MD   2 years ago Dysuria   [facility] Donita Brooks, MD   2 years ago COVID-19   [facility] Valentino Nose, NP       Future Appointments             In 2 days Pickard, Priscille Heidelberg, MD [facility], [facility]   In 2 months Nahser, Deloris Ping, MD [facility]

## 2023-08-05 NOTE — Telephone Encounter (Signed)
The RN from the hospital called her and went over the meds.

## 2023-08-05 NOTE — Telephone Encounter (Signed)
Inbound call from patient, is unsure of when to hold her medications for procedure at [facility] tomorrow. Patient would like to speak with a nurse in regards. Please advise.

## 2023-08-06 ENCOUNTER — Other Ambulatory Visit: Payer: Self-pay

## 2023-08-06 ENCOUNTER — Ambulatory Visit (HOSPITAL_COMMUNITY): Payer: Medicare HMO | Admitting: Anesthesiology

## 2023-08-06 ENCOUNTER — Ambulatory Visit (HOSPITAL_COMMUNITY)
Admission: RE | Admit: 2023-08-06 | Discharge: 2023-08-06 | Disposition: A | Discharge status: Home / Self Care | Payer: Medicare HMO | Attending: Internal Medicine | Admitting: Internal Medicine

## 2023-08-06 ENCOUNTER — Encounter (HOSPITAL_COMMUNITY)
Admission: RE | Disposition: A | Discharge status: Home / Self Care | Payer: Self-pay | Source: Home / Self Care | Attending: Internal Medicine

## 2023-08-06 ENCOUNTER — Encounter (HOSPITAL_COMMUNITY): Payer: Self-pay | Admitting: Internal Medicine

## 2023-08-06 DIAGNOSIS — Z79899 Other long term (current) drug therapy: Secondary | ICD-10-CM | POA: Insufficient documentation

## 2023-08-06 DIAGNOSIS — I10 Essential (primary) hypertension: Secondary | ICD-10-CM | POA: Diagnosis not present

## 2023-08-06 DIAGNOSIS — I480 Paroxysmal atrial fibrillation: Secondary | ICD-10-CM | POA: Diagnosis not present

## 2023-08-06 DIAGNOSIS — K219 Gastro-esophageal reflux disease without esophagitis: Secondary | ICD-10-CM | POA: Diagnosis not present

## 2023-08-06 DIAGNOSIS — I4510 Unspecified right bundle-branch block: Secondary | ICD-10-CM | POA: Insufficient documentation

## 2023-08-06 DIAGNOSIS — K317 Polyp of stomach and duodenum: Secondary | ICD-10-CM | POA: Diagnosis not present

## 2023-08-06 DIAGNOSIS — I129 Hypertensive chronic kidney disease with stage 1 through stage 4 chronic kidney disease, or unspecified chronic kidney disease: Secondary | ICD-10-CM

## 2023-08-06 DIAGNOSIS — K2100 Gastro-esophageal reflux disease with esophagitis, without bleeding: Secondary | ICD-10-CM | POA: Diagnosis not present

## 2023-08-06 DIAGNOSIS — Z853 Personal history of malignant neoplasm of breast: Secondary | ICD-10-CM | POA: Insufficient documentation

## 2023-08-06 DIAGNOSIS — M1990 Unspecified osteoarthritis, unspecified site: Secondary | ICD-10-CM | POA: Diagnosis not present

## 2023-08-06 DIAGNOSIS — Z7901 Long term (current) use of anticoagulants: Secondary | ICD-10-CM | POA: Insufficient documentation

## 2023-08-06 DIAGNOSIS — I3139 Other pericardial effusion (noninflammatory): Secondary | ICD-10-CM | POA: Insufficient documentation

## 2023-08-06 DIAGNOSIS — Z923 Personal history of irradiation: Secondary | ICD-10-CM | POA: Diagnosis not present

## 2023-08-06 DIAGNOSIS — G4733 Obstructive sleep apnea (adult) (pediatric): Secondary | ICD-10-CM | POA: Insufficient documentation

## 2023-08-06 DIAGNOSIS — K3189 Other diseases of stomach and duodenum: Secondary | ICD-10-CM | POA: Insufficient documentation

## 2023-08-06 DIAGNOSIS — I080 Rheumatic disorders of both mitral and aortic valves: Secondary | ICD-10-CM | POA: Diagnosis not present

## 2023-08-06 DIAGNOSIS — E785 Hyperlipidemia, unspecified: Secondary | ICD-10-CM | POA: Diagnosis not present

## 2023-08-06 DIAGNOSIS — N189 Chronic kidney disease, unspecified: Secondary | ICD-10-CM

## 2023-08-06 DIAGNOSIS — T884XXA Failed or difficult intubation, initial encounter: Secondary | ICD-10-CM

## 2023-08-06 DIAGNOSIS — K319 Disease of stomach and duodenum, unspecified: Secondary | ICD-10-CM

## 2023-08-06 DIAGNOSIS — R1013 Epigastric pain: Secondary | ICD-10-CM | POA: Diagnosis present

## 2023-08-06 HISTORY — PX: ESOPHAGOGASTRODUODENOSCOPY (EGD) WITH PROPOFOL: SHX5813

## 2023-08-06 HISTORY — PX: BIOPSY: SHX5522

## 2023-08-06 SURGERY — ESOPHAGOGASTRODUODENOSCOPY (EGD) WITH PROPOFOL
Anesthesia: Monitor Anesthesia Care

## 2023-08-06 MED ORDER — PROPOFOL 10 MG/ML IV BOLUS
INTRAVENOUS | Status: DC | PRN
  Administered 2023-08-06: 40 mg via INTRAVENOUS
  Administered 2023-08-06: 100 mg via INTRAVENOUS

## 2023-08-06 MED ORDER — LIDOCAINE 2% (20 MG/ML) 5 ML SYRINGE
INTRAMUSCULAR | Status: DC | PRN
  Administered 2023-08-06: 100 mg via INTRAVENOUS

## 2023-08-06 MED ORDER — SODIUM CHLORIDE 0.9 % IV SOLN: INTRAVENOUS | Status: DC

## 2023-08-06 SURGICAL SUPPLY — 15 items
BLOCK BITE 60FR ADLT L/F BLUE (MISCELLANEOUS) ×3 IMPLANT
ELECT REM PT RETURN 9FT ADLT (ELECTROSURGICAL)
ELECTRODE REM PT RTRN 9FT ADLT (ELECTROSURGICAL) IMPLANT
FORCEP RJ3 GP 1.8X160 W-NEEDLE (CUTTING FORCEPS) IMPLANT
FORCEPS BIOP RAD 4 LRG CAP 4 (CUTTING FORCEPS) IMPLANT
NDL SCLEROTHERAPY 25GX240 (NEEDLE) IMPLANT
NEEDLE SCLEROTHERAPY 25GX240 (NEEDLE)
PROBE APC STR FIRE (PROBE) IMPLANT
PROBE INJECTION GOLD (MISCELLANEOUS)
PROBE INJECTION GOLD 7FR (MISCELLANEOUS) IMPLANT
SNARE SHORT THROW 13M SML OVAL (MISCELLANEOUS) IMPLANT
SYR 50ML LL SCALE MARK (SYRINGE) IMPLANT
TUBING ENDO SMARTCAP PENTAX (MISCELLANEOUS) ×6 IMPLANT
TUBING IRRIGATION ENDOGATOR (MISCELLANEOUS) ×3 IMPLANT
WATER STERILE IRR 1000ML POUR (IV SOLUTION) IMPLANT

## 2023-08-06 NOTE — Transfer of Care (Signed)
Immediate Anesthesia Transfer of Care Note  Patient: Alexandra Calderon  Procedure(s) Performed: ESOPHAGOGASTRODUODENOSCOPY (EGD) WITH PROPOFOL BIOPSY  Patient Location: PACU  Anesthesia Type:MAC  Level of Consciousness: awake, alert , and patient cooperative  Airway & Oxygen Therapy: Patient Spontanous Breathing and Patient connected to face mask oxygen  Post-op Assessment: Report given to RN and Post -op Vital signs reviewed and stable  Post vital signs: Reviewed and stable  Last Vitals:  Vitals Value Taken Time  BP 142/92 08/06/23 1048  Temp 36.4 C 08/06/23 1048  Pulse 117 08/06/23 1052  Resp 15 08/06/23 1052  SpO2 96 % 08/06/23 1052  Vitals shown include unfiled device data.  Last Pain:  Vitals:   08/06/23 1048  TempSrc:   PainSc: 0-No pain         Complications: No notable events documented.

## 2023-08-06 NOTE — Discharge Instructions (Addendum)
I did not see anything bad. The stomach had some red areas (erythema) and small polyps that look benign and innocent. I took biopsies to be sure no infection and will let you know.  It is possible your symptoms are related to stress in your life as the brain and gut are connected.  I appreciate the opportunity to care for you. Iva Boop, MD, FACG  YOU HAD AN ENDOSCOPIC PROCEDURE TODAY: Refer to the procedure report and other information in the discharge instructions given to you for any specific questions about what was found during the examination. If this information does not answer your questions, please call Dr. Marvell Fuller office at [phone] to clarify.   YOU SHOULD EXPECT: Some feelings of bloating in the abdomen. Passage of more gas than usual. Walking can help get rid of the air that was put into your GI tract during the procedure and reduce the bloating. If you had a lower endoscopy (such as a colonoscopy or flexible sigmoidoscopy) you may notice spotting of blood in your stool or on the toilet paper. Some abdominal soreness may be present for a day or two, also.  DIET: Your first meal following the procedure should be a light meal and then it is ok to progress to your normal diet. A half-sandwich or bowl of soup is an example of a good first meal. Heavy or fried foods are harder to digest and may make you feel nauseous or bloated. Drink plenty of fluids but you should avoid alcoholic beverages for 24 hours.   ACTIVITY: Your care partner should take you home directly after the procedure. You should plan to take it easy, moving slowly for the rest of the day. You can resume normal activity the day after the procedure however YOU SHOULD NOT DRIVE, use power tools, machinery or perform tasks that involve climbing or major physical exertion for 24 hours (because of the sedation medicines used during the test).   SYMPTOMS TO REPORT IMMEDIATELY: A gastroenterologist can be reached at any  hour. Please call [phone]  for any of the following symptoms:   Following upper endoscopy (EGD, EUS, ERCP, esophageal dilation) Vomiting of blood or coffee ground material  New, significant abdominal pain  New, significant chest pain or pain under the shoulder blades  Painful or persistently difficult swallowing  New shortness of breath  Black, tarry-looking or red, bloody stools  FOLLOW UP:  If any biopsies were taken you will be contacted by phone or by letter within the next 1-3 weeks. Call [phone]  if you have not heard about the biopsies in 3 weeks.  Please also call with any specific questions about appointments or follow up tests.

## 2023-08-06 NOTE — Anesthesia Preprocedure Evaluation (Signed)
Anesthesia Evaluation  Patient identified by MRN, date of birth, ID band Patient awake    Reviewed: Allergy & Precautions, NPO status , Patient's Chart, lab work & pertinent test results, reviewed documented beta blocker date and time   Airway Mallampati: II  TM Distance: >3 FB     Dental  (+) Teeth Intact, Caps, Dental Advisory Given   Pulmonary sleep apnea , pneumonia, resolved   Pulmonary exam normal breath sounds clear to auscultation       Cardiovascular hypertension, Pt. on medications and Pt. on home beta blockers Normal cardiovascular exam+ dysrhythmias Atrial Fibrillation  Rhythm:Regular Rate:Normal  EKG 12/25/22 NSR, Incomplete RBBB pattern, LAFB  Echo 06/12/21 1. Left ventricular ejection fraction, by estimation, is 60 to 65%. The  left ventricle has normal function. The left ventricle has no regional  wall motion abnormalities. Left ventricular diastolic parameters are  indeterminate. Elevated left ventricular  end-diastolic pressure.   2. Right ventricular systolic function is normal. The right ventricular  size is normal. There is moderately elevated pulmonary artery systolic  pressure. The estimated right ventricular systolic pressure is 50.0 mmHg.   3. The pericardial effusion is anterior to the right ventricle.   4. The mitral valve is degenerative. Trivial mitral valve regurgitation.  Mild mitral stenosis. The mean mitral valve gradient is 4.0 mmHg. Severe  mitral annular calcification.   5. The aortic valve is tricuspid. Aortic valve regurgitation is not  visualized. Mild aortic valve sclerosis is present, with no evidence of  aortic valve stenosis.   6. The inferior vena cava is normal in size with greater than 50%  respiratory variability, suggesting right atrial pressure of 3 mmHg.     Neuro/Psych Unilateral vestibular Schwannoma S/P resection  Neuromuscular disease  negative psych ROS   GI/Hepatic Neg  liver ROS,GERD  Medicated,,Epigastric pain   Endo/Other  negative endocrine ROS  Hx/o right breast Ca S/P lumpectomy RT HLD  Renal/GU Renal diseaseHx/o renal calculi  negative genitourinary   Musculoskeletal  (+) Arthritis , Osteoarthritis,    Abdominal   Peds  Hematology Eliquis therapy- last dose 10/21   Anesthesia Other Findings   Reproductive/Obstetrics                              Anesthesia Physical Anesthesia Plan  ASA: 3  Anesthesia Plan: MAC   Post-op Pain Management: Minimal or no pain anticipated   Induction: Intravenous  PONV Risk Score and Plan: 2 and Treatment may vary due to age or medical condition and Propofol infusion  Airway Management Planned: Natural Airway, Simple Face Mask and Nasal Cannula  Additional Equipment: None  Intra-op Plan:   Post-operative Plan:   Informed Consent: I have reviewed the patients History and Physical, chart, labs and discussed the procedure including the risks, benefits and alternatives for the proposed anesthesia with the patient or authorized representative who has indicated his/her understanding and acceptance.     Dental advisory given  Plan Discussed with: Anesthesiologist and CRNA  Anesthesia Plan Comments:          Anesthesia Quick Evaluation

## 2023-08-06 NOTE — Op Note (Addendum)
[facility] Patient Name: Alexandra Calderon Procedure Date : 08/06/2023 MRN: [id number] Attending MD: Iva Boop , MD, [id number] Date of Birth: 09-09-1941 CSN: [id number] Age: 83 Admit Type: Ambulatory Procedure:                Upper GI endoscopy Indications:              Epigastric abdominal pain, Dyspepsia Providers:                Iva Boop, MD, Jacquelyn "Jaci" Clelia Croft, RN,                            Marja Kays, Technician Referring MD:              Medicines:                Monitored Anesthesia Care Complications:            No immediate complications. Estimated Blood Loss:     Estimated blood loss was minimal. Procedure:                Pre-Anesthesia Assessment:                           - Prior to the procedure, a History and Physical                            was performed, and patient medications and                            allergies were reviewed. The patient's tolerance of                            previous anesthesia was also reviewed. The risks                            and benefits of the procedure and the sedation                            options and risks were discussed with the patient.                            All questions were answered, and informed consent                            was obtained. Prior Anticoagulants: The patient                            last took Eliquis (apixaban) 2 days prior to the                            procedure. ASA Grade Assessment: III - A patient                            with severe systemic disease. After reviewing the  risks and benefits, the patient was deemed in                            satisfactory condition to undergo the procedure.                           After obtaining informed consent, the endoscope was                            passed under direct vision. Throughout the                            procedure, the patient's blood pressure, pulse, and                             oxygen saturations were monitored continuously. The                            [id number]-[id number] ([id number] Olympus endoscope was introduced                            through the mouth, and advanced to the second part                            of duodenum. The upper GI endoscopy was                            accomplished without difficulty. The patient                            tolerated the procedure well. Scope In: Scope Out: Findings:      Multiple diminutive semi-sessile polyps were found in the gastric body.       Biopsies were taken with a cold forceps for histology.      Striped moderately erythematous mucosa without bleeding was found in the       gastric antrum. Biopsies were taken with a cold forceps for histology.       Verification of patient identification for the specimen was done.       Estimated blood loss was minimal.      The exam was otherwise without abnormality.      The cardia and gastric fundus were normal on retroflexion. Impression:               - Multiple gastric polyps. Biopsied.                           - Erythematous mucosa in the antrum. Biopsied.                           - The examination was otherwise normal. Recommendation:           - Patient has a contact number available for                            emergencies. The signs and symptoms of potential  delayed complications were discussed with the                            patient. Return to normal activities tomorrow.                            Written discharge instructions were provided to the                            patient.                           - Resume previous diet.                           - Continue present medications.                           - Await pathology results.                           - Consider that stress causing brain-gut connection                            symptoms                           resume Eliquis tomorrow - this  was added to AVS by                            RN and also on copy of note provided to patient Procedure Code(s):        --- Professional ---                           [id number], Esophagogastroduodenoscopy, flexible,                            transoral; with biopsy, single or multiple Diagnosis Code(s):        --- Professional ---                           K31.7, Polyp of stomach and duodenum                           K31.89, Other diseases of stomach and duodenum                           R10.13, Epigastric pain CPT copyright 2022 [employer]. All rights reserved. The codes documented in this report are preliminary and upon coder review may  be revised to meet current compliance requirements. Iva Boop, MD 08/06/2023 10:53:51 AM This report has been signed electronically. Number of Addenda: 0

## 2023-08-06 NOTE — Anesthesia Postprocedure Evaluation (Signed)
Anesthesia Post Note  Patient: Alexandra Calderon  Procedure(s) Performed: ESOPHAGOGASTRODUODENOSCOPY (EGD) WITH PROPOFOL BIOPSY     Patient location during evaluation: PACU Anesthesia Type: MAC Level of consciousness: awake and alert and oriented Pain management: pain level controlled Vital Signs Assessment: post-procedure vital signs reviewed and stable Respiratory status: spontaneous breathing, nonlabored ventilation and respiratory function stable Cardiovascular status: stable and blood pressure returned to baseline Postop Assessment: no apparent nausea or vomiting Anesthetic complications: no   No notable events documented.  Last Vitals:  Vitals:   08/06/23 1048 08/06/23 1103  BP: (!) 142/92 (!) 156/74  Pulse: 62 (!) 57  Resp: 13 15  Temp: (!) 36.4 C (!) 36.4 C  SpO2: 100% 96%    Last Pain:  Vitals:   08/06/23 1103  TempSrc:   PainSc: 0-No pain                 Graciano Batson A.

## 2023-08-06 NOTE — Interval H&P Note (Signed)
History and Physical Interval Note:  08/06/2023 10:26 AM  Alexandra Calderon  has presented today for surgery, with the diagnosis of GERD, Epigastric Pain.  The various methods of treatment have been discussed with the patient and family. After consideration of risks, benefits and other options for treatment, the patient has consented to  Procedure(s): ESOPHAGOGASTRODUODENOSCOPY (EGD) WITH PROPOFOL (N/A) as a surgical intervention.  The patient's history has been reviewed, patient examined, no change in status, stable for surgery.  I have reviewed the patient's chart and labs.  Questions were answered to the patient's satisfaction.     Stan Head

## 2023-08-07 ENCOUNTER — Ambulatory Visit (INDEPENDENT_AMBULATORY_CARE_PROVIDER_SITE_OTHER): Payer: Medicare HMO | Admitting: Family Medicine

## 2023-08-07 ENCOUNTER — Encounter: Payer: Self-pay | Admitting: Family Medicine

## 2023-08-07 VITALS — BP 120/50 | HR 60 | Temp 98.0°F | Ht 65.0 in | Wt 176.0 lb

## 2023-08-07 DIAGNOSIS — E7800 Pure hypercholesterolemia, unspecified: Secondary | ICD-10-CM

## 2023-08-07 DIAGNOSIS — D3611 Benign neoplasm of peripheral nerves and autonomic nervous system of face, head, and neck: Secondary | ICD-10-CM

## 2023-08-07 DIAGNOSIS — R8290 Unspecified abnormal findings in urine: Secondary | ICD-10-CM

## 2023-08-07 DIAGNOSIS — Z23 Encounter for immunization: Secondary | ICD-10-CM | POA: Diagnosis not present

## 2023-08-07 DIAGNOSIS — Z0000 Encounter for general adult medical examination without abnormal findings: Secondary | ICD-10-CM

## 2023-08-07 DIAGNOSIS — Z0001 Encounter for general adult medical examination with abnormal findings: Secondary | ICD-10-CM

## 2023-08-07 DIAGNOSIS — Z853 Personal history of malignant neoplasm of breast: Secondary | ICD-10-CM

## 2023-08-07 DIAGNOSIS — K219 Gastro-esophageal reflux disease without esophagitis: Secondary | ICD-10-CM | POA: Diagnosis not present

## 2023-08-07 MED ORDER — PANTOPRAZOLE SODIUM 40 MG PO TBEC: 40.0000 mg | DELAYED_RELEASE_TABLET | Freq: Two times a day (BID) | ORAL | 1 refills | Status: DC

## 2023-08-07 NOTE — Progress Notes (Signed)
Subjective:    Patient ID: Alexandra Calderon, female    DOB: [birth year], 82 y.o.   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  They plan to repeat an MRI in 6 months.  Around the same time, I have been seeing the patient for severe reflux.  I stopped the patient's Fosamax.  Close patient called with antibiotics and added Pepcid.  She had an EGD yesterday that showed reactive gastritis with polyps but the polyps were negative for cancer.  She has a follow-up appointment to see GI per her report in 1 to 2 months.  Her heartburn is getting better.  I recommended that she continue the Protonix and the Pepcid until seen by GI.  Then I plan to wean her off the Pepcid and wean back on Protonix if possible.  She is due for a tetanus shot but she declines.  She has had her flu shot today.  She is also due for COVID, shingles, and RSV vaccinations.  Her mammogram was recently performed and was normal.  Her colonoscopy is up-to-date and she does not require Pap smear. Past Medical History:  Diagnosis Date   Allergy    Arthritis    Benign tumor of ear canal    Breast cancer (HCC)    right   Cataract    bilateral   Colon adenomas 2003,2006   Constipation    Diverticulosis    Dysrhythmia    Esophagitis    Fatty liver    GERD (gastroesophageal reflux disease)    Hearing loss    History of kidney stones    Hyperlipidemia    Hypertension    Irritable bowel syndrome 08/27/2012   Kidney stones    Obstructive sleep apnea (adult) (pediatric) 02/03/2023   Osteopenia    Palpitations    Personal history of radiation therapy    Pneumonia    Polyp, stomach    PVC (premature ventricular contraction)    PVC (premature ventricular contraction)    Schwannoma of nerve of head    UTI (lower urinary tract infection)    Past Surgical History:  Procedure Laterality Date   ACOUSTIC NEUROMA RESECTION     BREAST LUMPECTOMY Right  07/01/2011   w/sentinal node bx - right side   COLONOSCOPY W/ BIOPSIES     DILATION AND CURETTAGE OF UTERUS     ESOPHAGOGASTRODUODENOSCOPY     kidney stones     LAPAROSCOPIC CHOLECYSTECTOMY SINGLE SITE WITH INTRAOPERATIVE CHOLANGIOGRAM N/A 04/25/2021   Procedure: LAPAROSCOPIC CHOLECYSTECTOMY SINGLE SITE WITH INTRAOPERATIVE CHOLANGIOGRAM, POSSIBLE NEEDLE CORE LIVER BX;  Surgeon: Karie Soda, MD;  Location: [facility];  Service: General;  Laterality: N/A;   shoulder arthroscopy rt 2009     SHOULDER SURGERY     Current Outpatient Medications on File Prior to Visit  Medication Sig Dispense Refill   apixaban (ELIQUIS) 5 MG TABS tablet Take 1 tablet (5 mg total) by mouth 2 (two) times daily. 60 tablet 6   atenolol (TENORMIN) 50 MG tablet TAKE 1 TABLET BY MOUTH TWICE A DAY 180 tablet 3   Cholecalciferol (VITAMIN D) 50 MCG (2000 UT) CAPS Take 2,000 Units by mouth daily.     diltiazem (CARDIZEM) 30 MG tablet Take 1 tablet every 4 hours AS NEEDED for heart rate >100 30 tablet 4   Docusate Sodium (DSS) 100 MG CAPS Take 1 capsule by mouth daily.     famotidine (PEPCID) 40 MG tablet Take  1 tablet (40 mg total) by mouth daily. 30 tablet 1   losartan (COZAAR) 25 MG tablet TAKE 1 TABLET BY MOUTH EVERY DAY 90 tablet 2   Probiotic Product (PROBIOTIC DAILY PO) Take 1 tablet by mouth every morning.     rosuvastatin (CRESTOR) 5 MG tablet TAKE 1 TABLET BY MOUTH EVERY DAY 90 tablet 3   sucralfate (CARAFATE) 1 g tablet Take 1 tablet (1 g total) by mouth 2 (two) times daily. 60 tablet 0   Current Facility-Administered Medications on File Prior to Visit  Medication Dose Route Frequency Provider Last Rate Last Admin   0.9 %  sodium chloride infusion  500 mL Intravenous Once Iva Boop, MD       Allergies  Allergen Reactions   Gatifloxacin     unknown   Nitrofurantoin Macrocrystal     unknown   Azithromycin Rash   Levofloxacin Rash    REACTION: rash    Sulfamethoxazole-Trimethoprim Rash and Other (See  Comments)    Chills, fever   Sulfonamide Derivatives Rash and Other (See Comments)    Chills, fever   Social History   Socioeconomic History   Marital status: Married    Spouse name: Not on file   Number of children: 2   Years of education: Not on file   Highest education level: Not on file  Occupational History   Occupation: retired  Tobacco Use   Smoking status: Never   Smokeless tobacco: Never   Tobacco comments:    Never smoke 12/25/21  Vaping Use   Vaping status: Never Used  Substance and Sexual Activity   Alcohol use: No   Drug use: No   Sexual activity: Yes  Other Topics Concern   Not on file  Social History Narrative   Spouse:  [name]   Children:   [name]- age 23- [place]   Ulis Rias- age 86- [place]   Social Determinants of Health   Financial Resource Strain: Low Risk  (07/30/2023)   Overall Financial Resource Strain (CARDIA)    Difficulty of Paying Living Expenses: Not hard at all  Food Insecurity: No Food Insecurity (07/30/2023)   Hunger Vital Sign    Worried About Running Out of Food in the Last Year: Never true    Ran Out of Food in the Last Year: Never true  Transportation Needs: No Transportation Needs (07/30/2023)   PRAPARE - Administrator, Civil Service (Medical): No    Lack of Transportation (Non-Medical): No  Physical Activity: Inactive (08/06/2023)   Exercise Vital Sign    Days of Exercise per Week: 0 days    Minutes of Exercise per Session: 0 min  Stress: No Stress Concern Present (08/06/2023)   [employer] of Occupational Health - Occupational Stress Questionnaire    Feeling of Stress : Only a little  Social Connections: Moderately Integrated (08/06/2023)   Social Connection and Isolation Panel [NHANES]    Frequency of Communication with Friends and Family: Once a week    Frequency of Social Gatherings with Friends and Family: Three times a week    Attends Religious Services: 1 to 4 times per year     Active Member of Clubs or Organizations: No    Attends Banker Meetings: Never    Marital Status: Married  Catering manager Violence: Not At Risk (07/30/2023)   Humiliation, Afraid, Rape, and Kick questionnaire    Fear of Current or Ex-Partner: No    Emotionally Abused: No  Physically Abused: No    Sexually Abused: No    Review of Systems  All other systems reviewed and are negative.      Objective:   Physical Exam Constitutional:      General: She is not in acute distress.    Appearance: Normal appearance. She is normal weight. She is not ill-appearing, toxic-appearing or diaphoretic.  HENT:     Mouth/Throat:     Mouth: No oral lesions or angioedema.     Dentition: No gingival swelling or gum lesions.     Tongue: No lesions.     Tonsils: No tonsillar exudate or tonsillar abscesses.  Neck:     Thyroid: No thyroid mass.  Cardiovascular:     Rate and Rhythm: Normal rate and regular rhythm.     Pulses: Normal pulses.     Heart sounds: Normal heart sounds. No murmur heard.    No friction rub. No gallop.  Pulmonary:     Effort: Pulmonary effort is normal. No respiratory distress.     Breath sounds: Normal breath sounds. No stridor.  Abdominal:     General: Bowel sounds are normal. There is no distension.     Palpations: Abdomen is soft.     Tenderness: There is no abdominal tenderness. There is no guarding.  Musculoskeletal:     Cervical back: Normal range of motion and neck supple. No pain with movement.  Lymphadenopathy:     Cervical:     Right cervical: No superficial or deep cervical adenopathy.    Left cervical: No superficial or deep cervical adenopathy.  Neurological:     Mental Status: She is alert.           Assessment & Plan:  Need for vaccination - Plan: Flu Vaccine Trivalent High Dose (Fluad)  Malodorous urine - Plan: Urinalysis, Routine w reflex microscopic, Urine Culture  Gastroesophageal reflux disease, unspecified whether  esophagitis present  Schwannoma of nerve of head  Pure hypercholesterolemia  Personal history of breast cancer  General medical exam Patient will follow-up with neurosurgery as planned for an MRI in 6 months but has been treated for her acoustic neuroma.  I have recommended that we switch her to Prolia to treat osteoporosis given her severe acid reflux as I believe Fosamax may have been exacerbating this.  She will let me know her decision.  Continue Protonix twice daily and Pepcid once daily until seen by GI.  EGD was negative.  Likely will discontinue Pepcid at that point if she is doing well and gradually weaning down on Protonix.  Patient declines a tetanus shot.  She received her flu shot today.  Recommended COVID and RSV vaccination.  Pneumonia is up-to-date.  Plans for shingles booster.  Breast cancer screening is up-to-date.  Colonoscopy is up-to-date.  Reviewed her lab work which was excellent Admission on 08/06/2023, Discharged on 08/06/2023  Component Date Value Ref Range Status   SURGICAL PATHOLOGY 08/06/2023    Final                   Value:SURGICAL PATHOLOGY CASE: [id number] PATIENT: Efrat S[facility] Surgical Pathology Report     Clinical History: GERD, epigastric pain, R/O gastritis and H. Pylori, R/O neoplasia (cm)     FINAL MICROSCOPIC DIAGNOSIS:  A. STOMACH, ANTRUM, BIOPSY: Gastric antral and oxyntic mucosa with changes of reactive gastropathy. Negative for Helicobacter pylori. Negative for dysplasia or malignancy.  B. STOMACH, BODY, POLYPECTOMY: Fundic gland polyps. Negative for Helicobacter pylori. Negative for dysplasia or malignancy.  GROSS DESCRIPTION:  A. Received in formalin labeled with the patients name and "Antrum biopsies" is a 0.6 x 0.6 x 0.2 cm aggregate of tan soft tissue fragments, submitted in toto in a single cassette.  B. Received in formalin labeled with the patients name and "Gastric body polyp biopsies" are four 0.2-0.4 cm  pieces of tan soft tissue, submitted in toto in a single cassette.  (LEF 08/06/2023)  Final Diagnosis performed by Jimmy Picket, MD.                            Electronically signed 08/07/2023 Technical and / or Professional components performed at [facility]. [facility], [address] [place], [place] [address].  Immunohistochemistry Technical component (if applicable) was performed at [facility]. [address], [place], [place] [address].   IMMUNOHISTOCHEMISTRY DISCLAIMER (if applicable): Some of these immunohistochemical stains may have been developed and the performance characteristics determine by [facility]. Some may not have been cleared or approved by the U.S. Food and Drug Administration. The FDA has determined that such clearance or approval is not necessary. This test is used for clinical purposes. It should not be regarded as investigational or for research. This laboratory is certified under the Clinical Laboratory Improvement Amendments of 1988 (CLIA-88) as qualified to perform high complexity clinical laboratory testing.  The controls stained appropriately.                            IHC stains are performed on formalin fixed, paraffin embedded tissue using a 3,3"diaminobenzidine (DAB) chromogen and Leica Bond Autostainer System. The staining intensity of the nucleus is score manually and is reported as the percentage of tumor cell nuclei demonstrating specific nuclear staining. The specimens are fixed in 10% Neutral Formalin for at least 6 hours and up to 72hrs. These tests are validated on decalcified tissue. Results should be interpreted with caution given the possibility of false negative results on decalcified specimens. Antibody Clones are as follows ER-clone [id number], PR-clone 16, Ki67- clone MM1. Some of these immunohistochemical stains may have been developed and the performance characteristics determined by  [facility].   Lab on 08/03/2023  Component Date Value Ref Range Status   WBC 08/03/2023 3.9  3.8 - 10.8 Thousand/uL Final   RBC 08/03/2023 4.60  3.80 - 5.10 Million/uL Final   Hemoglobin 08/03/2023 13.2  11.7 - 15.5 g/dL Final   HCT 09/81/1914 41.8  35.0 - 45.0 % Final   MCV 08/03/2023 90.9  80.0 - 100.0 fL Final   MCH 08/03/2023 28.7  27.0 - 33.0 pg Final   MCHC 08/03/2023 31.6 (L)  32.0 - 36.0 g/dL Final   Comment: For adults, a slight decrease in the calculated MCHC value (in the range of 30 to 32 g/dL) is most likely not clinically significant; however, it should be interpreted with caution in correlation with other Calderon cell parameters and the patient's clinical condition.    RDW 08/03/2023 14.3  11.0 - 15.0 % Final   Platelets 08/03/2023 184  140 - 400 Thousand/uL Final   MPV 08/03/2023 11.2  7.5 - 12.5 fL Final   Neutro Abs 08/03/2023 2,309  1,500 - 7,800 cells/uL Final   Absolute Lymphocytes 08/03/2023 1,143  850 - 3,900 cells/uL Final   Absolute Monocytes 08/03/2023 398  200 - 950 cells/uL Final   Eosinophils Absolute 08/03/2023 39  15 -  500 cells/uL Final   Basophils Absolute 08/03/2023 12  0 - 200 cells/uL Final   Neutrophils Relative % 08/03/2023 59.2  % Final   Total Lymphocyte 08/03/2023 29.3  % Final   Monocytes Relative 08/03/2023 10.2  % Final   Eosinophils Relative 08/03/2023 1.0  % Final   Basophils Relative 08/03/2023 0.3  % Final   Glucose, Bld 08/03/2023 99  65 - 99 mg/dL Final   Comment: .            Fasting reference interval .    BUN 08/03/2023 15  7 - 25 mg/dL Final   Creat 32/44/0102 1.00 (H)  0.60 - 0.95 mg/dL Final   eGFR 72/53/6644 57 (L)  > OR = 60 mL/min/1.76m2 Final   BUN/Creatinine Ratio 08/03/2023 15  6 - 22 (calc) Final   Sodium 08/03/2023 145  135 - 146 mmol/L Final   Potassium 08/03/2023 4.1  3.5 - 5.3 mmol/L Final   Chloride 08/03/2023 104  98 - 110 mmol/L Final   CO2 08/03/2023 30  20 - 32 mmol/L Final   Calcium 08/03/2023  9.1  8.6 - 10.4 mg/dL Final   Total Protein 03/47/4259 7.0  6.1 - 8.1 g/dL Final   Albumin 56/38/7564 3.9  3.6 - 5.1 g/dL Final   Globulin 33/29/5188 3.1  1.9 - 3.7 g/dL (calc) Final   AG Ratio 08/03/2023 1.3  1.0 - 2.5 (calc) Final   Total Bilirubin 08/03/2023 0.6  0.2 - 1.2 mg/dL Final   Alkaline phosphatase (APISO) 08/03/2023 80  37 - 153 U/L Final   AST 08/03/2023 17  10 - 35 U/L Final   ALT 08/03/2023 11  6 - 29 U/L Final   Cholesterol 08/03/2023 134  <200 mg/dL Final   HDL 41/66/0630 42 (L)  > OR = 50 mg/dL Final   Triglycerides 16/10/930 131  <150 mg/dL Final   LDL Cholesterol (Calc) 08/03/2023 71  mg/dL (calc) Final   Comment: Reference range: <100 . Desirable range <100 mg/dL for primary prevention;   <70 mg/dL for patients with CHD or diabetic patients  with > or = 2 CHD risk factors. Marland Kitchen LDL-C is now calculated using the Martin-Hopkins  calculation, which is a validated novel method providing  better accuracy than the Friedewald equation in the  estimation of LDL-C.  Horald Pollen et al. Lenox Ahr. 3557;322(02): 2061-2068  (http://education.[facility].com/faq/FAQ164)    Total CHOL/HDL Ratio 08/03/2023 3.2  <5.4 (calc) Final   Non-HDL Cholesterol (Calc) 08/03/2023 92  <130 mg/dL (calc) Final   Comment: For patients with diabetes plus 1 major ASCVD risk  factor, treating to a non-HDL-C goal of <100 mg/dL  (LDL-C of <27 mg/dL) is considered a therapeutic  option.    Patient does report malodorous urine.  This has happened several times recently.  Therefore I recommended a urinalysis and a urine culture to determine if she does not fact have urinary tract infections.  She denies any dysuria

## 2023-08-10 ENCOUNTER — Encounter (HOSPITAL_COMMUNITY): Payer: Self-pay | Admitting: Internal Medicine

## 2023-08-10 ENCOUNTER — Other Ambulatory Visit: Payer: Medicare HMO

## 2023-08-10 DIAGNOSIS — R8290 Unspecified abnormal findings in urine: Secondary | ICD-10-CM

## 2023-08-10 LAB — URINALYSIS, ROUTINE W REFLEX MICROSCOPIC
Bilirubin Urine: NEGATIVE
Glucose, UA: NEGATIVE
Hgb urine dipstick: NEGATIVE
Ketones, ur: NEGATIVE
Leukocytes,Ua: NEGATIVE
Nitrite: NEGATIVE
Protein, ur: NEGATIVE
Specific Gravity, Urine: 1.014 (ref 1.001–1.035)
pH: 6 (ref 5.0–8.0)

## 2023-08-10 LAB — SURGICAL PATHOLOGY

## 2023-08-11 LAB — URINE CULTURE
MICRO NUMBER:: 15651144
Result:: NO GROWTH
SPECIMEN QUALITY:: ADEQUATE

## 2023-08-12 ENCOUNTER — Encounter: Payer: Self-pay | Admitting: Internal Medicine

## 2023-08-20 ENCOUNTER — Other Ambulatory Visit: Payer: Self-pay | Admitting: Family Medicine

## 2023-08-27 ENCOUNTER — Encounter: Payer: Self-pay | Admitting: Podiatry

## 2023-08-27 ENCOUNTER — Ambulatory Visit (INDEPENDENT_AMBULATORY_CARE_PROVIDER_SITE_OTHER): Payer: Medicare HMO | Admitting: Podiatry

## 2023-08-27 DIAGNOSIS — B351 Tinea unguium: Secondary | ICD-10-CM | POA: Diagnosis not present

## 2023-08-27 NOTE — Progress Notes (Signed)
Subjective:   Patient ID: Alexandra Calderon, female   DOB: 82 y.o.   MRN: [id number]   HPI Patient presents stating that she has developed discoloration on all of her nailbeds of both feet after having polish removed and was concerned   ROS      Objective:  Physical Exam  Neuro vascular status intact with discoloration of nailbeds hallux bilateral and several other nails 3 and 4 bilateral with no pain     Assessment:  Probability that this is more related to polish removal with thickness versus actual fungal infection     Plan:  8 MP reviewed she is going to go and have the nails smooth and I am hoping this will relieve the pressure and the discoloration and if symptoms were to get worse will require other treatment plan.  Explained all this to patient

## 2023-09-01 ENCOUNTER — Ambulatory Visit: Payer: Medicare HMO | Admitting: Nurse Practitioner

## 2023-09-02 ENCOUNTER — Ambulatory Visit: Payer: Medicare HMO | Admitting: Internal Medicine

## 2023-09-15 DIAGNOSIS — H43821 Vitreomacular adhesion, right eye: Secondary | ICD-10-CM | POA: Diagnosis not present

## 2023-09-15 DIAGNOSIS — Z961 Presence of intraocular lens: Secondary | ICD-10-CM | POA: Diagnosis not present

## 2023-10-02 ENCOUNTER — Other Ambulatory Visit: Payer: Self-pay | Admitting: Cardiovascular Disease

## 2023-10-11 ENCOUNTER — Other Ambulatory Visit: Payer: Self-pay | Admitting: Family Medicine

## 2023-10-19 ENCOUNTER — Encounter: Payer: Self-pay | Admitting: Cardiovascular Disease

## 2023-10-19 NOTE — Progress Notes (Signed)
 Cardiology Office Note   Date:  10/21/2023   ID:  Alexandra Calderon, Alexandra Calderon Jun 17, 1941, MRN [id number]  PCP:  Duanne Butler DASEN, MD  Cardiologist:   Aleene Passe, MD   Chief Complaint  Patient presents with   Coronary Artery Disease        Hypertension        Atrial Fibrillation        Problem List 1. Essential HTN 2. Palpitations - PVCs  3. Hyperlipidemia  4. Irritable bowel syndrome     Previous notes.   Alexandra Calderon is a 83 y.o. female who is being seen today for the evaluation of palpitations  at the request of Duanne Butler DASEN, MD.  Seen with her husband   Alexandra Calderon  Has had palpitations all of her life.    Over the years, they have been stable for years.  Dr. MARLA increased her atenolol  which seemed to have helped.   Has rare , isolated thumps  And then normal HR for a while These episodes may go on for several days Seem to be worse after eating , in the evening .  Do not limit her activity .  Not associated with weakness or dizziness, no dyspnea.    Does not get any regular exercise - tries to walk some   Aug. 20, 2018  Alexandra Calderon is here for follow up of her HTN and PVCs  Heart is now quiet. Exercising some ,  No CP or dyspnea.   Aug. 27, 2019:  Feeling better.   Not as many PVCs now  Takes atenolol  50 mg BID  Discussed weight loss   Sept. 14, 2020  Alexandra Calderon is seen for further evaluation of her palpitations No further palpitations. Walks on occasion.   Gets DOE walking up a hill.  Likely due to deconditioning .   Has several questions today .  Was seen by her primary for palpitations.   Dr. Duanne gave her Diltiazem  30 mg to take as needed.   She is still on atenolol  50 mg BID     June 26, 2020: Alexandra Calderon is seen today for follow-up visit.  She has a history of palpitations.  She also has a history of hypertension.  Walks on occasion  No CP , Has occasional palpitations  BP is normal at home  Hx of hyperlipidemia  Not fast foods for several  months ,  Wt today is 192 lbs  ( down 4 lbs from last year )  She had an abdominal CT scan which incidentally found some coronary artery calcifications.  August 19, 2021: Alexandra Calderon is seen today for a new Dx of Atrial fib and for  for follow-up of her coronary artery calcifications.  She has a history of palpitations.  Dx with atrial Fib / Atrial flutter on Aug. 6 .  Was having palpitations Apple watch said she had atrial fib   She converted back to NSR after IV Diltiazem   CHADS2VASC is 5 .   Had some liver enz elevations.   Dr. Duanne had her stop her statin   Has had a sleep study.   Does not have OAS .  Has Diltiazem  30 mg QID to take PRN atrial fib   Echo shows normal LV systolic function  Moderate pulmonary HTN  50 mmHg .  Is on aldactone   Has lost 15 lbs    Jan. 10, 2024 Alexandra Calderon is seen for further eval of her HTN, Afib, pulmonary HTN CHADS2VASC is 5 was  on aldactone  for pulmonary HTN , non smoker  Spironolactone  was stopped due to orthostatic hypotension Now BP is elevated    No specific symptoms related to her moderate pulmonary HTN No DOE with exertion  I've asked her to let me know if she has any DOE   Also has palpitations - clinically sound like PVCs  Also has longer palpitations  - sounded like PAF   Jan. 8, 2025 Alexandra Calderon is seen for follow up of her pulmonary HTN, HTN, atrial fib Afib was detected on a 48 hr monitor in Feb. 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  Will need Gamma knife surgery  She may hold her Eliquis  for 2-3 days prior to the procedure    Lipids from August 03, 2023 reveal Total cholesterol is 134 HDL is 42 LDL is 71  Past Medical History:  Diagnosis Date   Allergy    Arthritis    Benign tumor of ear canal    Breast cancer (HCC)    right   Cataract    bilateral   Colon  adenomas 2003,2006   Constipation    Diverticulosis    Dysrhythmia    Esophagitis    Fatty liver    GERD (gastroesophageal reflux disease)    Hearing loss    History of kidney stones    Hyperlipidemia    Hypertension    Irritable bowel syndrome 08/27/2012   Kidney stones    Obstructive sleep apnea (adult) (pediatric) 02/03/2023   Osteopenia    Palpitations    Personal history of radiation therapy    Pneumonia    Polyp, stomach    PVC (premature ventricular contraction)    PVC (premature ventricular contraction)    Schwannoma of nerve of head    UTI (lower urinary tract infection)     Past Surgical History:  Procedure Laterality Date   ACOUSTIC NEUROMA RESECTION     BIOPSY  08/06/2023   Procedure: BIOPSY;  Surgeon: Avram Lupita BRAVO, MD;  Location: [facility] ENDOSCOPY;  Service: Gastroenterology;;   BREAST LUMPECTOMY Right 07/01/2011   w/sentinal node bx - right side   COLONOSCOPY W/ BIOPSIES     DILATION AND CURETTAGE OF UTERUS     ESOPHAGOGASTRODUODENOSCOPY     ESOPHAGOGASTRODUODENOSCOPY (EGD) WITH PROPOFOL  N/A 08/06/2023   Procedure: ESOPHAGOGASTRODUODENOSCOPY (EGD) WITH PROPOFOL ;  Surgeon: Avram Lupita BRAVO, MD;  Location: [facility] ENDOSCOPY;  Service: Gastroenterology;  Laterality: N/A;   kidney stones     LAPAROSCOPIC CHOLECYSTECTOMY SINGLE SITE WITH INTRAOPERATIVE CHOLANGIOGRAM N/A 04/25/2021   Procedure: LAPAROSCOPIC CHOLECYSTECTOMY SINGLE SITE WITH INTRAOPERATIVE CHOLANGIOGRAM, POSSIBLE NEEDLE CORE LIVER BX;  Surgeon: Sheldon Standing, MD;  Location: [facility] ORS;  Service: General;  Laterality: N/A;   shoulder arthroscopy rt 2009     SHOULDER SURGERY       Current Outpatient Medications  Medication Sig Dispense Refill   apixaban  (ELIQUIS ) 5 MG TABS tablet Take 1 tablet (5 mg total) by mouth 2 (two) times daily. 60 tablet 6   atenolol  (TENORMIN ) 50 MG tablet TAKE 1 TABLET BY MOUTH TWICE A DAY 180 tablet 3   Cholecalciferol (VITAMIN D ) 50 MCG (2000 UT) CAPS Take 2,000 Units by mouth  daily.     diltiazem  (CARDIZEM ) 30 MG tablet Take 1 tablet every 4 hours AS NEEDED for heart rate >  100 30 tablet 4   Docusate Sodium (DSS) 100 MG CAPS Take 1 capsule by mouth daily.     losartan  (COZAAR ) 25 MG tablet TAKE 1 TABLET BY MOUTH EVERY DAY 90 tablet 2   pantoprazole  (PROTONIX ) 40 MG tablet Take 1 tablet (40 mg total) by mouth 2 (two) times daily. 180 tablet 1   Probiotic Product (PROBIOTIC DAILY PO) Take 1 tablet by mouth every morning.     rosuvastatin  (CRESTOR ) 5 MG tablet TAKE 1 TABLET BY MOUTH EVERY DAY 90 tablet 0   sucralfate  (CARAFATE ) 1 g tablet TAKE 1 TABLET BY MOUTH 2 TIMES DAILY. (Patient not taking: Reported on 10/21/2023) 60 tablet 0   Current Facility-Administered Medications  Medication Dose Route Frequency Provider Last Rate Last Admin   0.9 %  sodium chloride  infusion  500 mL Intravenous Once Avram Lupita BRAVO, MD           02/10/2017    8:50 AM  PAD Screen  Previous PAD dx? No  Previous surgical procedure? No  Pain with walking? No  Feet/toe relief with dangling? No  Painful, non-healing ulcers? No  Extremities discolored? No      Allergies:   Gatifloxacin, Nitrofurantoin  macrocrystal, Azithromycin , Levofloxacin, Sulfamethoxazole -trimethoprim , and Sulfonamide derivatives    Social History:  The patient  reports that she has never smoked. She has never used smokeless tobacco. She reports that she does not drink alcohol and does not use drugs.   Family History:  The patient's family history includes Colon cancer in her mother; Diabetes in her maternal aunt and paternal grandmother; Hodgkin's lymphoma in her father; Lymphoma in her father.    ROS:     Noted in current hx , otherwise negative    Physical Exam: Blood pressure 138/80, pulse 61, height 5' 5 (1.651 m), weight 179 lb 9.6 oz (81.5 kg), SpO2 98%.     GEN:  Well nourished, well developed in no acute distress HEENT: Normal NECK: No JVD; No carotid bruits LYMPHATICS: No  lymphadenopathy CARDIAC: RRR , no murmurs, rubs, gallops RESPIRATORY:  Clear to auscultation without rales, wheezing or rhonchi  ABDOMEN: Soft, non-tender, non-distended MUSCULOSKELETAL:  No edema; No deformity  SKIN: Warm and dry NEUROLOGIC:  Alert and oriented x 3     EKG:          Recent Labs: 08/03/2023: ALT 11; BUN 15; Creat 1.00; Hemoglobin 13.2; Platelets 184; Potassium 4.1; Sodium 145    Lipid Panel    Component Value Date/Time   CHOL 134 08/03/2023 0800   TRIG 131 08/03/2023 0800   HDL 42 (L) 08/03/2023 0800   CHOLHDL 3.2 08/03/2023 0800   VLDL 24.2 04/29/2017 0800   LDLCALC 71 08/03/2023 0800      Wt Readings from Last 3 Encounters:  10/21/23 179 lb 9.6 oz (81.5 kg)  08/07/23 176 lb (79.8 kg)  08/06/23 172 lb (78 kg)      Other studies Reviewed: Additional studies/ records that were reviewed today include: . Review of the above records demonstrates:    ASSESSMENT AND PLAN:  1.  Paroxysmal atrial fibrillation: She has episodes of paroxysmal A-fib seen on an event monitor and also recorded on her Apple Watch.  She is currently on Eliquis .  She takes diltiazem  as needed which helps suppress these episodes.  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  She has said that they will need to use a gamma knife procedure to remove this tumor.  She is at low risk  for this procedure.  If needed she may hold her Eliquis  for 2 to 3 days prior to the procedure.   2. Hypertension:   Blood pressure is well-controlled.   3.  Hyperlipidemia:   Lipids from August 03, 2023 reveal Total cholesterol is 134 HDL is 42 LDL is 71 Triglyceride level is 131.  Continue current dose of rosuvastatin .   4.  Coronary artery calcifications:  .    5.  Pulmonary hypertension:     Current medicines are reviewed at length with the patient today.  The patient does not have concerns regarding medicines.  Labs/ tests ordered today include:  No orders of the defined types  were placed in this encounter.     Disposition:       Aleene Passe, MD  10/21/2023 9:45 AM    [facility] [address] [place], [place], [place]  [address] Phone: [phone]; Fax: [phone]

## 2023-10-21 ENCOUNTER — Ambulatory Visit: Payer: Medicare HMO | Attending: Cardiovascular Disease | Admitting: Cardiovascular Disease

## 2023-10-21 ENCOUNTER — Encounter: Payer: Self-pay | Admitting: Cardiovascular Disease

## 2023-10-21 VITALS — BP 138/80 | HR 61 | Ht 65.0 in | Wt 179.6 lb

## 2023-10-21 DIAGNOSIS — I480 Paroxysmal atrial fibrillation: Secondary | ICD-10-CM | POA: Diagnosis not present

## 2023-10-21 DIAGNOSIS — I272 Pulmonary hypertension, unspecified: Secondary | ICD-10-CM | POA: Diagnosis not present

## 2023-10-21 DIAGNOSIS — I10 Essential (primary) hypertension: Secondary | ICD-10-CM

## 2023-10-21 MED ORDER — LOSARTAN POTASSIUM 25 MG PO TABS: 25.0000 mg | ORAL_TABLET | Freq: Every day | ORAL | 3 refills | Status: DC

## 2023-10-21 MED ORDER — DILTIAZEM HCL 30 MG PO TABS: ORAL_TABLET | ORAL | 4 refills | Status: DC

## 2023-10-21 NOTE — Patient Instructions (Signed)
 Medication Instructions:  Your physician recommends that you continue on your current medications as directed. Please refer to the Current Medication list given to you today.  Dr. Alveta has cleared you for your upcoming surgery. If your surgeon requests holding your Eliquis , you may do so for 2-3 days prior to your procedure if needed.  *If you need a refill on your cardiac medications before your next appointment, please call your pharmacy*  Lab Work: None ordered today.  Testing/Procedures: None ordered today.  Follow-Up: At [facility], you and your health needs are our priority.  As part of our continuing mission to provide you with exceptional heart care, we have created designated Provider Care Teams.  These Care Teams include your primary Cardiologist (physician) and Advanced Practice Providers (APPs -  Physician Assistants and Nurse Practitioners) who all work together to provide you with the care you need, when you need it.  Your next appointment:   1 year(s)  The format for your next appointment:   In Person  Provider:   Vinie Maxcy, MD {  Other Instructions For your  leg edema you  should do  the following 1. Leg elevation - I recommend the Lounge Dr. Leg rest.  See below for details  2. Salt restriction  -  Use potassium chloride  instead of regular salt as a salt substitute. 3. Walk regularly 4. Compression hose - Medical Supply store  5. Weight loss    Available on [email or web address] Or  Go to [email or web address]

## 2023-10-22 ENCOUNTER — Telehealth: Payer: Self-pay

## 2023-10-22 ENCOUNTER — Other Ambulatory Visit: Payer: Self-pay | Admitting: *Deleted

## 2023-10-22 ENCOUNTER — Telehealth (HOSPITAL_COMMUNITY): Payer: Self-pay | Admitting: *Deleted

## 2023-10-22 DIAGNOSIS — I480 Paroxysmal atrial fibrillation: Secondary | ICD-10-CM

## 2023-10-22 MED ORDER — APIXABAN 5 MG PO TABS: 5.0000 mg | ORAL_TABLET | Freq: Two times a day (BID) | ORAL | 5 refills | Status: DC

## 2023-10-22 NOTE — Addendum Note (Signed)
 Addended by: Pamala Hurry B on: 10/22/2023 10:38 AM   Modules accepted: Orders

## 2023-10-22 NOTE — Telephone Encounter (Signed)
 Pt is requesting Eliquis

## 2023-10-22 NOTE — Telephone Encounter (Signed)
 Please refill to eliquis refill encounter.

## 2023-10-22 NOTE — Telephone Encounter (Signed)
Please refer to Eliquis refill encounter.

## 2023-10-22 NOTE — Telephone Encounter (Signed)
 Eliquis 5mg  refill request received. Patient is 83 years old, weight-81.5kg, Crea-1.0 on 08/03/23, Diagnosis-Afib, and last seen by Dr. Elease Hashimoto on 10/21/23. Dose is appropriate based on dosing criteria. Will send in refill to requested pharmacy.

## 2023-10-22 NOTE — Telephone Encounter (Signed)
 Patient left message on afib voicemail requesting Eliquis refills be sent to [facility] [address]. Pt prefers 30 day supplies. Pt followed by Dr. Bernita Buffy going forward.

## 2023-11-02 ENCOUNTER — Other Ambulatory Visit: Payer: Self-pay | Admitting: Cardiovascular Disease

## 2023-11-04 ENCOUNTER — Telehealth: Payer: Self-pay | Admitting: Cardiovascular Disease

## 2023-11-04 NOTE — Telephone Encounter (Signed)
Patient dropped off Patient assistance form, for eliquis. Forms will be In Dr. Melburn Popper box by end of day, thank you Location: front desk Time: 11:10

## 2023-11-06 ENCOUNTER — Telehealth: Payer: Self-pay

## 2023-11-06 NOTE — Telephone Encounter (Signed)
PAP: Application for Eliquis has been submitted to [employer] ([employer]), via fax. If patient requests a status update in the meantime, please refer them to [employer] directly at [phone]

## 2023-11-06 NOTE — Telephone Encounter (Signed)
App received, reviewing

## 2023-11-06 NOTE — Telephone Encounter (Signed)
App received, thank you!

## 2023-11-06 NOTE — Telephone Encounter (Signed)
Forms received from [employer] box. Filled out provider portion and faxed to [facility] for processing. Called and made patient aware.

## 2023-11-11 ENCOUNTER — Encounter: Payer: Self-pay | Admitting: Dermatology

## 2023-11-11 ENCOUNTER — Ambulatory Visit (INDEPENDENT_AMBULATORY_CARE_PROVIDER_SITE_OTHER): Payer: Medicare HMO | Admitting: Dermatology

## 2023-11-11 VITALS — BP 147/72 | HR 68

## 2023-11-11 DIAGNOSIS — L720 Epidermal cyst: Secondary | ICD-10-CM

## 2023-11-11 NOTE — Progress Notes (Signed)
   New Patient Visit   Subjective  Alexandra Calderon is a 83 y.o. female who presents for the following: Consult for a cyst removal on her right neck; lesion has been present for several years. Was previously drained, and a smelly fluid came out. She states that it is not currently painful.   The patient has spots, moles and lesions to be evaluated, some may be new or changing and the patient may have concern these could be cancer.   The following portions of the chart were reviewed this encounter and updated as appropriate: medications, allergies, medical history  Review of Systems:  No other skin or systemic complaints except as noted in HPI or Assessment and Plan.  Objective  Well appearing patient in no apparent distress; mood and affect are within normal limits.   A focused examination was performed of the following areas:  Neck  Relevant exam findings are noted in the Assessment and Plan.    Assessment & Plan   EPIDERMAL INCLUSION CYST Exam: Subcutaneous nodule at right lateral neck  Benign-appearing. Exam most consistent with an epidermal inclusion cyst. Discussed that a cyst is a benign growth that can grow over time and sometimes get irritated or inflamed. Recommend observation if it is not bothersome. Discussed option of surgical excision to remove it if it is growing, symptomatic, or other changes noted. Please call for new or changing lesions so they can be evaluated. Patient would like to schedule removal and will scheduled a WLE.  Return in about 1 week (around 11/18/2023) for f/u for excision to remove cyst.  I, Bernette Redbird, Surg Tech III, am acting as scribe for Gwenith Daily, MD.   Documentation: I have reviewed the above documentation for accuracy and completeness, and I agree with the above.  Gwenith Daily, MD

## 2023-11-11 NOTE — Patient Instructions (Signed)
Important Information  Due to recent changes in healthcare laws, you may see results of your pathology and/or laboratory studies on MyChart before the doctors have had a chance to review them. We understand that in some cases there may be results that are confusing or concerning to you. Please understand that not all results are received at the same time and often the doctors may need to interpret multiple results in order to provide you with the best plan of care or course of treatment. Therefore, we ask that you please give [place] 2 business days to thoroughly review all your results before contacting the office for clarification. Should we see a critical lab result, you will be contacted sooner.   If You Need Anything After Your Visit  If you have any questions or concerns for your doctor, please call our main line at [phone] If no one answers, please leave a voicemail as directed and we will return your call as soon as possible. Messages left after 4 pm will be answered the following business day.   You may also send [place] a message via MyChart. We typically respond to MyChart messages within 1-2 business days.  For prescription refills, please ask your pharmacy to contact our office. Our fax number is [phone].  If you have an urgent issue when the clinic is closed that cannot wait until the next business day, you can page your doctor at the number below.    Please note that while we do our best to be available for urgent issues outside of office hours, we are not available 24/7.   If you have an urgent issue and are unable to reach [place], you may choose to seek medical care at your doctor's office, retail clinic, urgent care center, or emergency room.  If you have a medical emergency, please immediately call 911 or go to the emergency department. In the event of inclement weather, please call our main line at [phone] for an update on the status of any delays or  closures.  Dermatology Medication Tips: Please keep the boxes that topical medications come in in order to help keep track of the instructions about where and how to use these. Pharmacies typically print the medication instructions only on the boxes and not directly on the medication tubes.   If your medication is too expensive, please contact our office at [phone] or send [place] a message through MyChart.   We are unable to tell what your co-pay for medications will be in advance as this is different depending on your insurance coverage. However, we may be able to find a substitute medication at lower cost or fill out paperwork to get insurance to cover a needed medication.   If a prior authorization is required to get your medication covered by your insurance company, please allow [place] 1-2 business days to complete this process.  Drug prices often vary depending on where the prescription is filled and some pharmacies may offer cheaper prices.  The website www.goodrx.com contains coupons for medications through different pharmacies. The prices here do not account for what the cost may be with help from insurance (it may be cheaper with your insurance), but the website can give you the price if you did not use any insurance.  - You can print the associated coupon and take it with your prescription to the pharmacy.  - You may also stop by our office during regular business hours and pick up a GoodRx coupon card.  - If  you need your prescription sent electronically to a different pharmacy, notify our office through [facility] or by phone at [phone]

## 2023-11-13 ENCOUNTER — Other Ambulatory Visit (HOSPITAL_COMMUNITY): Payer: Self-pay

## 2023-11-13 NOTE — Telephone Encounter (Signed)
PAP: Patient has been denied for patient assistance by [employer] ([employer]) due to 3% OOP NOT MET, LETTER HAS BEEN SENT TO PATIENT

## 2023-11-19 DIAGNOSIS — Z7901 Long term (current) use of anticoagulants: Secondary | ICD-10-CM | POA: Diagnosis not present

## 2023-11-19 DIAGNOSIS — D333 Benign neoplasm of cranial nerves: Secondary | ICD-10-CM | POA: Diagnosis not present

## 2023-11-19 DIAGNOSIS — R040 Epistaxis: Secondary | ICD-10-CM | POA: Diagnosis not present

## 2023-11-24 ENCOUNTER — Encounter: Payer: Self-pay | Admitting: Dermatology

## 2023-11-25 ENCOUNTER — Encounter: Payer: Self-pay | Admitting: Dermatology

## 2023-11-25 ENCOUNTER — Ambulatory Visit: Payer: Medicare HMO | Admitting: Dermatology

## 2023-11-25 VITALS — BP 162/76 | HR 64 | Temp 97.8°F

## 2023-11-25 DIAGNOSIS — D492 Neoplasm of unspecified behavior of bone, soft tissue, and skin: Secondary | ICD-10-CM

## 2023-11-25 DIAGNOSIS — L720 Epidermal cyst: Secondary | ICD-10-CM

## 2023-11-25 DIAGNOSIS — D485 Neoplasm of uncertain behavior of skin: Secondary | ICD-10-CM

## 2023-11-25 NOTE — Patient Instructions (Signed)

## 2023-11-25 NOTE — Progress Notes (Signed)
Follow-Up Visit   Subjective  Alexandra Calderon is a 83 y.o. female who presents for the following: Excision of a neoplasm of skin on the right neck.   The following portions of the chart were reviewed this encounter and updated as appropriate: medications, allergies, medical history  Review of Systems:  No other skin or systemic complaints except as noted in HPI or Assessment and Plan.  Objective  Well appearing patient in no apparent distress; mood and affect are within normal limits.  A focused examination was performed of the following areas: Right neck Relevant physical exam findings are noted in the Assessment and Plan.     Assessment & Plan   NEOPLASM OF UNCERTAIN BEHAVIOR OF SKIN right neck Skin excision  Excision method:  elliptical Lesion length (cm):  1.1 Lesion width (cm):  1 Margin per side (cm):  0.1 Total excision diameter (cm):  1.3 Informed consent: discussed and consent obtained   Timeout: patient name, date of birth, surgical site, and procedure verified   Procedure prep:  Patient was prepped and draped in usual sterile fashion Prep type:  Chlorhexidine Anesthesia: the lesion was anesthetized in a standard fashion   Anesthetic:  1% lidocaine w/ epinephrine 1-100,000 buffered w/ 8.4% NaHCO3 Instrument used: #15 blade   Hemostasis achieved with: suture, pressure and electrodesiccation   Outcome: patient tolerated procedure well with no complications   Post-procedure details: sterile dressing applied and wound care instructions given   Dressing type: petrolatum, pressure dressing and bandage    Skin repair Complexity:  Complex Final length (cm):  2.6 Informed consent: discussed and consent obtained   Timeout: patient name, date of birth, surgical site, and procedure verified   Procedure prep:  Patient was prepped and draped in usual sterile fashion Prep type:  Chlorhexidine Anesthesia: the lesion was anesthetized in a standard fashion   Anesthetic:   1% lidocaine w/ epinephrine 1-100,000 buffered w/ 8.4% NaHCO3 Reason for type of repair: reduce tension to allow closure and preserve normal anatomy   Undermining: area extensively undermined   Subcutaneous layers (deep stitches):  Suture size:  5-0 Suture type: Monocryl (poliglecaprone 25)   Stitches:  Buried vertical mattress Fine/surface layer approximation (top stitches):  Suture size:  6-0 Suture type: fast-absorbing plain gut   Stitches: simple running   Hemostasis achieved with: pressure, aluminum chloride and electrodesiccation Outcome: patient tolerated procedure well with no complications   Post-procedure details: sterile dressing applied and wound care instructions given   Dressing type: petrolatum and pressure dressing   Specimen 1 - Surgical pathology Differential Diagnosis: R/O cyst vs other  Check Margins: No  The surgical wound was then cleaned, prepped, and re-anesthetized as above. Wound edges were undermined extensively along at least one entire edge and at a distance equal to or greater than the width of the defect (see wound defect size above) in order to achieve closure and decrease wound tension and anatomic distortion. Redundant tissue repair including standing cone removal was performed. Hemostasis was achieved with electrocautery. Subcutaneous and epidermal tissues were approximated with the above sutures. The surgical site was then lightly scrubbed with sterile, saline-soaked gauze. The area was then bandaged using Vaseline ointment, non-adherent gauze, gauze pads, and tape to provide an adequate pressure dressing. The patient tolerated the procedure well, was given detailed written and verbal wound care instructions, and was discharged in good condition.   The patient will follow-up: PRN.  Return if symptoms worsen or fail to improve.  I, Tillie Fantasia,  CMA, am acting as scribe for Gwenith Daily, MD.   Documentation: I have reviewed the above documentation for  accuracy and completeness, and I agree with the above.  Gwenith Daily, MD

## 2023-11-26 LAB — SURGICAL PATHOLOGY

## 2023-12-08 ENCOUNTER — Ambulatory Visit (INDEPENDENT_AMBULATORY_CARE_PROVIDER_SITE_OTHER): Payer: Medicare HMO | Admitting: Family Medicine

## 2023-12-08 ENCOUNTER — Encounter: Payer: Self-pay | Admitting: Family Medicine

## 2023-12-08 VITALS — BP 126/72 | HR 63 | Temp 99.1°F | Ht 65.0 in | Wt 182.4 lb

## 2023-12-08 DIAGNOSIS — R8290 Unspecified abnormal findings in urine: Secondary | ICD-10-CM | POA: Diagnosis not present

## 2023-12-08 LAB — URINALYSIS, ROUTINE W REFLEX MICROSCOPIC
Bilirubin Urine: NEGATIVE
Glucose, UA: NEGATIVE
Hyaline Cast: NONE SEEN /LPF
Ketones, ur: NEGATIVE
Nitrite: NEGATIVE
Protein, ur: NEGATIVE
Specific Gravity, Urine: 1.02 (ref 1.001–1.035)
pH: 5.5 (ref 5.0–8.0)

## 2023-12-08 LAB — MICROSCOPIC MESSAGE

## 2023-12-08 MED ORDER — CEPHALEXIN 500 MG PO CAPS: 500.0000 mg | ORAL_CAPSULE | Freq: Three times a day (TID) | ORAL | 0 refills | Status: DC

## 2023-12-08 MED ORDER — FAMOTIDINE 40 MG PO TABS: 40.0000 mg | ORAL_TABLET | Freq: Every day | ORAL | 1 refills | Status: DC

## 2023-12-08 NOTE — Progress Notes (Signed)
 Subjective:    Patient ID: Alexandra Calderon, female    DOB: 30-Aug-1941, 83 y.o.   MRN: [id number] Patient is an 83 year old female who reports of day history of dysuria increased foot see bowel other in her urine.  Urinalysis shows +1 blood, negative nitrates, trace leukocyte esterase.  However microscopic exam shows 6-10 white blood cells per high-powered field with many bacteria and very few squamous cells. Past Medical History:  Diagnosis Date   Allergy    Arthritis    Benign tumor of ear canal    Breast cancer (HCC)    right   Cataract    bilateral   Colon adenomas 2003,2006   Constipation    Diverticulosis    Dysrhythmia    Esophagitis    Fatty liver    GERD (gastroesophageal reflux disease)    Hearing loss    History of kidney stones    Hyperlipidemia    Hypertension    Irritable bowel syndrome 08/27/2012   Kidney stones    Obstructive sleep apnea (adult) (pediatric) 02/03/2023   Osteopenia    Palpitations    Personal history of radiation therapy    Pneumonia    Polyp, stomach    PVC (premature ventricular contraction)    PVC (premature ventricular contraction)    Schwannoma of nerve of head    UTI (lower urinary tract infection)    Past Surgical History:  Procedure Laterality Date   ACOUSTIC NEUROMA RESECTION     BIOPSY  08/06/2023   Procedure: BIOPSY;  Surgeon: Iva Boop, MD;  Location: [facility] ENDOSCOPY;  Service: Gastroenterology;;   BREAST LUMPECTOMY Right 07/01/2011   w/sentinal node bx - right side   COLONOSCOPY W/ BIOPSIES     DILATION AND CURETTAGE OF UTERUS     ESOPHAGOGASTRODUODENOSCOPY     ESOPHAGOGASTRODUODENOSCOPY (EGD) WITH PROPOFOL N/A 08/06/2023   Procedure: ESOPHAGOGASTRODUODENOSCOPY (EGD) WITH PROPOFOL;  Surgeon: Iva Boop, MD;  Location: [facility] ENDOSCOPY;  Service: Gastroenterology;  Laterality: N/A;   kidney stones     LAPAROSCOPIC CHOLECYSTECTOMY SINGLE SITE WITH INTRAOPERATIVE CHOLANGIOGRAM N/A 04/25/2021   Procedure: LAPAROSCOPIC  CHOLECYSTECTOMY SINGLE SITE WITH INTRAOPERATIVE CHOLANGIOGRAM, POSSIBLE NEEDLE CORE LIVER BX;  Surgeon: Karie Soda, MD;  Location: [facility] ORS;  Service: General;  Laterality: N/A;   shoulder arthroscopy rt 2009     SHOULDER SURGERY     Current Outpatient Medications on File Prior to Visit  Medication Sig Dispense Refill   apixaban (ELIQUIS) 5 MG TABS tablet Take 1 tablet (5 mg total) by mouth 2 (two) times daily. 60 tablet 5   atenolol (TENORMIN) 50 MG tablet TAKE 1 TABLET BY MOUTH TWICE A DAY 180 tablet 3   Cholecalciferol (VITAMIN D) 50 MCG (2000 UT) CAPS Take 2,000 Units by mouth daily.     diltiazem (CARDIZEM) 30 MG tablet Take 1 tablet every 4 hours AS NEEDED for heart rate >100 30 tablet 4   Docusate Sodium (DSS) 100 MG CAPS Take 1 capsule by mouth daily.     losartan (COZAAR) 25 MG tablet Take 1 tablet (25 mg total) by mouth daily. 90 tablet 3   pantoprazole (PROTONIX) 40 MG tablet Take 1 tablet (40 mg total) by mouth 2 (two) times daily. 180 tablet 1   Probiotic Product (PROBIOTIC DAILY PO) Take 1 tablet by mouth every morning.     rosuvastatin (CRESTOR) 5 MG tablet TAKE 1 TABLET BY MOUTH EVERY DAY 90 tablet 3   sucralfate (CARAFATE) 1 g tablet TAKE 1 TABLET BY  MOUTH 2 TIMES DAILY. 60 tablet 0   Current Facility-Administered Medications on File Prior to Visit  Medication Dose Route Frequency Provider Last Rate Last Admin   0.9 %  sodium chloride infusion  500 mL Intravenous Once Iva Boop, MD       Allergies  Allergen Reactions   Gatifloxacin     unknown   Nitrofurantoin Other (See Comments)    Macrobid   Nitrofurantoin Macrocrystal     unknown   Azithromycin Rash   Levofloxacin Rash    REACTION: rash    Sulfamethoxazole-Trimethoprim Rash and Other (See Comments)    Chills, fever   Sulfonamide Derivatives Rash and Other (See Comments)    Chills, fever   Social History   Socioeconomic History   Marital status: Married    Spouse name: Not on file   Number of  children: 2   Years of education: Not on file   Highest education level: Not on file  Occupational History   Occupation: retired  Tobacco Use   Smoking status: Never   Smokeless tobacco: Never   Tobacco comments:    Never smoke 12/25/21  Vaping Use   Vaping status: Never Used  Substance and Sexual Activity   Alcohol use: No   Drug use: No   Sexual activity: Yes  Other Topics Concern   Not on file  Social History Narrative   Spouse:  [name]   Children:   [name]- age 52- [place]   Ulis Rias- age 98- [place]   Social Drivers of Health   Financial Resource Strain: Low Risk  (07/30/2023)   Overall Financial Resource Strain (CARDIA)    Difficulty of Paying Living Expenses: Not hard at all  Food Insecurity: No Food Insecurity (07/30/2023)   Hunger Vital Sign    Worried About Running Out of Food in the Last Year: Never true    Ran Out of Food in the Last Year: Never true  Transportation Needs: No Transportation Needs (07/30/2023)   PRAPARE - Administrator, Civil Service (Medical): No    Lack of Transportation (Non-Medical): No  Physical Activity: Inactive (08/06/2023)   Exercise Vital Sign    Days of Exercise per Week: 0 days    Minutes of Exercise per Session: 0 min  Stress: No Stress Concern Present (08/06/2023)   [employer] of Occupational Health - Occupational Stress Questionnaire    Feeling of Stress : Only a little  Social Connections: Moderately Integrated (08/06/2023)   Social Connection and Isolation Panel [NHANES]    Frequency of Communication with Friends and Family: Once a week    Frequency of Social Gatherings with Friends and Family: Three times a week    Attends Religious Services: 1 to 4 times per year    Active Member of Clubs or Organizations: No    Attends Banker Meetings: Never    Marital Status: Married  Catering manager Violence: Not At Risk (07/30/2023)   Humiliation, Afraid, Rape, and Kick  questionnaire    Fear of Current or Ex-Partner: No    Emotionally Abused: No    Physically Abused: No    Sexually Abused: No    Review of Systems  All other systems reviewed and are negative.      Objective:   Physical Exam Constitutional:      General: She is not in acute distress.    Appearance: Normal appearance. She is normal weight. She is not ill-appearing, toxic-appearing or diaphoretic.  HENT:     Mouth/Throat:     Mouth: No oral lesions or angioedema.     Dentition: No gingival swelling or gum lesions.     Tongue: No lesions.     Tonsils: No tonsillar exudate or tonsillar abscesses.  Neck:     Thyroid: No thyroid mass.  Cardiovascular:     Rate and Rhythm: Normal rate and regular rhythm.     Pulses: Normal pulses.     Heart sounds: Normal heart sounds. No murmur heard.    No friction rub. No gallop.  Pulmonary:     Effort: Pulmonary effort is normal. No respiratory distress.     Breath sounds: Normal breath sounds. No stridor.  Abdominal:     General: Bowel sounds are normal. There is no distension.     Palpations: Abdomen is soft.     Tenderness: There is no abdominal tenderness. There is no guarding.  Musculoskeletal:     Cervical back: Normal range of motion and neck supple. No pain with movement.  Lymphadenopathy:     Cervical:     Right cervical: No superficial or deep cervical adenopathy.    Left cervical: No superficial or deep cervical adenopathy.  Neurological:     Mental Status: She is alert.           Assessment & Plan:  Malodorous urine - Plan: Urinalysis, Routine w reflex microscopic, Urine Culture Urinalysis and microscopy suggest a urinary tract infection.  Begin Keflex 500 mg 3 times daily for 7 days.

## 2023-12-10 LAB — URINE CULTURE
MICRO NUMBER:: 16128659
SPECIMEN QUALITY:: ADEQUATE

## 2023-12-22 DIAGNOSIS — H903 Sensorineural hearing loss, bilateral: Secondary | ICD-10-CM | POA: Diagnosis not present

## 2023-12-22 DIAGNOSIS — Z9889 Other specified postprocedural states: Secondary | ICD-10-CM | POA: Diagnosis not present

## 2023-12-22 DIAGNOSIS — D333 Benign neoplasm of cranial nerves: Secondary | ICD-10-CM | POA: Diagnosis not present

## 2023-12-22 DIAGNOSIS — R296 Repeated falls: Secondary | ICD-10-CM | POA: Diagnosis not present

## 2023-12-22 DIAGNOSIS — R2689 Other abnormalities of gait and mobility: Secondary | ICD-10-CM | POA: Diagnosis not present

## 2023-12-23 ENCOUNTER — Telehealth: Payer: Self-pay

## 2023-12-23 DIAGNOSIS — R040 Epistaxis: Secondary | ICD-10-CM | POA: Diagnosis not present

## 2023-12-23 DIAGNOSIS — Z7901 Long term (current) use of anticoagulants: Secondary | ICD-10-CM | POA: Diagnosis not present

## 2023-12-23 NOTE — Telephone Encounter (Signed)
 Copied from CRM [id number]. Topic: General - Call Back - No Documentation >> Dec 23, 2023  8:52 AM Franchot Heidelberg wrote: Reason for CRM: Pt is requesting to speak to Germaine Pomfret  Best contact: [phone]

## 2023-12-24 ENCOUNTER — Other Ambulatory Visit

## 2023-12-24 DIAGNOSIS — R8290 Unspecified abnormal findings in urine: Secondary | ICD-10-CM | POA: Diagnosis not present

## 2023-12-24 NOTE — Progress Notes (Unsigned)
 uau

## 2023-12-25 ENCOUNTER — Telehealth: Payer: Self-pay

## 2023-12-25 ENCOUNTER — Other Ambulatory Visit: Payer: Self-pay | Admitting: Family Medicine

## 2023-12-25 LAB — URINALYSIS, ROUTINE W REFLEX MICROSCOPIC
Bilirubin Urine: NEGATIVE
Glucose, UA: NEGATIVE
Hyaline Cast: NONE SEEN /LPF
Ketones, ur: NEGATIVE
Nitrite: POSITIVE — AB
Protein, ur: NEGATIVE
RBC / HPF: NONE SEEN /HPF (ref 0–2)
Specific Gravity, Urine: 1.013 (ref 1.001–1.035)
pH: <=5 (ref 5.0–8.0)

## 2023-12-25 LAB — MICROSCOPIC MESSAGE

## 2023-12-25 MED ORDER — CIPROFLOXACIN HCL 500 MG PO TABS: 500.0000 mg | ORAL_TABLET | Freq: Two times a day (BID) | ORAL | 0 refills | Status: AC

## 2023-12-25 NOTE — Telephone Encounter (Signed)
 Copied from CRM [id number]. Topic: Clinical - Prescription Issue >> Dec 25, 2023  9:04 AM Everette C wrote: Reason for CRM: The patient has requested direct contact with staff member Germaine Pomfret when possible about their recent prescription of ciprofloxacin (CIPRO) 500 MG tablet [[id number]] and their concerns regarding the prescription

## 2023-12-27 LAB — URINE CULTURE
MICRO NUMBER:: 16197638
SPECIMEN QUALITY:: ADEQUATE

## 2024-01-19 ENCOUNTER — Ambulatory Visit (INDEPENDENT_AMBULATORY_CARE_PROVIDER_SITE_OTHER): Admitting: Family Medicine

## 2024-01-19 ENCOUNTER — Encounter: Payer: Self-pay | Admitting: Family Medicine

## 2024-01-19 VITALS — BP 120/76 | HR 65 | Temp 97.9°F | Ht 65.0 in | Wt 181.4 lb

## 2024-01-19 DIAGNOSIS — M7989 Other specified soft tissue disorders: Secondary | ICD-10-CM | POA: Diagnosis not present

## 2024-01-19 NOTE — Progress Notes (Signed)
 Wt Readings from Last 3 Encounters:  01/19/24 181 lb 6.4 oz (82.3 kg)  12/08/23 182 lb 6.4 oz (82.7 kg)  10/21/23 179 lb 9.6 oz (81.5 kg)     Subjective:    Patient ID: Alexandra Calderon, female    DOB: 1941-02-10, 83 y.o.   MRN: [id number]  On echo 2022, patient had normal ef but elevated pulmonary artery pressure.  Recently developed swelling in both legs to the knees.  Is having more sob with activity and occasional orthopnea.  Denies chest pain or angina.  Past Medical History:  Diagnosis Date   Allergy    Arthritis    Benign tumor of ear canal    Breast cancer (HCC)    right   Cataract    bilateral   Colon adenomas 2003,2006   Constipation    Diverticulosis    Dysrhythmia    Esophagitis    Fatty liver    GERD (gastroesophageal reflux disease)    Hearing loss    History of kidney stones    Hyperlipidemia    Hypertension    Irritable bowel syndrome 08/27/2012   Kidney stones    Obstructive sleep apnea (adult) (pediatric) 02/03/2023   Osteopenia    Palpitations    Personal history of radiation therapy    Pneumonia    Polyp, stomach    PVC (premature ventricular contraction)    PVC (premature ventricular contraction)    Schwannoma of nerve of head    UTI (lower urinary tract infection)    Past Surgical History:  Procedure Laterality Date   ACOUSTIC NEUROMA RESECTION     BIOPSY  08/06/2023   Procedure: BIOPSY;  Surgeon: Iva Boop, MD;  [facility]: [facility] ENDOSCOPY;  Service: Gastroenterology;;   BREAST LUMPECTOMY Right 07/01/2011   w/sentinal node bx - right side   COLONOSCOPY W/ BIOPSIES     DILATION AND CURETTAGE OF UTERUS     ESOPHAGOGASTRODUODENOSCOPY     ESOPHAGOGASTRODUODENOSCOPY (EGD) WITH PROPOFOL N/A 08/06/2023   Procedure: ESOPHAGOGASTRODUODENOSCOPY (EGD) WITH PROPOFOL;  Surgeon: Iva Boop, MD;  Location: [facility] ENDOSCOPY;  Service: Gastroenterology;  Laterality: N/A;   kidney stones     LAPAROSCOPIC CHOLECYSTECTOMY SINGLE SITE WITH INTRAOPERATIVE  CHOLANGIOGRAM N/A 04/25/2021   Procedure: LAPAROSCOPIC CHOLECYSTECTOMY SINGLE SITE WITH INTRAOPERATIVE CHOLANGIOGRAM, POSSIBLE NEEDLE CORE LIVER BX;  Surgeon: Karie Soda, MD;  Location: [facility] ORS;  Service: General;  Laterality: N/A;   shoulder arthroscopy rt 2009     SHOULDER SURGERY     Current Outpatient Medications on File Prior to Visit  Medication Sig Dispense Refill   apixaban (ELIQUIS) 5 MG TABS tablet Take 1 tablet (5 mg total) by mouth 2 (two) times daily. 60 tablet 5   atenolol (TENORMIN) 50 MG tablet TAKE 1 TABLET BY MOUTH TWICE A DAY 180 tablet 3   cephALEXin (KEFLEX) 500 MG capsule Take 1 capsule (500 mg total) by mouth 3 (three) times daily. 21 capsule 0   Cholecalciferol (VITAMIN D) 50 MCG (2000 UT) CAPS Take 2,000 Units by mouth daily.     diltiazem (CARDIZEM) 30 MG tablet Take 1 tablet every 4 hours AS NEEDED for heart rate >100 30 tablet 4   Docusate Sodium (DSS) 100 MG CAPS Take 1 capsule by mouth daily.     famotidine (PEPCID) 40 MG tablet Take 1 tablet (40 mg total) by mouth daily. 30 tablet 1   losartan (COZAAR) 25 MG tablet Take 1 tablet (25 mg total) by mouth daily. 90 tablet 3  pantoprazole (PROTONIX) 40 MG tablet Take 1 tablet (40 mg total) by mouth 2 (two) times daily. 180 tablet 1   rosuvastatin (CRESTOR) 5 MG tablet TAKE 1 TABLET BY MOUTH EVERY DAY 90 tablet 3   sucralfate (CARAFATE) 1 g tablet TAKE 1 TABLET BY MOUTH 2 TIMES DAILY. 60 tablet 0   Current Facility-Administered Medications on File Prior to Visit  Medication Dose Route Frequency Provider Last Rate Last Admin   0.9 %  sodium chloride infusion  500 mL Intravenous Once Iva Boop, MD       Allergies  Allergen Reactions   Gatifloxacin     unknown   Nitrofurantoin Other (See Comments)    Macrobid   Nitrofurantoin Macrocrystal     unknown   Azithromycin Rash   Levofloxacin Rash    REACTION: rash    Sulfamethoxazole-Trimethoprim Rash and Other (See Comments)    Chills, fever   Sulfonamide  Derivatives Rash and Other (See Comments)    Chills, fever   Social History   Socioeconomic History   Marital status: Married    Spouse name: Not on file   Number of children: 2   Years of education: Not on file   Highest education level: Not on file  Occupational History   Occupation: retired  Tobacco Use   Smoking status: Never   Smokeless tobacco: Never   Tobacco comments:    Never smoke 12/25/21  Vaping Use   Vaping status: Never Used  Substance and Sexual Activity   Alcohol use: No   Drug use: No   Sexual activity: Yes  Other Topics Concern   Not on file  Social History Narrative   Spouse:  [name]   Children:   [name]- age 74- [place]   Ulis Rias- age 69- [place]   Social Drivers of Health   Financial Resource Strain: Low Risk  (07/30/2023)   Overall Financial Resource Strain (CARDIA)    Difficulty of Paying Living Expenses: Not hard at all  Food Insecurity: No Food Insecurity (07/30/2023)   Hunger Vital Sign    Worried About Running Out of Food in the Last Year: Never true    Ran Out of Food in the Last Year: Never true  Transportation Needs: No Transportation Needs (07/30/2023)   PRAPARE - Administrator, Civil Service (Medical): No    Lack of Transportation (Non-Medical): No  Physical Activity: Inactive (08/06/2023)   Exercise Vital Sign    Days of Exercise per Week: 0 days    Minutes of Exercise per Session: 0 min  Stress: No Stress Concern Present (08/06/2023)   [employer] of Occupational Health - Occupational Stress Questionnaire    Feeling of Stress : Only a little  Social Connections: Moderately Integrated (08/06/2023)   Social Connection and Isolation Panel [NHANES]    Frequency of Communication with Friends and Family: Once a week    Frequency of Social Gatherings with Friends and Family: Three times a week    Attends Religious Services: 1 to 4 times per year    Active Member of Clubs or Organizations: No     Attends Banker Meetings: Never    Marital Status: Married  Catering manager Violence: Not At Risk (07/30/2023)   Humiliation, Afraid, Rape, and Kick questionnaire    Fear of Current or Ex-Partner: No    Emotionally Abused: No    Physically Abused: No    Sexually Abused: No    Review of Systems  All other systems reviewed and are negative.      Objective:   Physical Exam Constitutional:      General: She is not in acute distress.    Appearance: Normal appearance. She is normal weight. She is not ill-appearing, toxic-appearing or diaphoretic.  HENT:     Mouth/Throat:     Mouth: No oral lesions or angioedema.     Dentition: No gingival swelling or gum lesions.     Tongue: No lesions.     Tonsils: No tonsillar exudate or tonsillar abscesses.  Neck:     Thyroid: No thyroid mass.  Cardiovascular:     Rate and Rhythm: Normal rate and regular rhythm.     Pulses: Normal pulses.     Heart sounds: Normal heart sounds. No murmur heard.    No friction rub. No gallop.  Pulmonary:     Effort: Pulmonary effort is normal. No respiratory distress.     Breath sounds: Normal breath sounds. No stridor.  Abdominal:     General: Bowel sounds are normal. There is no distension.     Palpations: Abdomen is soft.     Tenderness: There is no abdominal tenderness. There is no guarding.  Musculoskeletal:     Cervical back: Normal range of motion and neck supple. No pain with movement.     Right lower leg: Edema present.     Left lower leg: Edema present.  Lymphadenopathy:     Cervical:     Right cervical: No superficial or deep cervical adenopathy.    Left cervical: No superficial or deep cervical adenopathy.  Neurological:     Mental Status: She is alert.           Assessment & Plan:  Leg swelling - Plan: COMPLETE METABOLIC PANEL WITHOUT GFR, Brain natriuretic peptide Check bnp and cmp.  If elevated proceed with echo to evaluate for pulmonary hypertension..  If normal try  prn lasix for possible venous insufficiency.

## 2024-01-20 LAB — COMPLETE METABOLIC PANEL WITHOUT GFR
AG Ratio: 1.4 (calc) (ref 1.0–2.5)
ALT: 12 U/L (ref 6–29)
AST: 16 U/L (ref 10–35)
Albumin: 4 g/dL (ref 3.6–5.1)
Alkaline phosphatase (APISO): 77 U/L (ref 37–153)
BUN/Creatinine Ratio: 14 (calc) (ref 6–22)
BUN: 14 mg/dL (ref 7–25)
CO2: 31 mmol/L (ref 20–32)
Calcium: 9.1 mg/dL (ref 8.6–10.4)
Chloride: 104 mmol/L (ref 98–110)
Creat: 0.98 mg/dL — ABNORMAL HIGH (ref 0.60–0.95)
Globulin: 2.8 g/dL (ref 1.9–3.7)
Glucose, Bld: 108 mg/dL — ABNORMAL HIGH (ref 65–99)
Potassium: 4 mmol/L (ref 3.5–5.3)
Sodium: 143 mmol/L (ref 135–146)
Total Bilirubin: 0.8 mg/dL (ref 0.2–1.2)
Total Protein: 6.8 g/dL (ref 6.1–8.1)

## 2024-01-20 LAB — BRAIN NATRIURETIC PEPTIDE: Brain Natriuretic Peptide: 137 pg/mL — ABNORMAL HIGH

## 2024-01-21 ENCOUNTER — Other Ambulatory Visit: Payer: Self-pay | Admitting: Family Medicine

## 2024-01-21 DIAGNOSIS — R7989 Other specified abnormal findings of blood chemistry: Secondary | ICD-10-CM

## 2024-01-26 ENCOUNTER — Other Ambulatory Visit: Payer: Self-pay

## 2024-01-26 NOTE — Telephone Encounter (Signed)
 Prescription Request  01/26/2024  LOV: 01/19/24   What is the name of the medication or equipment? famotidine (PEPCID) 40 MG tablet [[id number]]   Have you contacted your pharmacy to request a refill? Yes   Which pharmacy would you like this sent to?  [id number] Jonette Nestle, [place] - [address] [place] [address] Phone: [phone] Fax: [phone]    Patient notified that their request is being sent to the clinical staff for review and that they should receive a response within 2 business days.   Please advise at [facility] [phone]

## 2024-01-27 MED ORDER — FAMOTIDINE 40 MG PO TABS: 40.0000 mg | ORAL_TABLET | Freq: Every day | ORAL | 1 refills | Status: DC

## 2024-01-27 NOTE — Telephone Encounter (Signed)
 Requested Prescriptions  Pending Prescriptions Disp Refills   famotidine (PEPCID) 40 MG tablet 30 tablet 1    Sig: Take 1 tablet (40 mg total) by mouth daily.     Gastroenterology:  H2 Antagonists Passed - 01/27/2024  1:59 PM      Passed - Valid encounter within last 12 months    Recent Outpatient Visits           1 week ago Leg swelling   [place] [facility] Pickard, Cisco Crest, MD   1 month ago Malodorous urine   [place] [facility] Cheril Cork, Cisco Crest, MD   5 months ago Need for vaccination   [place] [facility] Austine Lefort, MD   7 months ago Epigastric pain   [place] [facility] Cheril Cork, Cisco Crest, MD   8 months ago Esophagitis   [place] [facility] Pickard, Cisco Crest, MD

## 2024-02-09 ENCOUNTER — Ambulatory Visit (HOSPITAL_COMMUNITY): Attending: Internal Medicine

## 2024-02-09 DIAGNOSIS — I3481 Nonrheumatic mitral (valve) annulus calcification: Secondary | ICD-10-CM | POA: Insufficient documentation

## 2024-02-09 DIAGNOSIS — R7989 Other specified abnormal findings of blood chemistry: Secondary | ICD-10-CM | POA: Diagnosis present

## 2024-02-09 DIAGNOSIS — I10 Essential (primary) hypertension: Secondary | ICD-10-CM | POA: Insufficient documentation

## 2024-02-09 DIAGNOSIS — E785 Hyperlipidemia, unspecified: Secondary | ICD-10-CM | POA: Insufficient documentation

## 2024-02-09 DIAGNOSIS — I088 Other rheumatic multiple valve diseases: Secondary | ICD-10-CM

## 2024-02-11 LAB — ECHOCARDIOGRAM COMPLETE
AR max vel: 1.74 cm2
AV Area VTI: 1.83 cm2
AV Area mean vel: 1.77 cm2
AV Mean grad: 4 mmHg
AV Peak grad: 8.1 mmHg
Ao pk vel: 1.42 m/s
Area-P 1/2: 2.02 cm2
Calc EF: 70.9 %
MV VTI: 1.06 cm2
S' Lateral: 2.8 cm
Single Plane A2C EF: 63 %
Single Plane A4C EF: 76.4 %

## 2024-03-29 ENCOUNTER — Encounter: Payer: Self-pay | Admitting: Podiatry

## 2024-03-29 ENCOUNTER — Ambulatory Visit: Admitting: Podiatry

## 2024-03-29 DIAGNOSIS — D2371 Other benign neoplasm of skin of right lower limb, including hip: Secondary | ICD-10-CM

## 2024-03-30 NOTE — Progress Notes (Signed)
 She presents today chief complaint of calluses plantar aspect of the bilateral foot particularly underneath the fifth metatarsal phalangeal joint and hallux medial aspect right.  Objective: Vital signs are stable oriented x 3 pulses are palpable.  No open lesions or wounds.  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  Assessment: Benign skin neoplasms bilateral.  Plan: These were debrided today recommended that she continue debridement at home with pumice stones or sanding pads.

## 2024-04-20 ENCOUNTER — Other Ambulatory Visit: Payer: Self-pay | Admitting: Family Medicine

## 2024-05-12 ENCOUNTER — Telehealth: Payer: Self-pay

## 2024-05-12 ENCOUNTER — Telehealth: Payer: Self-pay | Admitting: Cardiovascular Disease

## 2024-05-12 DIAGNOSIS — I480 Paroxysmal atrial fibrillation: Secondary | ICD-10-CM

## 2024-05-12 MED ORDER — APIXABAN 5 MG PO TABS: 5.0000 mg | ORAL_TABLET | Freq: Two times a day (BID) | ORAL | 1 refills | Status: AC

## 2024-05-12 NOTE — Telephone Encounter (Signed)
*  STAT* If patient is at the pharmacy, call can be transferred to refill team.   1. Which medications need to be refilled? (please list name of each medication and dose if known) apixaban (ELIQUIS) 5 MG TABS tablet   2. Which pharmacy/location (including street and city if local pharmacy) is medication to be sent to?  [id number] [place] Otto, [place] - [address]    3. Do they need a 30 day or 90 day supply? 90

## 2024-05-12 NOTE — Telephone Encounter (Signed)
 Pt last saw Dr Alveta 10/21/23, last labs 01/19/24 Creat 0.98, age 83, weight 82.3kg, based on specified criteria pt is on appropriate dosage of Eliquis  5mg  BID for afib.  Will refill rx.

## 2024-05-12 NOTE — Telephone Encounter (Signed)
 Copied from CRM [id number]. Topic: Clinical - Lab/Test Results >> May 12, 2024  8:48 AM Emylou G wrote: Reason for CRM: Patient has Aug 08 2024 02:00 PM - Physical Visit -- would like to schedule labs --

## 2024-06-15 ENCOUNTER — Other Ambulatory Visit: Payer: Self-pay | Admitting: Family Medicine

## 2024-06-15 DIAGNOSIS — Z1231 Encounter for screening mammogram for malignant neoplasm of breast: Secondary | ICD-10-CM

## 2024-06-20 ENCOUNTER — Ambulatory Visit (INDEPENDENT_AMBULATORY_CARE_PROVIDER_SITE_OTHER): Admitting: Family Medicine

## 2024-06-20 ENCOUNTER — Ambulatory Visit: Admitting: Family Medicine

## 2024-06-20 VITALS — BP 120/64 | HR 86 | Temp 97.7°F | Ht 65.0 in | Wt 184.0 lb

## 2024-06-20 DIAGNOSIS — M79671 Pain in right foot: Secondary | ICD-10-CM | POA: Diagnosis not present

## 2024-06-20 MED ORDER — MECLIZINE HCL 25 MG PO TABS: 25.0000 mg | ORAL_TABLET | Freq: Three times a day (TID) | ORAL | 0 refills | Status: AC | PRN

## 2024-06-20 NOTE — Progress Notes (Signed)
 Subjective:    Patient ID: Alexandra Calderon, female    DOB: 07-05-1941, 83 y.o.   MRN: [id number] Patient complains of swelling in both legs.  Echocardiogram in April showed no evidence of congestive heart failure or severe diastolic dysfunction although she did have mild to moderate mitral stenosis.  She has +1 pitting edema from her ankles to her knees.  She has chronic venous insufficiency.  She is treating this with compression hose and that seems to be working.  She also complains of pain in her right foot.  The pain is located over the medial midfoot.  She states that Saturday she suddenly developed the pain without any reason.  She denies any falls or injuries.  She is tender to palpation over the navicular bone.  She is tender to percussion in that area.  She also has some pain with resisted inversion of the ankle and flexion of the ankle.  I believe that she likely sprained the midfoot.  There is no erythema or warmth today.  There is no palpable swelling.  Last she reports feeling dizzy.  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  She has had chronic dizziness and disequilibrium ever since.  The surgery was performed 1 year ago.  The symptoms are not improving Past Medical History:  Diagnosis Date   Allergy    Arthritis    Benign tumor of ear canal    Breast cancer (HCC)    right   Cataract    bilateral   Colon adenomas 2003,2006   Constipation    Diverticulosis    Dysrhythmia    Esophagitis    Fatty liver    GERD (gastroesophageal reflux disease)    Hearing loss    History of kidney stones    Hyperlipidemia    Hypertension    Irritable bowel syndrome 08/27/2012   Kidney stones    Obstructive sleep apnea (adult) (pediatric) 02/03/2023   Osteopenia    Palpitations    Personal history of radiation therapy    Pneumonia    Polyp, stomach    PVC (premature ventricular contraction)    PVC (premature ventricular contraction)     Schwannoma of nerve of head    UTI (lower urinary tract infection)    Past Surgical History:  Procedure Laterality Date   ACOUSTIC NEUROMA RESECTION     BIOPSY  08/06/2023   Procedure: BIOPSY;  Surgeon: Avram Lupita BRAVO, MD;  Location: [facility] ENDOSCOPY;  Service: Gastroenterology;;   BREAST LUMPECTOMY Right 07/01/2011   w/sentinal node bx - right side   COLONOSCOPY W/ BIOPSIES     DILATION AND CURETTAGE OF UTERUS     ESOPHAGOGASTRODUODENOSCOPY     ESOPHAGOGASTRODUODENOSCOPY (EGD) WITH PROPOFOL  N/A 08/06/2023   Procedure: ESOPHAGOGASTRODUODENOSCOPY (EGD) WITH PROPOFOL ;  Surgeon: Avram Lupita BRAVO, MD;  Location: [facility] ENDOSCOPY;  Service: Gastroenterology;  Laterality: N/A;   kidney stones     LAPAROSCOPIC CHOLECYSTECTOMY SINGLE SITE WITH INTRAOPERATIVE CHOLANGIOGRAM N/A 04/25/2021   Procedure: LAPAROSCOPIC CHOLECYSTECTOMY SINGLE SITE WITH INTRAOPERATIVE CHOLANGIOGRAM, POSSIBLE NEEDLE CORE LIVER BX;  Surgeon: Sheldon Standing, MD;  Location: [facility] ORS;  Service: General;  Laterality: N/A;   shoulder arthroscopy rt 2009     SHOULDER SURGERY     Current Outpatient Medications on File Prior to Visit  Medication Sig Dispense Refill   apixaban  (ELIQUIS ) 5 MG TABS tablet Take 1 tablet (5 mg total) by mouth 2 (two) times daily. 180 tablet 1  atenolol  (TENORMIN ) 50 MG tablet TAKE 1 TABLET BY MOUTH TWICE A DAY 180 tablet 3   cephALEXin  (KEFLEX ) 500 MG capsule Take 1 capsule (500 mg total) by mouth 3 (three) times daily. 21 capsule 0   Cholecalciferol (VITAMIN D ) 50 MCG (2000 UT) CAPS Take 2,000 Units by mouth daily.     diltiazem  (CARDIZEM ) 30 MG tablet Take 1 tablet every 4 hours AS NEEDED for heart rate >100 30 tablet 4   Docusate Sodium (DSS) 100 MG CAPS Take 1 capsule by mouth daily.     famotidine  (PEPCID ) 40 MG tablet Take 1 tablet (40 mg total) by mouth daily. 30 tablet 1   losartan  (COZAAR ) 25 MG tablet Take 1 tablet (25 mg total) by mouth daily. 90 tablet 3   pantoprazole  (PROTONIX ) 40 MG tablet TAKE  1 TABLET BY MOUTH TWICE A DAY 180 tablet 1   rosuvastatin  (CRESTOR ) 5 MG tablet TAKE 1 TABLET BY MOUTH EVERY DAY 90 tablet 3   sucralfate  (CARAFATE ) 1 g tablet TAKE 1 TABLET BY MOUTH 2 TIMES DAILY. 60 tablet 0   Current Facility-Administered Medications on File Prior to Visit  Medication Dose Route Frequency Provider Last Rate Last Admin   0.9 %  sodium chloride  infusion  500 mL Intravenous Once Avram Lupita BRAVO, MD       Allergies  Allergen Reactions   Gatifloxacin     unknown   Nitrofurantoin  Other (See Comments)    Macrobid    Nitrofurantoin  Macrocrystal     unknown   Azithromycin  Rash   Levofloxacin Rash    REACTION: rash    Sulfamethoxazole -Trimethoprim  Rash and Other (See Comments)    Chills, fever   Sulfonamide Derivatives Rash and Other (See Comments)    Chills, fever   Social History   Socioeconomic History   Marital status: Married    Spouse name: Not on file   Number of children: 2   Years of education: Not on file   Highest education level: Not on file  Occupational History   Occupation: retired  Tobacco Use   Smoking status: Never   Smokeless tobacco: Never   Tobacco comments:    Never smoke 12/25/21  Vaping Use   Vaping status: Never Used  Substance and Sexual Activity   Alcohol use: No   Drug use: No   Sexual activity: Yes  Other Topics Concern   Not on file  Social History Narrative   Spouse:  Alexandra Calderon   Children:   Alexandra Calderon- age 53- [place]   Alexandra Calderon- age 74- [place]   Social Drivers of Health   Financial Resource Strain: Low Risk  (07/30/2023)   Overall Financial Resource Strain (CARDIA)    Difficulty of Paying Living Expenses: Not hard at all  Food Insecurity: No Food Insecurity (07/30/2023)   Hunger Vital Sign    Worried About Running Out of Food in the Last Year: Never true    Ran Out of Food in the Last Year: Never true  Transportation Needs: No Transportation Needs (07/30/2023)   PRAPARE - Scientist, research (physical sciences) (Medical): No    Lack of Transportation (Non-Medical): No  Physical Activity: Inactive (08/06/2023)   Exercise Vital Sign    Days of Exercise per Week: 0 days    Minutes of Exercise per Session: 0 min  Stress: No Stress Concern Present (08/06/2023)   [employer] of Occupational Health - Occupational Stress Questionnaire    Feeling of Stress : Only a  little  Social Connections: Moderately Integrated (08/06/2023)   Social Connection and Isolation Panel    Frequency of Communication with Friends and Family: Once a week    Frequency of Social Gatherings with Friends and Family: Three times a week    Attends Religious Services: 1 to 4 times per year    Active Member of Clubs or Organizations: No    Attends Banker Meetings: Never    Marital Status: Married  Catering manager Violence: Not At Risk (07/30/2023)   Humiliation, Afraid, Rape, and Kick questionnaire    Fear of Current or Ex-Partner: No    Emotionally Abused: No    Physically Abused: No    Sexually Abused: No    Review of Systems  All other systems reviewed and are negative.      Objective:   Physical Exam Constitutional:      General: She is not in acute distress.    Appearance: Normal appearance. She is normal weight. She is not ill-appearing, toxic-appearing or diaphoretic.  HENT:     Mouth/Throat:     Mouth: No oral lesions or angioedema.     Dentition: No gingival swelling or gum lesions.     Tongue: No lesions.     Tonsils: No tonsillar exudate or tonsillar abscesses.  Neck:     Thyroid : No thyroid  mass.  Cardiovascular:     Rate and Rhythm: Normal rate and regular rhythm.     Pulses: Normal pulses.          Dorsalis pedis pulses are 2+ on the right side and 2+ on the left side.       Posterior tibial pulses are 2+ on the right side and 2+ on the left side.     Heart sounds: Normal heart sounds. No murmur heard.    No friction rub. No gallop.  Pulmonary:     Effort:  Pulmonary effort is normal. No respiratory distress.     Breath sounds: Normal breath sounds. No stridor.  Abdominal:     General: Bowel sounds are normal. There is no distension.     Palpations: Abdomen is soft.     Tenderness: There is no abdominal tenderness. There is no guarding.  Musculoskeletal:     Cervical back: Normal range of motion and neck supple. No pain with movement.     Right lower leg: Edema present.     Left lower leg: Edema present.     Right foot: Normal range of motion. No deformity, foot drop or prominent metatarsal heads.     Left foot: Normal range of motion. No deformity or prominent metatarsal heads.       Feet:  Feet:     Right foot:     Skin integrity: No ulcer, blister, skin breakdown, erythema or warmth.     Left foot:     Skin integrity: No ulcer, blister, skin breakdown, erythema or warmth.  Lymphadenopathy:     Cervical:     Right cervical: No superficial or deep cervical adenopathy.    Left cervical: No superficial or deep cervical adenopathy.  Neurological:     Mental Status: She is alert.           Assessment & Plan:  Right foot pain - Plan: DG Foot Complete Right I believe the patient suffered a sprain in her midfoot.  I recommended weightbearing as tolerated.  Anticipate symptoms will gradually improve over the next 2 weeks.  Recommended an x-ray given the tenderness over  the navicular bone.  I believe the swelling in her legs is due to chronic venous insufficiency.  At the present time she declines diuretics to be used as needed.  Recommended compression hose to help manage the swelling.  I believe the disequilibrium was going likely be a chronic problem for the patient.  She can try meclizine  to see if this symptomatically helps with some of the disequilibrium.  We discussed physical therapy but she declines this.

## 2024-06-27 ENCOUNTER — Other Ambulatory Visit: Payer: Self-pay | Admitting: Family Medicine

## 2024-06-28 NOTE — Telephone Encounter (Signed)
 Requested Prescriptions  Pending Prescriptions Disp Refills   famotidine  (PEPCID ) 40 MG tablet [Pharmacy Med Name: FAMOTIDINE  40 MG TABLET] 90 tablet 1    Sig: TAKE 1 TABLET BY MOUTH EVERY DAY     Gastroenterology:  H2 Antagonists Passed - 06/28/2024  5:10 PM      Passed - Valid encounter within last 12 months    Recent Outpatient Visits           1 week ago Right foot pain   [place] [facility] Duanne, Butler DASEN, MD   5 months ago Leg swelling   [place] [facility] Duanne, Butler DASEN, MD   6 months ago Malodorous urine   [place] [facility] Duanne, Butler DASEN, MD   10 months ago Need for vaccination   [facility] Duanne Butler DASEN, MD   1 year ago Epigastric pain   [facility] Pickard, Butler DASEN, MD

## 2024-07-15 ENCOUNTER — Ambulatory Visit (INDEPENDENT_AMBULATORY_CARE_PROVIDER_SITE_OTHER): Admitting: Family Medicine

## 2024-07-15 ENCOUNTER — Encounter: Payer: Self-pay | Admitting: Family Medicine

## 2024-07-15 VITALS — BP 120/62 | HR 60 | Temp 98.2°F | Ht 65.0 in | Wt 186.0 lb

## 2024-07-15 DIAGNOSIS — Z23 Encounter for immunization: Secondary | ICD-10-CM

## 2024-07-15 DIAGNOSIS — R8290 Unspecified abnormal findings in urine: Secondary | ICD-10-CM

## 2024-07-15 LAB — URINALYSIS, ROUTINE W REFLEX MICROSCOPIC
Bacteria, UA: NONE SEEN /HPF
Bilirubin Urine: NEGATIVE
Glucose, UA: NEGATIVE
Hyaline Cast: NONE SEEN /LPF
Ketones, ur: NEGATIVE
Leukocytes,Ua: NEGATIVE
Nitrite: NEGATIVE
Protein, ur: NEGATIVE
Specific Gravity, Urine: 1.02 (ref 1.001–1.035)
WBC, UA: NONE SEEN /HPF (ref 0–5)
pH: 6 (ref 5.0–8.0)

## 2024-07-15 LAB — MICROSCOPIC MESSAGE

## 2024-07-15 NOTE — Addendum Note (Signed)
 Addended by: ANGELENA RONAL BRADLEY K on: 07/15/2024 11:19 AM   Modules accepted: Orders

## 2024-07-15 NOTE — Progress Notes (Signed)
 Subjective:    Patient ID: Alexandra Calderon, female    DOB: [birth year], 83 y.o.   MRN: [id number] On Tuesday, the patient noticed a foul odor to her urine.  She denies any dysuria frequency or urgency.  She states that her urine smells like car exhaust.  On Wednesday the odor persisted.  Thursday and today the odor has subsided.  She feels back to normal.  She again denies any dysuria or urgency or frequency or abdominal pain.  Urinalysis shows +1 blood but is otherwise negative Past Medical History:  Diagnosis Date  . Allergy   . Arthritis   . Benign tumor of ear canal   . Breast cancer (HCC)    right  . Cataract    bilateral  . Colon adenomas 7996,7993  . Constipation   . Diverticulosis   . Dysrhythmia   . Esophagitis   . Fatty liver   . GERD (gastroesophageal reflux disease)   . Hearing loss   . History of kidney stones   . Hyperlipidemia   . Hypertension   . Irritable bowel syndrome 08/27/2012  . Kidney stones   . Obstructive sleep apnea (adult) (pediatric) 02/03/2023  . Osteopenia   . Palpitations   . Personal history of radiation therapy   . Pneumonia   . Polyp, stomach   . PVC (premature ventricular contraction)   . PVC (premature ventricular contraction)   . Schwannoma of nerve of head   . UTI (lower urinary tract infection)    Past Surgical History:  Procedure Laterality Date  . ACOUSTIC NEUROMA RESECTION    . BIOPSY  08/06/2023   Procedure: BIOPSY;  Surgeon: Avram Lupita BRAVO, MD;  Location: [facility] ENDOSCOPY;  Service: Gastroenterology;;  . BREAST LUMPECTOMY Right 07/01/2011   w/sentinal node bx - right side  . COLONOSCOPY W/ BIOPSIES    . DILATION AND CURETTAGE OF UTERUS    . ESOPHAGOGASTRODUODENOSCOPY    . ESOPHAGOGASTRODUODENOSCOPY (EGD) WITH PROPOFOL  N/A 08/06/2023   Procedure: ESOPHAGOGASTRODUODENOSCOPY (EGD) WITH PROPOFOL ;  Surgeon: Avram Lupita BRAVO, MD;  Location: [facility] ENDOSCOPY;  Service: Gastroenterology;  Laterality: N/A;  . kidney stones    .  LAPAROSCOPIC CHOLECYSTECTOMY SINGLE SITE WITH INTRAOPERATIVE CHOLANGIOGRAM N/A 04/25/2021   Procedure: LAPAROSCOPIC CHOLECYSTECTOMY SINGLE SITE WITH INTRAOPERATIVE CHOLANGIOGRAM, POSSIBLE NEEDLE CORE LIVER BX;  Surgeon: Sheldon Standing, MD;  Location: [facility] ORS;  Service: General;  Laterality: N/A;  . shoulder arthroscopy rt 2009    . SHOULDER SURGERY     Current Outpatient Medications on File Prior to Visit  Medication Sig Dispense Refill  . apixaban  (ELIQUIS ) 5 MG TABS tablet Take 1 tablet (5 mg total) by mouth 2 (two) times daily. 180 tablet 1  . atenolol  (TENORMIN ) 50 MG tablet TAKE 1 TABLET BY MOUTH TWICE A DAY 180 tablet 3  . Cholecalciferol (VITAMIN D ) 50 MCG (2000 UT) CAPS Take 2,000 Units by mouth daily.    . diltiazem  (CARDIZEM ) 30 MG tablet Take 1 tablet every 4 hours AS NEEDED for heart rate >100 30 tablet 4  . Docusate Sodium (DSS) 100 MG CAPS Take 1 capsule by mouth daily.    . famotidine  (PEPCID ) 40 MG tablet TAKE 1 TABLET BY MOUTH EVERY DAY 90 tablet 1  . losartan  (COZAAR ) 25 MG tablet Take 1 tablet (25 mg total) by mouth daily. 90 tablet 3  . meclizine  (ANTIVERT ) 25 MG tablet Take 1 tablet (25 mg total) by mouth 3 (three) times daily as needed for dizziness. 30 tablet 0  .  pantoprazole  (PROTONIX ) 40 MG tablet TAKE 1 TABLET BY MOUTH TWICE A DAY 180 tablet 1  . rosuvastatin  (CRESTOR ) 5 MG tablet TAKE 1 TABLET BY MOUTH EVERY DAY 90 tablet 3  . cephALEXin  (KEFLEX ) 500 MG capsule Take 1 capsule (500 mg total) by mouth 3 (three) times daily. (Patient not taking: Reported on 07/15/2024) 21 capsule 0  . sucralfate  (CARAFATE ) 1 g tablet TAKE 1 TABLET BY MOUTH 2 TIMES DAILY. (Patient not taking: Reported on 07/15/2024) 60 tablet 0   Current Facility-Administered Medications on File Prior to Visit  Medication Dose Route Frequency Provider Last Rate Last Admin  . 0.9 %  sodium chloride  infusion  500 mL Intravenous Once Avram Lupita BRAVO, MD       Allergies  Allergen Reactions  . Gatifloxacin      unknown  . Nitrofurantoin  Other (See Comments)    Macrobid   . Nitrofurantoin  Macrocrystal     unknown  . Azithromycin  Rash  . Levofloxacin Rash    REACTION: rash   . Sulfamethoxazole -Trimethoprim  Rash and Other (See Comments)    Chills, fever  . Sulfonamide Derivatives Rash and Other (See Comments)    Chills, fever   Social History   Socioeconomic History  . Marital status: Married    Spouse name: Not on file  . Number of children: 2  . Years of education: Not on file  . Highest education level: Not on file  Occupational History  . Occupation: retired  Tobacco Use  . Smoking status: Never  . Smokeless tobacco: Never  . Tobacco comments:    Never smoke 12/25/21  Vaping Use  . Vaping status: Never Used  Substance and Sexual Activity  . Alcohol use: No  . Drug use: No  . Sexual activity: Yes  Other Topics Concern  . Not on file  Social History Narrative   Spouse:  [name]   Children:   [name]- age 54- [place]   [name]- age 79- [place]   Social Drivers of Health   Financial Resource Strain: Low Risk  (07/30/2023)   Overall Financial Resource Strain (CARDIA)   . Difficulty of Paying Living Expenses: Not hard at all  Food Insecurity: No Food Insecurity (07/30/2023)   Hunger Vital Sign   . Worried About Programme researcher, broadcasting/film/video in the Last Year: Never true   . Ran Out of Food in the Last Year: Never true  Transportation Needs: No Transportation Needs (07/30/2023)   PRAPARE - Transportation   . Lack of Transportation (Medical): No   . Lack of Transportation (Non-Medical): No  Physical Activity: Inactive (08/06/2023)   Exercise Vital Sign   . Days of Exercise per Week: 0 days   . Minutes of Exercise per Session: 0 min  Stress: No Stress Concern Present (08/06/2023)   [employer] of Occupational Health - Occupational Stress Questionnaire   . Feeling of Stress : Only a little  Social Connections: Moderately Integrated (08/06/2023)   Social  Connection and Isolation Panel   . Frequency of Communication with Friends and Family: Once a week   . Frequency of Social Gatherings with Friends and Family: Three times a week   . Attends Religious Services: 1 to 4 times per year   . Active Member of Clubs or Organizations: No   . Attends Banker Meetings: Never   . Marital Status: Married  Catering manager Violence: Not At Risk (07/30/2023)   Humiliation, Afraid, Rape, and Kick questionnaire   . Fear of  Current or Ex-Partner: No   . Emotionally Abused: No   . Physically Abused: No   . Sexually Abused: No    Review of Systems  All other systems reviewed and are negative.      Objective:   Physical Exam Constitutional:      General: She is not in acute distress.    Appearance: Normal appearance. She is normal weight. She is not ill-appearing, toxic-appearing or diaphoretic.  HENT:     Mouth/Throat:     Mouth: No oral lesions or angioedema.     Dentition: No gingival swelling or gum lesions.     Tongue: No lesions.     Tonsils: No tonsillar exudate or tonsillar abscesses.  Neck:     Thyroid : No thyroid  mass.  Cardiovascular:     Rate and Rhythm: Normal rate and regular rhythm.     Pulses: Normal pulses.     Heart sounds: Normal heart sounds. No murmur heard.    No friction rub. No gallop.  Pulmonary:     Effort: Pulmonary effort is normal. No respiratory distress.     Breath sounds: Normal breath sounds. No stridor.  Abdominal:     General: Bowel sounds are normal. There is no distension.     Palpations: Abdomen is soft.     Tenderness: There is no abdominal tenderness. There is no guarding.  Musculoskeletal:     Cervical back: No pain with movement.  Lymphadenopathy:     Cervical:     Right cervical: No superficial or deep cervical adenopathy.    Left cervical: No superficial or deep cervical adenopathy.  Neurological:     Mental Status: She is alert.           Assessment & Plan:  Malodorous  urine - Plan: Urinalysis, Routine w reflex microscopic Urinalysis shows no evidence of a UTI.  No treatment is indicated at the present time.  Recommended a flu shot today.

## 2024-07-26 ENCOUNTER — Other Ambulatory Visit: Payer: Self-pay | Admitting: Family Medicine

## 2024-07-28 ENCOUNTER — Ambulatory Visit
Admission: RE | Admit: 2024-07-28 | Discharge: 2024-07-28 | Disposition: A | Discharge status: Home / Self Care | Source: Ambulatory Visit | Attending: Family Medicine | Admitting: Family Medicine

## 2024-07-28 DIAGNOSIS — Z1231 Encounter for screening mammogram for malignant neoplasm of breast: Secondary | ICD-10-CM

## 2024-08-02 ENCOUNTER — Other Ambulatory Visit

## 2024-08-02 ENCOUNTER — Telehealth: Payer: Self-pay | Admitting: Family Medicine

## 2024-08-02 ENCOUNTER — Ambulatory Visit: Admitting: Physician Assistant

## 2024-08-02 DIAGNOSIS — Z0000 Encounter for general adult medical examination without abnormal findings: Secondary | ICD-10-CM | POA: Diagnosis not present

## 2024-08-02 DIAGNOSIS — E7800 Pure hypercholesterolemia, unspecified: Secondary | ICD-10-CM | POA: Diagnosis not present

## 2024-08-02 NOTE — Telephone Encounter (Signed)
 Patient came to the office to report an issue she had while in church; watch stated her heart rate was below 50 (reading was 43). Patient stated she stopped taking her atenolol  Sunday night; now it's running between 55-61 bpm. Patient has scheduled an appointment with the provider for this Thursday; requesting call back with next best steps until then. Please advise at [phone].

## 2024-08-03 DIAGNOSIS — L820 Inflamed seborrheic keratosis: Secondary | ICD-10-CM | POA: Diagnosis not present

## 2024-08-03 DIAGNOSIS — L728 Other follicular cysts of the skin and subcutaneous tissue: Secondary | ICD-10-CM | POA: Diagnosis not present

## 2024-08-03 DIAGNOSIS — B078 Other viral warts: Secondary | ICD-10-CM | POA: Diagnosis not present

## 2024-08-03 LAB — COMPREHENSIVE METABOLIC PANEL WITH GFR
AG Ratio: 1.4 (calc) (ref 1.0–2.5)
ALT: 12 U/L (ref 6–29)
AST: 17 U/L (ref 10–35)
Albumin: 4.2 g/dL (ref 3.6–5.1)
Alkaline phosphatase (APISO): 85 U/L (ref 37–153)
BUN/Creatinine Ratio: 15 (calc) (ref 6–22)
BUN: 15 mg/dL (ref 7–25)
CO2: 30 mmol/L (ref 20–32)
Calcium: 9.2 mg/dL (ref 8.6–10.4)
Chloride: 101 mmol/L (ref 98–110)
Creat: 0.99 mg/dL — ABNORMAL HIGH (ref 0.60–0.95)
Globulin: 2.9 g/dL (ref 1.9–3.7)
Glucose, Bld: 96 mg/dL (ref 65–99)
Potassium: 4.3 mmol/L (ref 3.5–5.3)
Sodium: 140 mmol/L (ref 135–146)
Total Bilirubin: 0.8 mg/dL (ref 0.2–1.2)
Total Protein: 7.1 g/dL (ref 6.1–8.1)
eGFR: 57 mL/min/{1.73_m2} — ABNORMAL LOW

## 2024-08-03 LAB — CBC WITH DIFFERENTIAL/PLATELET
Absolute Lymphocytes: 1399 {cells}/uL (ref 850–3900)
Absolute Monocytes: 440 {cells}/uL (ref 200–950)
Basophils Absolute: 22 {cells}/uL (ref 0–200)
Basophils Relative: 0.5 %
Eosinophils Absolute: 48 {cells}/uL (ref 15–500)
Eosinophils Relative: 1.1 %
HCT: 41.3 % (ref 35.0–45.0)
Hemoglobin: 13.3 g/dL (ref 11.7–15.5)
MCH: 29.4 pg (ref 27.0–33.0)
MCHC: 32.2 g/dL (ref 32.0–36.0)
MCV: 91.2 fL (ref 80.0–100.0)
MPV: 11.1 fL (ref 7.5–12.5)
Monocytes Relative: 10 %
Neutro Abs: 2490 {cells}/uL (ref 1500–7800)
Neutrophils Relative %: 56.6 %
Platelets: 206 10*3/uL (ref 140–400)
RBC: 4.53 Million/uL (ref 3.80–5.10)
RDW: 14.1 % (ref 11.0–15.0)
Total Lymphocyte: 31.8 %
WBC: 4.4 10*3/uL (ref 3.8–10.8)

## 2024-08-03 LAB — LIPID PANEL
Cholesterol: 137 mg/dL
HDL: 44 mg/dL — ABNORMAL LOW
LDL Cholesterol (Calc): 75 mg/dL
Non-HDL Cholesterol (Calc): 93 mg/dL
Total CHOL/HDL Ratio: 3.1 (calc)
Triglycerides: 96 mg/dL

## 2024-08-03 LAB — TSH: TSH: 3.9 m[IU]/L (ref 0.40–4.50)

## 2024-08-04 ENCOUNTER — Ambulatory Visit: Payer: Medicare HMO

## 2024-08-04 ENCOUNTER — Encounter: Payer: Self-pay | Admitting: Family Medicine

## 2024-08-04 ENCOUNTER — Ambulatory Visit: Admitting: Family Medicine

## 2024-08-04 ENCOUNTER — Ambulatory Visit: Payer: Self-pay | Admitting: Family Medicine

## 2024-08-04 VITALS — BP 124/62 | HR 68 | Temp 97.8°F | Ht 65.0 in | Wt 186.6 lb

## 2024-08-04 DIAGNOSIS — R001 Bradycardia, unspecified: Secondary | ICD-10-CM | POA: Diagnosis not present

## 2024-08-04 NOTE — Progress Notes (Signed)
 Subjective:    Patient ID: Alexandra Calderon, female    DOB: 1941/09/01, 83 y.o.   MRN: [id number] Patient has been on the 100 mg of atenolol  for palpitations for years.  Recently, she found that she was having bradycardia with heart rates in the low 40s.  As a result, she discontinued the atenolol  altogether about 48 hours ago.  Her heart rate has improved up to 68 bpm.  She denies any syncope or near syncope. Past Medical History:  Diagnosis Date   Allergy    Arthritis    Benign tumor of ear canal    Breast cancer (HCC)    right   Cataract    bilateral   Colon adenomas 2003,2006   Constipation    Diverticulosis    Dysrhythmia    Esophagitis    Fatty liver    GERD (gastroesophageal reflux disease)    Hearing loss    History of kidney stones    Hyperlipidemia    Hypertension    Irritable bowel syndrome 08/27/2012   Kidney stones    Obstructive sleep apnea (adult) (pediatric) 02/03/2023   Osteopenia    Palpitations    Personal history of radiation therapy    Pneumonia    Polyp, stomach    PVC (premature ventricular contraction)    PVC (premature ventricular contraction)    Schwannoma of nerve of head    UTI (lower urinary tract infection)    Past Surgical History:  Procedure Laterality Date   ACOUSTIC NEUROMA RESECTION     BIOPSY  08/06/2023   Procedure: BIOPSY;  Surgeon: Avram Lupita BRAVO, MD;  Location: [facility] ENDOSCOPY;  Service: Gastroenterology;;   BREAST LUMPECTOMY Right 07/01/2011   w/sentinal node bx - right side   COLONOSCOPY W/ BIOPSIES     DILATION AND CURETTAGE OF UTERUS     ESOPHAGOGASTRODUODENOSCOPY     ESOPHAGOGASTRODUODENOSCOPY (EGD) WITH PROPOFOL  N/A 08/06/2023   Procedure: ESOPHAGOGASTRODUODENOSCOPY (EGD) WITH PROPOFOL ;  Surgeon: Avram Lupita BRAVO, MD;  Location: [facility] ENDOSCOPY;  Service: Gastroenterology;  Laterality: N/A;   kidney stones     LAPAROSCOPIC CHOLECYSTECTOMY SINGLE SITE WITH INTRAOPERATIVE CHOLANGIOGRAM N/A 04/25/2021   Procedure: LAPAROSCOPIC  CHOLECYSTECTOMY SINGLE SITE WITH INTRAOPERATIVE CHOLANGIOGRAM, POSSIBLE NEEDLE CORE LIVER BX;  Surgeon: Sheldon Standing, MD;  Location: [facility] ORS;  Service: General;  Laterality: N/A;   shoulder arthroscopy rt 2009     SHOULDER SURGERY     Current Outpatient Medications on File Prior to Visit  Medication Sig Dispense Refill   apixaban  (ELIQUIS ) 5 MG TABS tablet Take 1 tablet (5 mg total) by mouth 2 (two) times daily. 180 tablet 1   Cholecalciferol (VITAMIN D ) 50 MCG (2000 UT) CAPS Take 2,000 Units by mouth daily.     diltiazem  (CARDIZEM ) 30 MG tablet Take 1 tablet every 4 hours AS NEEDED for heart rate >100 30 tablet 4   Docusate Sodium (DSS) 100 MG CAPS Take 1 capsule by mouth daily.     famotidine  (PEPCID ) 40 MG tablet TAKE 1 TABLET BY MOUTH EVERY DAY 90 tablet 1   losartan  (COZAAR ) 25 MG tablet Take 1 tablet (25 mg total) by mouth daily. 90 tablet 3   meclizine  (ANTIVERT ) 25 MG tablet Take 1 tablet (25 mg total) by mouth 3 (three) times daily as needed for dizziness. 30 tablet 0   pantoprazole  (PROTONIX ) 40 MG tablet TAKE 1 TABLET BY MOUTH TWICE A DAY 180 tablet 1   rosuvastatin  (CRESTOR ) 5 MG tablet TAKE 1 TABLET BY MOUTH EVERY  DAY 90 tablet 3   atenolol  (TENORMIN ) 50 MG tablet TAKE 1 TABLET BY MOUTH TWICE A DAY (Patient not taking: Reported on 08/04/2024) 180 tablet 3   cephALEXin  (KEFLEX ) 500 MG capsule Take 1 capsule (500 mg total) by mouth 3 (three) times daily. (Patient not taking: Reported on 07/15/2024) 21 capsule 0   sucralfate  (CARAFATE ) 1 g tablet TAKE 1 TABLET BY MOUTH 2 TIMES DAILY. (Patient not taking: Reported on 07/15/2024) 60 tablet 0   Current Facility-Administered Medications on File Prior to Visit  Medication Dose Route Frequency Provider Last Rate Last Admin   0.9 %  sodium chloride  infusion  500 mL Intravenous Once Avram Lupita BRAVO, MD       Allergies  Allergen Reactions   Gatifloxacin     unknown   Nitrofurantoin  Other (See Comments)    Macrobid    Nitrofurantoin   Macrocrystal     unknown   Azithromycin  Rash   Levofloxacin Rash    REACTION: rash    Sulfamethoxazole -Trimethoprim  Rash and Other (See Comments)    Chills, fever   Sulfonamide Derivatives Rash and Other (See Comments)    Chills, fever   Social History   Socioeconomic History   Marital status: Married    Spouse name: Not on file   Number of children: 2   Years of education: Not on file   Highest education level: Not on file  Occupational History   Occupation: retired  Tobacco Use   Smoking status: Never   Smokeless tobacco: Never   Tobacco comments:    Never smoke 12/25/21  Vaping Use   Vaping status: Never Used  Substance and Sexual Activity   Alcohol use: No   Drug use: No   Sexual activity: Yes  Other Topics Concern   Not on file  Social History Narrative   Spouse:  Alexandra Calderon   Children:   Alexandra Calderon- age 32- [place]   Alexandra Calderon- age 81- [place]   Social Drivers of Health   Financial Resource Strain: Low Risk  (07/30/2023)   Overall Financial Resource Strain (CARDIA)    Difficulty of Paying Living Expenses: Not hard at all  Food Insecurity: No Food Insecurity (07/30/2023)   Hunger Vital Sign    Worried About Running Out of Food in the Last Year: Never true    Ran Out of Food in the Last Year: Never true  Transportation Needs: No Transportation Needs (07/30/2023)   PRAPARE - Administrator, Civil Service (Medical): No    Lack of Transportation (Non-Medical): No  Physical Activity: Inactive (08/06/2023)   Exercise Vital Sign    Days of Exercise per Week: 0 days    Minutes of Exercise per Session: 0 min  Stress: No Stress Concern Present (08/06/2023)   [employer] of Occupational Health - Occupational Stress Questionnaire    Feeling of Stress : Only a little  Social Connections: Moderately Integrated (08/06/2023)   Social Connection and Isolation Panel    Frequency of Communication with Friends and Family: Once a week     Frequency of Social Gatherings with Friends and Family: Three times a week    Attends Religious Services: 1 to 4 times per year    Active Member of Clubs or Organizations: No    Attends Banker Meetings: Never    Marital Status: Married  Catering manager Violence: Not At Risk (07/30/2023)   Humiliation, Afraid, Rape, and Kick questionnaire    Fear of Current or Ex-Partner: No  Emotionally Abused: No    Physically Abused: No    Sexually Abused: No    Review of Systems  All other systems reviewed and are negative.      Objective:   Physical Exam Constitutional:      General: She is not in acute distress.    Appearance: Normal appearance. She is normal weight. She is not ill-appearing, toxic-appearing or diaphoretic.  HENT:     Mouth/Throat:     Mouth: No oral lesions or angioedema.     Dentition: No gingival swelling or gum lesions.     Tongue: No lesions.     Tonsils: No tonsillar exudate or tonsillar abscesses.  Neck:     Thyroid : No thyroid  mass.  Cardiovascular:     Rate and Rhythm: Normal rate and regular rhythm.     Pulses: Normal pulses.     Heart sounds: Normal heart sounds. No murmur heard.    No friction rub. No gallop.  Pulmonary:     Effort: Pulmonary effort is normal. No respiratory distress.     Breath sounds: Normal breath sounds. No stridor.  Abdominal:     General: Bowel sounds are normal. There is no distension.     Palpations: Abdomen is soft.     Tenderness: There is no abdominal tenderness. There is no guarding.  Musculoskeletal:     Cervical back: No pain with movement.  Lymphadenopathy:     Cervical:     Right cervical: No superficial or deep cervical adenopathy.    Left cervical: No superficial or deep cervical adenopathy.  Neurological:     Mental Status: She is alert.     EKG shows normal sinus rhythm at 65 bpm.  PR interval is normal.  There are no dropped heartbeats or evidence of AV nodal dissociation.  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      Assessment & Plan:  Bradycardia - Plan: EKG 12-Lead I believe her bradycardia was secondary to excessive atenolol .  Obtain EKG today to rule out any conduction abnormalities.  Have the patient clinically monitor her symptoms to see if she develops any recurrent palpitations.  If so we may try the patient back on atenolol  albeit at a lower dose such as 25 mg a day

## 2024-08-07 ENCOUNTER — Encounter: Payer: Self-pay | Admitting: Family Medicine

## 2024-08-08 ENCOUNTER — Encounter: Admitting: Family Medicine

## 2024-08-19 ENCOUNTER — Ambulatory Visit (INDEPENDENT_AMBULATORY_CARE_PROVIDER_SITE_OTHER): Admitting: Family Medicine

## 2024-08-19 ENCOUNTER — Encounter: Payer: Self-pay | Admitting: Family Medicine

## 2024-08-19 VITALS — BP 128/62 | HR 64 | Temp 98.1°F | Ht 65.0 in | Wt 188.0 lb

## 2024-08-19 DIAGNOSIS — Z0000 Encounter for general adult medical examination without abnormal findings: Secondary | ICD-10-CM

## 2024-08-19 DIAGNOSIS — Z86018 Personal history of other benign neoplasm: Secondary | ICD-10-CM | POA: Diagnosis not present

## 2024-08-19 DIAGNOSIS — I480 Paroxysmal atrial fibrillation: Secondary | ICD-10-CM

## 2024-08-19 DIAGNOSIS — E7800 Pure hypercholesterolemia, unspecified: Secondary | ICD-10-CM

## 2024-08-19 DIAGNOSIS — M810 Age-related osteoporosis without current pathological fracture: Secondary | ICD-10-CM | POA: Diagnosis not present

## 2024-08-19 DIAGNOSIS — Z853 Personal history of malignant neoplasm of breast: Secondary | ICD-10-CM | POA: Diagnosis not present

## 2024-08-19 DIAGNOSIS — Z0001 Encounter for general adult medical examination with abnormal findings: Secondary | ICD-10-CM | POA: Diagnosis not present

## 2024-08-19 MED ORDER — DILTIAZEM HCL 30 MG PO TABS: ORAL_TABLET | ORAL | 4 refills | Status: AC

## 2024-08-19 MED ORDER — DENOSUMAB 60 MG/ML ~~LOC~~ SOSY: 60.0000 mg | PREFILLED_SYRINGE | Freq: Once | SUBCUTANEOUS | Status: AC

## 2024-08-19 MED ORDER — ATENOLOL 25 MG PO TABS: 25.0000 mg | ORAL_TABLET | Freq: Every day | ORAL | 3 refills | Status: AC

## 2024-08-19 NOTE — Progress Notes (Signed)
 Subjective:    Patient ID: Alexandra Calderon, female    DOB: 1940-10-17, 83 y.o.   MRN: [id number]     Patient is here today for complete physical exam.  She is due for the shingles vaccine.  She is due for the COVID shot.  She is also due for the RSV vaccine.  She declines these today.  Mammogram is up-to-date.  Due to her age, she does not require a Pap smear or colonoscopy.  She has a history of osteoporosis and is currently on Prolia.  She would like to continue Prolia.  Most recent lab work as listed below Lab on 08/02/2024  Component Date Value Ref Range Status   WBC 08/02/2024 4.4  3.8 - 10.8 Thousand/uL Final   RBC 08/02/2024 4.53  3.80 - 5.10 Million/uL Final   Hemoglobin 08/02/2024 13.3  11.7 - 15.5 g/dL Final   HCT 89/78/7974 41.3  35.0 - 45.0 % Final   MCV 08/02/2024 91.2  80.0 - 100.0 fL Final   MCH 08/02/2024 29.4  27.0 - 33.0 pg Final   MCHC 08/02/2024 32.2  32.0 - 36.0 g/dL Final   Comment: For adults, a slight decrease in the calculated MCHC value (in the range of 30 to 32 g/dL) is most likely not clinically significant; however, it should be interpreted with caution in correlation with other red cell parameters and the patient's clinical condition.    RDW 08/02/2024 14.1  11.0 - 15.0 % Final   Platelets 08/02/2024 206  140 - 400 Thousand/uL Final   MPV 08/02/2024 11.1  7.5 - 12.5 fL Final   Neutro Abs 08/02/2024 2,490  1,500 - 7,800 cells/uL Final   Absolute Lymphocytes 08/02/2024 1,399  850 - 3,900 cells/uL Final   Absolute Monocytes 08/02/2024 440  200 - 950 cells/uL Final   Eosinophils Absolute 08/02/2024 48  15 - 500 cells/uL Final   Basophils Absolute 08/02/2024 22  0 - 200 cells/uL Final   Neutrophils Relative % 08/02/2024 56.6  % Final   Total Lymphocyte 08/02/2024 31.8  % Final   Monocytes Relative 08/02/2024 10.0  % Final   Eosinophils Relative 08/02/2024 1.1  % Final   Basophils Relative 08/02/2024 0.5  % Final   Glucose, Bld 08/02/2024 96  65 - 99 mg/dL  Final   Comment: .            Fasting reference interval .    BUN 08/02/2024 15  7 - 25 mg/dL Final   Creat 89/78/7974 0.99 (H)  0.60 - 0.95 mg/dL Final   eGFR 89/78/7974 57 (L)  > OR = 60 mL/min/1.89m2 Final   BUN/Creatinine Ratio 08/02/2024 15  6 - 22 (calc) Final   Sodium 08/02/2024 140  135 - 146 mmol/L Final   Potassium 08/02/2024 4.3  3.5 - 5.3 mmol/L Final   Chloride 08/02/2024 101  98 - 110 mmol/L Final   CO2 08/02/2024 30  20 - 32 mmol/L Final   Calcium  08/02/2024 9.2  8.6 - 10.4 mg/dL Final   Total Protein 89/78/7974 7.1  6.1 - 8.1 g/dL Final   Albumin 89/78/7974 4.2  3.6 - 5.1 g/dL Final   Globulin 89/78/7974 2.9  1.9 - 3.7 g/dL (calc) Final   AG Ratio 08/02/2024 1.4  1.0 - 2.5 (calc) Final   Total Bilirubin 08/02/2024 0.8  0.2 - 1.2 mg/dL Final   Alkaline phosphatase (APISO) 08/02/2024 85  37 - 153 U/L Final   AST 08/02/2024 17  10 - 35 U/L  Final   ALT 08/02/2024 12  6 - 29 U/L Final   TSH 08/02/2024 3.90  0.40 - 4.50 mIU/L Final   Cholesterol 08/02/2024 137  <200 mg/dL Final   HDL 89/78/7974 44 (L)  > OR = 50 mg/dL Final   Triglycerides 89/78/7974 96  <150 mg/dL Final   LDL Cholesterol (Calc) 08/02/2024 75  mg/dL (calc) Final   Comment: Reference range: <100 . Desirable range <100 mg/dL for primary prevention;   <70 mg/dL for patients with CHD or diabetic patients  with > or = 2 CHD risk factors. SABRA LDL-C is now calculated using the Martin-Hopkins  calculation, which is a validated novel method providing  better accuracy than the Friedewald equation in the  estimation of LDL-C.  Gladis APPLETHWAITE et al. SANDREA. 7986;689(80): 2061-2068  (http://education.[facility].com/faq/FAQ164)    Total CHOL/HDL Ratio 08/02/2024 3.1  <4.9 (calc) Final   Non-HDL Cholesterol (Calc) 08/02/2024 93  <130 mg/dL (calc) Final   Comment: For patients with diabetes plus 1 major ASCVD risk  factor, treating to a non-HDL-C goal of <100 mg/dL  (LDL-C of <29 mg/dL) is considered a therapeutic   option.    Patient denies any falls.  She denies any memory loss.  She denies any depression.  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  Her CHA2DS2-VASc 2 score is 4.  I explained to the patient that this gives her a 7% chance of stroke per year.  Essentially this is a 1 and 13 chance.  She initially wanted to stop Eliquis  but after hearing because of the stroke, she elects to continue Eliquis .   Past Medical History:  Diagnosis Date   Allergy    Arthritis    Benign tumor of ear canal    Breast cancer (HCC)    right   Cataract    bilateral   Colon adenomas 2003,2006   Constipation    Diverticulosis    Dysrhythmia    Esophagitis    Fatty liver    GERD (gastroesophageal reflux disease)    Hearing loss    History of kidney stones    Hyperlipidemia    Hypertension    Irritable bowel syndrome 08/27/2012   Kidney stones    Obstructive sleep apnea (adult) (pediatric) 02/03/2023   Osteopenia    Palpitations    Personal history of radiation therapy    Pneumonia    Polyp, stomach    PVC (premature ventricular contraction)    PVC (premature ventricular contraction)    Schwannoma of nerve of head    UTI (lower urinary tract infection)    Past Surgical History:  Procedure Laterality Date   ACOUSTIC NEUROMA RESECTION     BIOPSY  08/06/2023   Procedure: BIOPSY;  Surgeon: Avram Lupita BRAVO, MD;  Location: [facility] ENDOSCOPY;  Service: Gastroenterology;;   BREAST LUMPECTOMY Right 07/01/2011   w/sentinal node bx - right side   COLONOSCOPY W/ BIOPSIES     DILATION AND CURETTAGE OF UTERUS     ESOPHAGOGASTRODUODENOSCOPY     ESOPHAGOGASTRODUODENOSCOPY (EGD) WITH PROPOFOL  N/A 08/06/2023   Procedure: ESOPHAGOGASTRODUODENOSCOPY (EGD) WITH PROPOFOL ;  Surgeon: Avram Lupita BRAVO, MD;  Location: [facility] ENDOSCOPY;  Service: Gastroenterology;  Laterality: N/A;   kidney stones      LAPAROSCOPIC CHOLECYSTECTOMY SINGLE SITE WITH INTRAOPERATIVE CHOLANGIOGRAM N/A 04/25/2021   Procedure: LAPAROSCOPIC CHOLECYSTECTOMY SINGLE SITE WITH INTRAOPERATIVE CHOLANGIOGRAM, POSSIBLE NEEDLE CORE LIVER BX;  Surgeon: Sheldon Standing, MD;  Location: [facility] ORS;  Service: General;  Laterality: N/A;   shoulder arthroscopy rt 2009     SHOULDER SURGERY     Current Outpatient Medications on File Prior to Visit  Medication Sig Dispense Refill   apixaban  (ELIQUIS ) 5 MG TABS tablet Take 1 tablet (5 mg total) by mouth 2 (two) times daily. 180 tablet 1   atenolol  (TENORMIN ) 50 MG tablet TAKE 1 TABLET BY MOUTH TWICE A DAY (Patient taking differently: Take 25 mg by mouth 2 (two) times daily.) 180 tablet 3   Cholecalciferol (VITAMIN D ) 50 MCG (2000 UT) CAPS Take 2,000 Units by mouth daily.     diltiazem  (CARDIZEM ) 30 MG tablet Take 1 tablet every 4 hours AS NEEDED for heart rate >100 30 tablet 4   Docusate Sodium (DSS) 100 MG CAPS Take 1 capsule by mouth daily.     famotidine  (PEPCID ) 40 MG tablet TAKE 1 TABLET BY MOUTH EVERY DAY 90 tablet 1   losartan  (COZAAR ) 25 MG tablet Take 1 tablet (25 mg total) by mouth daily. 90 tablet 3   meclizine  (ANTIVERT ) 25 MG tablet Take 1 tablet (25 mg total) by mouth 3 (three) times daily as needed for dizziness. 30 tablet 0   pantoprazole  (PROTONIX ) 40 MG tablet TAKE 1 TABLET BY MOUTH TWICE A DAY 180 tablet 1   rosuvastatin  (CRESTOR ) 5 MG tablet TAKE 1 TABLET BY MOUTH EVERY DAY 90 tablet 3   cephALEXin  (KEFLEX ) 500 MG capsule Take 1 capsule (500 mg total) by mouth 3 (three) times daily. (Patient not taking: Reported on 08/19/2024) 21 capsule 0   sucralfate  (CARAFATE ) 1 g tablet TAKE 1 TABLET BY MOUTH 2 TIMES DAILY. (Patient not taking: Reported on 08/19/2024) 60 tablet 0   Current Facility-Administered Medications on File Prior to Visit  Medication Dose Route Frequency Provider Last Rate Last Admin   0.9 %  sodium chloride  infusion  500 mL Intravenous Once Avram Lupita BRAVO, MD        Allergies  Allergen Reactions   Gatifloxacin     unknown   Nitrofurantoin  Other (See Comments)    Macrobid    Nitrofurantoin  Macrocrystal     unknown   Azithromycin  Rash   Levofloxacin Rash    REACTION: rash    Sulfamethoxazole -Trimethoprim  Rash and Other (See Comments)    Chills, fever   Sulfonamide Derivatives Rash and Other (See Comments)    Chills, fever   Social History   Socioeconomic History   Marital status: Married    Spouse name: Not on file   Number of children: 2   Years of education: Not on file   Highest education level: Not on file  Occupational History   Occupation: retired  Tobacco Use   Smoking status: Never   Smokeless tobacco: Never   Tobacco comments:    Never smoke 12/25/21  Vaping Use   Vaping status: Never Used  Substance and Sexual Activity   Alcohol use: No   Drug use: No   Sexual activity: Yes  Other Topics Concern   Not on file  Social History Narrative   Spouse:  [name]   Children:   [name]- age 84- [place]   [name]- age 26- [place]   Social Drivers of Health   Financial Resource Strain: Low Risk  (07/30/2023)   Overall Financial Resource Strain (CARDIA)    Difficulty of Paying Living Expenses: Not hard at all  Food Insecurity: No Food Insecurity (07/30/2023)   Hunger Vital Sign  Worried About Programme Researcher, Broadcasting/film/video in the Last Year: Never true    Ran Out of Food in the Last Year: Never true  Transportation Needs: No Transportation Needs (07/30/2023)   PRAPARE - Administrator, Civil Service (Medical): No    Lack of Transportation (Non-Medical): No  Physical Activity: Inactive (08/06/2023)   Exercise Vital Sign    Days of Exercise per Week: 0 days    Minutes of Exercise per Session: 0 min  Stress: No Stress Concern Present (08/06/2023)   [employer] of Occupational Health - Occupational Stress Questionnaire    Feeling of Stress : Only a little  Social Connections: Moderately Integrated  (08/06/2023)   Social Connection and Isolation Panel    Frequency of Communication with Friends and Family: Once a week    Frequency of Social Gatherings with Friends and Family: Three times a week    Attends Religious Services: 1 to 4 times per year    Active Member of Clubs or Organizations: No    Attends Banker Meetings: Never    Marital Status: Married  Catering Manager Violence: Not At Risk (07/30/2023)   Humiliation, Afraid, Rape, and Kick questionnaire    Fear of Current or Ex-Partner: No    Emotionally Abused: No    Physically Abused: No    Sexually Abused: No    Review of Systems  All other systems reviewed and are negative.      Objective:   Physical Exam Constitutional:      General: She is not in acute distress.    Appearance: Normal appearance. She is normal weight. She is not ill-appearing, toxic-appearing or diaphoretic.  HENT:     Mouth/Throat:     Mouth: No oral lesions or angioedema.     Dentition: No gingival swelling or gum lesions.     Tongue: No lesions.     Tonsils: No tonsillar exudate or tonsillar abscesses.  Neck:     Thyroid : No thyroid  mass.  Cardiovascular:     Rate and Rhythm: Normal rate and regular rhythm.     Pulses: Normal pulses.     Heart sounds: Normal heart sounds. No murmur heard.    No friction rub. No gallop.  Pulmonary:     Effort: Pulmonary effort is normal. No respiratory distress.     Breath sounds: Normal breath sounds. No stridor.  Abdominal:     General: Bowel sounds are normal. There is no distension.     Palpations: Abdomen is soft.     Tenderness: There is no abdominal tenderness. There is no guarding.  Musculoskeletal:     Cervical back: Normal range of motion and neck supple. No pain with movement.  Lymphadenopathy:     Cervical:     Right cervical: No superficial or deep cervical adenopathy.    Left cervical: No superficial or deep cervical adenopathy.  Neurological:     Mental Status: She is  alert.           Assessment & Plan:  General medical exam  Personal history of breast cancer  Pure hypercholesterolemia  History of benign schwannoma  Paroxysmal atrial fibrillation (HCC) Patient is in normal sinus rhythm today.  She elects to continue Eliquis  after we discussed the stroke risk.  She declines shingles vaccine, RSV vaccine, COVID shot.  Mammogram is up-to-date.  Patient will continue Prolia for osteoporosis.  She does not require Pap smear for colonoscopy.  Lab work is outstanding.  Regular anticipatory guidance  is provided

## 2024-08-19 NOTE — Addendum Note (Signed)
 Addended by: ANGELENA RONAL BRADLEY K on: 08/19/2024 04:23 PM   Modules accepted: Orders

## 2024-08-22 ENCOUNTER — Other Ambulatory Visit (HOSPITAL_COMMUNITY): Payer: Self-pay

## 2024-08-22 ENCOUNTER — Telehealth: Payer: Self-pay

## 2024-08-22 NOTE — Telephone Encounter (Signed)
 Alexandra Calderon

## 2024-08-22 NOTE — Telephone Encounter (Addendum)
 MEDICAL PA SUBMITTED VIA [employer]. Authorization Number : [id number]   APPROVED-

## 2024-08-22 NOTE — Telephone Encounter (Signed)
 Prolia VOB initiated via [email or web address]  Next Prolia inj DUE: NEW START   PHARMACY COPAY: $169.29

## 2024-08-23 NOTE — Telephone Encounter (Signed)
 Pt ready for scheduling for PROLIA on or after : 08/23/24  Option# 1: Buy/Bill (Office supplied medication)  Out-of-pocket cost due at time of clinic visit: $357  Number of injection/visits approved: 2  Primary: [employer] Prolia co-insurance: 20% Admin fee co-insurance: 20%  Secondary: --- Prolia co-insurance:  Admin fee co-insurance:   Medical Benefit Details: Date Benefits were checked: 08/22/24 Deductible: NO/ Coinsurance: 20%/ Admin Fee: 20%  Prior Auth: APPROVED PA# [id number] Expiration Date: 08/22/24-08/22/25   # of doses approved: 2 ----------------------------------------------------------------------- Option# 2- Med Obtained from pharmacy:  Pharmacy benefit: Copay $169.29 (Paid to pharmacy) Admin Fee: 20% (Pay at clinic)  Prior Auth: N/A PA# Expiration Date:   # of doses approved:   If patient wants fill through the pharmacy benefit please send prescription to: [facility], and include estimated need by date in rx notes. Pharmacy will ship medication directly to the office.  Patient NOT eligible for Prolia Copay Card. Copay Card can make patient's cost as little as $25. Link to apply: [email or web address]://[email or web address]  ** This summary of benefits is an estimation of the patient's out-of-pocket cost. Exact cost may very based on individual plan coverage.

## 2024-08-29 NOTE — Progress Notes (Unsigned)
 Chief Complaint: Discuss hemorrhoids  HPI:    Alexandra Calderon is an 83 year old female, known to Dr. Avram, with a past medical history as listed below including A-fib on Eliquis , osteoporosis on Prolia, who was referred to me by Duanne Butler DASEN, MD for discussion of hemorrhoids.    12/30/2020 colonoscopy with 2 diminutive polyps in the sigmoid colon and transverse colon, external hemorrhoids and otherwise normal.  No repeat due to age.    08/02/2023 EGD done at [facility] for epigastric pain and dyspepsia with multiple gastric polyps and erythematous mucosa in the antrum.    01/21/2024 echo with LVEF 60-65%.    08/02/2024 CMP normal.  CBC and TSH normal.    Today, patient presents to clinic accompanied by her husband.  She explains that over the past month or 2 she has noticed something in my crack, tells me that she just feels like it is there.  It never bleeds, is not painful and does not itch.  She has been stealing her husband's Hydrocortisone prescription ointment and applying it daily for a couple of months here and there and feels like it has maybe shrunk what ever it is down.  Denies any constipation.  She is on a daily stool softener which keeps her regular.  Also describes it is hard to keep this clean even though she uses a wet wipe and toilet paper she then has to shower.  Stools are soft.    Also discusses that she has osteoporosis and was told to use Fosamax  but the last time she did this it increased her heartburn.  Currently on Pantoprazole  40 mg daily and Famotidine  40 mg nightly.  She is thinking about going back on Fosamax  and wants to know what she can do for her heartburn.    Denies fever, chills or weight loss.    Past Medical History:  Diagnosis Date   Allergy    Arthritis    Benign tumor of ear canal    Breast cancer (HCC)    right   Cataract    bilateral   Colon adenomas 2003,2006   Constipation    Diverticulosis    Dysrhythmia    Esophagitis    Fatty liver     GERD (gastroesophageal reflux disease)    Hearing loss    History of kidney stones    Hyperlipidemia    Hypertension    Irritable bowel syndrome 08/27/2012   Kidney stones    Obstructive sleep apnea (adult) (pediatric) 02/03/2023   Osteopenia    Palpitations    Personal history of radiation therapy    Pneumonia    Polyp, stomach    PVC (premature ventricular contraction)    PVC (premature ventricular contraction)    Schwannoma of nerve of head    UTI (lower urinary tract infection)     Past Surgical History:  Procedure Laterality Date   ACOUSTIC NEUROMA RESECTION     BIOPSY  08/06/2023   Procedure: BIOPSY;  Surgeon: Avram Lupita BRAVO, MD;  Location: [facility] ENDOSCOPY;  Service: Gastroenterology;;   BREAST LUMPECTOMY Right 07/01/2011   w/sentinal node bx - right side   COLONOSCOPY W/ BIOPSIES     DILATION AND CURETTAGE OF UTERUS     ESOPHAGOGASTRODUODENOSCOPY     ESOPHAGOGASTRODUODENOSCOPY (EGD) WITH PROPOFOL  N/A 08/06/2023   Procedure: ESOPHAGOGASTRODUODENOSCOPY (EGD) WITH PROPOFOL ;  Surgeon: Avram Lupita BRAVO, MD;  Location: [facility] ENDOSCOPY;  Service: Gastroenterology;  Laterality: N/A;   kidney stones     LAPAROSCOPIC CHOLECYSTECTOMY SINGLE  SITE WITH INTRAOPERATIVE CHOLANGIOGRAM N/A 04/25/2021   Procedure: LAPAROSCOPIC CHOLECYSTECTOMY SINGLE SITE WITH INTRAOPERATIVE CHOLANGIOGRAM, POSSIBLE NEEDLE CORE LIVER BX;  Surgeon: Sheldon Standing, MD;  Location: [facility] ORS;  Service: General;  Laterality: N/A;   shoulder arthroscopy rt 2009     SHOULDER SURGERY      Current Outpatient Medications  Medication Sig Dispense Refill   apixaban  (ELIQUIS ) 5 MG TABS tablet Take 1 tablet (5 mg total) by mouth 2 (two) times daily. 180 tablet 1   atenolol  (TENORMIN ) 25 MG tablet Take 1 tablet (25 mg total) by mouth daily. 90 tablet 3   cephALEXin  (KEFLEX ) 500 MG capsule Take 1 capsule (500 mg total) by mouth 3 (three) times daily. (Patient not taking: Reported on 08/19/2024) 21 capsule 0   Cholecalciferol  (VITAMIN D ) 50 MCG (2000 UT) CAPS Take 2,000 Units by mouth daily.     diltiazem  (CARDIZEM ) 30 MG tablet Take 1 tablet every 4 hours AS NEEDED for heart rate >100 30 tablet 4   Docusate Sodium (DSS) 100 MG CAPS Take 1 capsule by mouth daily.     famotidine  (PEPCID ) 40 MG tablet TAKE 1 TABLET BY MOUTH EVERY DAY 90 tablet 1   losartan  (COZAAR ) 25 MG tablet Take 1 tablet (25 mg total) by mouth daily. 90 tablet 3   meclizine  (ANTIVERT ) 25 MG tablet Take 1 tablet (25 mg total) by mouth 3 (three) times daily as needed for dizziness. 30 tablet 0   pantoprazole  (PROTONIX ) 40 MG tablet TAKE 1 TABLET BY MOUTH TWICE A DAY 180 tablet 1   rosuvastatin  (CRESTOR ) 5 MG tablet TAKE 1 TABLET BY MOUTH EVERY DAY 90 tablet 3   sucralfate  (CARAFATE ) 1 g tablet TAKE 1 TABLET BY MOUTH 2 TIMES DAILY. (Patient not taking: Reported on 08/19/2024) 60 tablet 0   Current Facility-Administered Medications  Medication Dose Route Frequency Provider Last Rate Last Admin   0.9 %  sodium chloride  infusion  500 mL Intravenous Once Avram Lupita BRAVO, MD       [START ON 09/02/2024] denosumab (PROLIA) injection 60 mg  60 mg Subcutaneous Once Duanne Butler DASEN, MD        Allergies as of 08/30/2024 - Review Complete 08/04/2024  Allergen Reaction Noted   Gatifloxacin     Nitrofurantoin  Other (See Comments) 12/08/2023   Nitrofurantoin  macrocrystal     Azithromycin  Rash 09/29/2017   Levofloxacin Rash 12/06/2009   Sulfamethoxazole -trimethoprim  Rash and Other (See Comments) 12/26/2009   Sulfonamide derivatives Rash and Other (See Comments)     Family History  Problem Relation Age of Onset   Colon cancer Mother        early 19's   Lymphoma Father    Hodgkin's lymphoma Father    Diabetes Maternal Aunt    Diabetes Paternal Grandmother    Colon polyps Neg Hx    Rectal cancer Neg Hx    Stomach cancer Neg Hx    Pancreatic cancer Neg Hx    Esophageal cancer Neg Hx    Breast cancer Neg Hx     Social History   Socioeconomic  History   Marital status: Married    Spouse name: Not on file   Number of children: 2   Years of education: Not on file   Highest education level: Not on file  Occupational History   Occupation: retired  Tobacco Use   Smoking status: Never   Smokeless tobacco: Never   Tobacco comments:    Never smoke 12/25/21  Vaping Use  Vaping status: Never Used  Substance and Sexual Activity   Alcohol use: No   Drug use: No   Sexual activity: Yes  Other Topics Concern   Not on file  Social History Narrative   Spouse:  Ryan CHRISTELLA Kerns   Children:   [name]- age 54- [place]   Alm Kerns- age 68- [place]   Social Drivers of Health   Financial Resource Strain: Low Risk  (07/30/2023)   Overall Financial Resource Strain (CARDIA)    Difficulty of Paying Living Expenses: Not hard at all  Food Insecurity: No Food Insecurity (07/30/2023)   Hunger Vital Sign    Worried About Running Out of Food in the Last Year: Never true    Ran Out of Food in the Last Year: Never true  Transportation Needs: No Transportation Needs (07/30/2023)   PRAPARE - Administrator, Civil Service (Medical): No    Lack of Transportation (Non-Medical): No  Physical Activity: Inactive (08/06/2023)   Exercise Vital Sign    Days of Exercise per Week: 0 days    Minutes of Exercise per Session: 0 min  Stress: No Stress Concern Present (08/06/2023)   [employer] of Occupational Health - Occupational Stress Questionnaire    Feeling of Stress : Only a little  Social Connections: Moderately Integrated (08/06/2023)   Social Connection and Isolation Panel    Frequency of Communication with Friends and Family: Once a week    Frequency of Social Gatherings with Friends and Family: Three times a week    Attends Religious Services: 1 to 4 times per year    Active Member of Clubs or Organizations: No    Attends Banker Meetings: Never    Marital Status: Married  Catering Manager Violence: Not At  Risk (07/30/2023)   Humiliation, Afraid, Rape, and Kick questionnaire    Fear of Current or Ex-Partner: No    Emotionally Abused: No    Physically Abused: No    Sexually Abused: No    Review of Systems:    Constitutional: No weight loss, fever or chills Cardiovascular: No chest pain Respiratory: No SOB  Gastrointestinal: See HPI and otherwise negative   Physical Exam:  Vital signs: BP 138/70   Pulse 69   Ht 5' 5 (1.651 m)   Wt 187 lb 3.2 oz (84.9 kg)   LMP  (LMP Unknown)   BMI 31.15 kg/m    Constitutional:   Pleasant elderly Caucasian female appears to be in NAD, Well developed, Well nourished, alert and cooperative Respiratory: Respirations even and unlabored. Lungs clear to auscultation bilaterally.   No wheezes, crackles, or rhonchi.  Cardiovascular: Normal S1, S2. No MRG. Regular rate and rhythm. No peripheral edema, cyanosis or pallor.  Gastrointestinal:  Soft, nondistended, nontender. No rebound or guarding. Normal bowel sounds. No appreciable masses or hepatomegaly. Rectal: External: 4 external hemorrhoid tags, noninflamed, nonthrombosed; internal: No tenderness, no residue; and anoscopy: Grade 1 hemorrhoid and 1 column and otherwise normal Psychiatric: Demonstrates good judgement and reason without abnormal affect or behaviors.  RELEVANT LABS AND IMAGING: CBC    Component Value Date/Time   WBC 4.4 08/02/2024 0906   RBC 4.53 08/02/2024 0906   HGB 13.3 08/02/2024 0906   HGB 13.1 02/12/2016 1316   HCT 41.3 08/02/2024 0906   HCT 39.0 02/12/2016 1316   PLT 206 08/02/2024 0906   PLT 213 02/12/2016 1316   MCV 91.2 08/02/2024 0906   MCV 86.7 02/12/2016 1316   MCH 29.4 08/02/2024 0906  MCHC 32.2 08/02/2024 0906   RDW 14.1 08/02/2024 0906   RDW 13.9 02/12/2016 1316   LYMPHSABS 1,595 06/05/2023 1536   LYMPHSABS 1.9 02/12/2016 1316   MONOABS 0.5 04/29/2017 0800   MONOABS 0.5 02/12/2016 1316   EOSABS 48 08/02/2024 0906   EOSABS 0.1 02/12/2016 1316   BASOSABS 22  08/02/2024 0906   BASOSABS 0.0 02/12/2016 1316    CMP     Component Value Date/Time   NA 140 08/02/2024 0906   NA 142 02/24/2017 1002   NA 143 02/12/2016 1316   K 4.3 08/02/2024 0906   K 3.5 02/12/2016 1316   CL 101 08/02/2024 0906   CL 99 10/07/2012 0859   CO2 30 08/02/2024 0906   CO2 30 (H) 02/12/2016 1316   GLUCOSE 96 08/02/2024 0906   GLUCOSE 95 02/12/2016 1316   GLUCOSE 114 (H) 10/07/2012 0859   BUN 15 08/02/2024 0906   BUN 14 02/24/2017 1002   BUN 14.8 02/12/2016 1316   CREATININE 0.99 (H) 08/02/2024 0906   CREATININE 1.0 02/12/2016 1316   CALCIUM  9.2 08/02/2024 0906   CALCIUM  9.3 02/12/2016 1316   PROT 7.1 08/02/2024 0906   PROT 7.2 02/12/2016 1316   ALBUMIN 3.8 04/29/2017 0800   ALBUMIN 3.7 02/12/2016 1316   AST 17 08/02/2024 0906   AST 18 02/12/2016 1316   ALT 12 08/02/2024 0906   ALT 19 02/12/2016 1316   ALKPHOS 82 04/29/2017 0800   ALKPHOS 108 02/12/2016 1316   BILITOT 0.8 08/02/2024 0906   BILITOT 0.61 02/12/2016 1316   GFRNONAA 47 (L) 05/18/2021 0937   GFRNONAA 57 (L) 03/14/2021 0958   GFRAA 66 03/14/2021 0958    Assessment: 1.  External hemorrhoid tags: Seen at time of exam today 2.  Grade 1 hemorrhoid 3.  Soft stool: Likely adding to difficulty cleaning 4.  Heartburn: After the Fosamax  tab, she discontinued previously for this about restarting it  Plan: 1.  Prescribed Hydrocortisone ointment 2.5% to be applied twice daily for 3 to 4 days, then discontinue use for a week or 2, then use as needed as needed. 2.  Recommend the patient start a fiber supplement such as Metamucil, Citrucel or Benefiber and/or a fiber 1 bar or fiber Gummies if she finds this easier.  This will help bulk her stool so that hopefully it is not as messy to wipe. 3.  Discussed wiping gently with a wet wipe and dabbing dry with toilet paper. 4.  Discussed heartburn, would recommend she take a Pepcid  40 mg after her Fosamax  if it causes burning.  Otherwise could consider the  Prolia shot which has been prescribed for her. 5.  Patient to follow in clinic with us  as needed.  Delon Failing, PA-C [facility] 08/29/2024, 3:09 PM  Cc: Duanne Butler DASEN, MD

## 2024-08-30 ENCOUNTER — Encounter: Payer: Self-pay | Admitting: Physician Assistant

## 2024-08-30 ENCOUNTER — Ambulatory Visit: Admitting: Physician Assistant

## 2024-08-30 VITALS — BP 138/70 | HR 69 | Ht 65.0 in | Wt 187.2 lb

## 2024-08-30 DIAGNOSIS — R195 Other fecal abnormalities: Secondary | ICD-10-CM

## 2024-08-30 DIAGNOSIS — K644 Residual hemorrhoidal skin tags: Secondary | ICD-10-CM

## 2024-08-30 DIAGNOSIS — K640 First degree hemorrhoids: Secondary | ICD-10-CM

## 2024-08-30 DIAGNOSIS — R12 Heartburn: Secondary | ICD-10-CM

## 2024-08-30 MED ORDER — HYDROCORTISONE (PERIANAL) 2.5 % EX CREA: 1.0000 | TOPICAL_CREAM | Freq: Two times a day (BID) | CUTANEOUS | 1 refills | Status: AC

## 2024-08-30 NOTE — Patient Instructions (Signed)
 We have sent the following medications to your pharmacy for you to pick up at your convenience: Hydrocortisone Cream 2.5 % cream twice daily for 7 days.   Pepcid  40 mg after taking Fosamax  , if needed.   _______________________________________________________  If your blood pressure at your visit was 140/90 or greater, please contact your primary care physician to follow up on this.  _______________________________________________________  If you are age 14 or older, your body mass index should be between 23-30. Your Body mass index is 31.15 kg/m. If this is out of the aforementioned range listed, please consider follow up with your Primary Care Provider.  If you are age 67 or younger, your body mass index should be between 19-25. Your Body mass index is 31.15 kg/m. If this is out of the aformentioned range listed, please consider follow up with your Primary Care Provider.   ________________________________________________________  The [facility] providers would like to encourage you to use MYCHART to communicate with providers for non-urgent requests or questions.  Due to long hold times on the telephone, sending your provider a message by [facility] may be a faster and more efficient way to get a response.  Please allow 48 business hours for a response.  Please remember that this is for non-urgent requests.  _______________________________________________________  [facility] is using a team-based approach to care.  Your team is made up of your doctor and two to three APPS. Our APPS (Nurse Practitioners and Physician Assistants) work with your physician to ensure care continuity for you. They are fully qualified to address your health concerns and develop a treatment plan. They communicate directly with your gastroenterologist to care for you. Seeing the Advanced Practice Practitioners on your physician's team can help you by facilitating care more promptly, often allowing for  earlier appointments, access to diagnostic testing, procedures, and other specialty referrals.   Due to recent changes in healthcare laws, you may see the results of your imaging and laboratory studies on MyChart before your provider has had a chance to review them.  We understand that in some cases there may be results that are confusing or concerning to you. Not all laboratory results come back in the same time frame and the provider may be waiting for multiple results in order to interpret others.  Please give us  48 hours in order for your provider to thoroughly review all the results before contacting the office for clarification of your results.   Thank you for choosing me and [facility].  Delon Failing, PA-C

## 2024-09-15 DIAGNOSIS — Z961 Presence of intraocular lens: Secondary | ICD-10-CM | POA: Diagnosis not present

## 2024-09-15 DIAGNOSIS — H43821 Vitreomacular adhesion, right eye: Secondary | ICD-10-CM | POA: Diagnosis not present

## 2024-09-15 DIAGNOSIS — H524 Presbyopia: Secondary | ICD-10-CM | POA: Diagnosis not present

## 2024-09-15 DIAGNOSIS — H5203 Hypermetropia, bilateral: Secondary | ICD-10-CM | POA: Diagnosis not present

## 2024-09-15 DIAGNOSIS — G514 Facial myokymia: Secondary | ICD-10-CM | POA: Diagnosis not present

## 2024-09-15 DIAGNOSIS — H52223 Regular astigmatism, bilateral: Secondary | ICD-10-CM | POA: Diagnosis not present

## 2024-09-15 DIAGNOSIS — H35371 Puckering of macula, right eye: Secondary | ICD-10-CM | POA: Diagnosis not present

## 2024-09-21 DIAGNOSIS — Z0100 Encounter for examination of eyes and vision without abnormal findings: Secondary | ICD-10-CM | POA: Diagnosis not present

## 2024-10-04 ENCOUNTER — Other Ambulatory Visit: Payer: Self-pay | Admitting: Family Medicine

## 2024-10-12 ENCOUNTER — Other Ambulatory Visit: Payer: Self-pay

## 2024-10-12 MED ORDER — ROSUVASTATIN CALCIUM 5 MG PO TABS: 5.0000 mg | ORAL_TABLET | Freq: Every day | ORAL | 3 refills | Status: AC

## 2024-10-19 ENCOUNTER — Encounter: Payer: Self-pay | Admitting: Internal Medicine

## 2024-11-10 ENCOUNTER — Other Ambulatory Visit (HOSPITAL_COMMUNITY): Payer: Self-pay

## 2024-11-10 NOTE — Telephone Encounter (Addendum)
 Prolia  VOB initiated via [email or web address]  Next Prolia  inj DUE: NEW START   PHARMACY PROLIA : $908.30 STOBOCLO: $837.78

## 2024-11-10 NOTE — Telephone Encounter (Signed)
 SABRA

## 2024-11-10 NOTE — Telephone Encounter (Signed)
 Pt ready for scheduling for PROLIA  on or after : 11/10/24  Option# 1: Buy/Bill (Office supplied medication)  Out-of-pocket cost due at time of clinic visit: $362  Number of injection/visits approved: 2  Primary: HU[employer] Prolia  co-insurance: 20% Admin fee co-insurance: $10  Secondary: --- Prolia  co-insurance:  Admin fee co-insurance:   Medical Benefit Details: Date Benefits were checked: 11/10/24 Deductible: NO/ Coinsurance: 20%/ Admin Fee: $10  Prior Auth: APPROVED PA# [id number] Expiration Date: 11/10/24-10/12/25  # of doses approved: 2 ----------------------------------------------------------------------- Option# 2- Med Obtained from pharmacy:  Pharmacy benefit: Copay $908.30 (Paid to pharmacy) Admin Fee: $10 (Pay at clinic)  Prior Auth: N/A PA# Expiration Date:   # of doses approved:   If patient wants fill through the pharmacy benefit please send prescription to: [facility], and include estimated need by date in rx notes. Pharmacy will ship medication directly to the office.  Patient NOT eligible for Prolia  Copay Card. Copay Card can make patient's cost as little as $25. Link to apply: [email or web address]://[email or web address]  ** This summary of benefits is an estimation of the patient's out-of-pocket cost. Exact cost may very based on individual plan coverage.

## 2024-11-10 NOTE — Telephone Encounter (Addendum)
 MEDICAL PA SUBMITTED VIA LATENT. KEY: [id number]

## 2024-11-11 ENCOUNTER — Encounter: Payer: Self-pay | Admitting: Family Medicine

## 2024-11-11 ENCOUNTER — Ambulatory Visit: Admitting: Family Medicine

## 2024-11-11 VITALS — BP 132/68 | HR 75 | Temp 97.9°F | Ht 65.0 in | Wt 186.9 lb

## 2024-11-11 DIAGNOSIS — R300 Dysuria: Secondary | ICD-10-CM | POA: Diagnosis not present

## 2024-11-11 DIAGNOSIS — R8290 Unspecified abnormal findings in urine: Secondary | ICD-10-CM

## 2024-11-11 LAB — URINALYSIS, ROUTINE W REFLEX MICROSCOPIC
Bilirubin Urine: NEGATIVE
Ketones, ur: NEGATIVE
Nitrite: NEGATIVE
Specific Gravity, Urine: 1.025 (ref 1.001–1.035)
pH: 6 (ref 5.0–8.0)

## 2024-11-11 LAB — MICROSCOPIC MESSAGE

## 2024-11-11 MED ORDER — CIPROFLOXACIN HCL 500 MG PO TABS: 500.0000 mg | ORAL_TABLET | Freq: Two times a day (BID) | ORAL | 0 refills | Status: AC

## 2024-11-14 LAB — URINE CULTURE
MICRO NUMBER:: 17532214
SPECIMEN QUALITY:: ADEQUATE

## 2024-11-15 ENCOUNTER — Ambulatory Visit: Payer: Self-pay | Admitting: Family Medicine

## 2024-11-16 ENCOUNTER — Other Ambulatory Visit: Payer: Self-pay

## 2024-11-16 DIAGNOSIS — I10 Essential (primary) hypertension: Secondary | ICD-10-CM

## 2024-11-18 ENCOUNTER — Other Ambulatory Visit: Payer: Self-pay | Admitting: Physician Assistant

## 2024-11-18 DIAGNOSIS — I10 Essential (primary) hypertension: Secondary | ICD-10-CM

## 2024-11-18 MED ORDER — LOSARTAN POTASSIUM 25 MG PO TABS: 25.0000 mg | ORAL_TABLET | Freq: Every day | ORAL | 0 refills | Status: AC

## 2025-01-02 ENCOUNTER — Ambulatory Visit: Admitting: Internal Medicine
